# Patient Record
Sex: Male | Born: 1965
Health system: Southern US, Community
[De-identification: ages and names within clinical notes are randomized; demographics above are authoritative.]

## PROBLEM LIST (undated history)

## (undated) DIAGNOSIS — G4730 Sleep apnea, unspecified: Secondary | ICD-10-CM

## (undated) DIAGNOSIS — I509 Heart failure, unspecified: Secondary | ICD-10-CM

## (undated) DIAGNOSIS — M1990 Unspecified osteoarthritis, unspecified site: Secondary | ICD-10-CM

## (undated) DIAGNOSIS — G4733 Obstructive sleep apnea (adult) (pediatric): Secondary | ICD-10-CM

## (undated) DIAGNOSIS — I5020 Unspecified systolic (congestive) heart failure: Secondary | ICD-10-CM

## (undated) DIAGNOSIS — I10 Essential (primary) hypertension: Secondary | ICD-10-CM

## (undated) DIAGNOSIS — E119 Type 2 diabetes mellitus without complications: Secondary | ICD-10-CM

## (undated) DIAGNOSIS — E785 Hyperlipidemia, unspecified: Secondary | ICD-10-CM

## (undated) DIAGNOSIS — E876 Hypokalemia: Secondary | ICD-10-CM

## (undated) DIAGNOSIS — E669 Obesity, unspecified: Secondary | ICD-10-CM

## (undated) HISTORY — DX: Heart failure, unspecified: I50.9

## (undated) HISTORY — DX: Obstructive sleep apnea (adult) (pediatric): G47.33

## (undated) HISTORY — DX: Unspecified systolic (congestive) heart failure: I50.20

## (undated) HISTORY — DX: Essential (primary) hypertension: I10

## (undated) HISTORY — DX: Hypokalemia: E87.6

## (undated) HISTORY — DX: Type 2 diabetes mellitus without complications: E11.9

## (undated) HISTORY — PX: OTHER SURGICAL HISTORY: SHX169

## (undated) HISTORY — DX: Sleep apnea, unspecified: G47.30

## (undated) HISTORY — DX: Hyperlipidemia, unspecified: E78.5

## (undated) HISTORY — PX: UVULOPALATOPHARYNGOPLASTY: SHX827

## (undated) HISTORY — PX: TONSILLECTOMY: SUR1361

## (undated) HISTORY — DX: Obesity, unspecified: E66.9

---

## 1998-04-01 ENCOUNTER — Emergency Department (HOSPITAL_COMMUNITY): Admission: EM | Admit: 1998-04-01 | Discharge: 1998-04-01 | Payer: Self-pay | Admitting: Emergency Medicine

## 1998-09-20 ENCOUNTER — Emergency Department (HOSPITAL_COMMUNITY): Admission: EM | Admit: 1998-09-20 | Discharge: 1998-09-20 | Payer: Self-pay | Admitting: *Deleted

## 1998-09-21 ENCOUNTER — Inpatient Hospital Stay (HOSPITAL_COMMUNITY): Admission: EM | Admit: 1998-09-21 | Discharge: 1998-09-23 | Payer: Self-pay | Admitting: Emergency Medicine

## 1998-09-21 ENCOUNTER — Encounter: Payer: Self-pay | Admitting: *Deleted

## 1999-06-29 ENCOUNTER — Emergency Department (HOSPITAL_COMMUNITY): Admission: EM | Admit: 1999-06-29 | Discharge: 1999-06-30 | Payer: Self-pay

## 2000-11-13 ENCOUNTER — Encounter: Payer: Self-pay | Admitting: Emergency Medicine

## 2000-11-13 ENCOUNTER — Emergency Department (HOSPITAL_COMMUNITY): Admission: EM | Admit: 2000-11-13 | Discharge: 2000-11-13 | Payer: Self-pay | Admitting: Emergency Medicine

## 2000-12-06 ENCOUNTER — Encounter: Admission: RE | Admit: 2000-12-06 | Discharge: 2000-12-06 | Payer: Self-pay | Admitting: Internal Medicine

## 2004-07-20 ENCOUNTER — Ambulatory Visit: Payer: Self-pay | Admitting: Internal Medicine

## 2004-08-03 ENCOUNTER — Ambulatory Visit: Payer: Self-pay

## 2004-08-12 ENCOUNTER — Ambulatory Visit: Payer: Self-pay | Admitting: Pulmonary Disease

## 2004-08-18 ENCOUNTER — Ambulatory Visit (HOSPITAL_BASED_OUTPATIENT_CLINIC_OR_DEPARTMENT_OTHER): Admission: RE | Admit: 2004-08-18 | Discharge: 2004-08-18 | Payer: Self-pay | Admitting: Pulmonary Disease

## 2004-08-18 ENCOUNTER — Encounter: Payer: Self-pay | Admitting: Pulmonary Disease

## 2004-08-24 ENCOUNTER — Encounter: Payer: Self-pay | Admitting: Pulmonary Disease

## 2004-08-26 ENCOUNTER — Ambulatory Visit: Payer: Self-pay

## 2004-09-02 ENCOUNTER — Ambulatory Visit: Payer: Self-pay | Admitting: Pulmonary Disease

## 2004-09-10 ENCOUNTER — Ambulatory Visit: Payer: Self-pay | Admitting: Pulmonary Disease

## 2005-12-02 ENCOUNTER — Ambulatory Visit: Payer: Self-pay | Admitting: Internal Medicine

## 2005-12-07 ENCOUNTER — Ambulatory Visit: Payer: Self-pay | Admitting: Internal Medicine

## 2006-01-11 ENCOUNTER — Ambulatory Visit: Payer: Self-pay | Admitting: Internal Medicine

## 2007-06-25 ENCOUNTER — Emergency Department (HOSPITAL_COMMUNITY): Admission: EM | Admit: 2007-06-25 | Discharge: 2007-06-25 | Payer: Self-pay | Admitting: Family Medicine

## 2008-10-03 ENCOUNTER — Ambulatory Visit: Payer: Self-pay | Admitting: Cardiology

## 2008-10-03 ENCOUNTER — Encounter: Payer: Self-pay | Admitting: Cardiovascular Disease

## 2008-10-03 ENCOUNTER — Inpatient Hospital Stay (HOSPITAL_COMMUNITY): Admission: EM | Admit: 2008-10-03 | Discharge: 2008-10-08 | Payer: Self-pay | Admitting: Emergency Medicine

## 2008-10-03 DIAGNOSIS — I639 Cerebral infarction, unspecified: Secondary | ICD-10-CM

## 2008-10-03 HISTORY — DX: Cerebral infarction, unspecified: I63.9

## 2008-10-04 ENCOUNTER — Encounter (INDEPENDENT_AMBULATORY_CARE_PROVIDER_SITE_OTHER): Payer: Self-pay | Admitting: Internal Medicine

## 2008-10-08 ENCOUNTER — Encounter: Payer: Self-pay | Admitting: Cardiology

## 2008-10-08 HISTORY — PX: CARDIAC CATHETERIZATION: SHX172

## 2008-10-16 ENCOUNTER — Ambulatory Visit: Payer: Self-pay | Admitting: Internal Medicine

## 2008-10-16 DIAGNOSIS — I5022 Chronic systolic (congestive) heart failure: Secondary | ICD-10-CM | POA: Insufficient documentation

## 2008-10-17 LAB — CONVERTED CEMR LAB
CO2: 29 meq/L (ref 19–32)
Glucose, Bld: 106 mg/dL — ABNORMAL HIGH (ref 70–99)
Potassium: 3.7 meq/L (ref 3.5–5.1)
Sodium: 141 meq/L (ref 135–145)

## 2008-10-22 DIAGNOSIS — I428 Other cardiomyopathies: Secondary | ICD-10-CM | POA: Insufficient documentation

## 2008-10-22 DIAGNOSIS — I10 Essential (primary) hypertension: Secondary | ICD-10-CM | POA: Insufficient documentation

## 2008-10-22 DIAGNOSIS — E876 Hypokalemia: Secondary | ICD-10-CM | POA: Insufficient documentation

## 2008-10-22 DIAGNOSIS — I509 Heart failure, unspecified: Secondary | ICD-10-CM | POA: Insufficient documentation

## 2008-10-22 DIAGNOSIS — E669 Obesity, unspecified: Secondary | ICD-10-CM | POA: Insufficient documentation

## 2008-10-22 DIAGNOSIS — G4733 Obstructive sleep apnea (adult) (pediatric): Secondary | ICD-10-CM | POA: Insufficient documentation

## 2008-10-23 ENCOUNTER — Ambulatory Visit: Payer: Self-pay | Admitting: Cardiology

## 2008-10-31 ENCOUNTER — Telehealth: Payer: Self-pay | Admitting: Internal Medicine

## 2008-10-31 ENCOUNTER — Encounter: Payer: Self-pay | Admitting: Internal Medicine

## 2008-12-13 ENCOUNTER — Ambulatory Visit: Payer: Self-pay | Admitting: Internal Medicine

## 2008-12-16 LAB — CONVERTED CEMR LAB
BUN: 15 mg/dL (ref 6–23)
CO2: 27 meq/L (ref 19–32)
Chloride: 107 meq/L (ref 96–112)
Creatinine, Ser: 1.19 mg/dL (ref 0.40–1.50)
Pro B Natriuretic peptide (BNP): 74 pg/mL (ref 0.0–100.0)

## 2008-12-20 ENCOUNTER — Ambulatory Visit: Payer: Self-pay | Admitting: Internal Medicine

## 2008-12-23 LAB — CONVERTED CEMR LAB
CO2: 22 meq/L (ref 19–32)
Chloride: 106 meq/L (ref 96–112)
Creatinine, Ser: 1.06 mg/dL (ref 0.40–1.50)
Potassium: 3.6 meq/L (ref 3.5–5.3)
Sodium: 139 meq/L (ref 135–145)

## 2009-02-12 ENCOUNTER — Ambulatory Visit: Payer: Self-pay | Admitting: Internal Medicine

## 2009-02-12 ENCOUNTER — Ambulatory Visit (HOSPITAL_COMMUNITY): Admission: RE | Admit: 2009-02-12 | Discharge: 2009-02-12 | Payer: Self-pay | Admitting: Internal Medicine

## 2009-02-12 ENCOUNTER — Encounter: Payer: Self-pay | Admitting: Internal Medicine

## 2009-02-12 ENCOUNTER — Ambulatory Visit: Payer: Self-pay

## 2009-03-06 ENCOUNTER — Telehealth: Payer: Self-pay | Admitting: Internal Medicine

## 2009-03-21 ENCOUNTER — Telehealth: Payer: Self-pay | Admitting: Internal Medicine

## 2009-04-01 ENCOUNTER — Telehealth: Payer: Self-pay | Admitting: Internal Medicine

## 2009-04-14 ENCOUNTER — Telehealth: Payer: Self-pay | Admitting: Internal Medicine

## 2009-04-19 ENCOUNTER — Encounter: Payer: Self-pay | Admitting: Internal Medicine

## 2009-04-29 ENCOUNTER — Ambulatory Visit: Payer: Self-pay | Admitting: Internal Medicine

## 2009-04-29 ENCOUNTER — Ambulatory Visit (HOSPITAL_BASED_OUTPATIENT_CLINIC_OR_DEPARTMENT_OTHER): Admission: RE | Admit: 2009-04-29 | Discharge: 2009-04-29 | Payer: Self-pay | Admitting: Internal Medicine

## 2009-04-29 ENCOUNTER — Encounter: Payer: Self-pay | Admitting: Pulmonary Disease

## 2009-04-29 DIAGNOSIS — K219 Gastro-esophageal reflux disease without esophagitis: Secondary | ICD-10-CM | POA: Insufficient documentation

## 2009-05-07 ENCOUNTER — Ambulatory Visit: Payer: Self-pay | Admitting: Pulmonary Disease

## 2009-05-14 ENCOUNTER — Telehealth (INDEPENDENT_AMBULATORY_CARE_PROVIDER_SITE_OTHER): Payer: Self-pay | Admitting: *Deleted

## 2009-05-21 ENCOUNTER — Ambulatory Visit: Payer: Self-pay | Admitting: Pulmonary Disease

## 2009-05-27 ENCOUNTER — Ambulatory Visit: Payer: Self-pay | Admitting: Internal Medicine

## 2009-05-30 LAB — CONVERTED CEMR LAB
BUN: 13 mg/dL (ref 6–23)
CO2: 29 meq/L (ref 19–32)
Chloride: 105 meq/L (ref 96–112)
Creatinine, Ser: 1.2 mg/dL (ref 0.4–1.5)
Glucose, Bld: 97 mg/dL (ref 70–99)
Potassium: 3.8 meq/L (ref 3.5–5.1)

## 2009-06-03 ENCOUNTER — Encounter: Payer: Self-pay | Admitting: Pulmonary Disease

## 2009-06-11 ENCOUNTER — Ambulatory Visit: Payer: Self-pay | Admitting: Pulmonary Disease

## 2009-06-17 ENCOUNTER — Telehealth: Payer: Self-pay | Admitting: Internal Medicine

## 2009-06-21 ENCOUNTER — Encounter: Payer: Self-pay | Admitting: Internal Medicine

## 2009-07-07 ENCOUNTER — Encounter: Payer: Self-pay | Admitting: Pulmonary Disease

## 2009-07-11 ENCOUNTER — Encounter: Payer: Self-pay | Admitting: Internal Medicine

## 2009-07-22 ENCOUNTER — Encounter: Payer: Self-pay | Admitting: Internal Medicine

## 2009-07-23 ENCOUNTER — Ambulatory Visit: Payer: Self-pay | Admitting: Internal Medicine

## 2009-07-23 DIAGNOSIS — M79609 Pain in unspecified limb: Secondary | ICD-10-CM | POA: Insufficient documentation

## 2009-07-28 ENCOUNTER — Telehealth: Payer: Self-pay | Admitting: Internal Medicine

## 2009-07-28 LAB — CONVERTED CEMR LAB
BUN: 9 mg/dL (ref 6–23)
CO2: 29 meq/L (ref 19–32)
Calcium: 8.8 mg/dL (ref 8.4–10.5)
GFR calc non Af Amer: 93.63 mL/min (ref >60)
Glucose, Bld: 97 mg/dL (ref 70–99)
Sodium: 140 meq/L (ref 135–145)

## 2009-07-31 ENCOUNTER — Telehealth: Payer: Self-pay | Admitting: Internal Medicine

## 2009-07-31 ENCOUNTER — Encounter: Payer: Self-pay | Admitting: Internal Medicine

## 2009-08-01 ENCOUNTER — Ambulatory Visit (HOSPITAL_COMMUNITY): Admission: RE | Admit: 2009-08-01 | Discharge: 2009-08-02 | Payer: Self-pay | Admitting: Otolaryngology

## 2009-08-01 ENCOUNTER — Encounter (INDEPENDENT_AMBULATORY_CARE_PROVIDER_SITE_OTHER): Payer: Self-pay | Admitting: Otolaryngology

## 2009-08-08 ENCOUNTER — Encounter: Payer: Self-pay | Admitting: Internal Medicine

## 2009-08-11 ENCOUNTER — Telehealth: Payer: Self-pay | Admitting: Internal Medicine

## 2009-09-10 ENCOUNTER — Ambulatory Visit: Payer: Self-pay | Admitting: Internal Medicine

## 2009-09-22 ENCOUNTER — Encounter: Payer: Self-pay | Admitting: Internal Medicine

## 2009-09-30 ENCOUNTER — Telehealth: Payer: Self-pay | Admitting: Internal Medicine

## 2009-10-09 ENCOUNTER — Telehealth: Payer: Self-pay | Admitting: Internal Medicine

## 2009-10-21 ENCOUNTER — Encounter: Payer: Self-pay | Admitting: Internal Medicine

## 2009-10-22 ENCOUNTER — Encounter: Payer: Self-pay | Admitting: Internal Medicine

## 2009-10-28 ENCOUNTER — Observation Stay (HOSPITAL_COMMUNITY): Admission: EM | Admit: 2009-10-28 | Discharge: 2009-10-28 | Payer: Self-pay | Admitting: Emergency Medicine

## 2009-11-10 ENCOUNTER — Telehealth: Payer: Self-pay | Admitting: Internal Medicine

## 2009-11-13 ENCOUNTER — Telehealth: Payer: Self-pay | Admitting: Pulmonary Disease

## 2009-11-26 ENCOUNTER — Ambulatory Visit: Payer: Self-pay | Admitting: Internal Medicine

## 2009-11-26 ENCOUNTER — Encounter: Payer: Self-pay | Admitting: Pulmonary Disease

## 2009-11-26 DIAGNOSIS — E782 Mixed hyperlipidemia: Secondary | ICD-10-CM | POA: Insufficient documentation

## 2009-11-28 LAB — CONVERTED CEMR LAB
ALT: 31 units/L (ref 0–53)
AST: 25 units/L (ref 0–37)
Albumin: 4.2 g/dL (ref 3.5–5.2)
BUN: 16 mg/dL (ref 6–23)
Calcium: 9.8 mg/dL (ref 8.4–10.5)
Chloride: 102 meq/L (ref 96–112)
Cholesterol: 155 mg/dL (ref 0–200)
Creatinine, Ser: 1 mg/dL (ref 0.4–1.5)
Pro B Natriuretic peptide (BNP): 8.6 pg/mL (ref 0.0–100.0)
Triglycerides: 185 mg/dL — ABNORMAL HIGH (ref 0.0–149.0)
VLDL: 37 mg/dL (ref 0.0–40.0)

## 2009-12-03 ENCOUNTER — Telehealth (INDEPENDENT_AMBULATORY_CARE_PROVIDER_SITE_OTHER): Payer: Self-pay | Admitting: *Deleted

## 2009-12-16 ENCOUNTER — Telehealth (INDEPENDENT_AMBULATORY_CARE_PROVIDER_SITE_OTHER): Payer: Self-pay | Admitting: *Deleted

## 2009-12-16 ENCOUNTER — Encounter: Payer: Self-pay | Admitting: Pulmonary Disease

## 2009-12-31 ENCOUNTER — Ambulatory Visit: Payer: Self-pay

## 2009-12-31 ENCOUNTER — Ambulatory Visit (HOSPITAL_COMMUNITY): Admission: RE | Admit: 2009-12-31 | Discharge: 2009-12-31 | Payer: Self-pay | Admitting: Internal Medicine

## 2009-12-31 ENCOUNTER — Ambulatory Visit: Payer: Self-pay | Admitting: Pulmonary Disease

## 2009-12-31 ENCOUNTER — Ambulatory Visit: Payer: Self-pay | Admitting: Cardiology

## 2009-12-31 ENCOUNTER — Telehealth (INDEPENDENT_AMBULATORY_CARE_PROVIDER_SITE_OTHER): Payer: Self-pay | Admitting: *Deleted

## 2010-01-12 ENCOUNTER — Encounter: Payer: Self-pay | Admitting: Pulmonary Disease

## 2010-01-14 ENCOUNTER — Ambulatory Visit: Payer: Self-pay | Admitting: Pulmonary Disease

## 2010-03-11 ENCOUNTER — Ambulatory Visit: Payer: Self-pay | Admitting: Internal Medicine

## 2010-03-11 ENCOUNTER — Encounter: Payer: Self-pay | Admitting: Internal Medicine

## 2010-03-16 ENCOUNTER — Encounter: Payer: Self-pay | Admitting: Internal Medicine

## 2010-03-17 ENCOUNTER — Telehealth: Payer: Self-pay | Admitting: Internal Medicine

## 2010-03-19 ENCOUNTER — Encounter: Payer: Self-pay | Admitting: Internal Medicine

## 2010-05-05 NOTE — Consult Note (Signed)
Summary: [facility]   Imported By: Sherian Rein 05/21/2009 13:23:46  _____________________________________________________________________  External Attachment:    Type:   Image     Comment:   External Document

## 2010-05-05 NOTE — Progress Notes (Signed)
Summary: [facility]  Physician's Handwritten Notes/[facility]   Imported By: Sherian Rein 05/21/2009 13:30:33  _____________________________________________________________________  External Attachment:    Type:   Image     Comment:   External Document

## 2010-05-05 NOTE — Assessment & Plan Note (Signed)
Summary: rov for osa   Copy to:  Heron Sabins Primary Provider/Referring Provider:  none  CC:  Pt is here for a f/u appt.  Pt states he went and saw Dr. Annalee Genta 06-03-2009.  Pt states he is currently wearing his cpap machine every night.  Approx 6 hours per night.  Pt denied any complaints with  mask or pressue.  Marland Kitchen  History of Present Illness: The pt comes in today for f/u of his osa.  He is wearing cpap compliantly since the last visit, but dislikes it.  He does feel the new pressure setting is better.  He reports no mask leaks, and no issues with pressure.  He has seen Dr. Annalee Genta, and is going to get surgery scheduled.  Medications Prior to Update: 1)  Klor-Con M10 10 Meq Cr-Tabs (Potassium Chloride Crys Cr) .... Take 1 Tablet By Mouth Once A Day 2)  Lisinopril 40 Mg Tabs (Lisinopril) .... Take One Tablet Twice Daily 3)  Carvedilol 25 Mg Tabs (Carvedilol) .... Take One Tablet By Mouth Twice A Day 4)  Plavix 75 Mg Tabs (Clopidogrel Bisulfate) .... Take One Daily 5)  Simvastatin 40 Mg Tabs (Simvastatin) .... Take One At Bedtime 6)  Aspir-Low 81 Mg Tbec (Aspirin) .... Take One Daily 7)  Furosemide 40 Mg Tabs (Furosemide) .... Take One Tablet Daily 8)  Bidil 20-37.5 Mg Tabs (Isosorb Dinitrate-Hydralazine) .... Take 2 Tabs Three Times A Day 9)  Spironolactone 25 Mg Tabs (Spironolactone) .... Take 2 Tabs By Mouth Daily 10)  Protonix 40 Mg Tbec (Pantoprazole Sodium) .... Take 1 Tablet By Mouth Once A Day 11)  Amlodipine Besylate 2.5 Mg Tabs (Amlodipine Besylate) .... Take One Tablet By Mouth Daily  Allergies (verified): No Known Drug Allergies  Review of Systems      See HPI  Vital Signs:  Patient profile:   45 year old male Height:      69 inches Weight:      234 pounds BMI:     34.68 O2 Sat:      97 % on Room air Temp:     98.7 degrees F oral Pulse rate:   83 / minute BP sitting:   134 / 82  (right arm) Cuff size:   large  Vitals Entered By: Arman Filter LPN  (June 11, 2009 10:49 AM)  O2 Flow:  Room air CC: Pt is here for a f/u appt.  Pt states he went and saw Dr. Annalee Genta 06-03-2009.  Pt states he is currently wearing his cpap machine every night.  Approx 6 hours per night.  Pt denied any complaints with  mask or pressue.   Comments Medications reviewed with patient Arman Filter LPN  June 12, 2438 10:51 AM    Physical Exam  General:  ow male in nad Nose:  no skin breakdown or pressure necrosis from the cpap mask   Impression & Recommendations:  Problem # 1:  OBSTRUCTIVE SLEEP APNEA (ICD-327.23) the pt is doing well on cpap, but wants to consider surgery for treatment due to impact on lifestyle/QOL.  He will continue on cpap until this is done.  I have also reminded him of the important role of weight loss as well.  Other Orders: Est. Patient Level II (10272)  Patient Instructions: 1)  continue on cpap until your surgery. 2)  once given the ok by Dr. Annalee Genta, will set up for a screening study at home to see if you still have sleep apnea.

## 2010-05-05 NOTE — Letter (Signed)
Summary: missed echo appt.  Architectural technologist, Main Office  [address]   [place], [place] [address]   Phone: [phone]  Fax: [phone]        October 21, 2009 MRN: [id number]    Caleb Garcia [address] [place], [place]  [address]    Dear [name],  Our records indicate that you missed your appointment for your echocardiogram.  It is Dr Bensimhon's recommendation that you have this test done.  Please call our office at [phone] to reschedule.    Sincerely,    [name], RN Arvilla Meres, MD This letter has been electronically signed by your physician.

## 2010-05-05 NOTE — Progress Notes (Signed)
   Phone Note Other Incoming   Summary of Call: Per Olegario Messier from [facility], pt need to be stop plavix today due to surgery on 4/29. [phone]. pls call patient home # [phone]  Lorne Skeens  July 29, 2009 3:06 PM [facility] for pt to call back Katina Dung, RN, BSN   July 29, 2009 3:14 PM    Follow-up for Phone Call        ok Dolores Patty, MD, [facility]  Aug 04, 2009 5:34 PM  pt held plavix and has now restarted [name], RN  Aug 06, 2009 5:34 PM

## 2010-05-05 NOTE — Assessment & Plan Note (Signed)
Summary: per check out/sf      Allergies Added: NKDA  Visit Type:  Follow-up Referring Provider:  Heron Sabins Primary Provider:  none  CC:  no complaints.  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 Cardiac catheterization on October 08, 2008 showed no significant coronary artery disease with moderately severe global LV dysfunction. Ejection fraction 40% with moderate MR.  We last saw him in June 2011. Looked good. Echo orderd to re-assess LV function but he missed appt.   Returns for f/u. No CP, orthopnea or PND. Feels like he can do anything he wants.  Weight up seven pounds here but says weight at home only up 2 pounds. Compliant with meds.   Dyspepsia History:      There is a prior history of GERD.    New Orders:     1)  TLB-BMP (Basic Metabolic Panel-BMET) (80048-METABOL)     2)  TLB-BNP (B-Natriuretic Peptide) (83880-BNPR)     3)  TLB-Hepatic/Liver Function Pnl (80076-HEPATIC)     4)  TLB-Lipid Panel (80061-LIPID)     5)  TLB-A1C / Hgb A1C (Glycohemoglobin) (83036-A1C)     6)  Echocardiogram (Echo)   Current Medications (verified): 1)  Klor-Con M10 10 Meq Cr-Tabs (Potassium Chloride Crys Cr) .... Take 1 Tablet By Mouth Once A Day 2)  Lisinopril 40 Mg Tabs (Lisinopril) .... Take One Tablet Twice Daily 3)  Carvedilol 25 Mg Tabs (Carvedilol) .... Take One Tablet By Mouth Twice A Day 4)  Plavix 75 Mg Tabs (Clopidogrel Bisulfate) .... Take One Daily 5)  Simvastatin 40 Mg Tabs (Simvastatin) .... Take One At Bedtime 6)  Aspir-Low 81 Mg Tbec (Aspirin) .... Take One Daily 7)  Furosemide 40 Mg Tabs (Furosemide) .... Take One Tablet Daily 8)  Bidil 20-37.5 Mg Tabs (Isosorb Dinitrate-Hydralazine) .... Take 2 Tabs Three Times A Day 9)  Spironolactone 25 Mg Tabs (Spironolactone) .... Take 2 Tabs  By Mouth Daily 10)  Amlodipine Besylate 2.5 Mg Tabs (Amlodipine Besylate) .... Take One Tablet By Mouth Daily  Allergies (verified): No Known Drug Allergies  Review of Systems       As per HPI and past medical history; otherwise all systems negative.   Vital Signs:  Patient profile:   45 year old male Height:      69 inches Weight:      226 pounds BMI:     33.50 Pulse rate:   60 / minute BP sitting:   122 / 72  (left arm) Cuff size:   regular  Vitals Entered By: [name], RMA (November 26, 2009 10:06 AM)  Physical Exam  General:  well appearing. no resp difficulty HEENT: normal Neck: supple. no obvious JVD. Carotids 2+ bilat; no bruits. No lymphadenopathy or thryomegaly appreciated. Cor: PMI nondisplaced. Regular rate & rhythm. No rubs, murmur. +s4 Lungs: clear Abdomen: soft, nontender, nondistended. No hepatosplenomegaly. No bruits or masses. Good bowel sounds. Extremities: no cyanosis, clubbing, rash. no edema.  Neuro: alert & orientedx3, cranial nerves grossly intact. moves all 4 extremities w/o difficulty. affect pleasant      Impression & Recommendations:  Problem # 1:  CONGESTIVE HEART FAILURE, MILD (ICD-428.0) Volume status looks good. NYHA class I-II. Continue current regimen. Recheck echo.   Problem # 2:  HYPERTENSION (ICD-401.9) Blood pressure well controlled. Continue current regimen.  Problem # 3:  HYPERLIPIDEMIA-MIXED (ICD-272.4) Recheck  lipids and liver panel.   Other Orders: TLB-BMP (Basic Metabolic Panel-BMET) (80048-METABOL) TLB-BNP (B-Natriuretic Peptide) (83880-BNPR) TLB-Hepatic/Liver Function Pnl (80076-HEPATIC) TLB-Lipid Panel (80061-LIPID) TLB-A1C / Hgb A1C (Glycohemoglobin) (83036-A1C) Echocardiogram (Echo)   Patient Instructions: 1)  Labs today 2)  Your physician has requested that you have an echocardiogram.  Echocardiography is a painless test that uses sound waves to create images of your heart. It provides your doctor with  information about the size and shape of your heart and how well your heart's chambers and valves are working.  This procedure takes approximately one hour. There are no restrictions for this procedure. 3)  Follow up in 4 months

## 2010-05-05 NOTE — Assessment & Plan Note (Signed)
Summary: home sleep testing shows ahi 8/hr.   Copy to:  Shoemaker Primary Provider/Referring Provider:  none   History of Present Illness: the pt has undergone home sleep testing with a type 3 device for evaluation of sleep disordered breathing. I have reviewed study data and tracings with the following findings:  1) flow and oxygen saturation period of approximately 5 hours 2) the pt had 18 apneas with one being central in nature, along with 17 obstructive hypopneas.  This     gave him an AHI of 8/hr. 3) low oxygen saturation was 78% transiently, but did have 42 min less than or equal to 88%.  Allergies: No Known Drug Allergies   Impression & Recommendations:  Problem # 1:  OBSTRUCTIVE SLEEP APNEA (ICD-327.23)  the pt has very mild osa by this sleep study.  Will discuss treatment options with him.  Other Orders: Sleep Std Airflow/Heartrate and O2 SAT unattended (19147)  Appended Document: home sleep testing shows ahi 8/hr. please let pt know that he has very mild osa on his most recent home study.  This is no longer a health risk for him, and he can make the decision of staying off cpap and working on weight loss vs going back on cpap if he feels he sleeps better with the device.  I would like to see him  back in 6mos.  Appended Document: home sleep testing shows ahi 8/hr. called and spoke with pt and informed him of the results. pt verbally stated he understood the results and had no questions. pt is aware a reminder was put in the computer for his 6 month follow up.

## 2010-05-05 NOTE — Cardiovascular Report (Signed)
Summary: Heart Failure Program Summary Report  Heart Failure Program Summary Report   Imported By: Roderic Ovens 08/21/2009 12:01:37  _____________________________________________________________________  External Attachment:    Type:   Image     Comment:   External Document

## 2010-05-05 NOTE — Medication Information (Signed)
Summary: Prescription for Durable Medical Equipment   Prescription for Durable Medical Equipment   Imported By: Roderic Ovens 07/31/2009 16:33:54  _____________________________________________________________________  External Attachment:    Type:   Image     Comment:   External Document

## 2010-05-05 NOTE — Progress Notes (Signed)
Summary: refill   Phone Note Refill Request Message from:  Patient on November 10, 2009 2:50 PM  Refills Requested: Medication #1:  BIDIL 20-37.5 MG TABS take 2 tabs three times a day  Medication #2:  CARVEDILOL 25 MG TABS Take one tablet by mouth twice a day  Medication #3:  LISINOPRIL 40 MG TABS take one tablet twice daily  Medication #4:  FUROSEMIDE 40 MG TABS take one tablet daily [facility] [address]  Initial call taken by: Judie Grieve,  November 10, 2009 2:51 PM    Prescriptions: FUROSEMIDE 40 MG TABS (FUROSEMIDE) take one tablet daily  #30 x 11   Entered by:   [name], RMA   Authorized by:   Dolores Patty, MD, [facility]   Signed by:   [name], RMA on 11/11/2009   Method used:   Electronically to        [facility] (retail)       [address]       [place], [place]  [address]       Ph: [phone]       Fax: [phone]   RxID:   [id number] LISINOPRIL 40 MG TABS (LISINOPRIL) take one tablet twice daily  #60 x 11   Entered by:   [name], RMA   Authorized by:   Dolores Patty, MD, [facility]   Signed by:   [name], RMA on 11/11/2009   Method used:   Electronically to        [facility] (retail)       [address]       [place], [place]  [address]       Ph: [phone]       Fax: [phone]   RxID:   [id number] CARVEDILOL 25 MG TABS (CARVEDILOL) Take one tablet by mouth twice a day  #60 x 11   Entered by:   [name], RMA   Authorized by:   Dolores Patty, MD, [facility]   Signed by:   [name], RMA on 11/11/2009   Method used:   Electronically to        [facility] (retail)       [address]       [place], [place]  [address]       Ph: [phone]       Fax: [phone]   RxID:   [id number]

## 2010-05-05 NOTE — Progress Notes (Signed)
Summary: refill   Phone Note Refill Request   Refills Requested: Medication #1:  SIMVASTATIN 40 MG TABS take one at bedtime   Supply Requested: 3 months [facility] [address]   Method Requested: Fax to Local Pharmacy Initial call taken by: Migdalia Dk,  April 14, 2009 2:39 PM  Follow-up for Phone Call        Rx faxed to pharmacy Follow-up by: Vikki Ports,  April 14, 2009 3:08 PM    Prescriptions: SIMVASTATIN 40 MG TABS (SIMVASTATIN) take one at bedtime  #90 x 0   Entered by:   Vikki Ports   Authorized by:   Dolores Patty, MD, [facility]   Signed by:   Vikki Ports on 04/14/2009   Method used:   F[facility]ed to ...       [facility] ([facility])       [address]       [place], [place]  [address]       Ph: [phone]       Fax: [phone]   RxID:   [id number]

## 2010-05-05 NOTE — Assessment & Plan Note (Signed)
Summary: per check out/sf  Medications Added SPIRONOLACTONE 25 MG TABS (SPIRONOLACTONE) Take 2 tabs by mouth daily PROTONIX 40 MG TBEC (PANTOPRAZOLE SODIUM) Take 1 tablet by mouth once a day      Allergies Added: NKDA  Visit Type:  Follow-up  CC:  chest pain.  History of Present Illness: Caleb Garcia is a 45 year old, African American male, patient with history of nonischemic cardiomyopathy (probably hypertensive). He presented to the emergency room with chest pain, and hypertensive emergency in July 2010. Cardiac enzymes were positive and he was felt to have a non-ST elevation MI. Cardiac catheterization on October 08, 2008 showed no significant coronary artery disease with moderately severe global LV dysfunction. Ejection fraction 40% with moderate MR. Medical therapy was recommended for continued nonischemic cardiomyopathy most likely hypertensive. While in hospital he suffered an acute left parietal occipital stroke with mild blurriness in the right eye. TEE was performed and showed no evidence of thrombus or vegetation and treatment with Plavix was recommended.  Had bad episode of GERD last night. Overall doing well. Denies dyspnea, orthopnea or PND. Can do anything he wants to do. Occasional minimal CP. Weight stable. Wanting to use CPAP but company wanting to switch out machine. Taking Bidil without headaches. No palpitations.   Having sleep study tonight. Has not check BP in some time   Current Medications (verified): 1)  Klor-Con M10 10 Meq Cr-Tabs (Potassium Chloride Crys Cr) .... Take 1 Tablet By Mouth Once A Day 2)  Lisinopril 40 Mg Tabs (Lisinopril) .... Take One Tablet Twice Daily 3)  Carvedilol 25 Mg Tabs (Carvedilol) .... Take One Tablet By Mouth Twice A Day 4)  Plavix 75 Mg Tabs (Clopidogrel Bisulfate) .... Take One Daily 5)  Simvastatin 40 Mg Tabs (Simvastatin) .... Take One At Bedtime 6)  Aspir-Low 81 Mg Tbec (Aspirin) .... Take One Daily 7)  Furosemide 40 Mg Tabs (Furosemide)  .... Take One Tablet Daily 8)  Bidil 20-37.5 Mg Tabs (Isosorb Dinitrate-Hydralazine) .... Take 2 Tabs Three Times A Day 9)  Spironolactone 25 Mg Tabs (Spironolactone) .... Take One Tablet By Mouth Daily  Allergies (verified): No Known Drug Allergies  Past History:  Past Medical History: HYPOKALEMIA (ICD-276.8) CONGESTIVE HEART FAILURE, MILD (ICD-428.0)    --nonischemic EF 40%. cors normal on cath 7/10   --echo 11/10 EF 35-40% GLUCOSE INTOLERANCE, HX OF (ICD-V12.2) CARDIOMYOPATHY (ICD-425.4) OBESITY (ICD-278.00) OBSTRUCTIVE SLEEP APNEA (ICD-327.23) HYPERTENSION (ICD-401.9), severe CHRONIC SYSTOLIC HEART FAILURE (ICD-428.22)  Review of Systems       As per HPI and past medical history; otherwise all systems negative.   Vital Signs:  Patient profile:   45 year old male Height:      69 inches Weight:      235 pounds BMI:     34.83 Pulse rate:   68 / minute BP sitting:   150 / 95  (left arm) Cuff size:   regular  Vitals Entered By: Burnett Kanaris, CNA (April 29, 2009 10:27 AM)  Physical Exam  General:  Gen: well appearing. no resp difficulty HEENT: normal Neck: supple. no JVD. Carotids 2+ bilat; no bruits. No lymphadenopathy or thryomegaly appreciated. Cor: PMI nondisplaced. Regular rate & rhythm. No rubs, murmur. +s4 Lungs: clear Abdomen: soft, nontender, nondistended. No hepatosplenomegaly. No bruits or masses. Good bowel sounds. Extremities: no cyanosis, clubbing, rash, edema Neuro: alert & orientedx3, cranial nerves grossly intact. moves all 4 extremities w/o difficulty. affect pleasant    Impression & Recommendations:  Problem # 1:  CONGESTIVE HEART FAILURE, MILD (ICD-428.0)  Volume status looks good. NYHA class I-II.Continue current regimen with increase of spironolactone to 50 once daily due to HTN. Await sleep study. EF > 35% so not ICD candidate at this point.   Problem # 2:  HYPERTENSION (ICD-401.9) BP elevated. Increase spironlactone to 50 once daily.  Check BMET next week. May need norvasc.  Problem # 3:  GERD (ICD-530.81)  Start protonix 40 once daily.   His updated medication list for this problem includes:    Protonix 40 Mg Tbec (Pantoprazole sodium) .[facility]... Take 1 tablet by mouth once a day  Patient Instructions: 1)  Increase Spironolactone to 25mg  2 tabs once daily  2)  Labs in 1 week 428.22--bmet and bnp 3)  Follow up in 1 month Prescriptions: PROTONIX 40 MG TBEC (PANTOPRAZOLE SODIUM) Take 1 tablet by mouth once a day  #30 x 6   Entered by:   [name], RN   Authorized by:   Dolores Patty, MD, [facility]   Signed by:   [name], RN on 04/29/2009   Method used:   Electronically to        [facility] (retail)       [address]       [place], [place]  [address]       Ph: [phone]       Fax: [phone]   RxID:   [id number] SPIRONOLACTONE 25 MG TABS (SPIRONOLACTONE) Take 2 tabs by mouth daily  #60 x 6   Entered by:   [name], RN   Authorized by:   Dolores Patty, MD, [facility]   Signed by:   [name], RN on 04/29/2009   Method used:   Electronically to        [facility] (retail)       [address]       [place], [place]  [address]       Ph: [phone]       Fax: [phone]   RxID:   [id number] FUROSEMIDE 40 MG TABS (FUROSEMIDE) take one tablet daily  #30 x 12   Entered by:   [name], RN   Authorized by:   Dolores Patty, MD, [facility]   Signed by:   [name], RN on 04/29/2009   Method used:   Electronically to        [facility] (retail)       [address]       [place], [place]  [address]       Ph: [phone]       Fax: [phone]   RxID:   [id number] CARVEDILOL 25 MG TABS (CARVEDILOL) Take one tablet by mouth twice a day  #60 x 6   Entered by:   [name], RN   Authorized by:   Dolores Patty, MD, [facility]   Signed by:   [name], RN on 04/29/2009    Method used:   Electronically to        [facility] (retail)       [address]       [place], [place]  [address]       Ph: [phone]       Fax: [phone]   RxID:   [id number] LISINOPRIL 40 MG  TABS (LISINOPRIL) take one tablet twice daily  #60 x 6   Entered by:   [name], RN   Authorized by:   Dolores Patty, MD, [facility]   Signed by:   [name], RN on 04/29/2009   Method used:   Electronically to        [facility] (retail)       [address]       [place], [place]  [address]       Ph: [phone]       Fax: [phone]   RxID:   [id number]   Prevention & Chronic Care Immunizations   Influenza vaccine: Not documented    Tetanus booster: Not documented    Pneumococcal vaccine: Not documented  Other Screening   Smoking status: Not documented  Lipids   Total Cholesterol: Not documented   LDL: Not documented   LDL Direct: Not documented   HDL: Not documented   Triglycerides: Not documented  Hypertension   Last Blood Pressure: 150 / 95  (04/29/2009)   Serum creatinine: 1.06  (12/20/2008)   Serum potassium 3.6  (12/20/2008)  Self-Management Support :    Hypertension self-management support: Not documented

## 2010-05-05 NOTE — Cardiovascular Report (Signed)
Summary: Heart Failure Program Summary Report   Heart Failure Program Summary Report   Imported By: Roderic Ovens 12/25/2009 16:22:19  _____________________________________________________________________  External Attachment:    Type:   Image     Comment:   External Document

## 2010-05-05 NOTE — Cardiovascular Report (Signed)
Summary: Heart Failure Program Summary Report  Heart Failure Program Summary Report   Imported By: Roderic Ovens 07/22/2009 11:13:35  _____________________________________________________________________  External Attachment:    Type:   Image     Comment:   External Document

## 2010-05-05 NOTE — Cardiovascular Report (Signed)
Summary: Heart Failure Program Status Report  Heart Failure Program Status Report   Imported By: Roderic Ovens 07/18/2009 13:52:33  _____________________________________________________________________  External Attachment:    Type:   Image     Comment:   External Document

## 2010-05-05 NOTE — Assessment & Plan Note (Signed)
Summary: 2 MONTH ROV./SL      Allergies Added: NKDA  Visit Type:  Follow-up Referring Provider:  Heron Sabins Primary Provider:  none  CC:  leg pain.  History of Present Illness: Caleb Garcia is a 45 year old, African American male, patient with history of nonischemic cardiomyopathy (probably hypertensive) and previous occiptial stroke. He presented to the emergency room with chest pain, and hypertensive emergency in July 2010. Cardiac enzymes were positive and he was felt to have a non-ST elevation MI. Cardiac catheterization on October 08, 2008 showed no significant coronary artery disease with moderately severe global LV dysfunction. Ejection fraction 40% with moderate MR.  From HF standpoint doing well. Since Saturday has been having pain in his left leg. Starts in his hip and has pain and burning down entire outside of leg into calf. Feels numb. Not weak. Comes and goes. No back pain.   Breathing ok. No CP. Scheduled for UPPP not Friday for OSA. No orthopnea or PND. No edema. Scale at home weight up 4 or 5 pounds.  Current Medications (verified): 1)  Klor-Con M10 10 Meq Cr-Tabs (Potassium Chloride Crys Cr) .... Take 1 Tablet By Mouth Once A Day 2)  Lisinopril 40 Mg Tabs (Lisinopril) .... Take One Tablet Twice Daily 3)  Carvedilol 25 Mg Tabs (Carvedilol) .... Take One Tablet By Mouth Twice A Day 4)  Plavix 75 Mg Tabs (Clopidogrel Bisulfate) .... Take One Daily 5)  Simvastatin 40 Mg Tabs (Simvastatin) .... Take One At Bedtime 6)  Aspir-Low 81 Mg Tbec (Aspirin) .... Take One Daily 7)  Furosemide 40 Mg Tabs (Furosemide) .... Take One Tablet Daily 8)  Bidil 20-37.5 Mg Tabs (Isosorb Dinitrate-Hydralazine) .... Take 2 Tabs Three Times A Day 9)  Spironolactone 25 Mg Tabs (Spironolactone) .... Take 2 Tabs By Mouth Daily 10)  Protonix 40 Mg Tbec (Pantoprazole Sodium) .... Take 1 Tablet By Mouth Once A Day 11)  Amlodipine Besylate 2.5 Mg Tabs (Amlodipine Besylate) .... Take One Tablet  By Mouth Daily  Allergies (verified): No Known Drug Allergies  Past History:  Past Medical History: Last updated: 04/29/2009 HYPOKALEMIA (ICD-276.8) CONGESTIVE HEART FAILURE, MILD (ICD-428.0)    --nonischemic EF 40%. cors normal on cath 7/10   --echo 11/10 EF 35-40% GLUCOSE INTOLERANCE, HX OF (ICD-V12.2) CARDIOMYOPATHY (ICD-425.4) OBESITY (ICD-278.00) OBSTRUCTIVE SLEEP APNEA (ICD-327.23) HYPERTENSION (ICD-401.9), severe CHRONIC SYSTOLIC HEART FAILURE (ICD-428.22)  Review of Systems       As per HPI and past medical history; otherwise all systems negative.   Vital Signs:  Patient profile:   45 year old male Height:      69 inches Weight:      230 pounds BMI:     34.09 Pulse rate:   80 / minute BP sitting:   126 / 84  (left arm) Cuff size:   large  Vitals Entered By: [name], RMA (July 23, 2009 9:45 AM)  Physical Exam  General:  General:  Gen: well appearing. no resp difficulty HEENT: normal Neck: supple. no obvious JVD. Carotids 2+ bilat; no bruits. No lymphadenopathy or thryomegaly appreciated. Cor: PMI nondisplaced. Regular rate & rhythm. No rubs, murmur. +s4 Lungs: clear Abdomen: soft, nontender, nondistended. No hepatosplenomegaly. No bruits or masses. Good bowel sounds. Extremities: no cyanosis, clubbing, rash. tr edema. negative SLR bilaterally. no hot joints Neuro: alert & orientedx3, cranial nerves grossly intact. moves all 4 extremities w/o difficulty. affect pleasant      Impression & Recommendations:  Problem # 1:  CHRONIC SYSTOLIC HEART FAILURE (ICD-428.22) Overall  doing very well. Has some very mild volume overload. Take extra dose lasix/kcl for 2 days. Continue current regiment otherwise. Check BMET and BNP.  Problem # 2:  LIMB PAIN (ICD-729.5) I suspect this may be sciatica though not classic for it. Will check electrolytes and see if KCL ok. if symptoms persist refer to ortho.  Problem # 3:  HYPERTENSION (ICD-401.9) Much improved  with addition of amlodipine. Continue current regimen.   Other Orders: TLB-BMP (Basic Metabolic Panel-BMET) (80048-METABOL)  Patient Instructions: 1)  Labs today 2)  Follow up in 2 months

## 2010-05-05 NOTE — Consult Note (Signed)
Summary: [facility]  [facility]   Imported By: Lester Delco 06/16/2009 10:13:11  _____________________________________________________________________  External Attachment:    Type:   Image     Comment:   External Document

## 2010-05-05 NOTE — Progress Notes (Signed)
Summary: cpap supplies fax  Phone Note Other Incoming   Caller: [facility]-[phone] ext [phone] heather Summary of Call: looking for a fax that was sent in july cpap supplies Initial call taken by: Lacinda Axon,  November 13, 2009 1:02 PM  Follow-up for Phone Call        Aundra Millet, have you seen any forms on this pt for [facility] to sign? Pls advise, if not I will call back and have them refax Caleb Garcia  November 13, 2009 2:24 PM  received CMN and put in [facility] very important look at folder. Arman Filter LPN  November 13, 2009 2:45 PM    Additional Follow-up for Phone Call Additional follow up Details #1::        noted Additional Follow-up by: Barbaraann Share MD,  November 13, 2009 5:26 PM

## 2010-05-05 NOTE — Progress Notes (Signed)
Summary: home sleep study   [id number]  Phone Note From Other Clinic Call back at [phone] ex [phone]   Caller: [facility] ( [facility]) Call For: clance Summary of Call: calling about home sleep study. Initial call taken by: Rickard Patience,  December 31, 2009 4:45 PM  Follow-up for Phone Call        [facility].  Pt was just seen by [facility] today and order was sent to [facility] for apnea link. Unsure what they are needing.  Aundra Millet Reynolds LPN  December 31, 2009 4:52 PM   [facility] returned call asking for Almyra Free stating "this is a STAT case and needs to be addressed by the end of the business day". [phone] ex [phone]. Zackery Barefoot CMA  January 01, 2010 10:11 AM   Additional Follow-up for Phone Call Additional follow up Details #1::        faxed info to [facility] to get auth for home sleep study  Additional Follow-up by: Oneita Jolly,  January 01, 2010 4:17 PM

## 2010-05-05 NOTE — Progress Notes (Signed)
Summary: refill request   Phone Note Refill Request Message from:  Patient on October 09, 2009 9:14 AM  Refills Requested: Medication #1:  CARVEDILOL 25 MG TABS Take one tablet by mouth twice a day  Medication #2:  LISINOPRIL 40 MG TABS take one tablet twice daily  Medication #3:  AMLODIPINE BESYLATE 2.5 MG TABS Take one tablet by mouth daily.  Medication #4:  FUROSEMIDE 40 MG TABS take one tablet daily [facility]   Method Requested: Telephone to Pharmacy Initial call taken by: Glynda Jaeger,  October 09, 2009 9:17 AM    Prescriptions: AMLODIPINE BESYLATE 2.5 MG TABS (AMLODIPINE BESYLATE) Take one tablet by mouth daily  #30 x 11   Entered by:   [name], RMA   Authorized by:   Dolores Patty, MD, [facility]   Signed by:   [name], RMA on 10/09/2009   Method used:   Electronically to        [facility] (retail)       [address]       [place], [place]  [address]       Ph: [phone]       Fax: [phone]   RxID:   [id number] FUROSEMIDE 40 MG TABS (FUROSEMIDE) take one tablet daily  #30 x 11   Entered by:   [name], RMA   Authorized by:   Dolores Patty, MD, [facility]   Signed by:   [name], RMA on 10/09/2009   Method used:   Electronically to        [facility] (retail)       [address]       [place], [place]  [address]       Ph: [phone]       Fax: [phone]   RxID:   [id number] CARVEDILOL 25 MG TABS (CARVEDILOL) Take one tablet by mouth twice a day  #60 x 11   Entered by:   [name], RMA   Authorized by:   Dolores Patty, MD, [facility]   Signed by:   [name], RMA on 10/09/2009   Method used:   Electronically to        [facility] (retail)       [address]       [place], [place]  [address]       Ph: [phone]       Fax: [phone]   RxID:   [id number] LISINOPRIL 40 MG TABS (LISINOPRIL) take one tablet  twice daily  #60 x 11   Entered by:   [name], RMA   Authorized by:   Dolores Patty, MD, [facility]   Signed by:   [name], RMA on 10/09/2009   Method used:   Electronically to        [facility] (retail)       [address]       [place], [place]  [address]       Ph: [phone]       Fax: [phone]   RxID:   [id number]

## 2010-05-05 NOTE — Assessment & Plan Note (Signed)
Summary: rov for osa   Copy to:  [place] Primary Provider/Referring Provider:  none  CC:  Follow up. Pt states he has not used his cpap since he had his surgery which was on 08/01/09. Pt states he is not having any problems or concerns..  History of Present Illness: the pt comes in today for f/u of his moderate osa.  He is s/p nasal surgery and UP3 with ENT back in the early summer, and feels that he is sleeping much better since.  He states that his wife has not heard snoring, he denies fragmented sleep, and feels that he is rested in the am's upon arising.  He denies daytime sleepiness issues.  His weight has been stable since last visit.  Current Medications (verified): 1)  Klor-Con M10 10 Meq Cr-Tabs (Potassium Chloride Crys Cr) .... Take 1 Tablet By Mouth Once A Day 2)  Lisinopril 40 Mg Tabs (Lisinopril) .... Take One Tablet Twice Daily 3)  Carvedilol 25 Mg Tabs (Carvedilol) .... Take One Tablet By Mouth Twice A Day 4)  Plavix 75 Mg Tabs (Clopidogrel Bisulfate) .... Take One Daily 5)  Simvastatin 40 Mg Tabs (Simvastatin) .... Take One At Bedtime 6)  Aspir-Low 81 Mg Tbec (Aspirin) .... Take One Daily 7)  Furosemide 40 Mg Tabs (Furosemide) .... Take One Tablet Daily 8)  Bidil 20-37.5 Mg Tabs (Isosorb Dinitrate-Hydralazine) .... Take 2 Tabs Three Times A Day 9)  Spironolactone 25 Mg Tabs (Spironolactone) .... Take 2 Tabs By Mouth Daily 10)  Amlodipine Besylate 2.5 Mg Tabs (Amlodipine Besylate) .... Take One Tablet By Mouth Daily  Allergies (verified): No Known Drug Allergies  Review of Systems  The patient denies shortness of breath with activity, shortness of breath at rest, productive cough, non-productive cough, coughing up blood, chest pain, irregular heartbeats, acid heartburn, indigestion, loss of appetite, weight change, abdominal pain, difficulty swallowing, sore throat, tooth/dental problems, headaches, nasal congestion/difficulty breathing through nose, sneezing, itching,  anxiety, depression, hand/feet swelling, joint stiffness or pain, rash, change in color of mucus, and fever.    Vital Signs:  Patient profile:   45 year old male Height:      69 inches Weight:      230 pounds BMI:     34.09 O2 Sat:      99 % on Room air Temp:     98.1 degrees F oral Pulse rate:   73 / minute BP sitting:   112 / 68  (left arm) Cuff size:   regular  Vitals Entered By: Carver Fila (December 31, 2009 10:16 AM)  O2 Flow:  Room air CC: Follow up. Pt states he has not used his cpap since he had his surgery which was on 08/01/09. Pt states he is not having any problems or concerns. Comments meds and allergies updated Phone number updated Carver Fila  December 31, 2009 10:16 AM    Physical Exam  General:  ow male in nad Nose:  patent, but left mildly congested. Mouth:  trimmed palate and uvula Extremities:  no edema or cyanosis  Neurologic:  alert, not sleepy, moves all 4.   Impression & Recommendations:  Problem # 1:  OBSTRUCTIVE SLEEP APNEA (ICD-327.23) The pt has a h/o moderate osa with poor cpap tolerance.  He has had surgery this past summer, and feels that he has done well off cpap.  He denies having issues with snoring, disrupted sleep, or daytime hypersomnia.  Will need to document if he still has osa, and to what degree.  He is a perfect candidate for a home sleep study.  I have also asked the pt to work on weight loss.  Other Orders: Est. Patient Level III ([address]) Misc. Referral (Misc. Ref)  Patient Instructions: 1)  continue to work on weight loss 2)  will set up for a home sleep study to see if the surgery has adequately treated your sleep apnea.  Wil call you with results.   Immunization History:  Influenza Immunization History:    Influenza:  historical (01/03/2009)  Pneumovax Immunization History:    Pneumovax:  historical (01/03/2002)

## 2010-05-05 NOTE — Assessment & Plan Note (Signed)
Summary: 1 month rov/sl  Medications Added AMLODIPINE BESYLATE 2.5 MG TABS (AMLODIPINE BESYLATE) Take one tablet by mouth daily      Allergies Added: NKDA  Referring Provider:  Heron Sabins Primary Provider:  none  CC:  no complaints.  History of Present Illness: Caleb Garcia is a 45 year old, African American male, patient with history of nonischemic cardiomyopathy (probably hypertensive). He presented to the emergency room with chest pain, and hypertensive emergency in July 2010. Cardiac enzymes were positive and he was felt to have a non-ST elevation MI. Cardiac catheterization on October 08, 2008 showed no significant coronary artery diseas e with moderately severe global LV dysfunction. Ejection fraction 40% with moderate MR. Medical therapy was recommended for continued nonischemic cardiomyopathy most likely hypertensive. While in hospital he suffered an acute left parietal occipital stroke with mild blurriness in the right eye. TEE was performed and showed no evidence of thrombus or vegetation and treatment with Plavix was recommended.  We saw him recently and his BP was elevated. Spironolactone increased to 50 once daily. Feels good otherwise. No orthopnea, PND or edema. Taking all medications as directed.  Had sleep study recently which was +. Doesn't want CPAP so working on weight loss and getting referred to ENT fo possible UPPP.  Current Medications (verified): 1)  Klor-Con M10 10 Meq Cr-Tabs (Potassium Chloride Crys Cr) .... Take 1 Tablet By Mouth Once A Day 2)  Lisinopril 40 Mg Tabs (Lisinopril) .... Take One Tablet Twice Daily 3)  Carvedilol 25 Mg Tabs (Carvedilol) .... Take One Tablet By Mouth Twice A Day 4)  Plavix 75 Mg Tabs (Clopidogrel Bisulfate) .... Take One Daily 5)  Simvastatin 40 Mg Tabs (Simvastatin) .... Take One At Bedtime 6)  Aspir-Low 81 Mg Tbec (Aspirin) .... Take One Daily 7)  Furosemide 40 Mg Tabs (Furosemide) .... Take One Tablet Daily 8)  Bidil  20-37.5 Mg Tabs (Isosorb Dinitrate-Hydralazine) .... Take 2 Tabs Three Times A Day 9)  Spironolactone 25 Mg Tabs (Spironolactone) .... Take 2 Tabs By Mouth Daily 10)  Protonix 40 Mg Tbec (Pantoprazole Sodium) .... Take 1 Tablet By Mouth Once A Day  Allergies (verified): No Known Drug Allergies  Past History:  Past Medical History: Last updated: 04/29/2009 HYPOKALEMIA (ICD-276.8) CONGESTIVE HEART FAILURE, MILD (ICD-428.0)    --nonischemic EF 40%. cors normal on cath 7/10   --echo 11/10 EF 35-40% GLUCOSE INTOLERANCE, HX OF (ICD-V12.2) CARDIOMYOPATHY (ICD-425.4) OBESITY (ICD-278.00) OBSTRUCTIVE SLEEP APNEA (ICD-327.23) HYPERTENSION (ICD-401.9), severe CHRONIC SYSTOLIC HEART FAILURE (ICD-428.22)  Review of Systems       As per HPI and past medical history; otherwise all systems negative.   Vital Signs:  Patient profile:   46 year old male Height:      69 inches Weight:      229 pounds BMI:     33.94 Pulse rate:   75 / minute BP sitting:   140 / 90  (left arm) Cuff size:   regular  Vitals Entered By: [name], RMA (May 27, 2009 3:31 PM)  Physical Exam  General:  General:  Gen: well appearing. no resp difficulty HEENT: normal Neck: supple. no JVD. Carotids 2+ bilat; no bruits. No lymphadenopathy or thryomegaly appreciated. Cor: PMI nondisplaced. Regular rate & rhythm. No rubs, murmur. +s4 Lungs: clear Abdomen: soft, nontender, nondistended. No hepatosplenomegaly. No bruits or masses. Good bowel sounds. Extremities: no cyanosis, clubbing, rash, edema Neuro: alert & orientedx3, cranial nerves grossly intact. moves all 4 extremities w/o difficulty. affect pleasant    Impression &  Recommendations:  Problem # 1:  CHRONIC SYSTOLIC HEART FAILURE (ICD-428.22) Volume status looks good. NYHA class I-II. Continue current regimen EF > 35% so not ICD candidate at this point. Check labs today  Problem # 2:  HYPERTENSION (ICD-401.9) BP mildly elevated. Add amlodipine  2.5 once daily.  Other Orders: TLB-BMP (Basic Metabolic Panel-BMET) (80048-METABOL)  Patient Instructions: 1)  Start Amlodipine 2.5mg  daily 2)  Labs today 3)  Follow up in 2 months Prescriptions: AMLODIPINE BESYLATE 2.5 MG TABS (AMLODIPINE BESYLATE) Take one tablet by mouth daily  #30 x 6   Entered by:   [name], RN   Authorized by:   Dolores Patty, MD, [facility]   Signed by:   [name], RN on 05/27/2009   Method used:   Electronically to        [facility] (retail)       [address]       [place], [place]  [address]       Ph: [phone]       Fax: [phone]   RxID:   [id number]

## 2010-05-05 NOTE — Assessment & Plan Note (Signed)
Summary: 4 MONTH ROV/SL      Allergies Added: NKDA  Visit Type:  Follow-up Referring Provider:  Annalee Genta Primary Provider:  none  CC:  no compliants.  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 Cardiac catheterization on October 08, 2008 showed no significant coronary artery disease with moderately severe global LV dysfunction. Ejection fraction 40% with moderate MR.  Echo in 9/11 showed EF 55%.  No signifcant valvular abnormalities.  Also had home sleep study. Very mild OSA with AHI 8.  Saw Dr. Shelle Iron and decided to keep off CPAP for now.  Returns for f/u. Doing well except for recent bronchitis which is still lingering in his chest. Feels stuffy in his chest. No longer bringing up sputum. No fevers. No orthopnea or PND. Hungry all the time. Not watching diet eating at [employer] almost every other day. Compliant with meds.   Current Medications (verified): 1)  Klor-Con M10 10 Meq Cr-Tabs (Potassium Chloride Crys Cr) .... Take 1 Tablet By Mouth Once A Day 2)  Lisinopril 40 Mg Tabs (Lisinopril) .... Take One Tablet Twice Daily 3)  Carvedilol 25 Mg Tabs (Carvedilol) .... Take One Tablet By Mouth Twice A Day 4)  Plavix 75 Mg Tabs (Clopidogrel Bisulfate) .... Take One Daily 5)  Simvastatin 40 Mg Tabs (Simvastatin) .... Take One At Bedtime 6)  Aspir-Low 81 Mg Tbec (Aspirin) .... Take One Daily 7)  Furosemide 40 Mg Tabs (Furosemide) .... Take One Tablet Daily 8)  Bidil 20-37.5 Mg Tabs (Isosorb Dinitrate-Hydralazine) .... Take 2 Tabs Three Times A Day 9)  Spironolactone 25 Mg Tabs (Spironolactone) .... Take 2 Tabs By Mouth Daily 10)  Felodipine 2.5 Mg Xr24h-Tab (Felodipine) .... Take 1 Tablet By Mouth Once A Day  Allergies (verified): No Known Drug Allergies  Review of Systems       As per HPI and past  medical history; otherwise all systems negative.   Vital Signs:  Patient profile:   44 year old male Height:      69 inches Weight:      232 pounds BMI:     34.38 Pulse rate:   67 / minute BP sitting:   116 / 78  (left arm) Cuff size:   large  Vitals Entered By: [name], RMA (March 11, 2010 9:35 AM)  Physical Exam  General:  well appearing. no resp difficulty HEENT: normal Neck: supple. no obvious JVD. Carotids 2+ bilat; no bruits. No lymphadenopathy or thryomegaly appreciated. Cor: PMI nondisplaced. Regular rate & rhythm. No rubs, murmur. +s4 Lungs: clear Abdomen: soft, nontender, nondistended. No hepatosplenomegaly. No bruits or masses. Good bowel sounds. Extremities: no cyanosis, clubbing, rash. no edema.  Neuro: alert & orientedx3, cranial nerves grossly intact. moves all 4 extremities w/o difficulty. affect pleasant    Impression & Recommendations:  Problem # 1:  CONGESTIVE HEART FAILURE, MILD (ICD-428.0) Doing very well. Heart function completely recovered. No volume overload. Continue excellent regimen. Watch dietary intake.   Problem # 2:  Bronchitis Likely viral. Continue conservative management. If develops fever or recurrent sputum can consider abx.   Other Orders: EKG w/ Interpretation (93[address])  Patient Instructions: 1)  Your physician recommends that you schedule a follow-up appointment in: 4 months. 2)  Your physician recommends that you continue on your current medications as directed. Please refer to the Current Medication list given to you today.

## 2010-05-05 NOTE — Cardiovascular Report (Signed)
Summary: Heart Failure Program Status Report  Heart Failure Program Status Report   Imported By: Roderic Ovens 08/18/2009 10:28:36  _____________________________________________________________________  External Attachment:    Type:   Image     Comment:   External Document

## 2010-05-05 NOTE — Progress Notes (Signed)
Summary: form-AWAIT FAX  Phone Note Other Incoming Call back at [phone] ext [phone]   Caller: heather//[facility] Summary of Call: States she received form yesterday, but it wasn't filled out properly, pls advise. Initial call taken by: Darletta Moll,  December 03, 2009 11:27 AM  Follow-up for Phone Call        Spoke with Herbert Seta.  She states that the form that was faxed is missing a check mark next to dx code.  I advised to fax to triage faxline and I will correct and fax back.  Will await fax Follow-up by: Vernie Murders,  December 03, 2009 11:54 AM  Additional Follow-up for Phone Call Additional follow up Details #1::        Form received- checked the box that states pt has OSA and faxed back to [facility]. Additional Follow-up by: Vernie Murders,  December 03, 2009 12:07 PM

## 2010-05-05 NOTE — Progress Notes (Signed)
Summary: request for cpap -pls list pressure of machine    Phone Note From Other Clinic   Caller: heather from [facility] [phone] ext [phone] Request: Talk with Nurse Summary of Call: faxed rx request for cpap - pls list the pressure of machine . baseline sleep study. fax # [phone] Initial call taken by: Lorne Skeens,  July 28, 2009 11:58 AM  Follow-up for Phone Call        waiting to receive fax [name], RN  July 28, 2009 5:08 PM     Additional Follow-up for Phone Call Additional follow up Details #1::        [facility] calling back regarding the pt prescription and need copy of sleep study call [phone] ext [phone] [place]  July 30, 2009 1:25 PM  have placed form on cart for Dr Gala Romney to sign [name], RN  July 31, 2009 12:09 PM     Additional Follow-up for Phone Call Additional follow up Details #2::    per Dr Teddy Spike note pt is wearing cpap nightly and had surgery to correct sleep apnea on 4/29, have left mess for Herbert Seta to call back [name], RN  Aug 07, 2009 2:30 PM   spoke w/Heather explained to her pt has been wearing cpap and had surgery on 4/29 to correct so he wouldn't need cpap anymore.  Explained I have told them several times this info should come from pulm. as Dr Gala Romney does not deal w/cpap settings, she will follow up w/pt. [name], RN  Aug 11, 2009 3:52 PM

## 2010-05-05 NOTE — Progress Notes (Signed)
Summary: refill   Phone Note Refill Request   Refills Requested: Medication #1:  SIMVASTATIN 40 MG TABS take one at bedtime   Supply Requested: 3 months [facility] [address]   Method Requested: Electronic Initial call taken by: Migdalia Dk,  Aug 11, 2009 11:11 AM    Prescriptions: SIMVASTATIN 40 MG TABS (SIMVASTATIN) take one at bedtime  #90 x 3   Entered by:   [name], RMA   Authorized by:   Dolores Patty, MD, [facility]   Signed by:   [name], RMA on 08/11/2009   Method used:   Electronically to        [facility] (retail)       [address]       [place], [place]  [address]       Ph: [phone]       Fax: [phone]   RxID:   [id number]

## 2010-05-05 NOTE — Letter (Signed)
Summary: [facility]  [address]   [place], [place] [address]   Phone: [phone]  Fax: [phone]    12/16/2009  AADON GORELIK [address] [place], [place]  [address]  Dear Mr. Mothershead,   We have attempted to contact you by phone. However, the number we have on file is incorrect.  Please call our office at your earliest convenience so that we can schedule you a follow up appointment with Dr. Shelle Iron. Thank you.        Sincerely,   Barbaraann Share.D.

## 2010-05-05 NOTE — Assessment & Plan Note (Signed)
Summary: per check out/sf      Allergies Added: NKDA  Referring Provider:  Heron Sabins Primary Provider:  none  CC:  follow up.  Marland Kitchen  History of Present Illness: Caleb Garcia is a 45 year old, African American male, patient with history of probable familial nonischemic cardiomyopathy (probably hypertensive) and previous occiptial stroke. He presented to the emergency room with chest pain, and hypertensive emergency in July 2010. Cardiac enzymes were positive and he was felt to have a non-ST elevation MI. Cardiac catheterization on October 08, 2008 showed no significant coronary artery disease with moderately severe global LV dysfunction. Ejection fraction 40% with moderate MR.  We last saw him in April with mild volume overload. Lasix increased for a couple days. Also has recently undergone UPPP. Mom recently died at age 40 due to HF.   No CP, orthopnea or PND. Weight down 10 pounds. Compliant with meds.   Current Medications (verified): 1)  Klor-Con M10 10 Meq Cr-Tabs (Potassium Chloride Crys Cr) .... Take 1 Tablet By Mouth Once A Day 2)  Lisinopril 40 Mg Tabs (Lisinopril) .... Take One Tablet Twice Daily 3)  Carvedilol 25 Mg Tabs (Carvedilol) .... Take One Tablet By Mouth Twice A Day 4)  Plavix 75 Mg Tabs (Clopidogrel Bisulfate) .... Take One Daily 5)  Simvastatin 40 Mg Tabs (Simvastatin) .... Take One At Bedtime 6)  Aspir-Low 81 Mg Tbec (Aspirin) .... Take One Daily 7)  Furosemide 40 Mg Tabs (Furosemide) .... Take One Tablet Daily 8)  Bidil 20-37.5 Mg Tabs (Isosorb Dinitrate-Hydralazine) .... Take 2 Tabs Three Times A Day 9)  Spironolactone 25 Mg Tabs (Spironolactone) .... Take 2 Tabs By Mouth Daily 10)  Amlodipine Besylate 2.5 Mg Tabs (Amlodipine Besylate) .... Take One Tablet By Mouth Daily  Allergies (verified): No Known Drug Allergies  Past History:  Past Medical History: Last updated: 04/29/2009 HYPOKALEMIA (ICD-276.8) CONGESTIVE HEART FAILURE, MILD (ICD-428.0)  --nonischemic EF 40%. cors normal on cath 7/10   --echo 11/10 EF 35-40% GLUCOSE INTOLERANCE, HX OF (ICD-V12.2) CARDIOMYOPATHY (ICD-425.4) OBESITY (ICD-278.00) OBSTRUCTIVE SLEEP APNEA (ICD-327.23) HYPERTENSION (ICD-401.9), severe CHRONIC SYSTOLIC HEART FAILURE (ICD-428.22)  Review of Systems       As per HPI and past medical history; otherwise all systems negative.   Vital Signs:  Patient profile:   45 year old male Height:      69 inches Weight:      219 pounds Pulse rate:   73 / minute Pulse rhythm:   regular BP sitting:   122 / 80  (left arm) Cuff size:   large  Vitals Entered By: Judithe Modest CMA (September 10, 2009 9:29 AM)  Physical Exam  General:  General:  Gen: well appearing. no resp difficulty HEENT: normal Neck: supple. no obvious JVD. Carotids 2+ bilat; no bruits. No lymphadenopathy or thryomegaly appreciated. Cor: PMI nondisplaced. Regular rate & rhythm. No rubs, murmur. +s4 Lungs: clear Abdomen: soft, nontender, nondistended. No hepatosplenomegaly. No bruits or masses. Good bowel sounds. Extremities: no cyanosis, clubbing, rash. no edema.  Neuro: alert & orientedx3, cranial nerves grossly intact. moves all 4 extremities w/o difficulty. affect pleasant      Impression & Recommendations:  Problem # 1:  CHRONIC SYSTOLIC HEART FAILURE (ICD-428.22) Overall doing very well. Class I-II/ Volume status looks good. On good regimen. Repeat echo to reasses LV function.   Problem # 2:  HYPERTENSION (ICD-401.9) Blood pressure well controlled. Continue current regimen.  Other Orders: EKG w/ Interpretation (93[address]) Echocardiogram (Echo)  Patient Instructions: 1)  Your physician  has requested that you have an echocardiogram.  Echocardiography is a painless test that uses sound waves to create images of your heart. It provides your doctor with information about the size and shape of your heart and how well your heart's chambers and valves are working.  This procedure  takes approximately one hour. There are no restrictions for this procedure. 2)  Follow up in 2 months  Prevention & Chronic Care Immunizations   Influenza vaccine: Not documented    Tetanus booster: Not documented    Pneumococcal vaccine: Not documented  Other Screening   Smoking status: Not documented  Lipids   Total Cholesterol: Not documented   LDL: Not documented   LDL Direct: Not documented   HDL: Not documented   Triglycerides: Not documented  Hypertension   Last Blood Pressure: 122 / 80  (09/10/2009)   Serum creatinine: 1.1  (07/23/2009)   Serum potassium 3.7  (07/23/2009)  Self-Management Support :    Hypertension self-management support: Not documented

## 2010-05-05 NOTE — Progress Notes (Signed)
----   Converted from flag ---- ---- 05/14/2009 9:22 AM, Barbaraann Share MD wrote: needs a sleep consult set up with me..referring Dr. Gala Romney thanks ------------------------------  called and spoke with pt.  pt scheduled to see [facility] Wed 05-21-2009 at 9:00am.  Pt arriving at 8:45am.

## 2010-05-05 NOTE — Progress Notes (Signed)
Summary: refill meds    Phone Note Refill Request Call back at Home Phone [phone] Message from:  Patient on September 30, 2009 2:46 PM  Refills Requested: Medication #1:  KLOR-CON M10 10 MEQ CR-TABS Take 1 tablet by mouth once a day [facility] on [address]    Caryn Section Requested: Fax to Local Pharmacy Initial call taken by: Lorne Skeens,  September 30, 2009 2:46 PM Caller: Patient    Prescriptions: KLOR-CON M10 10 MEQ CR-TABS (POTASSIUM CHLORIDE CRYS CR) Take 1 tablet by mouth once a day  #30 x 11   Entered by:   [name], RMA   Authorized by:   Dolores Patty, MD, [facility]   Signed by:   [name], RMA on 09/30/2009   Method used:   Electronically to        [facility] (retail)       [address]       [place], [place]  [address]       Ph: [phone]       Fax: [phone]   RxID:   [id number]

## 2010-05-05 NOTE — Progress Notes (Signed)
Summary: refill plavix   Phone Note Refill Request Message from:  Patient on June 17, 2009 10:34 AM  Refills Requested: Medication #1:  PLAVIX 75 MG TABS take one daily [address] [phone]  Initial call taken by: Judie Grieve,  June 17, 2009 10:36 AM    Prescriptions: PLAVIX 75 MG TABS (CLOPIDOGREL BISULFATE) take one daily  #30 x 9   Entered by:   [name], RMA   Authorized by:   Dolores Patty, MD, [facility]   Signed by:   [name], RMA on 06/17/2009   Method used:   Electronically to        [facility] (retail)       [address]       [place], [place]  [address]       Ph: [phone]       Fax: [phone]   RxID:   [id number]

## 2010-05-05 NOTE — Cardiovascular Report (Signed)
Summary: Heart Failure Program Summary Report  Heart Failure Program Summary Report   Imported By: Roderic Ovens 07/03/2009 12:40:40  _____________________________________________________________________  External Attachment:    Type:   Image     Comment:   External Document

## 2010-05-05 NOTE — Cardiovascular Report (Signed)
Summary: Heart Failure Program Summary Report   Heart Failure Program Summary Report   Imported By: Roderic Ovens 10/22/2009 15:32:34  _____________________________________________________________________  External Attachment:    Type:   Image     Comment:   External Document

## 2010-05-05 NOTE — Letter (Signed)
Summary: External Correspondence  External Correspondence   Imported By: Valinda Hoar 07/07/2009 10:50:59  _____________________________________________________________________  External Attachment:    Type:   Image     Comment:   External Document

## 2010-05-05 NOTE — Progress Notes (Signed)
Summary: need ov with kc  Phone Note Outgoing Call   Call placed by: Arman Filter LPN,  December 16, 2009 2:17 PM Call placed to: Patient Summary of Call: per [facility], pt is due for a f/u appt on his OSA.  Last seen March 2011.  ATC pt at home #.  Line has been disconnected.  Therefore will send pt a letter to call our office to schedule ov with KC.  Arman Filter LPN  December 16, 2009 2:17 PM  Initial call taken by: Arman Filter LPN,  December 16, 2009 2:17 PM

## 2010-05-05 NOTE — Letter (Signed)
Summary: CPAP Supplies/[facility]  CPAP Supplies/[facility]   Imported By: Sherian Rein 12/22/2009 11:07:37  _____________________________________________________________________  External Attachment:    Type:   Image     Comment:   External Document

## 2010-05-05 NOTE — Assessment & Plan Note (Signed)
Summary: consult for osa   Copy to:  Heron Sabins Primary Provider/Referring Provider:  none  CC:  Sleep Consult.  History of Present Illness: The pt is a 45y/o male who I have been asked to see for osa.  I have seen him in the distant past for the same, where his sleep study in 2006 showed moderate disease with an AHI of 29/hr and desat as low as 70%.  The pt wished to consider UA surgery rather than cpap, and he was referred to ENT.  I have never received correspondence from that visit, but apparently the decision was made to start him on cpap.  He is unable to tell me when cpap was started, what pressure he is on, and who is dealing with his cpap equipment.  He does use full face mask that is very old, and has heated humidifier.  He currently is using cpap sporadically, and really can't give me a good reason why he can't wear.  He thinks it is primarily related to his lack of motivation to put on everynight.  When his wife makes him wear, he does feel that he sleeps better.  The pt goes to bed btw 9-10pm, and arises at 3am to start his day.  He feels rested upon arising even when he doesn't wear cpap.  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  He denies sleepiness while driving.  A recent epworth score was 5.  He does take an afternoon nap everyday due to his very early start time each day.  His weight is actually down 12 pounds from weight in 2006.  The pt has had a recent cpap titration study where his optimal pressure appears to be 12cm with a large resmed quattro full face mask.      Medications Prior to Update: 1)  Klor-Con M10 10 Meq Cr-Tabs (Potassium Chloride Crys Cr) .... Take 1 Tablet By Mouth Once A Day 2)  Lisinopril 40 Mg Tabs (Lisinopril) .... Take One Tablet Twice Daily 3)  Carvedilol 25 Mg Tabs (Carvedilol) .... Take One Tablet By Mouth Twice A Day 4)  Plavix 75 Mg Tabs (Clopidogrel Bisulfate) .... Take One Daily 5)   Simvastatin 40 Mg Tabs (Simvastatin) .... Take One At Bedtime 6)  Aspir-Low 81 Mg Tbec (Aspirin) .... Take One Daily 7)  Furosemide 40 Mg Tabs (Furosemide) .... Take One Tablet Daily 8)  Bidil 20-37.5 Mg Tabs (Isosorb Dinitrate-Hydralazine) .... Take 2 Tabs Three Times A Day 9)  Spironolactone 25 Mg Tabs (Spironolactone) .... Take 2 Tabs By Mouth Daily 10)  Protonix 40 Mg Tbec (Pantoprazole Sodium) .... Take 1 Tablet By Mouth Once A Day  Allergies (verified): No Known Drug Allergies  Past History:  Past Medical History: Reviewed history from 04/29/2009 and no changes required. HYPOKALEMIA (ICD-276.8) CONGESTIVE HEART FAILURE, MILD (ICD-428.0)    --nonischemic EF 40%. cors normal on cath 7/10   --echo 11/10 EF 35-40% GLUCOSE INTOLERANCE, HX OF (ICD-V12.2) CARDIOMYOPATHY (ICD-425.4) OBESITY (ICD-278.00) OBSTRUCTIVE SLEEP APNEA (ICD-327.23) HYPERTENSION (ICD-401.9), severe CHRONIC SYSTOLIC HEART FAILURE (ICD-428.22)  Past Surgical History: Reviewed history from 10/22/2008 and no changes required. Cardiac Cath  10/08/2008  Veverly Fells. Excell Seltzer, MD   Family History: Reviewed history from 10/22/2008 and no changes required. cancer: father (throat) sisters: DM, HTN brother: HTN mother: HTN, DM  Social History: Reviewed history from 10/22/2008 and no changes required.  The patient denies any history of smoking, alcohol, or   IV drug  use.  pt is married. pt has children. pt works for city of [employer].   Review of Systems  The patient denies shortness of breath with activity, shortness of breath at rest, productive cough, non-productive cough, coughing up blood, chest pain, irregular heartbeats, acid heartburn, indigestion, loss of appetite, weight change, abdominal pain, difficulty swallowing, sore throat, tooth/dental problems, headaches, nasal congestion/difficulty breathing through nose, sneezing, itching, ear ache, anxiety, depression, hand/feet swelling, joint stiffness or  pain, rash, change in color of mucus, and fever.    Vital Signs:  Patient profile:   45 year old male Height:      69 inches Weight:      233.13 pounds BMI:     34.55 O2 Sat:      98 % on Room air Temp:     98.0 degrees F oral Pulse rate:   80 / minute BP sitting:   124 / 76  (left arm) Cuff size:   large  Vitals Entered By: Arman Filter LPN (May 21, 2009 8:53 AM)  O2 Flow:  Room air CC: Sleep Consult Comments Medications reviewed with patient Arman Filter LPN  May 21, 2009 8:53 AM    Physical Exam  General:  ow male in nad Eyes:  PERRLA and EOMI.   Nose:  large turbinates, but patent Mouth:  large tonsils, mild enlargement of uvula, elongation of soft palate. Neck:  no jvd, tmg, LN Lungs:  clear to auscultation Heart:  rrr, no mrg Abdomen:  soft and nontender, bs+ Extremities:  no edema, no cyanosis pulses intact distally Neurologic:  alert and oriented, moves all 4.   Impression & Recommendations:  Problem # 1:  OBSTRUCTIVE SLEEP APNEA (ICD-327.23) the pt has a history of moderate osa, and currently is not on any kind of consistent therapy.  He has underlying cardiac disease that can be affected by this, and therefore I have recommended that he consider aggressive therapy while trying to lose weight.  The pt is willing to try cpap on a consistent basis, but only while he is waiting to be evaluated by ENT again for possible upper airway surgery.  He does have abnormal upper airway anatomy, and may have a good response to surgery.  He is really not over-excited about staying on cpap.  I will go ahead and have his cpap machine set to 12cm, and arrange for a new mask and supplies.  Will refer to ENT for consultation.  Other Orders: Consultation Level IV ([address]) DME Referral (DME) DME Referral (DME) ENT Referral (ENT)  Patient Instructions: 1)  will refer to ENT for possible surgery 2)  will get you a new mask, supplies, etc for your cpap machine 3)  work  on weight loss 4)  will arrange followup we me once you see throat specialist.

## 2010-05-07 NOTE — Cardiovascular Report (Signed)
Summary: [facility] CHF Program Status Report   [facility] CHF Program Status Report   Imported By: Roderic Ovens 04/02/2010 09:57:49  _____________________________________________________________________  External Attachment:    Type:   Image     Comment:   External Document

## 2010-05-07 NOTE — Letter (Signed)
Summary: Medical Examination Report  Medical Examination Report   Imported By: Marylou Mccoy 04/13/2010 16:14:06  _____________________________________________________________________  External Attachment:    Type:   Image     Comment:   External Document

## 2010-05-07 NOTE — Progress Notes (Signed)
Summary: D.O.T papers   Phone Note Call from Patient Call back at Home Phone [phone]   Caller: Patient Reason for Call: Talk to Nurse Summary of Call: Pt wants to know if his dot physical paper is done. Initial call taken by: Roe Coombs,  March 17, 2010 2:10 PM  Follow-up for Phone Call        Dr Gala Romney has completed what he could on the form, but is unable to complete section on vision/hearing, have called pt and Left message to call back [name], RN  March 17, 2010 5:57 PM   Additional Follow-up for Phone Call Additional follow up Details #1::        pt returning the call re papers pt # [phone] Roe Coombs,  March 18, 2010 8:29 AM  Left message for pt to call back. Julieta Gutting, RN, BSN  March 18, 2010 8:45 AM  I spoke with the pt and made him aware that Dr Gala Romney cannot complete the vision/hearing section of paperwork.  I made the pt aware that this could be completed by PCP or Urgent Care.  I made the pt aware that Dr Gala Romney needs to put a signature on the documentation that he has provided on form.  The pt would like  Heather RN to call him on 03/19/10 after Dr Gala Romney signs form so that he can pick-up paperwork. I will forward this message to [facility] and place the pt's paperwork back in Dr Bensimhon's folder on cart.  Additional Follow-up by: Julieta Gutting, RN, BSN,  March 18, 2010 8:54 AM    Additional Follow-up for Phone Call Additional follow up Details #2::    Dr Gala Romney signed form, pt is aware and will p/u at front desk [name], RN  March 19, 2010 1:23 PM

## 2010-06-19 ENCOUNTER — Telehealth (INDEPENDENT_AMBULATORY_CARE_PROVIDER_SITE_OTHER): Payer: Self-pay | Admitting: *Deleted

## 2010-06-20 LAB — POCT CARDIAC MARKERS
CKMB, poc: <1 ng/mL — ABNORMAL LOW (ref 1.0–8.0)
Myoglobin, poc: 44 ng/mL (ref 12–200)
Troponin i, poc: <0.05 ng/mL (ref 0.00–0.09)
Troponin i, poc: <0.05 ng/mL (ref 0.00–0.09)

## 2010-06-20 LAB — BRAIN NATRIURETIC PEPTIDE: Pro B Natriuretic peptide (BNP): <30 pg/mL (ref 0.0–100.0)

## 2010-06-20 LAB — POCT I-STAT, CHEM 8
BUN: 16 mg/dL (ref 6–23)
Creatinine, Ser: 1.8 mg/dL — ABNORMAL HIGH (ref 0.4–1.5)
Glucose, Bld: 88 mg/dL (ref 70–99)
Potassium: 4.2 mEq/L (ref 3.5–5.1)
Sodium: 136 mEq/L (ref 135–145)

## 2010-06-22 ENCOUNTER — Telehealth (INDEPENDENT_AMBULATORY_CARE_PROVIDER_SITE_OTHER): Payer: Self-pay | Admitting: *Deleted

## 2010-06-23 LAB — BASIC METABOLIC PANEL
Chloride: 105 mEq/L (ref 96–112)
Creatinine, Ser: 1.24 mg/dL (ref 0.4–1.5)
GFR calc Af Amer: >60 mL/min (ref >60)
Potassium: 3.7 mEq/L (ref 3.5–5.1)
Sodium: 138 mEq/L (ref 135–145)

## 2010-06-23 LAB — CBC
HCT: 37 % — ABNORMAL LOW (ref 39.0–52.0)
Hemoglobin: 12.8 g/dL — ABNORMAL LOW (ref 13.0–17.0)
MCV: 92.6 fL (ref 78.0–100.0)
RBC: 3.99 MIL/uL — ABNORMAL LOW (ref 4.22–5.81)
WBC: 4.9 10*3/uL (ref 4.0–10.5)

## 2010-06-23 LAB — GLUCOSE, CAPILLARY: Glucose-Capillary: 163 mg/dL — ABNORMAL HIGH (ref 70–99)

## 2010-06-23 LAB — MRSA PCR SCREENING: MRSA by PCR: NEGATIVE

## 2010-06-23 LAB — PROTIME-INR: Prothrombin Time: 12.6 seconds (ref 11.6–15.2)

## 2010-06-23 NOTE — Progress Notes (Signed)
  Phone Note Other Incoming   Request: Send information Summary of Call: Request for records received from [facility]. Request forwarded to [facility].     

## 2010-07-02 NOTE — Progress Notes (Signed)
  Phone Note Other Incoming   Request: Send information Summary of Call: Request for records received from [facility]. Request forwarded to [facility].  Last 5 yrs-Primary Care

## 2010-07-12 LAB — RAPID URINE DRUG SCREEN, HOSP PERFORMED
Amphetamines: NOT DETECTED
Cocaine: NOT DETECTED
Opiates: NOT DETECTED
Tetrahydrocannabinol: NOT DETECTED

## 2010-07-12 LAB — TROPONIN I
Troponin I: 0.02 ng/mL (ref 0.00–0.06)
Troponin I: 0.81 ng/mL (ref 0.00–0.06)
Troponin I: 1.3 ng/mL (ref 0.00–0.06)

## 2010-07-12 LAB — CARDIAC PANEL(CRET KIN+CKTOT+MB+TROPI)
CK, MB: 15 ng/mL — ABNORMAL HIGH (ref 0.3–4.0)
CK, MB: 2.9 ng/mL (ref 0.3–4.0)
Relative Index: 3.1 — ABNORMAL HIGH (ref 0.0–2.5)
Relative Index: 3.6 — ABNORMAL HIGH (ref 0.0–2.5)
Total CK: 254 U/L — ABNORMAL HIGH (ref 7–232)
Total CK: 307 U/L — ABNORMAL HIGH (ref 7–232)
Troponin I: 0.85 ng/mL (ref 0.00–0.06)
Troponin I: 0.91 ng/mL (ref 0.00–0.06)
Troponin I: 1.43 ng/mL (ref 0.00–0.06)

## 2010-07-12 LAB — BASIC METABOLIC PANEL
BUN: 15 mg/dL (ref 6–23)
BUN: 9 mg/dL (ref 6–23)
CO2: 22 mEq/L (ref 19–32)
CO2: 24 mEq/L (ref 19–32)
Chloride: 105 mEq/L (ref 96–112)
Chloride: 106 mEq/L (ref 96–112)
Creatinine, Ser: 0.9 mg/dL (ref 0.4–1.5)
Potassium: 3.3 mEq/L — ABNORMAL LOW (ref 3.5–5.1)
Potassium: 4 mEq/L (ref 3.5–5.1)

## 2010-07-12 LAB — LIPID PANEL
LDL Cholesterol: 165 mg/dL — ABNORMAL HIGH (ref 0–99)
Total CHOL/HDL Ratio: 5.8 RATIO
VLDL: 32 mg/dL (ref 0–40)

## 2010-07-12 LAB — COMPREHENSIVE METABOLIC PANEL
Albumin: 3.6 g/dL (ref 3.5–5.2)
BUN: 12 mg/dL (ref 6–23)
Calcium: 9 mg/dL (ref 8.4–10.5)
Creatinine, Ser: 1.06 mg/dL (ref 0.4–1.5)
Glucose, Bld: 105 mg/dL — ABNORMAL HIGH (ref 70–99)
Potassium: 4 mEq/L (ref 3.5–5.1)
Total Protein: 7.2 g/dL (ref 6.0–8.3)

## 2010-07-12 LAB — HEMOGLOBIN A1C: Mean Plasma Glucose: 120 mg/dL

## 2010-07-12 LAB — CBC
HCT: 38.5 % — ABNORMAL LOW (ref 39.0–52.0)
HCT: 40.4 % (ref 39.0–52.0)
Hemoglobin: 12.8 g/dL — ABNORMAL LOW (ref 13.0–17.0)
Hemoglobin: 13 g/dL (ref 13.0–17.0)
Hemoglobin: 13.6 g/dL (ref 13.0–17.0)
Hemoglobin: 13.9 g/dL (ref 13.0–17.0)
MCHC: 33.3 g/dL (ref 30.0–36.0)
MCHC: 33.7 g/dL (ref 30.0–36.0)
MCHC: 34.5 g/dL (ref 30.0–36.0)
MCHC: 34.7 g/dL (ref 30.0–36.0)
MCV: 89.2 fL (ref 78.0–100.0)
MCV: 89.5 fL (ref 78.0–100.0)
Platelets: 168 10*3/uL (ref 150–400)
Platelets: 171 10*3/uL (ref 150–400)
Platelets: 187 10*3/uL (ref 150–400)
RBC: 4.3 MIL/uL (ref 4.22–5.81)
RBC: 4.32 MIL/uL (ref 4.22–5.81)
RDW: 14 % (ref 11.5–15.5)
RDW: 14.5 % (ref 11.5–15.5)
WBC: 6.8 10*3/uL (ref 4.0–10.5)

## 2010-07-12 LAB — TSH: TSH: 0.797 u[IU]/mL (ref 0.350–4.500)

## 2010-07-12 LAB — HEPARIN LEVEL (UNFRACTIONATED): Heparin Unfractionated: 0.38 IU/mL (ref 0.30–0.70)

## 2010-07-12 LAB — PROTIME-INR
INR: 1.1 (ref 0.00–1.49)
Prothrombin Time: 14 seconds (ref 11.6–15.2)

## 2010-07-12 LAB — PHOSPHORUS: Phosphorus: 3 mg/dL (ref 2.3–4.6)

## 2010-07-12 LAB — MAGNESIUM
Magnesium: 2.1 mg/dL (ref 1.5–2.5)
Magnesium: 2.3 mg/dL (ref 1.5–2.5)

## 2010-07-12 LAB — BRAIN NATRIURETIC PEPTIDE: Pro B Natriuretic peptide (BNP): 418 pg/mL — ABNORMAL HIGH (ref 0.0–100.0)

## 2010-07-12 LAB — SEDIMENTATION RATE: Sed Rate: 20 mm/hr — ABNORMAL HIGH (ref 0–16)

## 2010-07-12 LAB — CK TOTAL AND CKMB (NOT AT ARMC): Relative Index: 0.8 (ref 0.0–2.5)

## 2010-07-13 LAB — POCT CARDIAC MARKERS
CKMB, poc: <1 ng/mL — ABNORMAL LOW (ref 1.0–8.0)
Troponin i, poc: <0.05 ng/mL (ref 0.00–0.09)

## 2010-07-13 LAB — POCT I-STAT, CHEM 8
Calcium, Ion: 1.09 mmol/L — ABNORMAL LOW (ref 1.12–1.32)
HCT: 44 % (ref 39.0–52.0)
Hemoglobin: 15 g/dL (ref 13.0–17.0)
TCO2: 22 mmol/L (ref 0–100)

## 2010-07-13 LAB — BRAIN NATRIURETIC PEPTIDE: Pro B Natriuretic peptide (BNP): 313 pg/mL — ABNORMAL HIGH (ref 0.0–100.0)

## 2010-07-23 ENCOUNTER — Ambulatory Visit: Payer: Self-pay | Admitting: Internal Medicine

## 2010-07-29 ENCOUNTER — Encounter: Payer: Self-pay | Admitting: Internal Medicine

## 2010-07-30 ENCOUNTER — Encounter: Payer: Self-pay | Admitting: Internal Medicine

## 2010-07-30 ENCOUNTER — Ambulatory Visit (INDEPENDENT_AMBULATORY_CARE_PROVIDER_SITE_OTHER): Payer: 59 | Admitting: Internal Medicine

## 2010-07-30 VITALS — BP 116/76 | HR 64 | Ht 70.0 in | Wt 237.0 lb

## 2010-07-30 DIAGNOSIS — I428 Other cardiomyopathies: Secondary | ICD-10-CM

## 2010-07-30 MED ORDER — SILDENAFIL CITRATE 100 MG PO TABS: 100.0000 mg | ORAL_TABLET | Freq: Every day | ORAL | Status: DC | PRN

## 2010-07-30 MED ORDER — HYDRALAZINE HCL 25 MG PO TABS
ORAL_TABLET | ORAL | Status: DC
Start: 2010-07-30 — End: 2010-12-25

## 2010-07-30 NOTE — Patient Instructions (Addendum)
Stop Bidil Stop Plavix Start Hydralazine 1 & 1/2 tabs Three times a day Viagra 100mg  as needed Your physician wants you to follow-up in: 6 months. You will receive a reminder letter in the mail two months in advance. If you don't receive a letter, please call our office to schedule the follow-up appointment.

## 2010-07-30 NOTE — Progress Notes (Signed)
HPI:  Caleb Garcia is a 45 year old, African American male, patient with history of HTN, OSA s/p UPPP and probable familial nonischemic cardiomyopathy (probably hypertensive) and previous occiptial stroke. Cardiac catheterization on October 08, 2008 showed no significant coronary artery disease with moderately severe global LV dysfunction. Ejection fraction 40% with moderate MR.  Echo in 9/11 showed EF 55%.  No signifcant valvular abnormalities. Also had home sleep study. Very mild OSA with AHI 8.  Saw Dr. Shelle Iron and decided to keep off CPAP for now.  Remains active. Says breathing is fine. No CP. Main complaints are sexual dysfunction which he relates to switching from amlodipine to felodipine. . And shooting pains down his R leg. No orthopnea or PND.    ROS: All systems negative except as listed in HPI, PMH and Problem List.  Past Medical History  Diagnosis Date  . Hypokalemia   . Systolic CHF     mon ischemic  . Glucose intolerance (impaired glucose tolerance)   . Cardiomyopathy   . Obesity   . OSA (obstructive sleep apnea)   . HTN (hypertension)     Current Outpatient Prescriptions  Medication Sig Dispense Refill  . aspirin 81 MG tablet Take 81 mg by mouth daily.        . carvedilol (COREG) 25 MG tablet Take 25 mg by mouth 2 (two) times daily with a meal.        . clopidogrel (PLAVIX) 75 MG tablet Take 75 mg by mouth daily.        . felodipine (PLENDIL) 2.5 MG 24 hr tablet Take 2.5 mg by mouth daily.        . furosemide (LASIX) 40 MG tablet Take 40 mg by mouth daily.        . isosorbide-hydrALAZINE (BIDIL) 20-37.5 MG per tablet Take 2 tablets by mouth 3 (three) times daily.        Marland Kitchen lisinopril (PRINIVIL,ZESTRIL) 40 MG tablet Take 40 mg by mouth 2 (two) times daily.        . potassium chloride (KLOR-CON) 10 MEQ CR tablet Take 10 mEq by mouth daily.        . simvastatin (ZOCOR) 40 MG tablet Take 40 mg by mouth at bedtime.        Marland Kitchen spironolactone (ALDACTONE) 25 MG tablet Take 50 mg by mouth  daily.           PHYSICAL EXAM: Filed Vitals:   07/30/10 1246  BP: 116/76  Pulse: 64   General:  Well appearing. No resp difficulty HEENT: normal Neck: supple. JVP flat. Carotids 2+ bilaterally; no bruits. No lymphadenopathy or thryomegaly appreciated. Cor: PMI normal. Regular rate & rhythm. No rubs, gallops or murmurs. Lungs: clear Abdomen: obese soft, nontender, nondistended. No hepatosplenomegaly. No bruits or masses. Good bowel sounds. Extremities: no cyanosis, clubbing, rash, edema Neuro: alert & orientedx3, cranial nerves grossly intact. Moves all 4 extremities w/o difficulty. R and L lower extremities equal strength. - SLR Affect pleasant.    ECG: NSR 64 1avb ( ) diffuse non-specific TWI   ASSESSMENT & PLAN:

## 2010-07-30 NOTE — Assessment & Plan Note (Signed)
LV function has normalized. Given that we are starting Viagra we will have to stop Bidil and just treat with hydralazine. Continue carvedilol.

## 2010-08-03 ENCOUNTER — Inpatient Hospital Stay (INDEPENDENT_AMBULATORY_CARE_PROVIDER_SITE_OTHER)
Admission: RE | Admit: 2010-08-03 | Discharge: 2010-08-03 | Disposition: A | Discharge status: Home / Self Care | Payer: 59 | Source: Ambulatory Visit

## 2010-08-03 ENCOUNTER — Ambulatory Visit (INDEPENDENT_AMBULATORY_CARE_PROVIDER_SITE_OTHER): Payer: 59

## 2010-08-03 DIAGNOSIS — M545 Low back pain, unspecified: Secondary | ICD-10-CM

## 2010-08-18 NOTE — Discharge Summary (Signed)
NAME:  Caleb Garcia, Caleb Garcia              ACCOUNT NO.:  [id number]   MEDICAL RECORD NO.:  [id number]          PATIENT TYPE:  INP   LOCATION:  4728                         FACILITY:  [facility]   PHYSICIAN:  Charlestine Massed, MDDATE OF BIRTH:  1965-12-30   DATE OF ADMISSION:  10/02/2008  DATE OF DISCHARGE:                               DISCHARGE SUMMARY   PRIMARY CARE PHYSICIAN:  The patient is expected to see Dr. Della Goo.   CARDIOLOGIST:  Bevelyn Buckles. Bensimhon, MD, of [facility].   REASON FOR ADMISSION:  Worsening shortness of breath.   DISCHARGE DIAGNOSES:  1. Nonischemic cardiomyopathy with congestive heart failure and      ejection fraction of 25%.  2. Obstructive sleep apnea.  3. Severe hypertension, currently controlled.  4. Obesity.  5. Glucose intolerance.   DISCHARGE MEDICATIONS:  1. Aspirin 81 mg p.o. daily.  2. Coreg 12.5 mg p.o. b.i.d.  3. Lisinopril 40 mg p.o. b.i.d.  4. BiDil 37.5/20 one tablet p.o. t.i.d.  5. Potassium chloride 10 mEq p.o. daily.  6. Lasix 40 mg p.o. daily in a.m.   HOSPITAL COURSE:  1. Cardiac - CHF, hypertension, nonischemic cardiomyopathy, non-STEMI      - type 2:  The patient was admitted with worsening shortness of      breath over a period of 2-3 weeks.  The patient states that he      never had any cardiac problems in the past.  His BNP was 313 at the      time of admission.  He had an echocardiogram in 2006, which showed      an EF of 45-50%.  He was seen by Dr. Gala Romney in the [facility]      Cardiology office.  He has a diagnosis of severe sleep apnea.  He      is very noncompliant with his CPAP machine.  Also, he is much      noncompliant with his hypertension medications.  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   The patient was admitted to telemetry and evaluated.  He was found to  have clinical heart failure and the echocardiogram was done  on June 2,  which revealed an ejection fraction of 25% and marked global hypokinesis  and mild-to-moderate mitral regurgitation also present, dilated LA was  present.  There was no tricuspid TR jet and not much evidence of any  pulmonary hypertension could be evaluated.   The patient currently had one episode of chest pain after which he had  an elevation in the troponins, which went up to as high as 1.03 with  marked dynamic cardiac EKG changes involving the lateral and anterior as  well as inferior walls.  In view of this, the patient has been diagnosed  with non-STEMI, so Cardiology saw the patient and took him for cardiac  catheterization right away.   Cardiac cath revealed normal coronary arteries with global hypokinesis  with an ejection fraction of 25% as per  the cardiologist in the  patient's nonischemic dilated cardiomyopathy with hypertension,  hypertensive crisis, there can be marked dynamic EKG changes simulating  an MI.   The patient after catheterization was observed on telemetry.  He had 1  episode of nonsustained V-TACH of 5 beats at a rate of 150 per minute.  Cardiology is aware of that and they said to continue the beta-blockers.  The patient was seen by Dr. Gala Romney and his office will call and make  an appointment for him and he will be seen by Electrophysiology at the  time in his office for further evaluation of his heart cardiac  arrhythmias.   The patient to be continued on Lasix, lisinopril, and Coreg.  In view of  his high blood pressure, if it is still not controlled with b.i.d.  lisinopril, he has been started on BiDil 37.5/20 one tablet p.o. t.i.d.,  which is well known to have a beneficial effect in heart failure,  especially in African American patient.  He will be continued on aspirin  81 mg p.o. daily and Coreg 12.5 mg p.o. b.i.d. and Lasix 40 mg p.o.  daily with potassium chloride supplement of 10 mEq p.o. daily, and to  follow up Cardiology as  outpatient.   1. Sleep apnea.  The patient has had considerable relief and good      sleep pattern with the CPAP machine.  The patient really has this      machine at home and he has been issued a mask from the hospital to      take at home to continue the CPAP at home.  He already has a      diagnosis of moderate sleep apnea by polysomnography done in May      2006.  2. Obesity.  The patient has been advised about losing weight      gradually.  3. Diet.  The patient has been advised to follow strictly a 2-g      sodium, low-fat heart-healthy diet and fluid limitations to 1.2-1.4      L of fluid a day.  The patient has exhibited understanding.   DISPOSITION:  Discharged back to home.   FOLLOWUP:  1. Follow up with Dr. Gala Romney in 1 week, however, his office will      call to make the appointment.  2. He will follow up with Dr. Della Goo, PMD, in 1 week to      check BMET as the patient is on Lasix.   The patient has been fully educated on CHF, about fluid intake, and  daily weights, and he has exhibited understanding.   A total of 40 minutes was spent on this discharge.      Charlestine Massed, MD  Electronically Signed     UT/MEDQ  D:  10/05/2008  T:  10/06/2008  Job:  [id number]   cc:   Della Goo, M.D.  Bevelyn Buckles. Bensimhon, MD

## 2010-08-18 NOTE — Consult Note (Signed)
NAME:  Caleb Garcia, Caleb Garcia NO.:  [id number]   MEDICAL RECORD NO.:  [id number]          PATIENT TYPE:  INP   LOCATION:                               FACILITY:  [facility]   PHYSICIAN:  Caleb Garcia, MDDATE OF BIRTH:  [birth year]   DATE OF CONSULTATION:  10/03/2008  DATE OF DISCHARGE:                                 CONSULTATION   PRIMARY CARE Caleb Garcia:  No primary care at this time.   CARDIOLOGIST:  Caleb Buckles. Bensimhon, MD   Caleb Garcia is a 45 year old African American gentleman, seen by Dr.  Gala Garcia remotely in 2007 to establish care for nonischemic  cardiomyopathy, EF of 45-50% with a negative stress echocardiogram.  In  the setting of poorly controlled hypertension, severe obstructive sleep  apnea would noncompliance to CPAP.  Dr. Gala Garcia started the patient on  ACE inhibitor at that time.  The patient never followed up.  He states  he has been doing quite well, has not had any problems, recently got  married, was on his honeymoon, noticed he was a little more short of  breath with activity, as in climbing steps.  He returned home to work  and doing some lifting.  He drives a truck for living.  He noticed that  once again he was getting short of breath.  They did a lot of heavy  lifting.  He denied any chest discomfort, orthopnea, PND, increased  weight, or edema.  He is complaining of a headache at times.  There is  partial compliance to medication.  He states he ran out of his  lisinopril recently and has not taken any since that time.  He has been  planning on reestablishing the care with Dr. Gala Garcia but then got more  short of breath, so he decided he needs to get it evaluated.  His wife  states he does not wear the CPAP, and she has never seen him wearing the  CPAP.  In the ER here, his blood pressure was 160s/120 to 124, heart  rate in the 90s.  ER documentation states the patient complained of  chest discomfort.  The only chest discomfort the patient  complained of  today is a sharp stabbing pain across his left breast earlier today.  He  denies any heaviness or tightness.  He did say he felt he had a chest  cold, but he denies any fever, chills, or cough and that symptoms of the  chest cold are increased shortness of breath.  In the ER, he received  Lasix, enalapril, nitroglycerin 1 inch, and potassium with resolution of  symptoms, and he was diuresed.   PAST MEDICAL HISTORY:  1. Ongoing noncompliance.  2. Obstructive sleep apnea with noncompliance to CPAP.  3. Hypertension, uncontrolled.  4. Nonischemic cardiomyopathy, EF 45-50%.  5. Obesity.  6. Borderline diabetes, although the patient denies a stress echo in      2006 with no wall motion abnormalities.   No known drug allergies.   MEDICATIONS AT HOME:  Lisinopril 40 mg.  Here, he is on Cardizem 180,  potassium, labetalol p.r.n., Prinivil, and Lovenox.  SOCIAL HISTORY:  The patient is married, 2 children from previous  marriage, lives in [place], works for the city driving a truck.  He  denies any tobacco, EtOH, or illicit substance use.   FAMILY HISTORY:  Positive for diabetes, hypertension, end-stage renal  disease in the mother, father deceased from throat cancer, and he has a  sister up on 70, 100 now who is a renal patient and has some  questionable problems with osteomyelitis.   REVIEW OF SYSTEMS:  As per HPI, otherwise, all systems reviewed and  negative.   PERTINENT LABORATORY DATA:  Sodium 137, potassium 3.3, BUN 9, creatinine  0.9, glucose 102, magnesium 2.1.  Cardiac enzymes, negative x2.  Chest x-  ray showing borderline cardiomegaly.  EKG, sinus rhythm with T-wave  inversions in the lateral leads.  I do not see a formal CBC, but there  was a  i-STAT drawn in the ER.  Hemoglobin of 15, hematocrit of 44.   PHYSICAL EXAMINATION:  VITAL SIGNS:  Temperature 97.6, heart rate is 90,  respirations 18, blood pressure 152/110, 100% on room air.  GENERAL:  A  well-nourished 45 year old [place] American male, in no  acute distress.  HEENT:  Unremarkable.  NECK:  Supple without lymphadenopathy, bruits, or JVD.  CARDIOVASCULAR:  S1 and S2.  Soft S4.  Regular rate and rhythm.  LUNGS:  Clear to auscultation bilaterally.  SKIN:  Warm and dry.  ABDOMEN:  Soft, nontender, positive bowel sounds.  LOWER EXTREMITIES:  No clubbing, cyanosis, or edema.  NEUROLOGIC:  Alert and oriented x3.   IMPRESSION:  1. Mild congestive heart failure exacerbation with a history of      nonischemic cardiomyopathy.  2. Congestive heart failure exacerbation in the setting of      hypertensive urgency with ongoing noncompliance to medication and      continuous positive airway pressure.  3. Questionable diabetes.   We will proceed with echocardiogram.  I would recommend discontinuing  the calcium channel blocker.  We will start the patient on carvedilol,  increase his ACE inhibitor for optimal blood pressure control.  We use  hydralazine p.r.n. to keep systolic less than 180.  Check a hemoglobin  A1c.  We will schedule outpatient stress Myoview.  Further workup  depending on results of echocardiogram.  Dr. Verne Garcia has  been into examine and assessed the patient and agrees with the plan of  care.      Caleb Garcia, ACNP      Caleb Carrow, MD  Electronically Signed    MB/MEDQ  D:  10/03/2008  T:  10/04/2008  Job:  [id number]

## 2010-08-18 NOTE — Cardiovascular Report (Signed)
NAME:  RONNELL, CLINGER              ACCOUNT NO.:  [id number]   MEDICAL RECORD NO.:  [id number]          PATIENT TYPE:  INP   LOCATION:  4728                         FACILITY:  [facility]   PHYSICIAN:  Erling Arrazola. Excell Seltzer, MD  DATE OF BIRTH:  May 24, 1965   DATE OF PROCEDURE:  10/08/2008  DATE OF DISCHARGE:  10/08/2008                            CARDIAC CATHETERIZATION   PROCEDURE:  Left heart catheterization, selective coronary angiography,  left ventricular angiography.   PROCEDURAL INDICATIONS:  Mr. Gagen is a gentleman with a history of  nonischemic cardiomyopathy.  It has been several years since he has had  a cardiac catheterization.  He presented with chest pain and  hypertensive emergency.  He also has complained of a headache over the  24 hours preceding his cath.  His cardiac enzymes were positive, and he  was referred for diagnostic catheterization to evaluate his coronary  anatomy in the setting of a non-ST-elevation myocardial infarction.   The patient complained of a headache on the cath table with blurry  vision.  At that time, his systolic blood pressure was greater than 200,  and his diastolic blood pressure was greater than 100.  He was given 20  mg of intravenous labetalol.  He was also given low dose IV analgesics.  I reviewed his case with the hospitalist.  His symptoms resolved after  control of his blood pressure.  I decided to proceed with cardiac  catheterization after further review with the hospitalist.   Risks and indications of procedure were reviewed, and informed consent  was obtained.  The right groin was prepped and anesthetized with 1%  lidocaine.  Using a modified Seldinger technique, a 5-French sheath was  placed in the right femoral artery.  Standard 5-French Judkins catheters  were used for coronary angiography and left ventriculography.  The  patient tolerated the procedure well.  There were no immediate  complications.  All catheter exchanges were  performed over a guidewire.   Coronary angiography:  Left mainstem:  Widely patent.  No significant  obstructive disease.  Trifurcates into LAD, intermediate branch, and  left circumflex.   LAD:  The LAD is a large-caliber vessel that reaches the LV apex.  It is  smooth throughout its course with no significant stenoses present.  The  LAD wraps around the apex.  There is a large first diagonal branch that  is widely patent.   Left circumflex:  There is an intermediate branch that bifurcates into  twin vessels.  There is no significant stenosis.  The intermediate  branch is relatively large.  The AV groove circumflex courses down and  supplies 2 posterolateral branches.  There is no significant stenosis in  the left circumflex system.   Right coronary artery:  The RCA is dominant.  There is no significant  stenosis present.  There is a moderate-sized acute marginal branch as  well as the PDA and single posterolateral branch.  There are no  significant stenoses in the branch vessels of the right coronary artery.   Left ventriculography.  There is moderate global LV systolic  dysfunction.  There also  appears to be moderate mitral regurgitation.  The estimated LVEF is 40%.   HEMODYNAMIC RESULTS:  Aortic pressure 123/76 with a mean of 98.   Left ventricular pressure is 129/18.   ASSESSMENT:  1. No significant coronary artery disease.  2. Moderately severe global left ventricular systolic dysfunction.  3. Normal left ventricular hemodynamics.  4. Moderate mitral regurgitation.   RECOMMENDATIONS:  I recommend continued therapy for the patient's  nonischemic cardiomyopathy.  He likely has hypertensive cardiomyopathy.      Veverly Fells. Excell Seltzer, MD  Electronically Signed     MDC/MEDQ  D:  10/08/2008  T:  10/08/2008  Job:  [id number]

## 2010-08-18 NOTE — H&P (Signed)
NAME:  Caleb Garcia, Caleb Garcia              ACCOUNT NO.:  [id number]   MEDICAL RECORD NO.:  [id number]          PATIENT TYPE:  INP   LOCATION:  1824                         FACILITY:  [facility]   PHYSICIAN:  Jude Ojie, MD          DATE OF BIRTH:  1966-03-19   DATE OF ADMISSION:  10/03/2008  DATE OF DISCHARGE:                              HISTORY & PHYSICAL   CHIEF COMPLAINT:  Progressively worsening shortness of breath and some  slight chest tightness.   HISTORY OF PRESENTING COMPLAINT:  The patient is a 45 year old with  history of hypertension, obstructive sleep apnea, noncompliance with his  medications and CPAP machine and also has a history of nonischemic  cardiomyopathy with an ejection fraction of 45-50% on an echocardiogram  done in May 2006, presenting today to the ED with complaints of  progressively worsening shortness of breath and dyspnea on exertion.  Also, noted some occasional chest tightness off and on, which he is  currently not having at this time.  The patient denies any paroxysmal  nocturnal dyspnea and no leg swelling.  He uses one pillow to sleep at  night.  Denies any orthopnea.  The patient was evaluated in the ED and  found to have mild elevation in his BNP at 313.  Chest x-ray done showed  a mild interstitial edema with a borderline cardiomegaly.  As such, the  patient is being admitted for further evaluation and treatment for CHF  decompensation likely secondary to medication noncompliance.  The  patient denied any recent fevers, nausea, vomiting, diarrhea, or  constipation.   PAST MEDICAL HISTORY:  Significant for:  1. History of hypertension.  2. Obstructive sleep apnea, suppose to be using CPAP at night, but not      very compliant.  3. History of obesity.  4. History of nonischemic cardiomyopathy with ejection fraction of 45-      50% on an echocardiogram done in May 2006.  5. History of glucose intolerance.   ALLERGIES:  The patient has no known drug  allergies.   OUTPATIENT MEDICATION:  Lisinopril 40 mg p.o. daily.   FAMILY HISTORY:  The patient is married, has 2 children.  Family history  is significant for diabetes mellitus in the mother, otherwise  unremarkable.   SOCIAL HISTORY:  The patient denies any history of smoking, alcohol, or  IV drug use.   REVIEW OF SYSTEMS:  GENERAL:  No fevers, weight loss, or loss of  appetite.  RESPIRATORY:  Positive for shortness of breath and dyspnea on  exertion.  Does have occasional dry cough.  CVS:  Occasional chest  tightness, but none at this time.  No palpitations.  GI:  No nausea,  vomiting, or diarrhea.  ENDOCRINE:  No polyuria, polydipsia, or  polyphagia.  GENITOURINARY:  No dysuria, frequency, or urgency.  MUSCULOSKELETAL:  No joint pain or muscle aches.  NEURO:  No headaches,  seizures, or syncope.   PHYSICAL EXAMINATION:  VITAL SIGNS:  On presentation, blood pressure is  161/120 down to 154/105, pulse rate of 98, respiratory rate of 18,  temperature 98.3,  O2 sat of 100%.  GENERAL:  On exam, found a middle-aged [place] American male, slightly  obese, not in any acute distress at this time.  HEENT:  Pupils are equal, round, and reactive to light and accommodation  bilaterally.  Extraocular muscles intact.  NECK:  Supple.  No JVD.  No thyroid enlargement.  RESPIRATORY:  Mild bibasilar crackles.  CVS:  Heart sounds 1 and 2.  Regular rate and rhythm.  No murmurs or  gallop.  ABDOMEN:  Slightly obese.  Nontender.  No organomegaly.  Bowel sounds  positive in all quadrants.  EXTREMITIES:  No cyanosis, clubbing, or edema.  Pedal pulses palpable  bilaterally.  NEURO:  The patient is awake, alert, and oriented to time, place, and  person.  No focal findings noted at this time.   LABORATORY DATA:  On presentation, I-STAT was done.  Hemoglobin was 15,  hematocrit 44.  BMP:  Sodium of 141, potassium 3.1, chloride 106, bicarb  22, BUN 13, creatinine 1.2, glucose 101, and ionized calcium  1.09.  BNP  of 313.  Troponin less than 0.05.  CK-MB less than 1.0.  Myoglobin 78.3.  EKG showed normal sinus rhythm, T-wave inversion in leads V5 and V6,  which is old.  Chest x-ray showed mild perihilar interstitial edema or  infiltrates.  Borderline cardiomegaly.   ASSESSMENT:  1. Mild congestive heart failure decompensation.  2. Hypokalemia.  3. History of medication noncompliance and also noncompliance with his      CPAP.  4. History of hypertension, obstructive sleep apnea, and nonischemic      cardiomyopathy with ejection fraction of 45-50% on echo done in May      2006.   PLAN:  1. We will admit to telemetry.  2. Place on IV Lasix 40 mg q.12 h.  3. We will also continue his ACE inhibitor and lisinopril 40 mg p.o.      daily.  We will hold off on statin, beta-blocker until pneumonia      due to the ongoing congestive heart failure decompensation.  4. We would replace the potassium level and then recheck in the      morning.  5. We will get a 2D echocardiogram to re-evaluate the cardiac function      in the morning.  6. Followup cardiac enzymes x2, more sets. Rule out associated      coronary artery disease.  7. We wildailydaily weight check and do strict In and Out.  We will      hold off on getting Cardiology consultation as the patient has just      mild congestive heart failure decompensation and this is likely      secondary to his noncompliance with his medications.  Should the      patient's clinical condition change in the morning, will recommend      getting Cardiology consultation.  8. Education on the importance of medication compliance was      reinforced, and the patient is motivated to be more compliant with      his medication at this time.  9. Prophylaxis.  The patient is being placed on Lovenox.  Case and      plan discussed extensively with the patient, and he voiced      understanding and is in agreement with the plan of care.      Jude Enid Baas, MD   Electronically Signed     JO/MEDQ  D:  10/03/2008  T:  10/03/2008  Job:  [id number] 

## 2010-08-18 NOTE — Consult Note (Signed)
NAME:  Caleb Garcia, INES NO.:  [id number]   MEDICAL RECORD NO.:  [id number]          PATIENT TYPE:  INP   LOCATION:  4728                         FACILITY:  [facility]   PHYSICIAN:  Noel Christmas, MD    DATE OF BIRTH:  06-10-1965   DATE OF CONSULTATION:  10/06/2008  DATE OF DISCHARGE:                                 CONSULTATION   REFERRING SERVICE:  [facility].   REASON FOR CONSULTATION:  Acute stroke.   HISTORY OF PRESENT ILLNESS:  This is a 45 year old African American man  who was admitted on October 03, 2008 with increasing chest pain, thought to  be related to worsening heart failure as well as hypertension.  The  patient has known nonischemic cardiomyopathy and heart failure as well  as severe hypertension and less than ideal compliance with treatment.  The patient developed a headache on October 04, 2008, which was unusual for  him.  An MRI of his brain was obtained, which showed an acute moderate  sized left parieto-occipital nonhemorrhagic infarction.  An MRA was also  done, which showed mild atherosclerotic changes, but was otherwise  unremarkable.  The patient has had mild visual changes over the last  couple of days, which had been somewhat vague and almost  inconsequential.  He has continued to have intermittent headache.  He  has been on aspirin 81 mg per day.   PAST MEDICAL HISTORY:  Remarkable for nonischemic cardiomyopathy with  heart failure, obstructive sleep apnea, obesity, severe hypertension,  and glucose intolerance.   CURRENT MEDICATIONS:  1. Lisinopril 40 mg per day.  2. Coreg 6.25 mg per day.  3. Potassium chloride 40 mEq per day.  4. Aspirin 325 mg per day.  5. Lasix 40 mg per day.  6. Codeine 2 mg.  7. Isosorbide.  8. Zofran 4 mg q.6 h. p.r.n. nausea.  9. Magnesium aluminum hydroxide Simethicone q.4 h. 30 mL to 60 mL      p.r.n.  10.Tylenol 650 mg q.4 h. p.r.n. pain.  11.Senokot 1 per day.  12.Oxycodone 1-2 tablets q.3 h.  p.r.n. pain.   FAMILY HISTORY:  Positive for stroke involving his mother.  Family  history is also strongly positive for hypertension.   PHYSICAL EXAMINATION:  GENERAL:  Appearance was that of young to early-  middle-aged-appearing man who was moderately obese.  He was alert and  cooperative in no acute distress.  Mental status was unremarkable.  HEENT:  His pupils were equal and reactive normally to light.  Extraocular movements were full and conjugate.  NEUROLOGIC:  Visual fields were intact and normal to finger counting.  There was no facial weakness.  Hearing was normal.  Speech and palatal  movement were normal.  Strength and muscle tone were normal throughout.  Deep tendon reflexes were normal and symmetrical.  Plantar responses  were flexor.  Sensory examination was normal.  Carotid auscultation was  normal.   CLINICAL IMPRESSION:  1. Acute left parietooccipital nonhemorrhagic infarction of moderate      size.  2. Significant stroke risk factors including obesity, hypertension,  obstructive sleep apnea, and glucose intolerance.   RECOMMENDATIONS:  1. MRA of the neck with contrast.  2. Consider switching the patient from aspirin to Plavix 75 mg per      day.  3. No rehab intervention is indicated.   Thank you for asking me to evaluate Mr. Debruyne.      Noel Christmas, MD  Electronically Signed     CS/MEDQ  D:  10/06/2008  T:  10/08/2008  Job:  [id number]

## 2010-08-18 NOTE — Discharge Summary (Signed)
NAME:  Caleb Garcia, Caleb Garcia              ACCOUNT NO.:  [id number]   MEDICAL RECORD NO.:  [id number]          PATIENT TYPE:  INP   LOCATION:  4728                         FACILITY:  [facility]   PHYSICIAN:  Charlestine Massed, MDDATE OF BIRTH:  1965-06-05   DATE OF ADMISSION:  10/02/2008  DATE OF DISCHARGE:  10/08/2008                               DISCHARGE SUMMARY   PRIMARY CARE PHYSICIAN:  Della Goo, MD   CARDIOLOGIST:  Bevelyn Buckles. Bensimhon, MD   NEUROLOGIST:  Pramod P. Pearlean Brownie, MD   DISCHARGE DIAGNOSES:  1. Nonischemic cardiomyopathy with congestive heart failure, ejection      fraction of 25%.  2. Acute left parieto-occipital stroke with mild blurriness in the      right eye.  3. Obstructive sleep apnea.  4. Severe hypertension, currently controlled.  5. Obesity.  6. Glucose intolerance.   DISCHARGE MEDICATIONS:  1. Aspirin 81 mg p.o. daily.  2. Coreg 12.5 mg p.o. b.i.d.  3. Lisinopril 40 mg p.o. b.i.d.  4. BiDil 37.5/20 one tablet p.o. t.i.d.  5. Potassium chloride 10 mEq p.o. daily.  6. Lasix 40 mg p.o. daily in a.m.  7. Zocor 40 mg p.o. at bedtime.  8. Plavix 75 mg p.o. daily.   HOSPITAL COURSE:  The following events transpired after October 05, 2008.  The patient was planned for discharge and was delayed when he complained  of some eye blurriness and headache.  A CAT scan of the head revealed  some hypodense lesions in the occipital area, so an MRI was done which  showed an acute stroke in the parieto-occipital lobe which possibly was  causing the blurriness in the eyes.  The patient's blood pressure was  well-controlled.  Next, a neurology consult was called.  Dr. Roseanne Reno saw  the patient.  The patient had evaluation done by Dr. Pearlean Brownie the next day  who suggested to rule out intracardiac thrombus.  The patient was  explained about TEE.  The patient had a TEE today which ruled out  thrombosis in any of the cardiac chambers and no vegetations seen  anywhere.  So, the  patient has been advised to continue on Plavix and  also has been started on Plavix.   MRA of the neck was done which revealed no evidence of any significant  blockages.   DISPOSITION:  Discharged back home.   FOLLOWUP:  1. Follow up with Dr. Gala Romney.  Office will call him for followup.  2. Follow up with Dr. Pearlean Brownie.  Call [phone] to follow up in 1-2 weeks.  3. Follow up with PMD, Dr. Lovell Sheehan, in 1 week.   TIME SPENT:  A total of 40 minutes were spent on this discharge.      Charlestine Massed, MD  Electronically Signed     UT/MEDQ  D:  10/08/2008  T:  10/09/2008  Job:  [id number]   cc:   Della Goo, M.D.  Bevelyn Buckles. Bensimhon, MD  Pramod P. Pearlean Brownie, MD

## 2010-08-21 NOTE — Assessment & Plan Note (Signed)
[facility]                              CARDIOLOGY OFFICE NOTE   NAME:Caleb Garcia, Caleb Garcia                     MRN:          [id number]  DATE:01/11/2006                            DOB:          1965-07-11    PRIMARY CARE PHYSICIAN:  [facility] on [address].   PATIENT IDENTIFICATION:  Caleb Garcia is a 45 year old male who presents for  routine followup.   PROBLEM LIST:  1. Mild nonischemic cardiomyopathy presumed hypertensive in nature.      a.     Stress echocardiogram in May 2006:  EF 45 to 50%, no wall motion       abnormalities.      b.     Followup [facility] in May 2006, EF 47%.  2. Hypertension poorly controlled.  3. Obesity.  4. Sleep apnea, noncompliant with CPAP.  5. Glucose intolerance with fasting glucose 116.  6. Hypokalemia.   MEDICATIONS:  1. Norvasc 10.  2. Lisinopril/HCTZ 20/12.5 daily.  3. Potassium 20 a day.   INTERVAL HISTORY:  Caleb Garcia returns today for routine followup. He says he  is doing quite well after the last time we saw him. He began to get more  exercise and watch his diet more closely. He said he is walking either 30  minutes on the treadmill or three miles on the track five days a week. He is  also doing some light weightlifting. He has also been very careful about  watching his diet. He initially lost 10 pounds, but now his weight is back  up 8 pounds back to 244 from a previous weight of 246. He is quite  frustrated by this. He denies any orthopnea, PND. No significant lower  extremity edema.   PHYSICAL EXAMINATION:  GENERAL:  Well-appearing, in no acute distress.  Ambulates around the clinic without difficulty.  VITAL SIGNS:  Blood pressure 146/95, heart rate 80, weight 244.  HEENT:  Sclerae anicteric. EOMI. There is no xanthelasma. Mucous membranes  are moist.  NECK:  Thick. JVP is mildly evaluated at about 7 cm water. Carotids are 2+  bilaterally without bruits. There is no  adenopathy or thyromegaly.  CARDIAC:  Regular rate and rhythm. No murmurs, rubs or gallops.  LUNGS:  Clear.  ABDOMEN:  Obese, nontender, nondistended. No hepatosplenomegaly, no bruits,  no mass.  EXTREMITIES:  Warm with no cyanosis or clubbing. There is trace to 1+ edema  bilaterally.  NEUROLOGICAL:  Alert and oriented x3. Cranial nerves II-XII are intact. He  moves all four extremities without difficulty.   ASSESSMENT AND PLAN:  1. Congestive heart failure secondary to mild nonischemic cardiomyopathy      presumed hypertensive in nature. He does have some mild fluid on board.      We have increased his lisinopril HCTZ to 40/25 and also add low dose      Coreg at 3.125 b.i.d. Will check CMET and lipids next week. I      congratulate him on his behavioral changes and urge him to be      persistent.  2. Hypertension  as above. Blood pressure remains elevated. We are making      the above changes.   DISPOSITION:  Return to clinic in one month.       Bevelyn Buckles. Bensimhon, MD     DRB/MedQ  DD:  01/11/2006  DT:  01/13/2006  Job #:  [id number]

## 2010-08-21 NOTE — Assessment & Plan Note (Signed)
[facility]                              CARDIOLOGY OFFICE NOTE   NAME:Caleb Garcia, Caleb Garcia                     MRN:          [id number]  DATE:12/02/2005                            DOB:          1966/01/04    PRIMARY CARE PHYSICIAN:  [facility] on [address]   PATIENT IDENTIFICATION:  Caleb Garcia is a 45 year old male with a history of  hypertension and nonischemic cardiomyopathy who presents for routine  followup here.   PROBLEM LIST:  1. Mild nonischemic cardiomyopathy presumed hypertensive in nature.      a.     Stress echocardiogram May 2006 with EF of 45-50% with no stress-       induced wall motion abnormalities.      b.     Follow up [facility] May 2006 with EF of 47%.  2. Hypertension, poorly controlled.  3. Obesity.  4. Sleep apnea, noncompliant, with CPAP.  5. Glucose intolerance with a fasting glucose of 116.   CURRENT MEDICATIONS:  Norvasc 10 mg a day.   INTERVAL HISTORY:  Mr. Cross returns today for routine followup.  He says  he is feeling well.  He has recently gotten back to the gym working out with  weights and he says he is doing light weights but benching over 200 pounds.  He denies any orthopnea, PND. No chest pain, no significant lower extremity  edema.  He has been unable to use his CPAP as the machine is not working for  him.   PHYSICAL EXAMINATION:  GENERAL:  He is well-appearing, in no acute distress,  ambulates without any difficulty.  VITAL SIGNS: Blood pressure 158/100, heart rate 79, weight 246.  HEENT:  Sclerae anicteric.  EOMI.  No xanthelasma.  Mucous membranes are  moist.  NECK:  Thick.  No obvious JVD.  Carotids are 2+ bilaterally and no bruits.  No lymphadenopathy or thyromegaly.  CARDIAC:  Regular rate and rhythm.  No murmurs, rubs or gallops.  LUNGS:  Clear to auscultation.  ABDOMEN:  Obese, nontender, nondistended.  No hepatosplenomegaly.  No  bruits.  No masses.  EXTREMITIES:  Warm  with no cyanosis or  clubbing.  There is trace edema.  Good distal pulses.  NEUROLOGIC:  He is alert and oriented x3.  Cranial nerves II-XII intact. He  moves all four extremities without difficulty.   EKG: Sinus rhythm with a first-degree AV block at a rate of 80, nonspecific  T wave changes throughout.   ASSESSMENT/PLAN:  Problem 1.  Hypertension, poorly controlled.  Given his  proteinuria and glucose intolerance, we will add an ACE inhibitor.  We will  start with lisinopril 20 mg with hydrochlorothiazide 12.5 combination tablet  which should also help with his edema.  We will check a BMET next week to  make sure his electrolytes and renal function are stable.  I had a long talk  with him about the need to control his blood pressure to prevent  complications.   Problem 2.  Mild nonischemic cardiomyopathy.  This is asymptomatic.  I  suspect this is related  to hypertension.  We are starting an ACE inhibitor.  He will likely need a beta blocker in the future.  I have advised him  against heavy weight lifting including isometric exercises.   Problem #3.  Obesity.  I told him that he should focus less on weight  lifting and more on aerobic exercises in an effort to lose weight.   Problem #4.  Glucose intolerance.  Once again increase activity.  Followup  with his primary care doctor.   Problem #5.  Sleep apnea.  Reinforced the need for him to have his CPAP  fixed.   DISPOSITION:  Return to clinic in six weeks.                                Bevelyn Buckles. Bensimhon, MD    DRB/MedQ  DD:  12/02/2005  DT:  12/03/2005  Job #:  [id number]   cc:   [facility]

## 2010-08-21 NOTE — Procedures (Signed)
NAME:  Caleb Garcia, Caleb Garcia NO.:  [id number]   MEDICAL RECORD NO.:  [id number]          PATIENT TYPE:  OUT   LOCATION:  SLEEP CENTER                 FACILITY:  [facility]   PHYSICIAN:  Marcelyn Bruins, M.D. [facility] DATE OF BIRTH:  1965-04-08   DATE OF STUDY:  08/18/2004                              NOCTURNAL POLYSOMNOGRAM   REFERRING PHYSICIAN:  Dr. Marcelyn Bruins.   INDICATION FOR THE STUDY:  Hypersomnia with sleep apnea. Epworth score: 15.   SLEEP ARCHITECTURE:  The patient had a total sleep time of 373 minutes with  very decreased slow wave sleep and also diminished REM. Sleep onset latency  was normal as was REM onset. Sleep efficiency was 93%.   IMPRESSION:  1.  Moderate obstructive sleep apnea/hypopnea syndrome with a respiratory      disturbance index of 29 events per hour and oxygen desaturation as low      as 70%. Events were not positional but were clearly worse during REM.      Treatment of this degree of sleep apnea may include weight loss, upper      airway surgery, oral appliance, C-PAP.  2.  Very loud snoring noted throughout the study.  3.  No clinically significant cardiac arrhythmia.      KC/MEDQ  D:  09/03/2004 11:42:48  T:  09/03/2004 15:00:48  Job:  [id number]

## 2010-09-17 ENCOUNTER — Other Ambulatory Visit: Payer: Self-pay | Admitting: Internal Medicine

## 2010-10-18 ENCOUNTER — Other Ambulatory Visit: Payer: Self-pay | Admitting: Internal Medicine

## 2010-11-18 ENCOUNTER — Other Ambulatory Visit: Payer: Self-pay | Admitting: Internal Medicine

## 2010-11-23 ENCOUNTER — Other Ambulatory Visit: Payer: Self-pay | Admitting: Internal Medicine

## 2010-11-23 NOTE — Telephone Encounter (Signed)
Per pt call pt needs ALL RX's called in for refills. Please call into [facility] pharmacy

## 2010-12-19 ENCOUNTER — Other Ambulatory Visit: Payer: Self-pay | Admitting: Internal Medicine

## 2010-12-25 ENCOUNTER — Other Ambulatory Visit: Payer: Self-pay | Admitting: Internal Medicine

## 2010-12-25 MED ORDER — SPIRONOLACTONE 25 MG PO TABS: 50.0000 mg | ORAL_TABLET | Freq: Every day | ORAL | Status: DC

## 2010-12-25 MED ORDER — LISINOPRIL 40 MG PO TABS: 40.0000 mg | ORAL_TABLET | Freq: Every day | ORAL | Status: DC

## 2010-12-25 MED ORDER — POTASSIUM CHLORIDE CRYS ER 10 MEQ PO TBCR: 10.0000 meq | EXTENDED_RELEASE_TABLET | Freq: Every day | ORAL | Status: DC

## 2010-12-25 MED ORDER — FUROSEMIDE 40 MG PO TABS: 40.0000 mg | ORAL_TABLET | Freq: Every day | ORAL | Status: DC

## 2010-12-25 MED ORDER — HYDRALAZINE HCL 25 MG PO TABS: ORAL_TABLET | ORAL | Status: DC

## 2011-03-03 ENCOUNTER — Ambulatory Visit: Payer: 59 | Admitting: Physician Assistant

## 2011-04-26 ENCOUNTER — Encounter (HOSPITAL_COMMUNITY): Payer: Self-pay

## 2011-04-26 ENCOUNTER — Ambulatory Visit (HOSPITAL_COMMUNITY)
Admission: RE | Admit: 2011-04-26 | Discharge: 2011-04-26 | Disposition: A | Discharge status: Home / Self Care | Payer: 59 | Source: Ambulatory Visit | Attending: Internal Medicine | Admitting: Internal Medicine

## 2011-04-26 VITALS — BP 122/84 | HR 73 | Wt 247.5 lb

## 2011-04-26 DIAGNOSIS — I5022 Chronic systolic (congestive) heart failure: Secondary | ICD-10-CM | POA: Insufficient documentation

## 2011-04-26 NOTE — Patient Instructions (Signed)
Do the following things EVERYDAY: 1) Weigh yourself in the morning before breakfast. Write it down and keep it in a log. 2) Take your medicines as prescribed 3) Eat low salt foods-Limit salt (sodium) to 2000mg  per day.  4) Stay as active as you can everyday   Follow up in 6 months

## 2011-04-26 NOTE — Progress Notes (Signed)
Patient ID: Caleb Garcia, male   DOB: 1965/09/05, 46 y.o.   MRN: [id number] HPI:  Council is a 46 year old, African American male, patient with history of HTN, OSA s/p UPPP and probable familial nonischemic cardiomyopathy (probably hypertensive) and previous occiptial stroke. Cardiac catheterization on October 08, 2008 showed no significant coronary artery disease with moderately severe global LV dysfunction. Ejection fraction 40% with moderate MR.  Echo in 9/11 showed EF 55%.  No signifcant valvular abnormalities. Also had home sleep study. Very mild OSA with AHI 8.  Saw Dr. Shelle Iron and decided to keep off CPAP for now.   Remains active. Last visit Imdur stopped due to initiation of Viagra. Hydralazine continued.  Denies SOB/CP/PND/Orhtopnea. Weight followed by [facility]. Weigh at home 232-235. He is not exercising.  Does not use CPAP. Works full time as Designer, television/film set for recycling truck.    ROS: All systems negative except as listed in HPI, PMH and Problem List.  Past Medical History  Diagnosis Date  . Hypokalemia   . Systolic CHF     mon ischemic  . Glucose intolerance (impaired glucose tolerance)   . Cardiomyopathy   . Obesity   . OSA (obstructive sleep apnea)   . HTN (hypertension)     Current Outpatient Prescriptions  Medication Sig Dispense Refill  . aspirin 81 MG tablet Take 81 mg by mouth daily.        . carvedilol (COREG) 25 MG tablet TAKE ONE TABLET BY MOUTH TWICE DAILY  60 tablet  6  . clopidogrel (PLAVIX) 75 MG tablet Take 75 mg by mouth daily.        . felodipine (PLENDIL) 2.5 MG 24 hr tablet TAKE ONE TABLET BY MOUTH EVERY DAY  30 tablet  6  . furosemide (LASIX) 40 MG tablet Take 1 tablet (40 mg total) by mouth daily.  30 tablet  6  . hydrALAZINE (APRESOLINE) 25 MG tablet Take 1 & 1/2 tabs Three times a day   135 tablet  6  . lisinopril (PRINIVIL,ZESTRIL) 40 MG tablet Take 1 tablet (40 mg total) by mouth daily.  60 tablet  6  . potassium chloride (KLOR-CON M10) 10 MEQ  tablet Take 1 tablet (10 mEq total) by mouth daily.  30 tablet  6  . potassium chloride (KLOR-CON) 10 MEQ CR tablet Take 10 mEq by mouth daily.        . simvastatin (ZOCOR) 40 MG tablet TAKE ONE TABLET BY MOUTH AT BEDTIME  90 tablet  3  . spironolactone (ALDACTONE) 25 MG tablet Take 2 tablets (50 mg total) by mouth daily.  60 tablet  6     PHYSICAL EXAM: Filed Vitals:   04/26/11 1527  BP: 122/84  Pulse: 73  247 pounds (237) General:  Well appearing. No resp difficulty HEENT: normal Neck: supple. JVP flat. Carotids 2+ bilaterally; no bruits. No lymphadenopathy or thryomegaly appreciated. Cor: PMI normal. Regular rate & rhythm. No rubs, gallops or murmurs. Lungs: clear Abdomen: obese soft, nontender, nondistended. No hepatosplenomegaly. No bruits or masses. Good bowel sounds. Extremities: no cyanosis, clubbing, rash, trace edema Neuro: alert & orientedx3, cranial nerves grossly intact. Moves all 4 extremities w/o difficulty. R and L lower extremities equal strength.    ASSESSMENT & PLAN:

## 2011-04-26 NOTE — Assessment & Plan Note (Addendum)
NYHA I. Volume status stable. Weight increased 10 pounds over the last 6 months. Compliant with medications. Follow up in 6 months.   Patient seen and examined with Tonye Becket, NP. We discussed all aspects of the encounter. I agree with the assessment and plan as stated above. His EF has recovered completely. Looks good. Weigh up a bit but likely due to adipose tissue and not volume overload. Continue current regimen.

## 2011-05-08 NOTE — Progress Notes (Signed)
Patient seen and examined with Amy Clegg, NP. We discussed all aspects of the encounter. I agree with the assessment and plan as stated above.   

## 2011-06-10 ENCOUNTER — Ambulatory Visit (INDEPENDENT_AMBULATORY_CARE_PROVIDER_SITE_OTHER): Payer: 59 | Admitting: Family Medicine

## 2011-06-10 VITALS — BP 115/67 | HR 87 | Temp 98.9°F | Resp 16 | Ht 68.5 in | Wt 243.4 lb

## 2011-06-10 DIAGNOSIS — R05 Cough: Secondary | ICD-10-CM

## 2011-06-10 DIAGNOSIS — D649 Anemia, unspecified: Secondary | ICD-10-CM

## 2011-06-10 DIAGNOSIS — R059 Cough, unspecified: Secondary | ICD-10-CM

## 2011-06-10 DIAGNOSIS — R509 Fever, unspecified: Secondary | ICD-10-CM

## 2011-06-10 DIAGNOSIS — R6889 Other general symptoms and signs: Secondary | ICD-10-CM

## 2011-06-10 LAB — POCT CBC
Granulocyte percent: 54.1 % (ref 37–80)
HCT, POC: 35.5 % — AB (ref 43.5–53.7)
Hemoglobin: 11 g/dL — AB (ref 14.1–18.1)
Lymph, poc: 2.1 (ref 0.6–3.4)
MCH, POC: 28.1 pg (ref 27–31.2)
MCHC: 31 g/dL — AB (ref 31.8–35.4)
MCV: 90.5 fL (ref 80–97)
MID (cbc): 0.3 (ref 0–0.9)
MPV: 11.7 fL (ref 0–99.8)
POC Granulocyte: 2.9 (ref 2–6.9)
POC LYMPH PERCENT: 40 %L (ref 10–50)
POC MID %: 5.9 % (ref 0–12)
Platelet Count, POC: 175 10*3/uL (ref 142–424)
RBC: 3.92 M/uL — AB (ref 4.69–6.13)
RDW, POC: 14.6 %
WBC: 5.3 10*3/uL (ref 4.6–10.2)

## 2011-06-10 LAB — POCT INFLUENZA A/B
Influenza A, POC: NEGATIVE
Influenza B, POC: NEGATIVE

## 2011-06-10 MED ORDER — FLUTICASONE PROPIONATE 50 MCG/ACT NA SUSP: 2.0000 | Freq: Every day | NASAL | Status: DC

## 2011-06-10 MED ORDER — BENZONATATE 100 MG PO CAPS: 100.0000 mg | ORAL_CAPSULE | Freq: Two times a day (BID) | ORAL | Status: AC | PRN

## 2011-06-10 MED ORDER — HYDROCODONE-HOMATROPINE 5-1.5 MG/5ML PO SYRP: 5.0000 mL | ORAL_SOLUTION | Freq: Three times a day (TID) | ORAL | Status: AC | PRN

## 2011-06-10 NOTE — Progress Notes (Signed)
[facility]:  Office Visit  Chief Complaint:  Chief Complaint  Patient presents with  . Nausea    x 2 dys  . Diarrhea    x 2 dys  . Fever    x 2 dys  . Tongue Sore    x 2 dys    HPI: Caleb Garcia is a 46 y.o. male who complains of 2 day history of body aches, nonbloody diarrhea x 5-6 x in 24 hr period, subjective fever and chills. + cough green production. Tried Tylenol without relief. No CP, SOB, wheeze, sinus pain or ear problems, denies asthma or allergies. Wife is sick. + flu vaccine   Past Medical History  Diagnosis Date  . Hypokalemia   . Systolic CHF     mon ischemic  . Glucose intolerance (impaired glucose tolerance)   . Cardiomyopathy   . Obesity   . OSA (obstructive sleep apnea)   . HTN (hypertension)   . OSA (obstructive sleep apnea)    Past Surgical History  Procedure Date  . Cardiac catheterization 10/08/08  . Tonsillectomy    History   Social History  . Marital Status: Married    Spouse Name: N/A    Number of Children: N/A  . Years of Education: N/A   Occupational History  .  [employer]   Social History Main Topics  . Smoking status: Never Smoker   . Smokeless tobacco: None  . Alcohol Use: No  . Drug Use: No  . Sexually Active: None   Other Topics Concern  . None   Social History Narrative  . None   Family History  Problem Relation Age of Onset  . Cancer    . Diabetes    . Hypertension    . Heart disease Mother   . Hypertension Mother   . Diabetes Mother   . Cancer Father   . Stroke Father    No Known Allergies Prior to Admission medications   Medication Sig Start Date End Date Taking? Authorizing Provider  aspirin 81 MG tablet Take 81 mg by mouth daily.     Yes Historical Provider, MD  carvedilol (COREG) 25 MG tablet TAKE ONE TABLET BY MOUTH TWICE DAILY 11/18/10  Yes Bevelyn Buckles Bensimhon, MD  felodipine (PLENDIL) 2.5 MG 24 hr tablet TAKE ONE TABLET BY MOUTH EVERY DAY 12/19/10  Yes Lewayne Bunting, MD   furosemide (LASIX) 40 MG tablet Take 1 tablet (40 mg total) by mouth daily. 12/25/10  Yes Dolores Patty, MD  hydrALAZINE (APRESOLINE) 25 MG tablet Take 25 mg by mouth 3 (three) times daily. 12/25/10  Yes Bevelyn Buckles Bensimhon, MD  lisinopril (PRINIVIL,ZESTRIL) 40 MG tablet Take 1 tablet (40 mg total) by mouth daily. 12/25/10  Yes Bevelyn Buckles Bensimhon, MD  potassium chloride (KLOR-CON M10) 10 MEQ tablet Take 1 tablet (10 mEq total) by mouth daily. 12/25/10  Yes Dolores Patty, MD  simvastatin (ZOCOR) 40 MG tablet TAKE ONE TABLET BY MOUTH AT BEDTIME 09/17/10  Yes Dolores Patty, MD  spironolactone (ALDACTONE) 25 MG tablet Take 2 tablets (50 mg total) by mouth daily. 12/25/10  Yes Bevelyn Buckles Bensimhon, MD     ROS: The patient denies night sweats, unintentional weight loss, chest pain, palpitations, wheezing, dyspnea on exertion, vomiting, abdominal pain, dysuria, hematuria, melena, numbness, weakness, or tingling. + N, Fevers, Chills, diarrhea.   All other systems have been reviewed and were otherwise negative with the exception of those mentioned in the  HPI and as above.    PHYSICAL EXAM: Filed Vitals:   06/10/11 1721  BP: 115/67  Pulse: 87  Temp: 98.9 F (37.2 C)  Resp: 16   Filed Vitals:   06/10/11 1721  Height: 5' 8.5" (1.74 m)  Weight: 243 lb 6.4 oz (110.406 kg)   Body mass index is 36.47 kg/(m^2).  General: Alert, no acute distress HEENT:  Normocephalic, atraumatic, oropharynx patent. TM normal, no exudate, + erythema of nares. Cardiovascular:  Regular rate and rhythm, no rubs murmurs or gallops.  No Carotid bruits, radial pulse intact. No pedal edema.  Respiratory: Clear to auscultation bilaterally.  No wheezes, rales, or rhonchi.  No cyanosis, no use of accessory musculature GI: No organomegaly, abdomen is soft and non-tender, positive bowel sounds.  No masses. Skin: No rashes. Neurologic: Facial musculature symmetric. Psychiatric: Patient is appropriate throughout our  interaction. Lymphatic: No cervical lymphadenopathy Musculoskeletal: Gait intact.   LABS: Flu negative   ASSESSMENT/PLAN: Encounter Diagnoses  Name Primary?  . Fever Yes  . Flu-like symptoms   . Cough   . Anemia    1. Most likely viral. Flu nl. CBC nl except for anemia. Sx treatment only. Rx Flonase, Tessalon Perles, and Hydromet 3. Anemia-need f/u for anemia in 1 month. I believe he had a negative hemosure and also normal iron studies within the last year. However his HGb has been trending down based on our chart records.  2. Note for work was given    Rockne Coons, DO 06/10/2011 6:46 PM

## 2011-07-19 ENCOUNTER — Other Ambulatory Visit: Payer: Self-pay | Admitting: Internal Medicine

## 2011-07-21 ENCOUNTER — Telehealth (HOSPITAL_COMMUNITY): Payer: Self-pay | Admitting: *Deleted

## 2011-07-21 MED ORDER — LISINOPRIL 40 MG PO TABS: 40.0000 mg | ORAL_TABLET | Freq: Every day | ORAL | Status: DC

## 2011-07-21 NOTE — Telephone Encounter (Signed)
Mr Caras called today.  He needs a refill for his B/P medicine called into [place] [facility] pharmacy.  Thanks.

## 2011-07-21 NOTE — Telephone Encounter (Signed)
Reordered Lisinopril as requested.

## 2011-08-23 ENCOUNTER — Other Ambulatory Visit: Payer: Self-pay | Admitting: Cardiology

## 2011-08-23 ENCOUNTER — Other Ambulatory Visit: Payer: Self-pay | Admitting: Internal Medicine

## 2011-08-25 ENCOUNTER — Other Ambulatory Visit: Payer: Self-pay | Admitting: Internal Medicine

## 2011-08-25 MED ORDER — SPIRONOLACTONE 25 MG PO TABS: 50.0000 mg | ORAL_TABLET | Freq: Every day | ORAL | Status: DC

## 2011-08-25 MED ORDER — CARVEDILOL 25 MG PO TABS: 25.0000 mg | ORAL_TABLET | Freq: Two times a day (BID) | ORAL | Status: DC

## 2011-08-25 MED ORDER — POTASSIUM CHLORIDE CRYS ER 10 MEQ PO TBCR: 10.0000 meq | EXTENDED_RELEASE_TABLET | Freq: Every day | ORAL | Status: DC

## 2011-08-25 MED ORDER — HYDRALAZINE HCL 25 MG PO TABS: 37.5000 mg | ORAL_TABLET | Freq: Three times a day (TID) | ORAL | Status: DC

## 2011-12-20 ENCOUNTER — Other Ambulatory Visit: Payer: Self-pay | Admitting: Internal Medicine

## 2012-02-22 IMAGING — CR DG CHEST 2V
2 series · 2 of 2 positions shown · non-contrast
Comparison: 09/15/2008

CLINICAL DATA: Left chest pain

CHEST - 2 VIEW

[w chest pa]
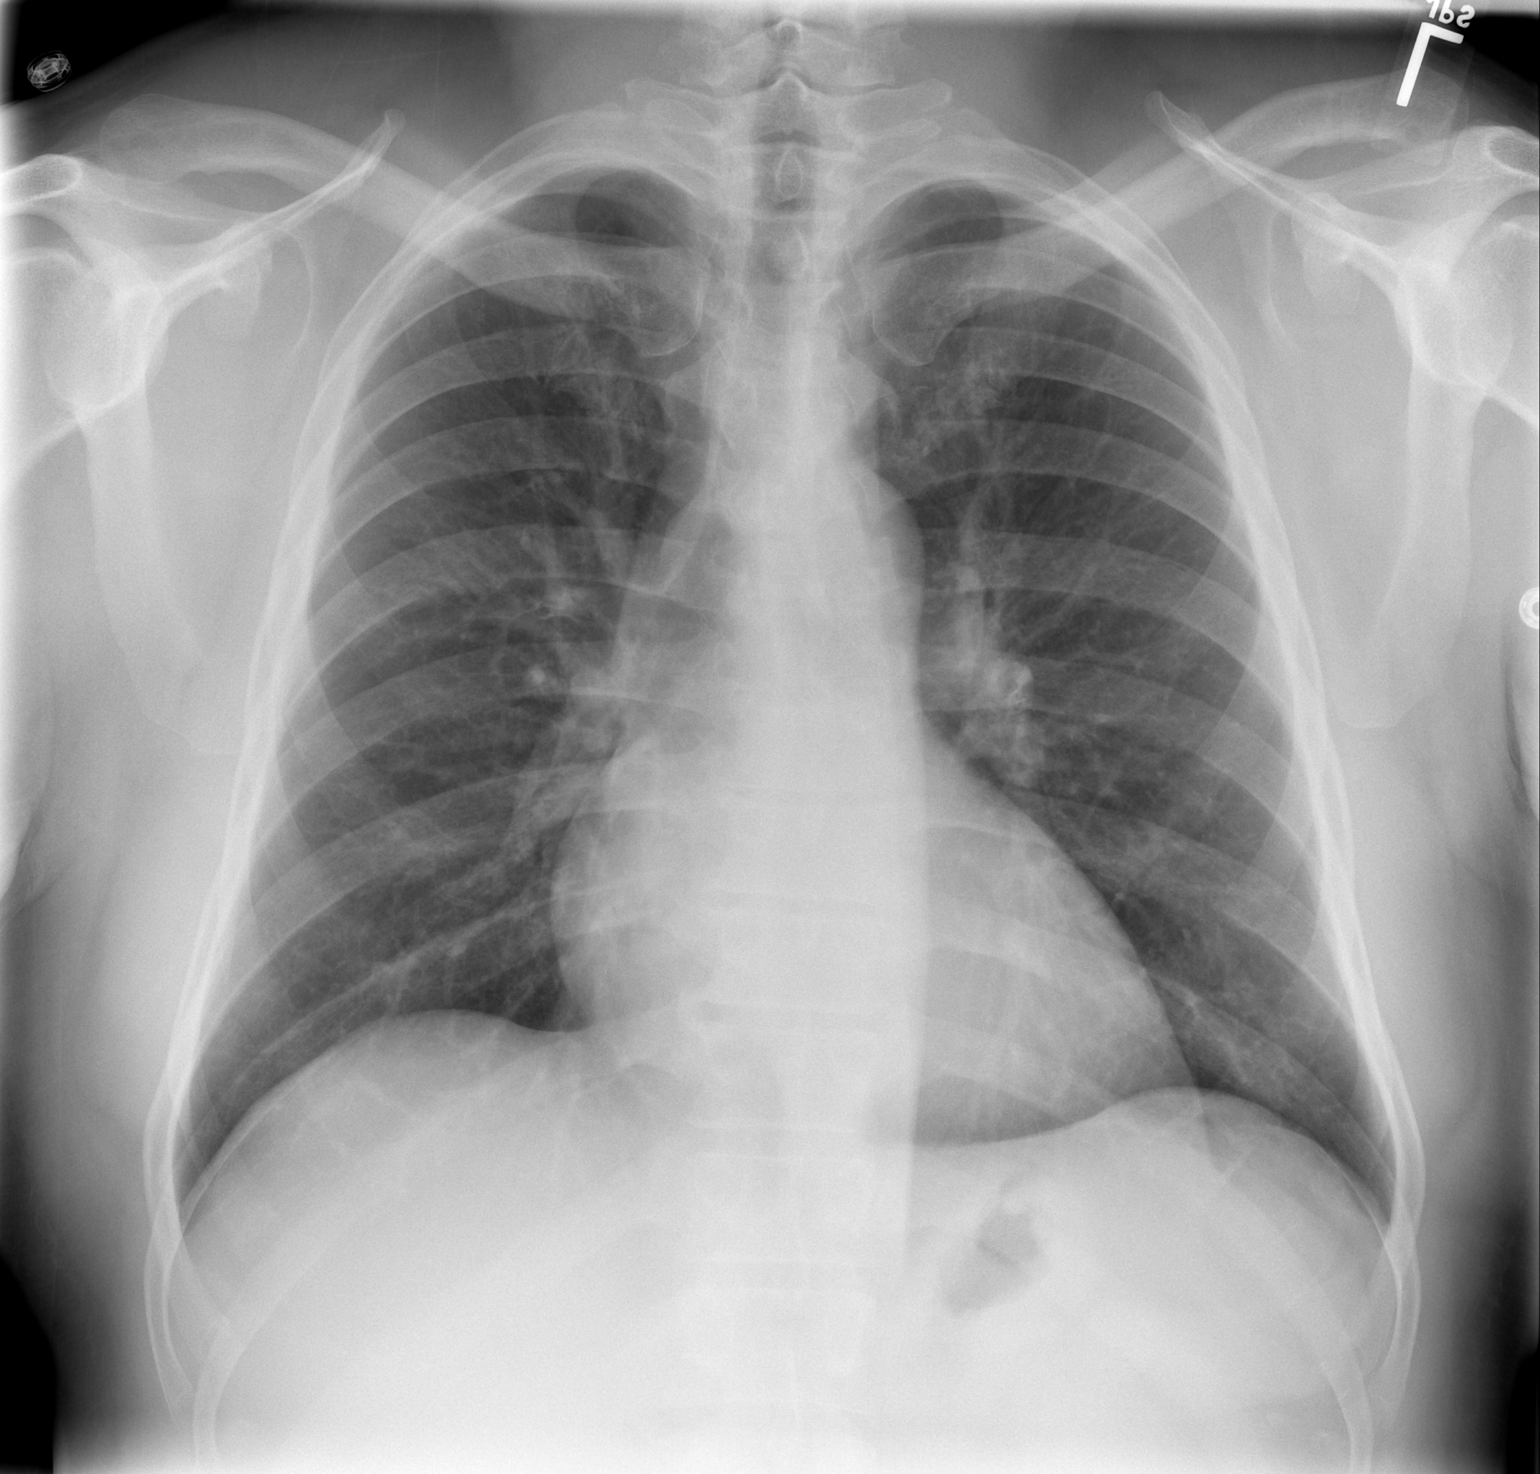

[w chest lat]
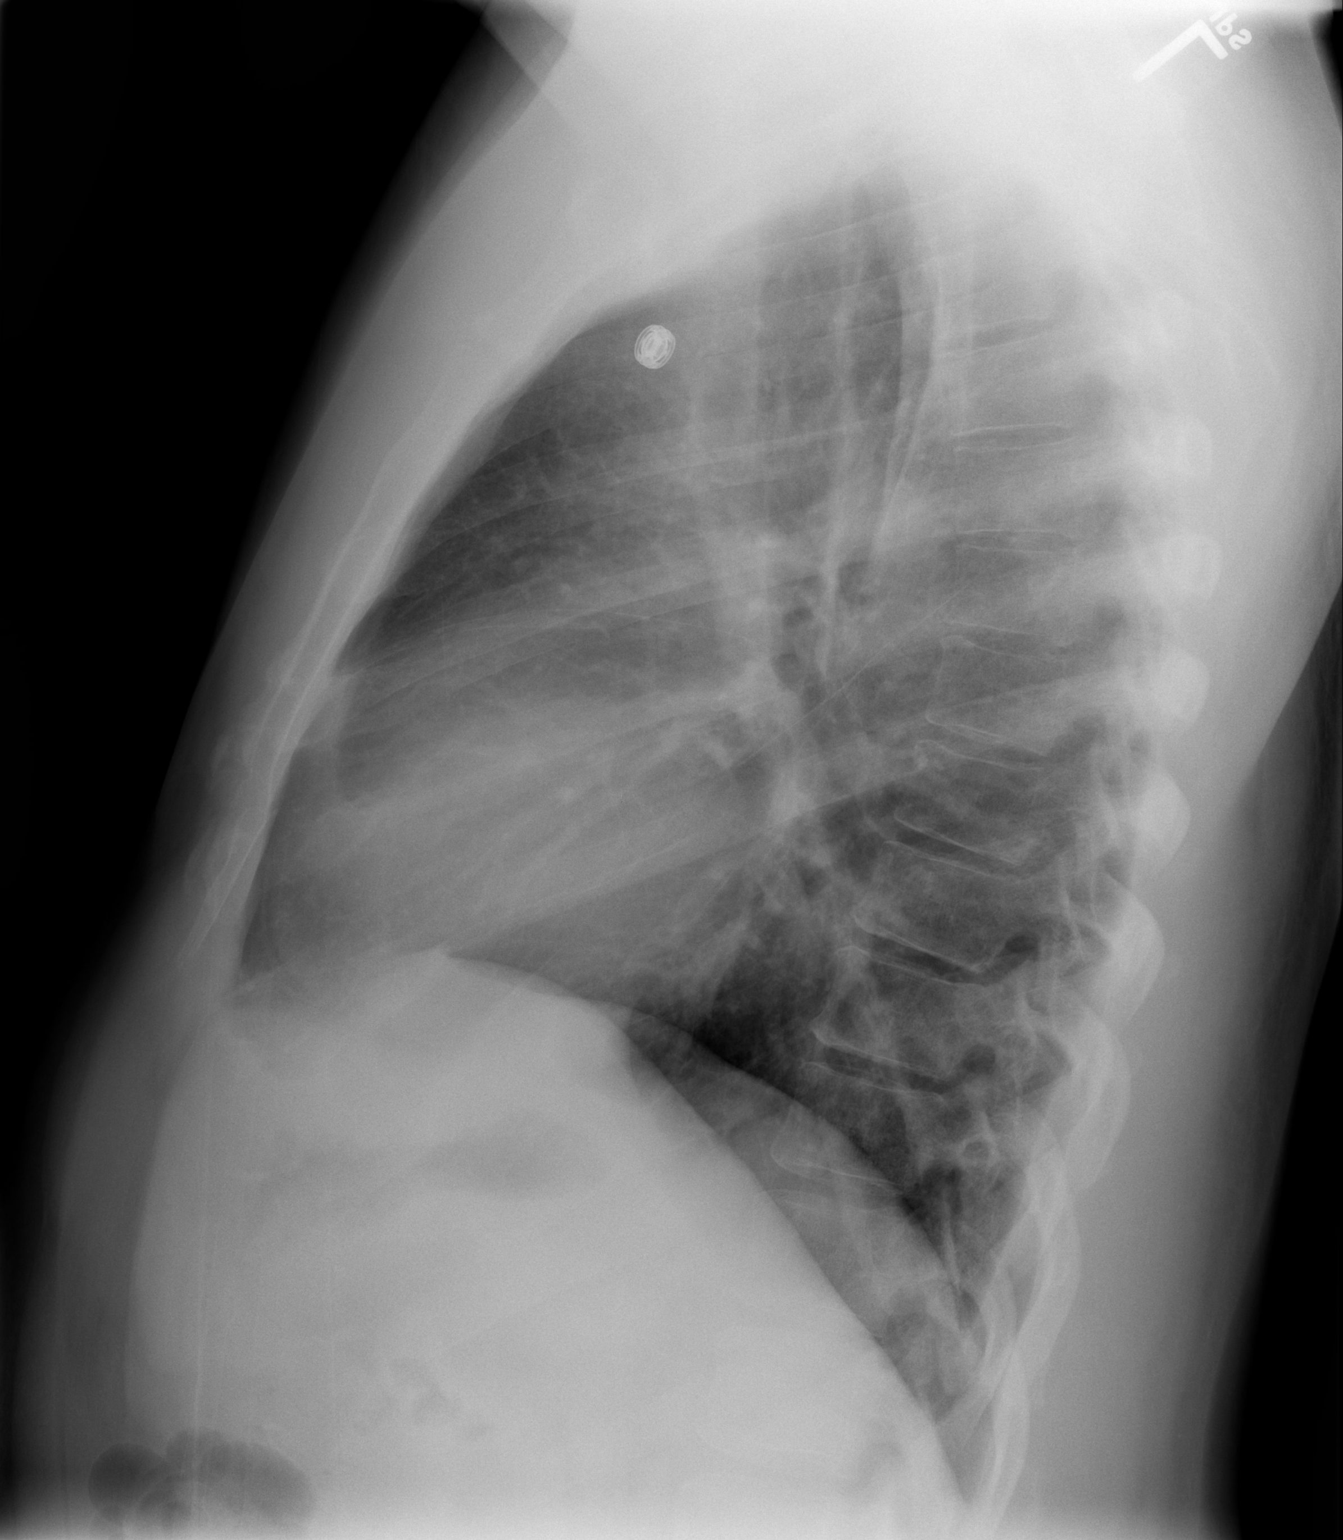

[2 of 2 positions shown; findings below may reference images not displayed]

FINDINGS: Heart size upper normal.  Currently no congestive heart
failure or active disease.  No pleural fluid or osseous lesions.
IMPRESSION: No acute or significant findings.

## 2012-05-17 ENCOUNTER — Other Ambulatory Visit: Payer: Self-pay | Admitting: Internal Medicine

## 2012-06-22 ENCOUNTER — Other Ambulatory Visit: Payer: Self-pay | Admitting: Internal Medicine

## 2012-06-23 ENCOUNTER — Telehealth: Payer: Self-pay | Admitting: Internal Medicine

## 2012-06-23 NOTE — Telephone Encounter (Signed)
Will forward to [facility] in CHF clinic to address.  Pt may require an appt for refills.

## 2012-06-23 NOTE — Telephone Encounter (Signed)
Patient would like for you to call all of his medications in except for his cholesterol med. To [facility] on [address]  Please call him if you have any questions.

## 2012-06-26 ENCOUNTER — Other Ambulatory Visit: Payer: Self-pay | Admitting: Internal Medicine

## 2012-06-26 NOTE — Telephone Encounter (Signed)
Pt calling to say he needs refill of all meds except cholesterol med, [facility] [place]

## 2012-06-27 ENCOUNTER — Telehealth: Payer: Self-pay

## 2012-06-27 MED ORDER — HYDRALAZINE HCL 25 MG PO TABS: 37.5000 mg | ORAL_TABLET | Freq: Three times a day (TID) | ORAL | Status: DC

## 2012-06-27 MED ORDER — CARVEDILOL 25 MG PO TABS: 25.0000 mg | ORAL_TABLET | Freq: Two times a day (BID) | ORAL | Status: DC

## 2012-06-27 MED ORDER — LISINOPRIL 40 MG PO TABS: 40.0000 mg | ORAL_TABLET | Freq: Every day | ORAL | Status: DC

## 2012-06-27 MED ORDER — FELODIPINE ER 2.5 MG PO TB24: 2.5000 mg | ORAL_TABLET | Freq: Every day | ORAL | Status: DC

## 2012-06-27 NOTE — Telephone Encounter (Signed)
Spoke w/pt advised all meds were sent to [facility] today he is on his way there now and will c/b if they do not have them all

## 2012-06-27 NOTE — Telephone Encounter (Signed)
Pt aware prescriptions sent in, follow up appt scheduled

## 2012-06-27 NOTE — Telephone Encounter (Signed)
Mr Mallon called saying all his medications were not refilled. He would like a call back from Dr. Prescott Gum nurse. Told patient I will route message to her.

## 2012-07-24 ENCOUNTER — Ambulatory Visit (HOSPITAL_COMMUNITY)
Admission: RE | Admit: 2012-07-24 | Discharge: 2012-07-24 | Disposition: A | Discharge status: Home / Self Care | Payer: 59 | Source: Ambulatory Visit | Attending: Internal Medicine | Admitting: Internal Medicine

## 2012-07-24 ENCOUNTER — Encounter (HOSPITAL_COMMUNITY): Payer: Self-pay | Admitting: Physician Assistant

## 2012-07-24 VITALS — BP 118/76 | HR 93 | Wt 251.2 lb

## 2012-07-24 DIAGNOSIS — I5020 Unspecified systolic (congestive) heart failure: Secondary | ICD-10-CM | POA: Insufficient documentation

## 2012-07-24 DIAGNOSIS — Z79899 Other long term (current) drug therapy: Secondary | ICD-10-CM | POA: Insufficient documentation

## 2012-07-24 DIAGNOSIS — Z7982 Long term (current) use of aspirin: Secondary | ICD-10-CM | POA: Insufficient documentation

## 2012-07-24 DIAGNOSIS — I428 Other cardiomyopathies: Secondary | ICD-10-CM | POA: Insufficient documentation

## 2012-07-24 DIAGNOSIS — Z8673 Personal history of transient ischemic attack (TIA), and cerebral infarction without residual deficits: Secondary | ICD-10-CM | POA: Insufficient documentation

## 2012-07-24 DIAGNOSIS — E669 Obesity, unspecified: Secondary | ICD-10-CM | POA: Insufficient documentation

## 2012-07-24 DIAGNOSIS — I10 Essential (primary) hypertension: Secondary | ICD-10-CM | POA: Insufficient documentation

## 2012-07-24 DIAGNOSIS — G4733 Obstructive sleep apnea (adult) (pediatric): Secondary | ICD-10-CM | POA: Insufficient documentation

## 2012-07-24 DIAGNOSIS — E739 Lactose intolerance, unspecified: Secondary | ICD-10-CM | POA: Insufficient documentation

## 2012-07-24 LAB — BASIC METABOLIC PANEL
Calcium: 9.9 mg/dL (ref 8.4–10.5)
GFR calc non Af Amer: >90 mL/min (ref >90)
Potassium: 3.9 mEq/L (ref 3.5–5.1)
Sodium: 135 mEq/L (ref 135–145)

## 2012-07-24 NOTE — Assessment & Plan Note (Signed)
Well-controlled on current regimen, no changes. 

## 2012-07-24 NOTE — Assessment & Plan Note (Signed)
EF has recovered per last echo. Will continue current regimen.  Check BMET today to follow up on electrolytes and renal function.

## 2012-07-24 NOTE — Progress Notes (Signed)
  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 Cardiac catheterization on October 08, 2008 showed no significant coronary artery disease with moderately severe global LV dysfunction. Ejection fraction 40% with moderate MR.  Echo in 9/11 showed EF 55%.  No signifcant valvular abnormalities. Also had home sleep study. Very mild OSA with AHI 8.  Saw Dr. Shelle Iron and decided to keep off CPAP for now.   He returns for follow up today.  He feels well.  He is overeating a lot.  Denies dyspnea, orthopnea, or PRN.  No edema.  No chest pain.  He is not exercising.  Works full time time as Radio broadcast assistant.     ROS: All systems negative except as listed in HPI, PMH and Problem List.  Past Medical History  Diagnosis Date  . Hypokalemia   . Systolic CHF     mon ischemic  . Glucose intolerance (impaired glucose tolerance)   . Cardiomyopathy   . Obesity   . OSA (obstructive sleep apnea)   . HTN (hypertension)   . OSA (obstructive sleep apnea)      Current Outpatient Prescriptions  Medication Sig Dispense Refill  . aspirin 81 MG tablet Take 81 mg by mouth daily.        . carvedilol (COREG) 25 MG tablet Take 1 tablet (25 mg total) by mouth 2 (two) times daily with a meal.  60 tablet  2  . felodipine (PLENDIL) 2.5 MG 24 hr tablet Take 1 tablet (2.5 mg total) by mouth daily.  30 tablet  2  . furosemide (LASIX) 40 MG tablet TAKE ONE TABLET BY MOUTH ONCE DAILY  30 tablet  2  . hydrALAZINE (APRESOLINE) 25 MG tablet Take 25 mg by mouth 3 (three) times daily.      Marland Kitchen KLOR-CON M10 10 MEQ tablet TAKE ONE TABLET BY MOUTH EVERY DAY  30 tablet  2  . lisinopril (PRINIVIL,ZESTRIL) 40 MG tablet Take 1 tablet (40 mg total) by mouth daily.  30 tablet  2  . simvastatin (ZOCOR) 40 MG tablet TAKE ONE TABLET BY MOUTH AT BEDTIME  90 tablet  2  . spironolactone  (ALDACTONE) 25 MG tablet TAKE TWO TABLETS BY MOUTH EVERY DAY  60 tablet  2   No current facility-administered medications for this encounter.      PHYSICAL EXAM: Filed Vitals:   07/24/12 0914  BP: 118/76  Pulse: 93  Weight: 251 lb 4 oz (113.966 kg)  SpO2: 99%    General:  Well appearing. No resp difficulty HEENT: normal Neck: supple. JVP flat. Carotids 2+ bilaterally; no bruits. No lymphadenopathy or thryomegaly appreciated. Cor: PMI normal. Regular rate & rhythm. No rubs, gallops or murmurs. Lungs: clear Abdomen: obese soft, nontender, nondistended. No hepatosplenomegaly. No bruits or masses. Good bowel sounds. Extremities: no cyanosis, clubbing, rash, edema Neuro: alert & orientedx3, cranial nerves grossly intact. Moves all 4 extremities w/o difficulty. R and L lower extremities equal strength.    ASSESSMENT & PLAN:

## 2012-07-24 NOTE — Assessment & Plan Note (Signed)
Have discussed better snacking options and starting to walk/exercise at least 20 min a day.  He voices understanding.

## 2012-09-29 ENCOUNTER — Other Ambulatory Visit (HOSPITAL_COMMUNITY): Payer: Self-pay | Admitting: *Deleted

## 2012-09-29 ENCOUNTER — Ambulatory Visit: Payer: 59

## 2012-09-29 ENCOUNTER — Ambulatory Visit (INDEPENDENT_AMBULATORY_CARE_PROVIDER_SITE_OTHER): Payer: 59 | Admitting: Family Medicine

## 2012-09-29 VITALS — BP 124/80 | HR 78 | Temp 98.0°F | Resp 18 | Ht 70.0 in | Wt 259.0 lb

## 2012-09-29 DIAGNOSIS — R509 Fever, unspecified: Secondary | ICD-10-CM

## 2012-09-29 DIAGNOSIS — J069 Acute upper respiratory infection, unspecified: Secondary | ICD-10-CM

## 2012-09-29 DIAGNOSIS — J029 Acute pharyngitis, unspecified: Secondary | ICD-10-CM

## 2012-09-29 LAB — POCT CBC
Granulocyte percent: 67.3 %G (ref 37–80)
HCT, POC: 39.7 % — AB (ref 43.5–53.7)
Hemoglobin: 12.4 g/dL — AB (ref 14.1–18.1)
Lymph, poc: 2 (ref 0.6–3.4)
MCH, POC: 29.7 pg (ref 27–31.2)
MCHC: 31.2 g/dL — AB (ref 31.8–35.4)
MCV: 95.2 fL (ref 80–97)
MID (cbc): 0.7 (ref 0–0.9)
MPV: 10.9 fL (ref 0–99.8)
POC Granulocyte: 5.5 (ref 2–6.9)
POC LYMPH PERCENT: 24.2 %L (ref 10–50)
POC MID %: 8.5 % (ref 0–12)
Platelet Count, POC: 218 10*3/uL (ref 142–424)
RBC: 4.17 M/uL — AB (ref 4.69–6.13)
RDW, POC: 13.9 %
WBC: 8.1 10*3/uL (ref 4.6–10.2)

## 2012-09-29 LAB — POCT RAPID STREP A (OFFICE): Rapid Strep A Screen: NEGATIVE

## 2012-09-29 MED ORDER — FUROSEMIDE 40 MG PO TABS: ORAL_TABLET | ORAL | Status: DC

## 2012-09-29 MED ORDER — SPIRONOLACTONE 25 MG PO TABS: 50.0000 mg | ORAL_TABLET | Freq: Every day | ORAL | Status: DC

## 2012-09-29 MED ORDER — AMOXICILLIN-POT CLAVULANATE 875-125 MG PO TABS: 1.0000 | ORAL_TABLET | Freq: Two times a day (BID) | ORAL | Status: DC

## 2012-09-29 MED ORDER — POTASSIUM CHLORIDE CRYS ER 10 MEQ PO TBCR: EXTENDED_RELEASE_TABLET | ORAL | Status: DC

## 2012-09-29 NOTE — Progress Notes (Signed)
[facility]:  Office Visit  Chief Complaint:  Chief Complaint  Patient presents with  . congestion, fever, dizziness    x2 days   . Shoulder Pain    HPI: Caleb Garcia is a 47 y.o. male who complains of: Nasal congestion x 2 days, subjective fevers, and sore throat, dizziness, productive yellow sputum with streak of blood in it. He has tried tylenol and drinking gatorade and orange juice without relief.  Has neck pain. Facial congestion. He recently came back from [place] returned on June 23 and started feeling sick on June 25-26. He has a h/o CHF due to poorly controlled HTN and is now on meds. HE deneis weight gain, SOB, Edwardine Deschepper edema/swelling. Feels tired.   Past Medical History  Diagnosis Date  . Hypokalemia   . Systolic CHF     mon ischemic  . Glucose intolerance (impaired glucose tolerance)   . Cardiomyopathy   . Obesity   . OSA (obstructive sleep apnea)   . HTN (hypertension)   . OSA (obstructive sleep apnea)   . CHF (congestive heart failure)    Past Surgical History  Procedure Laterality Date  . Cardiac catheterization  10/08/08  . Tonsillectomy     History   Social History  . Marital Status: Married    Spouse Name: N/A    Number of Children: N/A  . Years of Education: N/A   Occupational History  .  [employer]   Social History Main Topics  . Smoking status: Never Smoker   . Smokeless tobacco: None  . Alcohol Use: No  . Drug Use: No  . Sexually Active: None   Other Topics Concern  . None   Social History Narrative  . None   Family History  Problem Relation Age of Onset  . Cancer    . Diabetes    . Hypertension    . Heart disease Mother   . Hypertension Mother   . Diabetes Mother   . Cancer Father   . Stroke Father    No Known Allergies Prior to Admission medications   Medication Sig Start Date End Date Taking? Authorizing Provider  aspirin 81 MG tablet Take 81 mg by mouth daily.     Yes Historical Provider, MD   carvedilol (COREG) 25 MG tablet Take 1 tablet (25 mg total) by mouth 2 (two) times daily with a meal. 06/27/12  Yes Dolores Patty, MD  felodipine (PLENDIL) 2.5 MG 24 hr tablet Take 1 tablet (2.5 mg total) by mouth daily. 06/27/12  Yes Dolores Patty, MD  furosemide (LASIX) 40 MG tablet TAKE ONE TABLET BY MOUTH ONCE DAILY 09/29/12  Yes Dolores Patty, MD  hydrALAZINE (APRESOLINE) 25 MG tablet Take 25 mg by mouth 3 (three) times daily. 06/27/12  Yes Bevelyn Buckles Bensimhon, MD  lisinopril (PRINIVIL,ZESTRIL) 40 MG tablet Take 1 tablet (40 mg total) by mouth daily. 06/27/12  Yes Dolores Patty, MD  potassium chloride (KLOR-CON M10) 10 MEQ tablet TAKE ONE TABLET BY MOUTH EVERY DAY 09/29/12  Yes Dolores Patty, MD  simvastatin (ZOCOR) 40 MG tablet TAKE ONE TABLET BY MOUTH AT BEDTIME 12/20/11  Yes Dolores Patty, MD  spironolactone (ALDACTONE) 25 MG tablet Take 2 tablets (50 mg total) by mouth daily. 09/29/12  Yes Dolores Patty, MD     ROS: The patient denies  night sweats, unintentional weight loss, chest pain, palpitations, wheezing, dyspnea on exertion, nausea, vomiting, abdominal pain, dysuria, hematuria, melena,  numbness,  or tingling.   All other systems have been reviewed and were otherwise negative with the exception of those mentioned in the HPI and as above.    PHYSICAL EXAM: Filed Vitals:   09/29/12 1013  BP: 124/80  Pulse: 78  Temp: 98 F (36.7 C)  Resp: 18  Spo2  99% Filed Vitals:   09/29/12 1013  Height: 5\' 10"  (1.778 m)  Weight: 259 lb (117.482 kg)   Body mass index is 37.16 kg/(m^2).  General: Alert, no acute distress HEENT:  Normocephalic, atraumatic, oropharynx patent.  Cardiovascular:  Regular rate and rhythm, no rubs murmurs or gallops.  No Carotid bruits, radial pulse intact. No pedal edema.  Respiratory: Clear to auscultation bilaterally.  No wheezes, rales, or rhonchi.  No cyanosis, no use of accessory musculature GI: No organomegaly, abdomen is  soft and non-tender, positive bowel sounds.  No masses. Skin: No rashes. Neurologic: Facial musculature symmetric. Psychiatric: Patient is appropriate throughout our interaction. Lymphatic: No cervical lymphadenopathy Musculoskeletal: Gait intact.   LABS: Results for orders placed in visit on 09/29/12  POCT CBC      Result Value Range   WBC 8.1  4.6 - 10.2 K/uL   Lymph, poc 2.0  0.6 - 3.4   POC LYMPH PERCENT 24.2  10 - 50 %L   MID (cbc) 0.7  0 - 0.9   POC MID % 8.5  0 - 12 %M   POC Granulocyte 5.5  2 - 6.9   Granulocyte percent 67.3  37 - 80 %G   RBC 4.17 (*) 4.69 - 6.13 M/uL   Hemoglobin 12.4 (*) 14.1 - 18.1 g/dL   HCT, POC 78.4 (*) 69.6 - 53.7 %   MCV 95.2  80 - 97 fL   MCH, POC 29.7  27 - 31.2 pg   MCHC 31.2 (*) 31.8 - 35.4 g/dL   RDW, POC 29.5     Platelet Count, POC 218  142 - 424 K/uL   MPV 10.9  0 - 99.8 fL  POCT RAPID STREP A (OFFICE)      Result Value Range   Rapid Strep A Screen Negative  Negative     EKG/XRAY:   Primary read interpreted by [facility] Conley [facility]s at [facility]. ? Right lower lobe PNA vs increase vascular markings.  No effusion or pneumothorax.    ASSESSMENT/PLAN: Encounter Diagnoses  Name Primary?  . Fever Yes  . URI, acute   . Pharyngitis    Acute sinusitis, bronchitis vs less likely PNA Rx Augmentin OTC allergy meds without decongestant Sxs treatment, does not want any rx cough medication  or flonase Anemia has beens table for the last 3 years---HgB around 12 Gross sideeffects, risk and benefits, and alternatives of medications d/w patient. Patient is aware that all medications have potential sideeffects and we are unable to predict every sideeffect or drug-drug interaction that may occur. F/u prn   Usiel Astarita PHUONG, DO 09/29/2012 11:58 AM

## 2012-11-03 ENCOUNTER — Other Ambulatory Visit: Payer: Self-pay | Admitting: Cardiology

## 2012-11-22 ENCOUNTER — Other Ambulatory Visit: Payer: Self-pay | Admitting: *Deleted

## 2012-11-22 MED ORDER — CARVEDILOL 25 MG PO TABS: 25.0000 mg | ORAL_TABLET | Freq: Two times a day (BID) | ORAL | Status: DC

## 2012-12-12 ENCOUNTER — Telehealth: Payer: Self-pay

## 2012-12-12 MED ORDER — LISINOPRIL 40 MG PO TABS: 40.0000 mg | ORAL_TABLET | Freq: Every day | ORAL | Status: DC

## 2012-12-12 NOTE — Telephone Encounter (Signed)
Left mess for pt refill for lisinopril sent in, call back to sch f/u appt

## 2013-02-28 ENCOUNTER — Other Ambulatory Visit (HOSPITAL_COMMUNITY): Payer: Self-pay | Admitting: Internal Medicine

## 2013-03-02 ENCOUNTER — Other Ambulatory Visit: Payer: Self-pay

## 2013-03-02 MED ORDER — POTASSIUM CHLORIDE CRYS ER 10 MEQ PO TBCR: EXTENDED_RELEASE_TABLET | ORAL | Status: DC

## 2013-03-02 MED ORDER — HYDRALAZINE HCL 25 MG PO TABS: 25.0000 mg | ORAL_TABLET | Freq: Three times a day (TID) | ORAL | Status: DC

## 2013-03-02 MED ORDER — FUROSEMIDE 40 MG PO TABS: ORAL_TABLET | ORAL | Status: DC

## 2013-03-09 ENCOUNTER — Other Ambulatory Visit (HOSPITAL_COMMUNITY): Payer: Self-pay | Admitting: Cardiology

## 2013-03-09 DIAGNOSIS — I10 Essential (primary) hypertension: Secondary | ICD-10-CM

## 2013-03-09 DIAGNOSIS — I5022 Chronic systolic (congestive) heart failure: Secondary | ICD-10-CM

## 2013-03-09 MED ORDER — CARVEDILOL 25 MG PO TABS: 25.0000 mg | ORAL_TABLET | Freq: Two times a day (BID) | ORAL | Status: DC

## 2013-03-09 MED ORDER — HYDRALAZINE HCL 25 MG PO TABS: 25.0000 mg | ORAL_TABLET | Freq: Three times a day (TID) | ORAL | Status: DC

## 2013-03-09 MED ORDER — LISINOPRIL 40 MG PO TABS: 40.0000 mg | ORAL_TABLET | Freq: Every day | ORAL | Status: DC

## 2013-03-09 MED ORDER — FUROSEMIDE 40 MG PO TABS: ORAL_TABLET | ORAL | Status: DC

## 2013-03-09 MED ORDER — FELODIPINE ER 2.5 MG PO TB24: 2.5000 mg | ORAL_TABLET | Freq: Every day | ORAL | Status: DC

## 2013-03-09 MED ORDER — SPIRONOLACTONE 25 MG PO TABS: 50.0000 mg | ORAL_TABLET | Freq: Every day | ORAL | Status: DC

## 2013-03-09 MED ORDER — POTASSIUM CHLORIDE CRYS ER 10 MEQ PO TBCR: EXTENDED_RELEASE_TABLET | ORAL | Status: DC

## 2013-03-09 NOTE — Telephone Encounter (Signed)
Pt called for appt  () 03/14/13() And refills Advised pt NO ADDITIONAL REFILLS WILL BE GIVEN UNTIL PATIENT IS SEEN  Requested Prescriptions   Signed Prescriptions Disp Refills  . spironolactone (ALDACTONE) 25 MG tablet 60 tablet 0    Sig: Take 2 tablets (50 mg total) by mouth daily. MUST MAKE FOLLOW UP APPOINTMENT FOR FURTHER REFILLS    Authorizing Provider: Dolores Patty    Ordering User: JEFFRIES, CHANTEL M  . potassium chloride (KLOR-CON M10) 10 MEQ tablet 30 tablet 0    Sig: TAKE ONE TABLET BY MOUTH EVERY DAY    Authorizing Provider: Dolores Patty    Ordering User: JEFFRIES, CHANTEL M  . carvedilol (COREG) 25 MG tablet 60 tablet 0    Sig: Take 1 tablet (25 mg total) by mouth 2 (two) times daily with a meal.    Authorizing Provider: Dolores Patty    Ordering User: JEFFRIES, CHANTEL M  . furosemide (LASIX) 40 MG tablet 30 tablet 0    Sig: TAKE ONE TABLET BY MOUTH ONCE DAILY    Authorizing Provider: Dolores Patty    Ordering User: JEFFRIES, CHANTEL M  . lisinopril (PRINIVIL,ZESTRIL) 40 MG tablet 30 tablet 0    Sig: Take 1 tablet (40 mg total) by mouth daily.    Authorizing Provider: Dolores Patty    Ordering User: JEFFRIES, Milagros Reap  . hydrALAZINE (APRESOLINE) 25 MG tablet 90 tablet 0    Sig: Take 1 tablet (25 mg total) by mouth 3 (three) times daily.    Authorizing Provider: Dolores Patty    Ordering User: JEFFRIES, Milagros Reap  . felodipine (PLENDIL) 2.5 MG 24 hr tablet 30 tablet 0    Sig: Take 1 tablet (2.5 mg total) by mouth daily.    Authorizing Provider: Dolores Patty    Ordering User: JEFFRIES, Milagros Reap

## 2013-03-13 ENCOUNTER — Other Ambulatory Visit (HOSPITAL_COMMUNITY): Payer: Self-pay | Admitting: Internal Medicine

## 2013-03-14 ENCOUNTER — Encounter (HOSPITAL_COMMUNITY): Payer: Self-pay | Admitting: *Deleted

## 2013-03-14 ENCOUNTER — Other Ambulatory Visit (HOSPITAL_COMMUNITY): Payer: Self-pay | Admitting: *Deleted

## 2013-03-14 ENCOUNTER — Ambulatory Visit (HOSPITAL_COMMUNITY): Admission: RE | Admit: 2013-03-14 | Payer: 59 | Source: Ambulatory Visit

## 2013-03-14 DIAGNOSIS — I10 Essential (primary) hypertension: Secondary | ICD-10-CM

## 2013-03-14 MED ORDER — LISINOPRIL 20 MG PO TABS: 40.0000 mg | ORAL_TABLET | Freq: Every day | ORAL | Status: DC

## 2013-04-18 ENCOUNTER — Ambulatory Visit (HOSPITAL_COMMUNITY)
Admission: RE | Admit: 2013-04-18 | Discharge: 2013-04-18 | Disposition: A | Discharge status: Home / Self Care | Payer: 59 | Source: Ambulatory Visit | Attending: Internal Medicine | Admitting: Internal Medicine

## 2013-04-18 VITALS — BP 124/76 | HR 78 | Wt 249.5 lb

## 2013-04-18 DIAGNOSIS — Z79899 Other long term (current) drug therapy: Secondary | ICD-10-CM | POA: Insufficient documentation

## 2013-04-18 DIAGNOSIS — I10 Essential (primary) hypertension: Secondary | ICD-10-CM

## 2013-04-18 DIAGNOSIS — I5022 Chronic systolic (congestive) heart failure: Secondary | ICD-10-CM

## 2013-04-18 DIAGNOSIS — G4733 Obstructive sleep apnea (adult) (pediatric): Secondary | ICD-10-CM

## 2013-04-18 DIAGNOSIS — N529 Male erectile dysfunction, unspecified: Secondary | ICD-10-CM | POA: Insufficient documentation

## 2013-04-18 DIAGNOSIS — Z7982 Long term (current) use of aspirin: Secondary | ICD-10-CM | POA: Insufficient documentation

## 2013-04-18 DIAGNOSIS — E669 Obesity, unspecified: Secondary | ICD-10-CM

## 2013-04-18 DIAGNOSIS — I509 Heart failure, unspecified: Secondary | ICD-10-CM | POA: Insufficient documentation

## 2013-04-18 DIAGNOSIS — Z8673 Personal history of transient ischemic attack (TIA), and cerebral infarction without residual deficits: Secondary | ICD-10-CM | POA: Insufficient documentation

## 2013-04-18 LAB — BASIC METABOLIC PANEL
BUN: 14 mg/dL (ref 6–23)
CHLORIDE: 102 meq/L (ref 96–112)
CO2: 25 mEq/L (ref 19–32)
Calcium: 9.5 mg/dL (ref 8.4–10.5)
Creatinine, Ser: 1.11 mg/dL (ref 0.50–1.35)
GFR, EST AFRICAN AMERICAN: 90 mL/min — AB (ref >90)
GFR, EST NON AFRICAN AMERICAN: 77 mL/min — AB (ref >90)
Glucose, Bld: 118 mg/dL — ABNORMAL HIGH (ref 70–99)
Potassium: 4.3 mEq/L (ref 3.7–5.3)
SODIUM: 140 meq/L (ref 137–147)

## 2013-04-18 MED ORDER — FELODIPINE ER 2.5 MG PO TB24: 2.5000 mg | ORAL_TABLET | Freq: Every day | ORAL | Status: DC

## 2013-04-18 MED ORDER — SPIRONOLACTONE 25 MG PO TABS: 50.0000 mg | ORAL_TABLET | Freq: Every day | ORAL | Status: DC

## 2013-04-18 MED ORDER — HYDRALAZINE HCL 25 MG PO TABS: 25.0000 mg | ORAL_TABLET | Freq: Three times a day (TID) | ORAL | Status: DC

## 2013-04-18 MED ORDER — POTASSIUM CHLORIDE CRYS ER 10 MEQ PO TBCR: EXTENDED_RELEASE_TABLET | ORAL | Status: DC

## 2013-04-18 MED ORDER — CARVEDILOL 25 MG PO TABS: 25.0000 mg | ORAL_TABLET | Freq: Two times a day (BID) | ORAL | Status: DC

## 2013-04-18 MED ORDER — LISINOPRIL 40 MG PO TABS: 40.0000 mg | ORAL_TABLET | Freq: Every day | ORAL | Status: DC

## 2013-04-18 MED ORDER — SILDENAFIL CITRATE 100 MG PO TABS: 100.0000 mg | ORAL_TABLET | Freq: Every day | ORAL | Status: DC | PRN

## 2013-04-18 MED ORDER — FUROSEMIDE 40 MG PO TABS: ORAL_TABLET | ORAL | Status: DC

## 2013-04-18 NOTE — Patient Instructions (Addendum)
Doing great.  Continue to try and lose some weight. Try the Atkins Diet.  Will schedule an ECHO.  Follow up 1 year.  Call any issues.  Will call with recommendation for Primary Care Doctor  Do the following things EVERYDAY: 1) Weigh yourself in the morning before breakfast. Write it down and keep it in a log. 2) Take your medicines as prescribed 3) Eat low salt foods-Limit salt (sodium) to 2000 mg per day.  4) Stay as active as you can everyday 5) Limit all fluids for the day to less than 2 liters

## 2013-04-18 NOTE — Progress Notes (Signed)
Patient ID: Caleb Garcia, male   DOB: 1966-02-20, 48 y.o.   MRN: [id number]  PCP: N/A  HPI:  Caleb Garcia is a 48 year old, African American male, patient with history of HTN, obesity, HTN, OSA s/p UPPP, probable familial nonischemic cardiomyopathy (probably hypertensive), chronic systolic HF and previous occiptial stroke. Cardiac catheterization on October 08, 2008 showed no significant coronary artery disease with moderately severe global LV dysfunction. Ejection fraction 40% with moderate MR.  Echo in 12/2009 showed EF 55%.  No signifcant valvular abnormalities. Also had home sleep study. Very mild OSA with AHI 8.  Saw Dr. Shelle Ironlance and decided to keep off CPAP for now.   Follow up: Has not been seen since 07/2012.  Doing well. Denies SOB, orthopnea, PND, or CP. Denies edema. Weight 240-245 lbs. Weight fluctuates. Nothing stops him, able to go upstairs with no issues. Taking medications as prescribed. Following a low salt diet. [employer].    ROS: All systems negative except as listed in HPI, PMH and Problem List.  Past Medical History  Diagnosis Date  . Hypokalemia   . Systolic CHF     mon ischemic  . Glucose intolerance (impaired glucose tolerance)   . Cardiomyopathy   . Obesity   . OSA (obstructive sleep apnea)   . HTN (hypertension)   . OSA (obstructive sleep apnea)   . CHF (congestive heart failure)      Current Outpatient Prescriptions  Medication Sig Dispense Refill  . aspirin 81 MG tablet Take 81 mg by mouth daily.        . carvedilol (COREG) 25 MG tablet Take 1 tablet (25 mg total) by mouth 2 (two) times daily with a meal.  60 tablet  0  . felodipine (PLENDIL) 2.5 MG 24 hr tablet TAKE ONE TABLET  BY MOUTH EVERY DAY  30 tablet  3  . furosemide (LASIX) 40 MG tablet TAKE ONE TABLET BY MOUTH ONCE DAILY  30 tablet  0  . hydrALAZINE (APRESOLINE) 25 MG tablet Take 1 tablet (25 mg total) by mouth 3 (three) times daily.  90 tablet  0  . lisinopril (PRINIVIL,ZESTRIL) 20  MG tablet Take 2 tablets (40 mg total) by mouth daily. NO FURTHER REFILLS WILL BE GIVEN WITHOUT APPOINTMENT!!  60 tablet  0  . potassium chloride (KLOR-CON M10) 10 MEQ tablet TAKE ONE TABLET BY MOUTH EVERY DAY  30 tablet  0  . simvastatin (ZOCOR) 40 MG tablet TAKE ONE TABLET BY MOUTH AT BEDTIME  90 tablet  2  . spironolactone (ALDACTONE) 25 MG tablet Take 2 tablets (50 mg total) by mouth daily. MUST MAKE FOLLOW UP APPOINTMENT FOR FURTHER REFILLS  60 tablet  0   No current facility-administered medications for this encounter.    Filed Vitals:   04/18/13 0902  BP: 124/76  Pulse: 78  Weight: 249 lb 8 oz (113.172 kg)  SpO2: 98%    PHYSICAL EXAM: General:  Well appearing. No resp difficulty HEENT: normal Neck: supple. JVP flat. Carotids 2+ bilaterally; no bruits. No lymphadenopathy or thryomegaly appreciated. Cor: PMI normal. Regular rate & rhythm. No rubs, gallops or murmurs. Lungs: clear Abdomen: obese soft, nontender, nondistended. No hepatosplenomegaly. No bruits or masses. Good bowel sounds. Extremities: no cyanosis, clubbing, rash, edema Neuro: alert & orientedx3, cranial nerves grossly intact. Moves all 4 extremities w/o difficulty. R and L lower extremities equal strength.    ASSESSMENT & PLAN:  1) Chronic systolic HF: NICM, EF 55% (2011) - NYHA I symptoms and volume  status stable. Will continue lasix 40 mg daily. - SBP stable. On goal dose coreg and ACE-I. Continue hydralazine 25 mg TID, spiro 50 mg daily, felodipine 2.5 mg daily. He is not on nitrate d/t headaches.  - Will send refills on all prescriptions. -BMET today.  - Repeat ECHO in next few weeks. - Reinforced the need and importance of daily weights, a low sodium diet, and fluid restriction (less than 2 L a day). Instructed to call the HF clinic if weight increases more than 3 lbs overnight or 5 lbs in a week. 2) Obesity - Discussed increasing exercise and trying to watch his portion sizes. Discussed looking into  Atkins diet.  3) HTN - Controlled. Continue current regimen.  4) OSA - Mild on AHI scale. Not on CPAP and recommended losing some weight.  5) Erectile dysfunction - Reports medications are causing "issues." Do not want to take off HF medications. Will send prescription in for Viagra 100 mg tablets, #5. Reports he has had headaches in the past, but will try again. Discussed referral to urologist and at this time not interested.  Does not have PCP. Will call around to see who is taking new patients.  F/U 1 year Caleb Rud NP-C 10:18 AM

## 2013-05-02 ENCOUNTER — Ambulatory Visit (HOSPITAL_COMMUNITY)
Admission: RE | Admit: 2013-05-02 | Discharge: 2013-05-02 | Disposition: A | Discharge status: Home / Self Care | Payer: 59 | Source: Ambulatory Visit | Attending: Internal Medicine | Admitting: Internal Medicine

## 2013-05-02 DIAGNOSIS — I509 Heart failure, unspecified: Secondary | ICD-10-CM | POA: Insufficient documentation

## 2013-05-02 DIAGNOSIS — Z8249 Family history of ischemic heart disease and other diseases of the circulatory system: Secondary | ICD-10-CM | POA: Insufficient documentation

## 2013-05-02 DIAGNOSIS — I10 Essential (primary) hypertension: Secondary | ICD-10-CM | POA: Insufficient documentation

## 2013-05-02 DIAGNOSIS — I5022 Chronic systolic (congestive) heart failure: Secondary | ICD-10-CM

## 2013-05-02 DIAGNOSIS — I519 Heart disease, unspecified: Secondary | ICD-10-CM

## 2013-05-02 NOTE — Progress Notes (Signed)
  Echocardiogram 2D Echocardiogram has been performed.  Cathie Beams 05/02/2013, 12:19 PM

## 2013-05-28 ENCOUNTER — Encounter: Payer: Self-pay | Admitting: Licensed Clinical Social Worker

## 2013-05-28 NOTE — Progress Notes (Signed)
CSW referred to assist patient with obtaining a PCP. CSW contacted patient who states that he has [employer] and is in need of PCP. CSW provided PCP information and numbers for patient to contact to arrange for new patient appointment. Patient denies any other concerns at this time. Lasandra Beech, LCSW  [phone]

## 2013-06-04 ENCOUNTER — Encounter (HOSPITAL_COMMUNITY): Payer: Self-pay

## 2013-07-31 NOTE — Progress Notes (Signed)
Patient ID: Caleb Garcia, male   DOB: Jul 03, 1965, 48 y.o.   MRN: [id number]  PCP: None  HPI: Caleb Garcia is a 48 year old, African American male, patient with history of HTN, obesity, HTN, OSA s/p UPPP, probable familial nonischemic cardiomyopathy (probably hypertensive), chronic systolic HF and previous occiptial stroke. Cardiac catheterization on October 08, 2008 showed no significant coronary artery disease with moderately severe global LV dysfunction. Ejection fraction 40% with moderate MR. Very mild OSA with AHI 8. Saw Dr. Shelle Ironlance and decided to keep off CPAP for now.    He returns for follow up. Denies SOB, orthopnea, PND, or CP.  Able to walk up steps. He is not weighing home. Taking medications as prescribed. Not using CPAP. No exercising. Following a low salt diet. [employer].   ECHO 10/2008 25-30%  ECHO cho 02/2009 EF 35-40%   ECHO 04/2013 EF 35-40%    SH: Lives with his wife . Has alcohol on occasion. No drug use.  FH: Brother has HTN        Mom kidney transplant Dad throat cancer   ROS: All systems negative except as listed in HPI, PMH and Problem List.  Past Medical History  Diagnosis Date  . Hypokalemia   . Systolic CHF     mon ischemic  . Glucose intolerance (impaired glucose tolerance)   . Cardiomyopathy   . Obesity   . OSA (obstructive sleep apnea)   . HTN (hypertension)   . OSA (obstructive sleep apnea)   . CHF (congestive heart failure)      Current Outpatient Prescriptions  Medication Sig Dispense Refill  . aspirin 81 MG tablet Take 81 mg by mouth daily.        . carvedilol (COREG) 25 MG tablet Take 1 tablet (25 mg total) by mouth 2 (two) times daily with a meal.  60 tablet  6  . felodipine (PLENDIL) 2.5 MG 24 hr tablet Take 1 tablet (2.5 mg total) by mouth daily.  30 tablet  3  . furosemide (LASIX) 40 MG tablet TAKE ONE TABLET BY MOUTH ONCE DAILY  30 tablet  6  . hydrALAZINE (APRESOLINE) 25 MG tablet Take 1 tablet (25 mg total) by mouth 3  (three) times daily.  90 tablet  6  . lisinopril (PRINIVIL,ZESTRIL) 40 MG tablet Take 1 tablet (40 mg total) by mouth daily.  60 tablet  6  . potassium chloride (KLOR-CON M10) 10 MEQ tablet TAKE ONE TABLET BY MOUTH EVERY DAY  30 tablet  6  . sildenafil (VIAGRA) 100 MG tablet Take 1 tablet (100 mg total) by mouth daily as needed for erectile dysfunction.  5 tablet  1  . simvastatin (ZOCOR) 40 MG tablet TAKE ONE TABLET BY MOUTH AT BEDTIME  90 tablet  2  . spironolactone (ALDACTONE) 25 MG tablet Take 2 tablets (50 mg total) by mouth daily.  60 tablet  6  . sildenafil (VIAGRA) 100 MG tablet Take 1 tablet (100 mg total) by mouth daily as needed for erectile dysfunction.  20 tablet  3   No current facility-administered medications for this encounter.    Filed Vitals:   08/02/13 0854  BP: 134/82  Pulse: 77  Weight: 251 lb (113.853 kg)  SpO2: 99%    PHYSICAL EXAM: General:  Well appearing. No resp difficulty HEENT: normal Neck: supple. JVP flat. Carotids 2+ bilaterally; no bruits. No lymphadenopathy or thryomegaly appreciated. Cor: PMI normal. Regular rate & rhythm. No rubs, gallops or murmurs. Lungs: clear Abdomen: obese  soft, nontender, nondistended. No hepatosplenomegaly. No bruits or masses. Good bowel sounds. Extremities: no cyanosis, clubbing, rash, edema Neuro: alert & orientedx3, cranial nerves grossly intact. Moves all 4 extremities w/o difficulty. R and L lower extremities equal strength.    ASSESSMENT & PLAN:  1) Chronic systolic HF: NICM, EF 25-30%  (2010) but most recent ECHO remained 35-40% in January 2015.  - NYHA I symptoms.  and volume status stable. Will continue lasix 40 mg daily.  On goal dose coreg and ACE-I.  Continue hydralazine 25 mg TID, spiro 50 mg daily, felodipine 2.5 mg daily. He is not on nitrate due to headaches and takes viagra.  Check BMET - Reinforced the need and importance of daily weights, a low sodium diet, and fluid restriction (less than 2 L a  day). Instructed to call the HF clinic if weight increases more than 3 lbs overnight or 5 lbs in a week. 2) Obesity - Discussed increasing exercise and trying to watch his portion sizes.  3) HTN- Controlled. Continue current regimen.  4) OSA -- Mild on AHI scale. Not on CPAP per Dr Shelle Iron.   5) Erectile dysfunction- -Viagra ineffective. Refer to PCP   HF SW to help with referral for PCP  Follow up in 3 months   Amy D Clegg NP-C 9:02 AM

## 2013-08-01 ENCOUNTER — Ambulatory Visit (INDEPENDENT_AMBULATORY_CARE_PROVIDER_SITE_OTHER): Payer: 59 | Admitting: Family Medicine

## 2013-08-01 DIAGNOSIS — M25519 Pain in unspecified shoulder: Secondary | ICD-10-CM

## 2013-08-01 DIAGNOSIS — M7541 Impingement syndrome of right shoulder: Secondary | ICD-10-CM

## 2013-08-01 DIAGNOSIS — M25819 Other specified joint disorders, unspecified shoulder: Secondary | ICD-10-CM

## 2013-08-01 DIAGNOSIS — M7580 Other shoulder lesions, unspecified shoulder: Secondary | ICD-10-CM

## 2013-08-01 MED ORDER — METHYLPREDNISOLONE ACETATE 80 MG/ML IJ SUSP
80.0000 mg | Freq: Once | INTRAMUSCULAR | Status: AC
  Administered 2013-08-01: 80 mg via INTRAMUSCULAR

## 2013-08-01 NOTE — Progress Notes (Signed)
   Subjective:    Patient ID: Caleb Garcia, male    DOB: 1965-04-27, 48 y.o.   MRN: [id number]  Shoulder Pain    Bilateral shoulder pain x 2 months, right > left. No injury, but started after lifting heavy trash bags. Pain is at top and front of shoulder. Pain while sleeping at night. Pain exacerbated by raising arm overhead and sleeping on right side. Not tried anything. No radicular pain or numbness. No history of shoulder problems. No alleviating factors.  Review of Systems  All other systems reviewed and are negative.      Objective:   Physical Exam  Constitutional: He appears well-developed and well-nourished.  HENT:  Head: Normocephalic.  Eyes: Conjunctivae are normal. No scleral icterus.  Pulmonary/Chest: Effort normal.  Musculoskeletal:       Right shoulder: He exhibits pain. He exhibits normal range of motion, no tenderness, no bony tenderness, no swelling, no effusion, no crepitus, no deformity and normal strength.       Left shoulder: He exhibits pain. He exhibits normal range of motion, no tenderness, no bony tenderness, no swelling, no effusion, no crepitus, no deformity, no spasm and normal strength.  Skin: Skin is warm and dry.  Psychiatric: He has a normal mood and affect. His behavior is normal.  Bilat Shoulders: FROM with pain overhead. Strength 5/5 ER, empty can, belly press. Severe pain with [place]    Assessment & Plan:  #1. Bilateral shoulder impingement (right > left) - Refer to PT - HEP to start with band - Right shoulder subacromial injection  Consent obtained and verified. Sterile betadine prep. Furthur cleansed with alcohol. Topical analgesic spray: Ethyl chloride. Joint: Right subacromial Approached in typical fashion with: Completed without difficulty Meds: 4 cc 1% lidocaine, 1 cc depomedrol 80 Needle: 22 gauge 1.5 inch Aftercare instructions and Red flags advised.

## 2013-08-01 NOTE — Patient Instructions (Signed)
Thank you for coming in today  1. We injected your right shoulder today. If this helps and you have worsening left shoulder pain then in 1-2 weeks we can inject the left side 2. Start home exercises 3. Refer to physical therapy  Followup with Dr. Neomia Dear Weds nights   Impingement Syndrome, Rotator Cuff, Bursitis with Rehab Impingement syndrome is a condition that involves inflammation of the tendons of the rotator cuff and the subacromial bursa, that causes pain in the shoulder. The rotator cuff consists of four tendons and muscles that control much of the shoulder and upper arm function. The subacromial bursa is a fluid filled sac that helps reduce friction between the rotator cuff and one of the bones of the shoulder (acromion). Impingement syndrome is usually an overuse injury that causes swelling of the bursa (bursitis), swelling of the tendon (tendonitis), and/or a tear of the tendon (strain). Strains are classified into three categories. Grade 1 strains cause pain, but the tendon is not lengthened. Grade 2 strains include a lengthened ligament, due to the ligament being stretched or partially ruptured. With grade 2 strains there is still function, although the function may be decreased. Grade 3 strains include a complete tear of the tendon or muscle, and function is usually impaired. SYMPTOMS   Pain around the shoulder, often at the outer portion of the upper arm.  Pain that gets worse with shoulder function, especially when reaching overhead or lifting.  Sometimes, aching when not using the arm.  Pain that wakes you up at night.  Sometimes, tenderness, swelling, warmth, or redness over the affected area.  Loss of strength.  Limited motion of the shoulder, especially reaching behind the back (to the back pocket or to unhook bra) or across your body.  Crackling sound (crepitation) when moving the arm.  Biceps tendon pain and inflammation (in the front of the shoulder). Worse when  bending the elbow or lifting. CAUSES  Impingement syndrome is often an overuse injury, in which chronic (repetitive) motions cause the tendons or bursa to become inflamed. A strain occurs when a force is paced on the tendon or muscle that is greater than it can withstand. Common mechanisms of injury include: Stress from sudden increase in duration, frequency, or intensity of training.  Direct hit (trauma) to the shoulder.  Aging, erosion of the tendon with normal use.  Bony bump on shoulder (acromial spur). RISK INCREASES WITH:  Contact sports (football, wrestling, boxing).  Throwing sports (baseball, tennis, volleyball).  Weightlifting and bodybuilding.  Heavy labor.  Previous injury to the rotator cuff, including impingement.  Poor shoulder strength and flexibility.  Failure to warm up properly before activity.  Inadequate protective equipment.  Old age.  Bony bump on shoulder (acromial spur). PREVENTION   Warm up and stretch properly before activity.  Allow for adequate recovery between workouts.  Maintain physical fitness:  Strength, flexibility, and endurance.  Cardiovascular fitness.  Learn and use proper exercise technique. PROGNOSIS  If treated properly, impingement syndrome usually goes away within 6 weeks. Sometimes surgery is required.  RELATED COMPLICATIONS   Longer healing time if not properly treated, or if not given enough time to heal.  Recurring symptoms, that result in a chronic condition.  Shoulder stiffness, frozen shoulder, or loss of motion.  Rotator cuff tendon tear.  Recurring symptoms, especially if activity is resumed too soon, with overuse, with a direct blow, or when using poor technique. TREATMENT  Treatment first involves the use of ice and medicine, to reduce  pain and inflammation. The use of strengthening and stretching exercises may help reduce pain with activity. These exercises may be performed at home or with a therapist.  If non-surgical treatment is unsuccessful after more than 6 months, surgery may be advised. After surgery and rehabilitation, activity is usually possible in 3 months.  MEDICATION  If pain medicine is needed, nonsteroidal anti-inflammatory medicines (aspirin and ibuprofen), or other minor pain relievers (acetaminophen), are often advised.  Do not take pain medicine for 7 days before surgery.  Prescription pain relievers may be given, if your caregiver thinks they are needed. Use only as directed and only as much as you need.  Corticosteroid injections may be given by your caregiver. These injections should be reserved for the most serious cases, because they may only be given a certain number of times. HEAT AND COLD  Cold treatment (icing) should be applied for 10 to 15 minutes every 2 to 3 hours for inflammation and pain, and immediately after activity that aggravates your symptoms. Use ice packs or an ice massage.  Heat treatment may be used before performing stretching and strengthening activities prescribed by your caregiver, physical therapist, or athletic trainer. Use a heat pack or a warm water soak. SEEK MEDICAL CARE IF:   Symptoms get worse or do not improve in 4 to 6 weeks, despite treatment.  New, unexplained symptoms develop. (Drugs used in treatment may produce side effects.) EXERCISES  RANGE OF MOTION (ROM) AND STRETCHING EXERCISES - Impingement Syndrome (Rotator Cuff  Tendinitis, Bursitis) These exercises may help you when beginning to rehabilitate your injury. Your symptoms may go away with or without further involvement from your physician, physical therapist or athletic trainer. While completing these exercises, remember:   Restoring tissue flexibility helps normal motion to return to the joints. This allows healthier, less painful movement and activity.  An effective stretch should be held for at least 30 seconds.  A stretch should never be painful. You should only  feel a gentle lengthening or release in the stretched tissue. STRETCH  Flexion, Standing  Stand with good posture. With an underhand grip on your right / left hand, and an overhand grip on the opposite hand, grasp a broomstick or cane so that your hands are a little more than shoulder width apart.  Keeping your right / left elbow straight and shoulder muscles relaxed, push the stick with your opposite hand, to raise your right / left arm in front of your body and then overhead. Raise your arm until you feel a stretch in your right / left shoulder, but before you have increased shoulder pain.  Try to avoid shrugging your right / left shoulder as your arm rises, by keeping your shoulder blade tucked down and toward your mid-back spine. Hold for __________ seconds.  Slowly return to the starting position. Repeat __________ times. Complete this exercise __________ times per day. STRETCH  Abduction, Supine  Lie on your back. With an underhand grip on your right / left hand and an overhand grip on the opposite hand, grasp a broomstick or cane so that your hands are a little more than shoulder width apart.  Keeping your right / left elbow straight and your shoulder muscles relaxed, push the stick with your opposite hand, to raise your right / left arm out to the side of your body and then overhead. Raise your arm until you feel a stretch in your right / left shoulder, but before you have increased shoulder pain.  Try to  avoid shrugging your right / left shoulder as your arm rises, by keeping your shoulder blade tucked down and toward your mid-back spine. Hold for __________ seconds.  Slowly return to the starting position. Repeat __________ times. Complete this exercise __________ times per day. ROM  Flexion, Active-Assisted  Lie on your back. You may bend your knees for comfort.  Grasp a broomstick or cane so your hands are about shoulder width apart. Your right / left hand should grip the end of  the stick, so that your hand is positioned "thumbs-up," as if you were about to shake hands.  Using your healthy arm to lead, raise your right / left arm overhead, until you feel a gentle stretch in your shoulder. Hold for __________ seconds.  Use the stick to assist in returning your right / left arm to its starting position. Repeat __________ times. Complete this exercise __________ times per day.  ROM - Internal Rotation, Supine   Lie on your back on a firm surface. Place your right / left elbow about 60 degrees away from your side. Elevate your elbow with a folded towel, so that the elbow and shoulder are the same height.  Using a broomstick or cane and your strong arm, pull your right / left hand toward your body until you feel a gentle stretch, but no increase in your shoulder pain. Keep your shoulder and elbow in place throughout the exercise.  Hold for __________ seconds. Slowly return to the starting position. Repeat __________ times. Complete this exercise __________ times per day. STRETCH - Internal Rotation  Place your right / left hand behind your back, palm up.  Throw a towel or belt over your opposite shoulder. Grasp the towel with your right / left hand.  While keeping an upright posture, gently pull up on the towel, until you feel a stretch in the front of your right / left shoulder.  Avoid shrugging your right / left shoulder as your arm rises, by keeping your shoulder blade tucked down and toward your mid-back spine.  Hold for __________ seconds. Release the stretch, by lowering your healthy hand. Repeat __________ times. Complete this exercise __________ times per day. ROM - Internal Rotation   Using an underhand grip, grasp a stick behind your back with both hands.  While standing upright with good posture, slide the stick up your back until you feel a mild stretch in the front of your shoulder.  Hold for __________ seconds. Slowly return to your starting  position. Repeat __________ times. Complete this exercise __________ times per day.  STRETCH  Posterior Shoulder Capsule   Stand or sit with good posture. Grasp your right / left elbow and draw it across your chest, keeping it at the same height as your shoulder.  Pull your elbow, so your upper arm comes in closer to your chest. Pull until you feel a gentle stretch in the back of your shoulder.  Hold for __________ seconds. Repeat __________ times. Complete this exercise __________ times per day. STRENGTHENING EXERCISES - Impingement Syndrome (Rotator Cuff Tendinitis, Bursitis) These exercises may help you when beginning to rehabilitate your injury. They may resolve your symptoms with or without further involvement from your physician, physical therapist or athletic trainer. While completing these exercises, remember:  Muscles can gain both the endurance and the strength needed for everyday activities through controlled exercises.  Complete these exercises as instructed by your physician, physical therapist or athletic trainer. Increase the resistance and repetitions only as guided.  You  may experience muscle soreness or fatigue, but the pain or discomfort you are trying to eliminate should never worsen during these exercises. If this pain does get worse, stop and make sure you are following the directions exactly. If the pain is still present after adjustments, discontinue the exercise until you can discuss the trouble with your clinician.  During your recovery, avoid activity or exercises which involve actions that place your injured hand or elbow above your head or behind your back or head. These positions stress the tissues which you are trying to heal. STRENGTH - Scapular Depression and Adduction   With good posture, sit on a firm chair. Support your arms in front of you, with pillows, arm rests, or on a table top. Have your elbows in line with the sides of your body.  Gently draw your  shoulder blades down and toward your mid-back spine. Gradually increase the tension, without tensing the muscles along the top of your shoulders and the back of your neck.  Hold for __________ seconds. Slowly release the tension and relax your muscles completely before starting the next repetition.  After you have practiced this exercise, remove the arm support and complete the exercise in standing as well as sitting position. Repeat __________ times. Complete this exercise __________ times per day.  STRENGTH - Shoulder Abductors, Isometric  With good posture, stand or sit about 4-6 inches from a wall, with your right / left side facing the wall.  Bend your right / left elbow. Gently press your right / left elbow into the wall. Increase the pressure gradually, until you are pressing as hard as you can, without shrugging your shoulder or increasing any shoulder discomfort.  Hold for __________ seconds.  Release the tension slowly. Relax your shoulder muscles completely before you begin the next repetition. Repeat __________ times. Complete this exercise __________ times per day.  STRENGTH - External Rotators, Isometric  Keep your right / left elbow at your side and bend it 90 degrees.  Step into a door frame so that the outside of your right / left wrist can press against the door frame without your upper arm leaving your side.  Gently press your right / left wrist into the door frame, as if you were trying to swing the back of your hand away from your stomach. Gradually increase the tension, until you are pressing as hard as you can, without shrugging your shoulder or increasing any shoulder discomfort.  Hold for __________ seconds.  Release the tension slowly. Relax your shoulder muscles completely before you begin the next repetition. Repeat __________ times. Complete this exercise __________ times per day.  STRENGTH - Supraspinatus   Stand or sit with good posture. Grasp a __________  weight, or an exercise band or tubing, so that your hand is "thumbs-up," like you are shaking hands.  Slowly lift your right / left arm in a "V" away from your thigh, diagonally into the space between your side and straight ahead. Lift your hand to shoulder height or as far as you can, without increasing any shoulder pain. At first, many people do not lift their hands above shoulder height.  Avoid shrugging your right / left shoulder as your arm rises, by keeping your shoulder blade tucked down and toward your mid-back spine.  Hold for __________ seconds. Control the descent of your hand, as you slowly return to your starting position. Repeat __________ times. Complete this exercise __________ times per day.  STRENGTH - External Rotators  Secure  a rubber exercise band or tubing to a fixed object (table, pole) so that it is at the same height as your right / left elbow when you are standing or sitting on a firm surface.  Stand or sit so that the secured exercise band is at your uninjured side.  Bend your right / left elbow 90 degrees. Place a folded towel or small pillow under your right / left arm, so that your elbow is a few inches away from your side.  Keeping the tension on the exercise band, pull it away from your body, as if pivoting on your elbow. Be sure to keep your body steady, so that the movement is coming only from your rotating shoulder.  Hold for __________ seconds. Release the tension in a controlled manner, as you return to the starting position. Repeat __________ times. Complete this exercise __________ times per day.  STRENGTH - Internal Rotators   Secure a rubber exercise band or tubing to a fixed object (table, pole) so that it is at the same height as your right / left elbow when you are standing or sitting on a firm surface.  Stand or sit so that the secured exercise band is at your right / left side.  Bend your elbow 90 degrees. Place a folded towel or small pillow  under your right / left arm so that your elbow is a few inches away from your side.  Keeping the tension on the exercise band, pull it across your body, toward your stomach. Be sure to keep your body steady, so that the movement is coming only from your rotating shoulder.  Hold for __________ seconds. Release the tension in a controlled manner, as you return to the starting position. Repeat __________ times. Complete this exercise __________ times per day.  STRENGTH - Scapular Protractors, Standing   Stand arms length away from a wall. Place your hands on the wall, keeping your elbows straight.  Begin by dropping your shoulder blades down and toward your mid-back spine.  To strengthen your protractors, keep your shoulder blades down, but slide them forward on your rib cage. It will feel as if you are lifting the back of your rib cage away from the wall. This is a subtle motion and can be challenging to complete. Ask your caregiver for further instruction, if you are not sure you are doing the exercise correctly.  Hold for __________ seconds. Slowly return to the starting position, resting the muscles completely before starting the next repetition. Repeat __________ times. Complete this exercise __________ times per day. STRENGTH - Scapular Protractors, Supine  Lie on your back on a firm surface. Extend your right / left arm straight into the air while holding a __________ weight in your hand.  Keeping your head and back in place, lift your shoulder off the floor.  Hold for __________ seconds. Slowly return to the starting position, and allow your muscles to relax completely before starting the next repetition. Repeat __________ times. Complete this exercise __________ times per day. STRENGTH - Scapular Protractors, Quadruped  Get onto your hands and knees, with your shoulders directly over your hands (or as close as you can be, comfortably).  Keeping your elbows locked, lift the back of  your rib cage up into your shoulder blades, so your mid-back rounds out. Keep your neck muscles relaxed.  Hold this position for __________ seconds. Slowly return to the starting position and allow your muscles to relax completely before starting the next repetition. Repeat  __________ times. Complete this exercise __________ times per day.  STRENGTH - Scapular Retractors  Secure a rubber exercise band or tubing to a fixed object (table, pole), so that it is at the height of your shoulders when you are either standing, or sitting on a firm armless chair.  With a palm down grip, grasp an end of the band in each hand. Straighten your elbows and lift your hands straight in front of you, at shoulder height. Step back, away from the secured end of the band, until it becomes tense.  Squeezing your shoulder blades together, draw your elbows back toward your sides, as you bend them. Keep your upper arms lifted away from your body throughout the exercise.  Hold for __________ seconds. Slowly ease the tension on the band, as you reverse the directions and return to the starting position. Repeat __________ times. Complete this exercise __________ times per day. STRENGTH - Shoulder Extensors   Secure a rubber exercise band or tubing to a fixed object (table, pole) so that it is at the height of your shoulders when you are either standing, or sitting on a firm armless chair.  With a thumbs-up grip, grasp an end of the band in each hand. Straighten your elbows and lift your hands straight in front of you, at shoulder height. Step back, away from the secured end of the band, until it becomes tense.  Squeezing your shoulder blades together, pull your hands down to the sides of your thighs. Do not allow your hands to go behind you.  Hold for __________ seconds. Slowly ease the tension on the band, as you reverse the directions and return to the starting position. Repeat __________ times. Complete this exercise  __________ times per day.  STRENGTH - Scapular Retractors and External Rotators   Secure a rubber exercise band or tubing to a fixed object (table, pole) so that it is at the height as your shoulders, when you are either standing, or sitting on a firm armless chair.  With a palm down grip, grasp an end of the band in each hand. Bend your elbows 90 degrees and lift your elbows to shoulder height, at your sides. Step back, away from the secured end of the band, until it becomes tense.  Squeezing your shoulder blades together, rotate your shoulders so that your upper arms and elbows remain stationary, but your fists travel upward to head height.  Hold for __________ seconds. Slowly ease the tension on the band, as you reverse the directions and return to the starting position. Repeat __________ times. Complete this exercise __________ times per day.  STRENGTH - Scapular Retractors and External Rotators, Rowing   Secure a rubber exercise band or tubing to a fixed object (table, pole) so that it is at the height of your shoulders, when you are either standing, or sitting on a firm armless chair.  With a palm down grip, grasp an end of the band in each hand. Straighten your elbows and lift your hands straight in front of you, at shoulder height. Step back, away from the secured end of the band, until it becomes tense.  Step 1: Squeeze your shoulder blades together. Bending your elbows, draw your hands to your chest, as if you are rowing a boat. At the end of this motion, your hands and elbow should be at shoulder height and your elbows should be out to your sides.  Step 2: Rotate your shoulders, to raise your hands above your head. Your forearms  should be vertical and your upper arms should be horizontal.  Hold for __________ seconds. Slowly ease the tension on the band, as you reverse the directions and return to the starting position. Repeat __________ times. Complete this exercise __________ times  per day.  STRENGTH  Scapular Depressors  Find a sturdy chair without wheels, such as a dining room chair.  Keeping your feet on the floor, and your hands on the chair arms, lift your bottom up from the seat, and lock your elbows.  Keeping your elbows straight, allow gravity to pull your body weight down. Your shoulders will rise toward your ears.  Raise your body against gravity by drawing your shoulder blades down your back, shortening the distance between your shoulders and ears. Although your feet should always maintain contact with the floor, your feet should progressively support less body weight, as you get stronger.  Hold for __________ seconds. In a controlled and slow manner, lower your body weight to begin the next repetition. Repeat __________ times. Complete this exercise __________ times per day.  Document Released: 03/22/2005 Document Revised: 06/14/2011 Document Reviewed: 07/04/2008 [facility] Patient Information 2014 [place], [place].

## 2013-08-02 ENCOUNTER — Ambulatory Visit (HOSPITAL_COMMUNITY)
Admission: RE | Admit: 2013-08-02 | Discharge: 2013-08-02 | Disposition: A | Discharge status: Home / Self Care | Payer: 59 | Source: Ambulatory Visit | Attending: Internal Medicine | Admitting: Internal Medicine

## 2013-08-02 ENCOUNTER — Encounter: Payer: Self-pay | Admitting: Licensed Clinical Social Worker

## 2013-08-02 VITALS — BP 134/82 | HR 77 | Wt 251.0 lb

## 2013-08-02 DIAGNOSIS — G4733 Obstructive sleep apnea (adult) (pediatric): Secondary | ICD-10-CM | POA: Insufficient documentation

## 2013-08-02 DIAGNOSIS — N529 Male erectile dysfunction, unspecified: Secondary | ICD-10-CM

## 2013-08-02 DIAGNOSIS — I10 Essential (primary) hypertension: Secondary | ICD-10-CM | POA: Insufficient documentation

## 2013-08-02 DIAGNOSIS — I509 Heart failure, unspecified: Secondary | ICD-10-CM

## 2013-08-02 DIAGNOSIS — E669 Obesity, unspecified: Secondary | ICD-10-CM | POA: Insufficient documentation

## 2013-08-02 DIAGNOSIS — I5022 Chronic systolic (congestive) heart failure: Secondary | ICD-10-CM | POA: Insufficient documentation

## 2013-08-02 LAB — BASIC METABOLIC PANEL
BUN: 13 mg/dL (ref 6–23)
CO2: 23 meq/L (ref 19–32)
Calcium: 9.6 mg/dL (ref 8.4–10.5)
Chloride: 99 mEq/L (ref 96–112)
Creatinine, Ser: 0.79 mg/dL (ref 0.50–1.35)
GFR calc non Af Amer: >90 mL/min (ref >90)
Glucose, Bld: 148 mg/dL — ABNORMAL HIGH (ref 70–99)
POTASSIUM: 4.3 meq/L (ref 3.7–5.3)
Sodium: 137 mEq/L (ref 137–147)

## 2013-08-02 NOTE — Patient Instructions (Signed)
Follow up in 3 months with Dr Bensimhon   Do the following things EVERYDAY: 1) Weigh yourself in the morning before breakfast. Write it down and keep it in a log. 2) Take your medicines as prescribed 3) Eat low salt foods-Limit salt (sodium) to 2000 mg per day.  4) Stay as active as you can everyday 5) Limit all fluids for the day to less than 2 liters 

## 2013-08-02 NOTE — Addendum Note (Signed)
Encounter addended by: Ave Filter, RN on: 08/02/2013  9:53 AM<BR>     Documentation filed: Visit Diagnoses, Orders

## 2013-08-02 NOTE — Progress Notes (Signed)
CSW referred to assist patient with PCP. Patient has [employer] and CSW contacted [employer] at patient's request to confirm insurance and new patient availability. CSW provided patient with information and patient will contact office to make appointment. No further needs at this time. Lasandra Beech, LCSW [phone]

## 2013-10-31 ENCOUNTER — Encounter: Payer: Self-pay | Admitting: Internal Medicine

## 2013-10-31 ENCOUNTER — Encounter (HOSPITAL_COMMUNITY): Payer: Self-pay | Admitting: Vascular Surgery

## 2013-10-31 ENCOUNTER — Ambulatory Visit (INDEPENDENT_AMBULATORY_CARE_PROVIDER_SITE_OTHER): Payer: 59 | Admitting: Internal Medicine

## 2013-10-31 VITALS — BP 124/74 | HR 80 | Temp 98.2°F | Ht 68.5 in | Wt 259.0 lb

## 2013-10-31 DIAGNOSIS — M25512 Pain in left shoulder: Secondary | ICD-10-CM | POA: Insufficient documentation

## 2013-10-31 DIAGNOSIS — Z23 Encounter for immunization: Secondary | ICD-10-CM

## 2013-10-31 DIAGNOSIS — Z8639 Personal history of other endocrine, nutritional and metabolic disease: Secondary | ICD-10-CM

## 2013-10-31 DIAGNOSIS — I428 Other cardiomyopathies: Secondary | ICD-10-CM

## 2013-10-31 DIAGNOSIS — Z862 Personal history of diseases of the blood and blood-forming organs and certain disorders involving the immune mechanism: Secondary | ICD-10-CM

## 2013-10-31 DIAGNOSIS — Z125 Encounter for screening for malignant neoplasm of prostate: Secondary | ICD-10-CM

## 2013-10-31 DIAGNOSIS — I10 Essential (primary) hypertension: Secondary | ICD-10-CM

## 2013-10-31 DIAGNOSIS — N521 Erectile dysfunction due to diseases classified elsewhere: Secondary | ICD-10-CM

## 2013-10-31 DIAGNOSIS — N529 Male erectile dysfunction, unspecified: Secondary | ICD-10-CM

## 2013-10-31 DIAGNOSIS — E785 Hyperlipidemia, unspecified: Secondary | ICD-10-CM

## 2013-10-31 DIAGNOSIS — M25511 Pain in right shoulder: Secondary | ICD-10-CM

## 2013-10-31 DIAGNOSIS — M25519 Pain in unspecified shoulder: Secondary | ICD-10-CM

## 2013-10-31 DIAGNOSIS — N644 Mastodynia: Secondary | ICD-10-CM

## 2013-10-31 LAB — LIPID PANEL
Cholesterol: 184 mg/dL (ref 0–200)
HDL: 38.4 mg/dL — AB (ref >39.00)
NONHDL: 145.6
Total CHOL/HDL Ratio: 5
Triglycerides: 396 mg/dL — ABNORMAL HIGH (ref 0.0–149.0)
VLDL: 79.2 mg/dL — ABNORMAL HIGH (ref 0.0–40.0)

## 2013-10-31 LAB — CBC WITH DIFFERENTIAL/PLATELET
Basophils Absolute: 0 10*3/uL (ref 0.0–0.1)
Basophils Relative: 0.4 % (ref 0.0–3.0)
EOS ABS: 0.1 10*3/uL (ref 0.0–0.7)
Eosinophils Relative: 1.5 % (ref 0.0–5.0)
HCT: 39.1 % (ref 39.0–52.0)
HEMOGLOBIN: 13.2 g/dL (ref 13.0–17.0)
Lymphocytes Relative: 40.3 % (ref 12.0–46.0)
Lymphs Abs: 2.6 10*3/uL (ref 0.7–4.0)
MCHC: 33.7 g/dL (ref 30.0–36.0)
MCV: 90 fl (ref 78.0–100.0)
MONO ABS: 0.7 10*3/uL (ref 0.1–1.0)
Monocytes Relative: 10.7 % (ref 3.0–12.0)
NEUTROS ABS: 3.1 10*3/uL (ref 1.4–7.7)
Neutrophils Relative %: 47.1 % (ref 43.0–77.0)
Platelets: 237 10*3/uL (ref 150.0–400.0)
RBC: 4.35 Mil/uL (ref 4.22–5.81)
RDW: 14.6 % (ref 11.5–15.5)
WBC: 6.5 10*3/uL (ref 4.0–10.5)

## 2013-10-31 LAB — BASIC METABOLIC PANEL
BUN: 16 mg/dL (ref 6–23)
CHLORIDE: 103 meq/L (ref 96–112)
CO2: 27 meq/L (ref 19–32)
Calcium: 9.6 mg/dL (ref 8.4–10.5)
Creatinine, Ser: 1.1 mg/dL (ref 0.4–1.5)
GFR: 90.92 mL/min (ref >60.00)
GLUCOSE: 102 mg/dL — AB (ref 70–99)
Potassium: 4 mEq/L (ref 3.5–5.1)
SODIUM: 136 meq/L (ref 135–145)

## 2013-10-31 LAB — HEMOGLOBIN A1C: Hgb A1c MFr Bld: 7.2 % — ABNORMAL HIGH (ref 4.6–6.5)

## 2013-10-31 LAB — SEDIMENTATION RATE: SED RATE: 4 mm/h (ref 0–22)

## 2013-10-31 LAB — IBC PANEL
IRON: 74 ug/dL (ref 42–165)
Saturation Ratios: 23.7 % (ref 20.0–50.0)
TRANSFERRIN: 222.9 mg/dL (ref 212.0–360.0)

## 2013-10-31 LAB — FERRITIN: FERRITIN: 294 ng/mL (ref 22.0–322.0)

## 2013-10-31 LAB — HEPATIC FUNCTION PANEL
ALBUMIN: 4.1 g/dL (ref 3.5–5.2)
ALT: 29 U/L (ref 0–53)
AST: 23 U/L (ref 0–37)
Alkaline Phosphatase: 30 U/L — ABNORMAL LOW (ref 39–117)
Bilirubin, Direct: 0 mg/dL (ref 0.0–0.3)
Total Bilirubin: 0.5 mg/dL (ref 0.2–1.2)
Total Protein: 7.6 g/dL (ref 6.0–8.3)

## 2013-10-31 LAB — TSH: TSH: 0.69 u[IU]/mL (ref 0.35–4.50)

## 2013-10-31 LAB — CK: Total CK: 351 U/L — ABNORMAL HIGH (ref 7–232)

## 2013-10-31 LAB — LDL CHOLESTEROL, DIRECT: Direct LDL: 85.8 mg/dL

## 2013-10-31 NOTE — Progress Notes (Signed)
Subjective:    Patient ID: Caleb Garcia, male    DOB: [birth year], 48 y.o.   MRN: [id number]  HPI  48 year old African American male with history of hypertension, nonischemic cardiomyopathy (presumed hypertensive), obstructive sleep apnea and glucose intolerance to establish. Patient has been seen by urgent care physicians in the past. He has not had PCP.  Patient's nonischemic cardiomyopathy is stable. Patient noted have cardiac catheterization in 2010 which was negative for significant coronary artery disease. He has moderately severe global LV dysfunction with EF of 40% with moderate MR.  Patient complains of right breast tenderness/pain over last 1 month. There is slight firm area below the nipple.  He also complains of bilateral shoulder pain. He works as a Consulting civil engineerfront end loader operator but his job does not involve significant lifting. 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 Patient reports his right shoulder was injected a month ago at urgent care without significant improvement. He denies any lower extremity proximal muscle weakness.  Patient also complains of erectile dysfunction. He has decreased libido and has intermittent hot flashes.   Review of Systems  Constitutional: Negative for activity change, appetite change and unexpected weight change.  Eyes: Negative for visual disturbance.  Respiratory: Negative for cough, chest tightness and shortness of breath.   Cardiovascular: Negative for chest pain.  Genitourinary: Negative for difficulty urinating.  Neurological: Negative for headaches.  Gastrointestinal: Negative for abdominal pain, heartburn melena or hematochezia Psych: Negative for depression or anxiety Endo:  Decreased libido, Erectile dysfunction, hot flashes Musculoskeletal: bilateral shoulder pain        Past Medical History  Diagnosis Date  . Hypokalemia   . Systolic CHF     mon ischemic  . Glucose intolerance (impaired glucose  tolerance)   . Cardiomyopathy   . Obesity   . OSA (obstructive sleep apnea)   . HTN (hypertension)   . OSA (obstructive sleep apnea)   . CHF (congestive heart failure)     History   Social History  . Marital Status: Married    Spouse Name: N/A    Number of Children: N/A  . Years of Education: N/A   Occupational History  .  Unemployed   Social History Main Topics  . Smoking status: Never Smoker   . Smokeless tobacco: Not on file  . Alcohol Use: No  . Drug Use: No  . Sexual Activity: Not on file   Other Topics Concern  . Not on file   Social History Narrative  . No narrative on file    Past Surgical History  Procedure Laterality Date  . Cardiac catheterization  10/08/08  . Tonsillectomy      Family History  Problem Relation Age of Onset  . Cancer    . Diabetes    . Hypertension    . Heart disease Mother   . Hypertension Mother   . Diabetes Mother   . Cancer Father   . Stroke Father     No Known Allergies  Current Outpatient Prescriptions on File Prior to Visit  Medication Sig Dispense Refill  . aspirin 81 MG tablet Take 81 mg by mouth daily.        . carvedilol (COREG) 25 MG tablet Take 1 tablet (25 mg total) by mouth 2 (two) times daily with a meal.  60 tablet  6  . felodipine (PLENDIL) 2.5 MG 24 hr tablet Take 1 tablet (2.5 mg total) by mouth daily.  30 tablet  3  .  furosemide (LASIX) 40 MG tablet TAKE ONE TABLET BY MOUTH ONCE DAILY  30 tablet  6  . hydrALAZINE (APRESOLINE) 25 MG tablet Take 1 tablet (25 mg total) by mouth 3 (three) times daily.  90 tablet  6  . lisinopril (PRINIVIL,ZESTRIL) 40 MG tablet Take 1 tablet (40 mg total) by mouth daily.  60 tablet  6  . potassium chloride (KLOR-CON M10) 10 MEQ tablet TAKE ONE TABLET BY MOUTH EVERY DAY  30 tablet  6  . simvastatin (ZOCOR) 40 MG tablet TAKE ONE TABLET BY MOUTH AT BEDTIME  90 tablet  2  . spironolactone (ALDACTONE) 25 MG tablet Take 2 tablets (50 mg total) by mouth daily.  60 tablet  6   No  current facility-administered medications on file prior to visit.    BP 124/74  Pulse 80  Temp(Src) 98.2 F (36.8 C) (Oral)  Ht 5' 8.5" (1.74 m)  Wt 259 lb (117.482 kg)  BMI 38.80 kg/m2    Objective:   Physical Exam  Constitutional: He is oriented to person, place, and time. He appears well-developed and well-nourished. No distress.  HENT:  Head: Normocephalic and atraumatic.  Right Ear: External ear normal.  Left Ear: External ear normal.  Mouth/Throat: Oropharynx is clear and moist.  Eyes: Conjunctivae and EOM are normal. Pupils are equal, round, and reactive to light. No scleral icterus.  Neck: Neck supple.  No carotid bruit  Cardiovascular: Normal rate, regular rhythm, normal heart sounds and intact distal pulses.   No murmur heard. Pulmonary/Chest: Effort normal and breath sounds normal. He has no wheezes.  Abdominal: Soft. Bowel sounds are normal. He exhibits no mass. There is no tenderness.  abdominal obesity  Musculoskeletal: He exhibits no edema.  Bilateral shoulder pain near a.c. Joint.  Pain with resisted shoulder abduction  Lymphadenopathy:    He has no cervical adenopathy.  Neurological: He is alert and oriented to person, place, and time. No cranial nerve deficit.  Skin: Skin is warm and dry.  Psychiatric: He has a normal mood and affect. His behavior is normal.          Assessment & Plan:

## 2013-10-31 NOTE — Assessment & Plan Note (Signed)
I suspect patient has bilateral rotator cuff tendinitis. Refer to Dr. Katrinka Blazing for further evaluation. Patient does not had any other symptoms to suggest PMR. Check sedimentation rate and CK.

## 2013-10-31 NOTE — Assessment & Plan Note (Signed)
Monitor A1c.  I strongly encouraged weight loss and carb modified diet.

## 2013-10-31 NOTE — Assessment & Plan Note (Signed)
Stable. Patient appears euvolemic.  He is asymptomatic

## 2013-10-31 NOTE — Assessment & Plan Note (Signed)
48 year old male with one month of left breast tenderness. He is firm area behind left nipple. Obtain ultrasound and mammogram.

## 2013-10-31 NOTE — Progress Notes (Signed)
Pre visit review using our clinic review tool, if applicable. No additional management support is needed unless otherwise documented below in the visit note. 

## 2013-10-31 NOTE — Assessment & Plan Note (Signed)
Check total and free testosterone levels.

## 2013-11-01 LAB — TESTOSTERONE, FREE, TOTAL, SHBG
Sex Hormone Binding: 15 nmol/L (ref 13–71)
TESTOSTERONE: 186 ng/dL — AB (ref 300–890)
Testosterone, Free: 51.4 pg/mL (ref 47.0–244.0)
Testosterone-% Free: 2.8 % (ref 1.6–2.9)

## 2013-11-01 LAB — PSA: PSA: 0.36 ng/mL (ref <=4.00)

## 2013-11-06 ENCOUNTER — Other Ambulatory Visit: Payer: Self-pay | Admitting: *Deleted

## 2013-11-06 ENCOUNTER — Ambulatory Visit: Payer: 59 | Admitting: Family Medicine

## 2013-11-06 MED ORDER — ATORVASTATIN CALCIUM 20 MG PO TABS: 20.0000 mg | ORAL_TABLET | Freq: Every day | ORAL | Status: DC

## 2013-11-11 ENCOUNTER — Emergency Department (HOSPITAL_COMMUNITY)
Admission: EM | Admit: 2013-11-11 | Discharge: 2013-11-11 | Disposition: A | Discharge status: Home / Self Care | Payer: 59 | Attending: Emergency Medicine | Admitting: Emergency Medicine

## 2013-11-11 ENCOUNTER — Encounter (HOSPITAL_COMMUNITY): Payer: Self-pay | Admitting: Emergency Medicine

## 2013-11-11 DIAGNOSIS — Z7982 Long term (current) use of aspirin: Secondary | ICD-10-CM | POA: Diagnosis not present

## 2013-11-11 DIAGNOSIS — E876 Hypokalemia: Secondary | ICD-10-CM | POA: Insufficient documentation

## 2013-11-11 DIAGNOSIS — I5020 Unspecified systolic (congestive) heart failure: Secondary | ICD-10-CM | POA: Insufficient documentation

## 2013-11-11 DIAGNOSIS — S8990XA Unspecified injury of unspecified lower leg, initial encounter: Secondary | ICD-10-CM | POA: Diagnosis not present

## 2013-11-11 DIAGNOSIS — IMO0002 Reserved for concepts with insufficient information to code with codable children: Secondary | ICD-10-CM | POA: Diagnosis present

## 2013-11-11 DIAGNOSIS — E669 Obesity, unspecified: Secondary | ICD-10-CM | POA: Diagnosis not present

## 2013-11-11 DIAGNOSIS — Z8669 Personal history of other diseases of the nervous system and sense organs: Secondary | ICD-10-CM | POA: Insufficient documentation

## 2013-11-11 DIAGNOSIS — Y9389 Activity, other specified: Secondary | ICD-10-CM | POA: Diagnosis not present

## 2013-11-11 DIAGNOSIS — I10 Essential (primary) hypertension: Secondary | ICD-10-CM | POA: Diagnosis not present

## 2013-11-11 DIAGNOSIS — S335XXA Sprain of ligaments of lumbar spine, initial encounter: Secondary | ICD-10-CM | POA: Diagnosis not present

## 2013-11-11 DIAGNOSIS — S99929A Unspecified injury of unspecified foot, initial encounter: Secondary | ICD-10-CM

## 2013-11-11 DIAGNOSIS — Z9889 Other specified postprocedural states: Secondary | ICD-10-CM | POA: Diagnosis not present

## 2013-11-11 DIAGNOSIS — S336XXA Sprain of sacroiliac joint, initial encounter: Secondary | ICD-10-CM | POA: Diagnosis not present

## 2013-11-11 DIAGNOSIS — Y92410 Unspecified street and highway as the place of occurrence of the external cause: Secondary | ICD-10-CM | POA: Diagnosis not present

## 2013-11-11 DIAGNOSIS — S99919A Unspecified injury of unspecified ankle, initial encounter: Secondary | ICD-10-CM | POA: Insufficient documentation

## 2013-11-11 DIAGNOSIS — Z79899 Other long term (current) drug therapy: Secondary | ICD-10-CM | POA: Insufficient documentation

## 2013-11-11 DIAGNOSIS — T148XXA Other injury of unspecified body region, initial encounter: Secondary | ICD-10-CM

## 2013-11-11 MED ORDER — IBUPROFEN 800 MG PO TABS: 800.0000 mg | ORAL_TABLET | Freq: Three times a day (TID) | ORAL | Status: DC

## 2013-11-11 MED ORDER — HYDROCODONE-ACETAMINOPHEN 5-325 MG PO TABS: 1.0000 | ORAL_TABLET | Freq: Four times a day (QID) | ORAL | Status: DC | PRN

## 2013-11-11 MED ORDER — CYCLOBENZAPRINE HCL 10 MG PO TABS: 10.0000 mg | ORAL_TABLET | Freq: Two times a day (BID) | ORAL | Status: DC | PRN
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
## 2013-11-11 NOTE — Discharge Instructions (Signed)
Muscle Strain °A muscle strain is an injury that occurs when a muscle is stretched beyond its normal length. Usually a small number of muscle fibers are torn when this happens. Muscle strain is rated in degrees. First-degree strains have the least amount of muscle fiber tearing and pain. Second-degree and third-degree strains have increasingly more tearing and pain.  °Usually, recovery from muscle strain takes 1-2 weeks. Complete healing takes 5-6 weeks.  °CAUSES  °Muscle strain happens when a sudden, violent force placed on a muscle stretches it too far. This may occur with lifting, sports, or a fall.  °RISK FACTORS °Muscle strain is especially common in athletes.  °SIGNS AND SYMPTOMS °At the site of the muscle strain, there may be: °· Pain. °· Bruising. °· Swelling. °· Difficulty using the muscle due to pain or lack of normal function. °DIAGNOSIS  °Your health care provider will perform a physical exam and ask about your medical history. °TREATMENT  °Often, the best treatment for a muscle strain is resting, icing, and applying cold compresses to the injured area.   °HOME CARE INSTRUCTIONS  °· Use the PRICE method of treatment to promote muscle healing during the first 2-3 days after your injury. The PRICE method involves: °¨ Protecting the muscle from being injured again. °¨ Restricting your activity and resting the injured body part. °¨ Icing your injury. To do this, put ice in a plastic bag. Place a towel between your skin and the bag. Then, apply the ice and leave it on from 15-20 minutes each hour. After the third day, switch to moist heat packs. °¨ Apply compression to the injured area with a splint or elastic bandage. Be careful not to wrap it too tightly. This may interfere with blood circulation or increase swelling. °¨ Elevate the injured body part above the level of your heart as often as you can. °· Only take over-the-counter or prescription medicines for pain, discomfort, or fever as directed by your  health care provider. °· Warming up prior to exercise helps to prevent future muscle strains. °SEEK MEDICAL CARE IF:  °· You have increasing pain or swelling in the injured area. °· You have numbness, tingling, or a significant loss of strength in the injured area. °MAKE SURE YOU:  °· Understand these instructions. °· Will watch your condition. °· Will get help right away if you are not doing well or get worse. °Document Released: 03/22/2005 Document Revised: 01/10/2013 Document Reviewed: 10/19/2012 °[employer]® Patient Information ©2015 [employer]. This information is not intended to replace advice given to you by your health care provider. Make sure you discuss any questions you have with your health care provider. °Cryotherapy °Cryotherapy means treatment with cold. Ice or gel packs can be used to reduce both pain and swelling. Ice is the most helpful within the first 24 to 48 hours after an injury or flare-up from overusing a muscle or joint. Sprains, strains, spasms, burning pain, shooting pain, and aches can all be eased with ice. Ice can also be used when recovering from surgery. Ice is effective, has very few side effects, and is safe for most people to use. °PRECAUTIONS  °Ice is not a safe treatment option for people with: °· Raynaud phenomenon. This is a condition affecting small blood vessels in the extremities. Exposure to cold may cause your problems to return. °· Cold hypersensitivity. There are many forms of cold hypersensitivity, including: °· Cold urticaria. Red, itchy hives appear on the skin when the tissues begin to warm after being   iced. °· Cold erythema. This is a red, itchy rash caused by exposure to cold. °· Cold hemoglobinuria. Red blood cells break down when the tissues begin to warm after being iced. The hemoglobin that carry oxygen are passed into the urine because they cannot combine with blood proteins fast enough. °· Numbness or altered sensitivity in the area being iced. °If you have  any of the following conditions, do not use ice until you have discussed cryotherapy with your caregiver: °· Heart conditions, such as arrhythmia, angina, or chronic heart disease. °· High blood pressure. °· Healing wounds or open skin in the area being iced. °· Current infections. °· Rheumatoid arthritis. °· Poor circulation. °· Diabetes. °Ice slows the blood flow in the region it is applied. This is beneficial when trying to stop inflamed tissues from spreading irritating chemicals to surrounding tissues. However, if you expose your skin to cold temperatures for too long or without the proper protection, you can damage your skin or nerves. Watch for signs of skin damage due to cold. °HOME CARE INSTRUCTIONS °Follow these tips to use ice and cold packs safely. °· Place a dry or damp towel between the ice and skin. A damp towel will cool the skin more quickly, so you may need to shorten the time that the ice is used. °· For a more rapid response, add gentle compression to the ice. °· Ice for no more than 10 to 20 minutes at a time. The bonier the area you are icing, the less time it will take to get the benefits of ice. °· Check your skin after 5 minutes to make sure there are no signs of a poor response to cold or skin damage. °· Rest 20 minutes or more between uses. °· Once your skin is numb, you can end your treatment. You can test numbness by very lightly touching your skin. The touch should be so light that you do not see the skin dimple from the pressure of your fingertip. When using ice, most people will feel these normal sensations in this order: cold, burning, aching, and numbness. °· Do not use ice on someone who cannot communicate their responses to pain, such as small children or people with dementia. °HOW TO MAKE AN ICE PACK °Ice packs are the most common way to use ice therapy. Other methods include ice massage, ice baths, and cryosprays. Muscle creams that cause a cold, tingly feeling do not offer the  same benefits that ice offers and should not be used as a substitute unless recommended by your caregiver. °To make an ice pack, do one of the following: °· Place crushed ice or a bag of frozen vegetables in a sealable plastic bag. Squeeze out the excess air. Place this bag inside another plastic bag. Slide the bag into a pillowcase or place a damp towel between your skin and the bag. °· Mix 3 parts water with 1 part rubbing alcohol. Freeze the mixture in a sealable plastic bag. When you remove the mixture from the freezer, it will be slushy. Squeeze out the excess air. Place this bag inside another plastic bag. Slide the bag into a pillowcase or place a damp towel between your skin and the bag. °SEEK MEDICAL CARE IF: °· You develop white spots on your skin. This may give the skin a blotchy (mottled) appearance. °· Your skin turns blue or pale. °· Your skin becomes waxy or hard. °· Your swelling gets worse. °MAKE SURE YOU:  °· Understand these instructions. °·   Will watch your condition. °· Will get help right away if you are not doing well or get worse. °Document Released: 11/16/2010 Document Revised: 08/06/2013 Document Reviewed: 11/16/2010 °[employer]® Patient Information ©2015 [employer]. This information is not intended to replace advice given to you by your health care provider. Make sure you discuss any questions you have with your health care provider. ° °
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
## 2013-11-13 ENCOUNTER — Other Ambulatory Visit (INDEPENDENT_AMBULATORY_CARE_PROVIDER_SITE_OTHER): Payer: 59

## 2013-11-13 ENCOUNTER — Encounter: Payer: Self-pay | Admitting: Family Medicine

## 2013-11-13 ENCOUNTER — Ambulatory Visit (INDEPENDENT_AMBULATORY_CARE_PROVIDER_SITE_OTHER): Payer: 59 | Admitting: Family Medicine

## 2013-11-13 VITALS — BP 134/89 | HR 80 | Ht 69.5 in | Wt 259.0 lb

## 2013-11-13 DIAGNOSIS — M25519 Pain in unspecified shoulder: Secondary | ICD-10-CM

## 2013-11-13 DIAGNOSIS — M25512 Pain in left shoulder: Secondary | ICD-10-CM

## 2013-11-13 DIAGNOSIS — M7551 Bursitis of right shoulder: Secondary | ICD-10-CM

## 2013-11-13 DIAGNOSIS — M25511 Pain in right shoulder: Secondary | ICD-10-CM

## 2013-11-13 DIAGNOSIS — M7500 Adhesive capsulitis of unspecified shoulder: Secondary | ICD-10-CM

## 2013-11-13 DIAGNOSIS — M67919 Unspecified disorder of synovium and tendon, unspecified shoulder: Secondary | ICD-10-CM

## 2013-11-13 DIAGNOSIS — M719 Bursopathy, unspecified: Secondary | ICD-10-CM

## 2013-11-13 MED ORDER — MELOXICAM 15 MG PO TABS: 15.0000 mg | ORAL_TABLET | Freq: Every day | ORAL | Status: DC

## 2013-11-13 NOTE — Patient Instructions (Addendum)
Nice to meet you Ice 20 minutes 2 times daily. Usually after activity and before bed. Exercises 3 times a week.  Meloxicam daily for 10 days.  Watch your posture when you can.   Need to avoid simple carbs (breads, sweets,)  Instead focus on vegetables and protein) Follow up with primary care for your sugars.  Come back again in 3 weeks to make sure you are improving.

## 2013-11-13 NOTE — Progress Notes (Signed)
[facility] [address] [place], [place] [address] Phone: [phone] Subjective:    I'm seeing this patient by the request  of:  Thomos Lemons, DO   CC: Bilateral shoulder pain.   C[facility] Garcia is a 48 y.o. male coming in with complaint of shoulder pain. Patient states that it is will to the shoulders. Patient has a minimal increased pain in the right side compared to the left side. Patient was seen for this problem 4 months ago in urgent care and was given an injection. Patient states that that may no significant difference whatsoever. Patient states that his primary care provider for her recent visit and was told to come here for further evaluation. Patient states that he has a dull aching sensation at all times in both shoulders that makes it almost impossible for him to sleep at night. Discussed icing previously he states that did not give him any improvement. Patient has tried ibuprofen and a couple other anti-inflammatories with no significant improvement. Patient denies though any neck pain that is related to this or any radiation down the arms. States he does not feel weak but feels that his arms are heavy. States that the severity of the pain as 5/10. Does wake him up at night     Past medical history, social, surgical and family history all reviewed in electronic medical record.   Review of Systems: No headache, visual changes, nausea, vomiting, diarrhea, constipation, dizziness, abdominal pain, skin rash, fevers, chills, night sweats, weight loss, swollen lymph nodes, body aches, joint swelling, muscle aches, chest pain, shortness of breath, mood changes.   Objective There were no vitals taken for this visit.  General: No apparent distress alert and oriented x3 mood and affect normal, dressed appropriately.  HEENT: Pupils equal, extraocular movements intact  Respiratory: Patient's speak in full sentences and does not appear short of  breath  Cardiovascular: No lower extremity edema, non tender, no erythema  Skin: Warm dry intact with no signs of infection or rash on extremities or on axial skeleton.  Abdomen: Soft nontender  Neuro: Cranial nerves II through XII are intact, neurovascularly intact in all extremities with 2+ DTRs and 2+ pulses.  Lymph: No lymphadenopathy of posterior or anterior cervical chain or axillae bilaterally.  Gait normal with good balance and coordination.  MSK:  Non tender with full range of motion and good stability and symmetric strength and tone of  elbows, wrist, hip, knee and ankles bilaterally.  Shoulder: Bilateral Inspection reveals no abnormalities, atrophy or asymmetry. Palpation is normal with no tenderness over AC joint or bicipital groove. ROM is full in all planes passively. Rotator cuff strength 4/5 but symmetric signs of impingement with positive Neer and Hawkin's tests, but negative empty can sign. Speeds and Yergason's tests normal. No labral pathology noted with negative Obrien's, negative clunk and good stability. Normal scapular function observed. No painful arc and no drop arm sign. No apprehension sign  MSK US performed of: Right This study was ordered, performed, and interpreted by Terrilee Files D.O.  Shoulder:   Supraspinatus:  Appears normal on long and transverse views, Bursal bulge seen with shoulder abduction on impingement view. Infraspinatus:  Appears normal on long and transverse views. Significant increase in Doppler flow Subscapularis:  Appears normal on long and transverse views. Positive bursa Teres Minor:  Appears normal on long and transverse views. AC joint:  Capsule undistended, no geyser sign. Glenohumeral Joint:  Appears normal without effusion. Patient  does have thickening of the capsule Glenoid Labrum:  Intact without visualized tears. Biceps Tendon:  Appears normal on long and transverse views, no fraying of tendon, tendon located in intertubercular  groove, no subluxation with shoulder internal or external rotation.  Impression: Subacromial bursitis thickening of the capsule  Procedure: Real-time Ultrasound Guided Injection of right glenohumeral joint Device: GE Logiq E  Ultrasound guided injection is preferred based studies that show increased duration, increased effect, greater accuracy, decreased procedural pain, increased response rate with ultrasound guided versus blind injection.  Verbal informed consent obtained.  Time-out conducted.  Noted no overlying erythema, induration, or other signs of local infection.  Skin prepped in a sterile fashion.  Local anesthesia: Topical Ethyl chloride.  With sterile technique and under real time ultrasound guidance:  Joint visualized.  23g 1  inch needle inserted posterior approach. Pictures taken for needle placement. Patient did have injection of 2 cc of 1% lidocaine, 2 cc of 0.5% Marcaine, and 1.0 cc of Kenalog 40 mg/dL. Completed without difficulty  Pain immediately resolved suggesting accurate placement of the medication.  Advised to call if fevers/chills, erythema, induration, drainage, or persistent bleeding.  Images permanently stored and available for review in the ultrasound unit.  Impression: Technically successful ultrasound guided injection.  Procedure: Real-time Ultrasound Guided Injection of left glenohumeral joint Device: GE Logiq E  Ultrasound guided injection is preferred based studies that show increased duration, increased effect, greater accuracy, decreased procedural pain, increased response rate with ultrasound guided versus blind injection.  Verbal informed consent obtained.  Time-out conducted.  Noted no overlying erythema, induration, or other signs of local infection.  Skin prepped in a sterile fashion.  Local anesthesia: Topical Ethyl chloride.  With sterile technique and under real time ultrasound guidance:  Joint visualized.  23g 1  inch needle inserted  posterior approach. Pictures taken for needle placement. Patient did have injection of 2 cc of 1% lidocaine, 2 cc of 0.5% Marcaine, and 1cc of Kenalog 40 mg/dL. Completed without difficulty  Pain immediately resolved suggesting accurate placement of the medication.  Advised to call if fevers/chills, erythema, induration, drainage, or persistent bleeding.  Images permanently stored and available for review in the ultrasound unit.  Impression: Technically successful ultrasound guided injection.    Impression and Recommendations:     This case required medical decision making of moderate complexity.

## 2013-11-13 NOTE — Assessment & Plan Note (Signed)
Discussed with patient at great length. Patient's labs to appear the patient may have increasing blood sugars recently. Patient states that he has had difficulty with gaining weight too.  Patient was given injections in today for what I would think is possibly early adhesive capsulitis. This is from findings on ultrasound today with him not having any decrease in range of motion at the moment. Patient did have near complete resolution of pain and did have some popping that could have been the breaking of the adhesives. We discussed home exercises and icing. We discussed signs and symptoms of hyperglycemia and went to seek medical attention. We discussed icing and prescription for meloxicam was given and told not to take with any other anti-inflammatories. Patient will try these interventions and come back and see me again in 3-4 weeks.

## 2013-11-13 NOTE — ED Provider Notes (Signed)
Medical screening examination/treatment/procedure(s) were performed by non-physician practitioner and as supervising physician I was immediately available for consultation/collaboration.  Toy Cookey, MD 11/13/13 1235

## 2013-11-14 ENCOUNTER — Ambulatory Visit
Admission: RE | Admit: 2013-11-14 | Discharge: 2013-11-14 | Disposition: A | Discharge status: Home / Self Care | Payer: 59 | Source: Ambulatory Visit | Attending: Internal Medicine | Admitting: Internal Medicine

## 2013-11-14 DIAGNOSIS — N644 Mastodynia: Secondary | ICD-10-CM

## 2013-11-20 ENCOUNTER — Other Ambulatory Visit: Payer: Self-pay | Admitting: *Deleted

## 2013-11-20 DIAGNOSIS — I5022 Chronic systolic (congestive) heart failure: Secondary | ICD-10-CM

## 2013-11-20 MED ORDER — SPIRONOLACTONE 25 MG PO TABS: 50.0000 mg | ORAL_TABLET | Freq: Every day | ORAL | Status: DC

## 2013-11-20 MED ORDER — POTASSIUM CHLORIDE CRYS ER 10 MEQ PO TBCR
EXTENDED_RELEASE_TABLET | ORAL | Status: DC
Start: 2013-11-20 — End: 2014-10-05

## 2013-11-20 MED ORDER — FUROSEMIDE 40 MG PO TABS: ORAL_TABLET | ORAL | Status: DC

## 2013-11-20 MED ORDER — FELODIPINE ER 2.5 MG PO TB24: 2.5000 mg | ORAL_TABLET | Freq: Every day | ORAL | Status: DC

## 2013-11-26 ENCOUNTER — Encounter: Payer: Self-pay | Admitting: Internal Medicine

## 2013-11-26 ENCOUNTER — Ambulatory Visit (INDEPENDENT_AMBULATORY_CARE_PROVIDER_SITE_OTHER): Payer: 59 | Admitting: Internal Medicine

## 2013-11-26 VITALS — BP 124/76 | HR 76 | Temp 98.2°F | Ht 69.5 in | Wt 255.0 lb

## 2013-11-26 DIAGNOSIS — M25512 Pain in left shoulder: Secondary | ICD-10-CM

## 2013-11-26 DIAGNOSIS — Z23 Encounter for immunization: Secondary | ICD-10-CM

## 2013-11-26 DIAGNOSIS — E1165 Type 2 diabetes mellitus with hyperglycemia: Secondary | ICD-10-CM

## 2013-11-26 DIAGNOSIS — N644 Mastodynia: Secondary | ICD-10-CM

## 2013-11-26 DIAGNOSIS — M25519 Pain in unspecified shoulder: Secondary | ICD-10-CM

## 2013-11-26 DIAGNOSIS — IMO0001 Reserved for inherently not codable concepts without codable children: Secondary | ICD-10-CM

## 2013-11-26 DIAGNOSIS — M25511 Pain in right shoulder: Secondary | ICD-10-CM

## 2013-11-26 DIAGNOSIS — IMO0002 Reserved for concepts with insufficient information to code with codable children: Secondary | ICD-10-CM

## 2013-11-26 DIAGNOSIS — E119 Type 2 diabetes mellitus without complications: Secondary | ICD-10-CM | POA: Insufficient documentation

## 2013-11-26 DIAGNOSIS — E785 Hyperlipidemia, unspecified: Secondary | ICD-10-CM

## 2013-11-26 MED ORDER — ATORVASTATIN CALCIUM 20 MG PO TABS: 20.0000 mg | ORAL_TABLET | Freq: Every day | ORAL | Status: DC

## 2013-11-26 MED ORDER — METFORMIN HCL ER (MOD) 500 MG PO TB24: 500.0000 mg | ORAL_TABLET | Freq: Every day | ORAL | Status: DC

## 2013-11-26 MED ORDER — METFORMIN HCL 500 MG PO TABS: 500.0000 mg | ORAL_TABLET | Freq: Two times a day (BID) | ORAL | Status: DC

## 2013-11-26 NOTE — Assessment & Plan Note (Signed)
Switch from simvastatin to atorvastatin.  Arrange follow up FLP and LFTs.

## 2013-11-26 NOTE — Assessment & Plan Note (Signed)
Mammogram was negative for malignancy. His breast tenderness likely secondary to side effect from aldactone. Patient will discuss possible change in medication to eplerenone with his cardiologist.

## 2013-11-26 NOTE — Patient Instructions (Signed)
Please complete the following lab tests before your next follow up appointment: BMET, A1c, microalb /cr ratio - 250.02 FLP, LFTs, CK - 272.4

## 2013-11-26 NOTE — Assessment & Plan Note (Signed)
Patient's sedimentation rate was normal. Patient received cortisone injection for possible adhesive capsulitis. Continue range of motion exercises.  I doubt his symptoms related to use of statin medication.

## 2013-11-26 NOTE — Assessment & Plan Note (Addendum)
48 year old [place] American male with new onset type 2 diabetes. His A1c is 7.2. Refer to diabetic educator. Start metformin 500 mg twice daily. We discussed reducing his dose to 250 mg twice daily with a meal she experience gastrointestinal side effects.  Patient updated with Pneumovax and influenza vaccine.  Refer to ophthalmologist for diabetic eye exam.

## 2013-11-26 NOTE — Progress Notes (Signed)
Pre visit review using our clinic review tool, if applicable. No additional management support is needed unless otherwise documented below in the visit note. 
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
## 2013-12-04 ENCOUNTER — Ambulatory Visit: Payer: 59 | Admitting: Internal Medicine

## 2013-12-05 ENCOUNTER — Ambulatory Visit: Payer: 59 | Admitting: Family Medicine

## 2013-12-05 DIAGNOSIS — Z0289 Encounter for other administrative examinations: Secondary | ICD-10-CM

## 2013-12-06 ENCOUNTER — Telehealth: Payer: Self-pay | Admitting: Family Medicine

## 2013-12-06 NOTE — Telephone Encounter (Signed)
error 

## 2013-12-06 NOTE — Telephone Encounter (Signed)
Patient no showed for 3 week fu on 9/2.  Please advise.

## 2013-12-07 NOTE — Telephone Encounter (Signed)
Noted  

## 2013-12-12 LAB — HM DIABETES EYE EXAM

## 2013-12-13 ENCOUNTER — Encounter: Payer: Self-pay | Admitting: Internal Medicine

## 2013-12-18 ENCOUNTER — Ambulatory Visit: Payer: 59 | Admitting: Internal Medicine

## 2013-12-20 ENCOUNTER — Encounter: Payer: 59 | Attending: Internal Medicine

## 2013-12-20 VITALS — Ht 70.0 in | Wt 246.0 lb

## 2013-12-20 DIAGNOSIS — Z713 Dietary counseling and surveillance: Secondary | ICD-10-CM | POA: Insufficient documentation

## 2013-12-20 DIAGNOSIS — E119 Type 2 diabetes mellitus without complications: Secondary | ICD-10-CM

## 2013-12-21 NOTE — Progress Notes (Signed)

## 2013-12-27 DIAGNOSIS — E119 Type 2 diabetes mellitus without complications: Secondary | ICD-10-CM

## 2013-12-28 NOTE — Progress Notes (Signed)

## 2014-01-03 ENCOUNTER — Encounter: Payer: 59 | Attending: Internal Medicine

## 2014-01-10 ENCOUNTER — Encounter (HOSPITAL_COMMUNITY): Payer: 59

## 2014-01-24 ENCOUNTER — Encounter (HOSPITAL_COMMUNITY): Payer: 59

## 2014-01-28 ENCOUNTER — Ambulatory Visit: Payer: 59 | Admitting: Internal Medicine

## 2014-01-28 DIAGNOSIS — Z0289 Encounter for other administrative examinations: Secondary | ICD-10-CM

## 2014-02-01 ENCOUNTER — Ambulatory Visit (INDEPENDENT_AMBULATORY_CARE_PROVIDER_SITE_OTHER): Payer: 59 | Admitting: Family Medicine

## 2014-02-01 VITALS — BP 120/80 | HR 80 | Temp 98.1°F | Resp 18 | Ht 70.5 in | Wt 248.6 lb

## 2014-02-01 DIAGNOSIS — H9203 Otalgia, bilateral: Secondary | ICD-10-CM

## 2014-02-01 DIAGNOSIS — H6983 Other specified disorders of Eustachian tube, bilateral: Secondary | ICD-10-CM

## 2014-02-01 DIAGNOSIS — R51 Headache: Secondary | ICD-10-CM

## 2014-02-01 DIAGNOSIS — H6993 Unspecified Eustachian tube disorder, bilateral: Secondary | ICD-10-CM

## 2014-02-01 DIAGNOSIS — R519 Headache, unspecified: Secondary | ICD-10-CM

## 2014-02-01 DIAGNOSIS — H6503 Acute serous otitis media, bilateral: Secondary | ICD-10-CM

## 2014-02-01 DIAGNOSIS — R509 Fever, unspecified: Secondary | ICD-10-CM

## 2014-02-01 LAB — POCT CBC
GRANULOCYTE PERCENT: 54.1 % (ref 37–80)
HCT, POC: 43.2 % — AB (ref 43.5–53.7)
Hemoglobin: 13.5 g/dL — AB (ref 14.1–18.1)
Lymph, poc: 2.6 (ref 0.6–3.4)
MCH, POC: 28.8 pg (ref 27–31.2)
MCHC: 31.4 g/dL — AB (ref 31.8–35.4)
MCV: 91.8 fL (ref 80–97)
MID (CBC): 0.3 (ref 0–0.9)
MPV: 9.3 fL (ref 0–99.8)
POC Granulocyte: 3.4 (ref 2–6.9)
POC LYMPH PERCENT: 41 %L (ref 10–50)
POC MID %: 4.9 %M (ref 0–12)
Platelet Count, POC: 245 10*3/uL (ref 142–424)
RBC: 4.7 M/uL (ref 4.69–6.13)
RDW, POC: 14.6 %
WBC: 6.3 10*3/uL (ref 4.6–10.2)

## 2014-02-01 MED ORDER — AMOXICILLIN-POT CLAVULANATE 875-125 MG PO TABS: 1.0000 | ORAL_TABLET | Freq: Two times a day (BID) | ORAL | Status: DC

## 2014-02-01 MED ORDER — FLUTICASONE PROPIONATE 50 MCG/ACT NA SUSP: 2.0000 | Freq: Every day | NASAL | Status: DC

## 2014-02-01 NOTE — Progress Notes (Addendum)
Subjective:    Patient ID: Caleb Garcia, male    DOB: 31-Dec-1965, 48 y.o.   MRN: [id number]  This chart was scribed for [name], MD by Tonye Royalty, ED Scribe. This patient was seen in room 1 and the patient's care was started at 9:48 AM.   Chief Complaint  Patient presents with  . Cerumen Impaction    x1 week; pt states both of his ears feeling impacted;   . Fever    off/on  . Headache    pt states he is having head pain from possbile ear impaction    HPI  HPI Comments: Caleb Garcia is a 48 y.o. male who presents to the [facility] complaining of constant, bilateral cerumen impaction with onset 1 week ago. He states they both began to feel blocked at once. He states he felt nauseated and vomited twice last week, but has not had that problem since. He denies congestion, rhinorrhea, or sinus pressure. He does state his nose is dry and he has been having intermittent fever with associated diaphoresis. He denies chest pain, chest tightness, or difficulty breathing associated with his fever/diaphoresis. He states the last episode was yesterday. He reports prior sinus problems with drainage. He states he been using Tylenol only. He states his grandchild has been sick with cold-like symptoms and wheezing. He states he often suffers from seasonal allergies. At end of visit - did admit to using claritin once per day for past week - no change in symptoms.   Patient Active Problem List   Diagnosis Date Noted  . Type II or unspecified type diabetes mellitus without mention of complication, uncontrolled 11/26/2013  . Bilateral shoulder pain 10/31/2013  . Breast tenderness in male 10/31/2013  . Erectile dysfunction 04/18/2013  . HYPERLIPIDEMIA-MIXED 11/26/2009  . LIMB PAIN 07/23/2009  . GERD 04/29/2009  . CVA 10/23/2008  . HYPOKALEMIA 10/22/2008  . OBESITY 10/22/2008  . OBSTRUCTIVE SLEEP APNEA 10/22/2008  . HYPERTENSION 10/22/2008  . CARDIOMYOPATHY 10/22/2008   . CONGESTIVE HEART FAILURE, MILD 10/22/2008  . Chronic systolic heart failure 10/16/2008   Past Medical History  Diagnosis Date  . Hypokalemia   . Systolic CHF   . Glucose intolerance (impaired glucose tolerance)   . Cardiomyopathy     Non-ischemic  . Obesity   . HTN (hypertension)   . OSA (obstructive sleep apnea)   . CHF (congestive heart failure)   . Pre-diabetes   . Diabetes mellitus without complication   . Hyperlipidemia    Past Surgical History  Procedure Laterality Date  . Cardiac catheterization  10/08/08  . Tonsillectomy    . Uvulopalatopharyngoplasty     Allergies  Allergen Reactions  . Shrimp [Shellfish Allergy]    Prior to Admission medications   Medication Sig Start Date End Date Taking? Authorizing Provider  aspirin 81 MG tablet Take 81 mg by mouth daily.     Yes Historical Provider, MD  atorvastatin (LIPITOR) 20 MG tablet Take 1 tablet (20 mg total) by mouth daily. 11/26/13  Yes Doe-Hyun Sherran Needs, DO  carvedilol (COREG) 25 MG tablet Take 1 tablet (25 mg total) by mouth 2 (two) times daily with a meal. 04/18/13  Yes Aundria Rud, NP  felodipine (PLENDIL) 2.5 MG 24 hr tablet Take 1 tablet (2.5 mg total) by mouth daily. 11/20/13  Yes Doe-Hyun R Artist Pais, DO  furosemide (LASIX) 40 MG tablet TAKE ONE TABLET BY MOUTH ONCE DAILY 11/20/13  Yes Doe-Hyun Sherran Needs, DO  hydrALAZINE (APRESOLINE) 25 MG tablet Take 1 tablet (25 mg total) by mouth 3 (three) times daily. 04/18/13  Yes Aundria Rud, NP  lisinopril (PRINIVIL,ZESTRIL) 40 MG tablet Take 1 tablet (40 mg total) by mouth daily. 04/18/13  Yes Aundria Rud, NP  meloxicam (MOBIC) 15 MG tablet Take 1 tablet (15 mg total) by mouth daily. 11/13/13  Yes Judi Saa, DO  metFORMIN (GLUCOPHAGE) 500 MG tablet Take 1 tablet (500 mg total) by mouth 2 (two) times daily with a meal. 11/26/13  Yes Doe-Hyun R Artist Pais, DO  potassium chloride (KLOR-CON M10) 10 MEQ tablet TAKE ONE TABLET BY MOUTH EVERY DAY 11/20/13  Yes Doe-Hyun Sherran Needs, DO    spironolactone (ALDACTONE) 25 MG tablet Take 2 tablets (50 mg total) by mouth daily. 11/20/13  Yes Doe-Hyun Sherran Needs, DO   History   Social History  . Marital Status: Married    Spouse Name: N/A    Number of Children: N/A  . Years of Education: N/A   Occupational History  .  Unemployed   Social History Main Topics  . Smoking status: Never Smoker   . Smokeless tobacco: Not on file  . Alcohol Use: No  . Drug Use: No  . Sexual Activity: Not on file   Other Topics Concern  . 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  HENT: Negative for congestion, rhinorrhea, sinus pressure and sneezing.        Cerumen impaction, nostrils feel dry  Respiratory: Negative for chest tightness and shortness of breath.   Cardiovascular: Negative for chest pain.  Gastrointestinal: Negative for nausea and vomiting.  Allergic/Immunologic: Positive for environmental allergies.       Objective:   Physical Exam  Nursing note and vitals reviewed. Constitutional: He is oriented to person, place, and time. He appears well-developed and well-nourished.  HENT:  Head: Normocephalic and atraumatic.  Right Ear: Tympanic membrane, external ear and ear canal normal.  Left Ear: Tympanic membrane, external ear and ear canal normal.  Nose: No rhinorrhea.  Mouth/Throat: Oropharynx is clear and moist and mucous membranes are normal. No oropharyngeal exudate or posterior oropharyngeal erythema.  Absence of uvula. minimal clear discharge. slight edema of the turbinates of the nose. Behind TM there is clear fluid. TM was not red, retracted or bulging. Canals were clear. Pinna was nontender. Sinuses nontender.   Eyes: Conjunctivae are normal. Pupils are equal, round, and reactive to light. Right eye exhibits no  nystagmus. Left eye exhibits no nystagmus.  Neck: Neck supple.  Cardiovascular: Normal rate, regular rhythm, normal heart sounds and intact distal pulses.   No murmur heard. Pulmonary/Chest: Effort normal and breath sounds normal. He has no wheezes. He has no rhonchi. He has no rales.  Abdominal: Soft. There is no tenderness.  Lymphadenopathy:    He has no cervical adenopathy.  Neurological: He is alert and oriented to person, place, and time. No cranial nerve deficit. Coordination and gait normal.  Nonfocal, normal heel to toe, no pronator drift.   Skin: Skin is warm and dry. No rash noted.  Psychiatric: He has a normal mood and affect. His behavior is normal.    Filed Vitals:   02/01/14 0902  BP: 120/80  Pulse: 80  Temp:  98.1 F (36.7 C)  TempSrc: Oral  Resp: 18  Height: 5' 10.5" (1.791 m)  Weight: 248 lb 9.6 oz (112.764 kg)  SpO2: 98%   Results for orders placed in visit on 02/01/14  POCT CBC      Result Value Ref Range   WBC 6.3  4.6 - 10.2 K/uL   Lymph, poc 2.6  0.6 - 3.4   POC LYMPH PERCENT 41.0  10 - 50 %L   MID (cbc) 0.3  0 - 0.9   POC MID % 4.9  0 - 12 %M   POC Granulocyte 3.4  2 - 6.9   Granulocyte percent 54.1  37 - 80 %G   RBC 4.70  4.69 - 6.13 M/uL   Hemoglobin 13.5 (*) 14.1 - 18.1 g/dL   HCT, POC 09.8 (*) 11.9 - 53.7 %   MCV 91.8  80 - 97 fL   MCH, POC 28.8  27 - 31.2 pg   MCHC 31.4 (*) 31.8 - 35.4 g/dL   RDW, POC 14.7     Platelet Count, POC 245  142 - 424 K/uL   MPV 9.3  0 - 99.8 fL       Assessment & Plan:   JOVANTE HAMMITT is a 48 y.o. male Otalgia, bilateral, ETD (eustachian tube dysfunction), bilateral - Plan: fluticasone (FLONASE) 50 MCG/ACT nasal spray, Bilateral acute serous otitis media, recurrence not specified - Plan: fluticasone (FLONASE) 50 MCG/ACT nasal spray, amoxicillin-clavulanate (AUGMENTIN) 875-125 MG per tablet  -suspected ETD with serous otitis. Canals clear. No sign of rupture, and clear effusion - less likely infected at this  point. With headache - sinusitis also possible.  Allergic or URI possible initial causes.   -Sx care with saline ns, trial of flonase, continue claritin, then if not improving in few days - Augmentin rx printed to fill.   -RTC precautions discussed, understanding expressed.   Fever and chills - Plan: POCT CBC.   -subjective, and afebrile in office with normal WBC on CBC. Borderline HGB, but can be rechecked with PCP. If measures a fever, can start Augmentin as above, but rtc if fever persists beyond 2 days or other symptom worsening.   Nonintractable headache, unspecified chronicity pattern, unspecified headache type - Plan: amoxicillin-clavulanate (AUGMENTIN) 875-125 MG per tablet  -secondary to #1 likely, nonfocal exam. Sx care with tylenol and as above, with RTC/ER precautions.    Meds ordered this encounter  Medications  . fluticasone (FLONASE) 50 MCG/ACT nasal spray    Sig: Place 2 sprays into both nostrils daily.    Dispense:  16 g    Refill:  1  . amoxicillin-clavulanate (AUGMENTIN) 875-125 MG per tablet    Sig: Take 1 tablet by mouth 2 (two) times daily.    Dispense:  20 tablet    Refill:  0   Patient Instructions  Your ears do not look infected, and no wax is blocking the canal.  The pressure is most likely coming from congestion behind the ear, and this can be from allergies or viruses. See more information on this below.  Can try new nasal spray and claritin over the counter once per day to see if this helps, saline nasal spray throughout the day if your nose feels dry.  I would avoid decongestants as heart risks with these medicines.  If headache not improving next 3-4 days, ear pain worsens, you measure a fever,  or more sinus pain or pressure - can fill the antibiotic I printed.  Your blood count (hemoglobin) was borderline low, but this can be rechecked the next time you see our primary care provider.  Return to the clinic or go to the nearest emergency room if any of your  symptoms worsen or new symptoms occur.  Serous Otitis Media Serous otitis media is fluid in the middle ear space. This space contains the bones for hearing and air. Air in the middle ear space helps to transmit sound.  The air gets there through the eustachian tube. This tube goes from the back of the nose (nasopharynx) to the middle ear space. It keeps the pressure in the middle ear the same as the outside world. It also helps to drain fluid from the middle ear space. CAUSES  Serous otitis media occurs when the eustachian tube gets blocked. Blockage can come from:  Ear infections.  Colds and other upper respiratory infections.  Allergies.  Irritants such as cigarette smoke.  Sudden changes in air pressure (such as descending in an airplane).  Enlarged adenoids.  A mass in the nasopharynx. During colds and upper respiratory infections, the middle ear space can become temporarily filled with fluid. This can happen after an ear infection also. Once the infection clears, the fluid will generally drain out of the ear through the eustachian tube. If it does not, then serous otitis media occurs. SIGNS AND SYMPTOMS   Hearing loss.  A feeling of fullness in the ear, without pain.  Young children may not show any symptoms but may show slight behavioral changes, such as agitation, ear pulling, or crying. DIAGNOSIS  Serous otitis media is diagnosed by an ear exam. Tests may be done to check on the movement of the eardrum. Hearing exams may also be done. TREATMENT  The fluid most often goes away without treatment. If allergy is the cause, allergy treatment may be helpful. Fluid that persists for several months may require minor surgery. A small tube is placed in the eardrum to:  Drain the fluid.  Restore the air in the middle ear space. In certain situations, antibiotic medicines are used to avoid surgery. Surgery may be done to remove enlarged adenoids (if this is the cause). HOME CARE  INSTRUCTIONS   Keep children away from tobacco smoke.  Keep all follow-up visits as directed by your health care provider. SEEK MEDICAL CARE IF:   Your hearing is not better in 3 months.  Your hearing is worse.  You have ear pain.  You have drainage from the ear.  You have dizziness.  You have serous otitis media only in one ear or have any bleeding from your nose (epistaxis).  You notice a lump on your neck. MAKE SURE YOU:  Understand these instructions.   Will watch your condition.   Will get help right away if you are not doing well or get worse.  Document Released: 06/12/2003 Document Revised: 08/06/2013 Document Reviewed: 10/17/2012 [facility] Patient Information 2015 [place]. This information is not intended to replace advice given to you by your health care provider. Make sure you discuss any questions you have with your health care provider.  Barotitis Media Barotitis media is inflammation of your middle ear. This occurs when the auditory tube (eustachian tube) leading from the back of your nose (nasopharynx) to your eardrum is blocked. This blockage may result from a cold, environmental allergies, or an upper respiratory infection. Unresolved barotitis media may lead to damage or hearing loss (barotrauma), which may become permanent. HOME CARE INSTRUCTIONS   Use medicines  as recommended by your health care provider. Over-the-counter medicines will help unblock the canal and can help during times of air travel.  Do not put anything into your ears to clean or unplug them. Eardrops will not be helpful.  Do not swim, dive, or fly until your health care provider says it is all right to do so. If these activities are necessary, chewing gum with frequent, forceful swallowing may help. It is also helpful to hold your nose and gently blow to pop your ears for equalizing pressure changes. This forces air into the eustachian tube.  Only take over-the-counter or  prescription medicines for pain, discomfort, or fever as directed by your health care provider.  A decongestant may be helpful in decongesting the middle ear and make pressure equalization easier. SEEK MEDICAL CARE IF:  You experience a serious form of dizziness in which you feel as if the room is spinning and you feel nauseated (vertigo).  Your symptoms only involve one ear. SEEK IMMEDIATE MEDICAL CARE IF:   You develop a severe headache, dizziness, or severe ear pain.  You have bloody or pus-like drainage from your ears.  You develop a fever.  Your problems do not improve or become worse. MAKE SURE YOU:   Understand these instructions.  Will watch your condition.  Will get help right away if you are not doing well or get worse. Document Released: 03/19/2000 Document Revised: 01/10/2013 Document Reviewed: 10/17/2012 [facility] Patient Information 2015 [place], [place]. This information is not intended to replace advice given to you by your health care provider. Make sure you discuss any questions you have with your health care provider.        I personally performed the services described in this documentation, which was scribed in my presence. The recorded information has been reviewed and considered, and addended by me as needed.

## 2014-02-01 NOTE — Patient Instructions (Addendum)
Your ears do not look infected, and no wax is blocking the canal.  The pressure is most likely coming from congestion behind the ear, and this can be from allergies or viruses. See more information on this below.  Can try new nasal spray and claritin over the counter once per day to see if this helps, saline nasal spray throughout the day if your nose feels dry.  I would avoid decongestants as heart risks with these medicines.  If headache not improving next 3-4 days, ear pain worsens, you measure a fever,  or more sinus pain or pressure - can fill the antibiotic I printed.   Your blood count (hemoglobin) was borderline low, but this can be rechecked the next time you see our primary care provider.  Return to the clinic or go to the nearest emergency room if any of your symptoms worsen or new symptoms occur.  Serous Otitis Media Serous otitis media is fluid in the middle ear space. This space contains the bones for hearing and air. Air in the middle ear space helps to transmit sound.  The air gets there through the eustachian tube. This tube goes from the back of the nose (nasopharynx) to the middle ear space. It keeps the pressure in the middle ear the same as the outside world. It also helps to drain fluid from the middle ear space. CAUSES  Serous otitis media occurs when the eustachian tube gets blocked. Blockage can come from:  Ear infections.  Colds and other upper respiratory infections.  Allergies.  Irritants such as cigarette smoke.  Sudden changes in air pressure (such as descending in an airplane).  Enlarged adenoids.  A mass in the nasopharynx. During colds and upper respiratory infections, the middle ear space can become temporarily filled with fluid. This can happen after an ear infection also. Once the infection clears, the fluid will generally drain out of the ear through the eustachian tube. If it does not, then serous otitis media occurs. SIGNS AND SYMPTOMS   Hearing  loss.  A feeling of fullness in the ear, without pain.  Young children may not show any symptoms but may show slight behavioral changes, such as agitation, ear pulling, or crying. DIAGNOSIS  Serous otitis media is diagnosed by an ear exam. Tests may be done to check on the movement of the eardrum. Hearing exams may also be done. TREATMENT  The fluid most often goes away without treatment. If allergy is the cause, allergy treatment may be helpful. Fluid that persists for several months may require minor surgery. A small tube is placed in the eardrum to:  Drain the fluid.  Restore the air in the middle ear space. In certain situations, antibiotic medicines are used to avoid surgery. Surgery may be done to remove enlarged adenoids (if this is the cause). HOME CARE INSTRUCTIONS   Keep children away from tobacco smoke.  Keep all follow-up visits as directed by your health care provider. SEEK MEDICAL CARE IF:   Your hearing is not better in 3 months.  Your hearing is worse.  You have ear pain.  You have drainage from the ear.  You have dizziness.  You have serous otitis media only in one ear or have any bleeding from your nose (epistaxis).  You notice a lump on your neck. MAKE SURE YOU:  Understand these instructions.   Will watch your condition.   Will get help right away if you are not doing well or get worse.  Document Released:  06/12/2003 Document Revised: 08/06/2013 Document Reviewed: 10/17/2012 [facility] Patient Information 2015 [place], [place]. This information is not intended to replace advice given to you by your health care provider. Make sure you discuss any questions you have with your health care provider.  Barotitis Media Barotitis media is inflammation of your middle ear. This occurs when the auditory tube (eustachian tube) leading from the back of your nose (nasopharynx) to your eardrum is blocked. This blockage may result from a cold, environmental allergies,  or an upper respiratory infection. Unresolved barotitis media may lead to damage or hearing loss (barotrauma), which may become permanent. HOME CARE INSTRUCTIONS   Use medicines as recommended by your health care provider. Over-the-counter medicines will help unblock the canal and can help during times of air travel.  Do not put anything into your ears to clean or unplug them. Eardrops will not be helpful.  Do not swim, dive, or fly until your health care provider says it is all right to do so. If these activities are necessary, chewing gum with frequent, forceful swallowing may help. It is also helpful to hold your nose and gently blow to pop your ears for equalizing pressure changes. This forces air into the eustachian tube.  Only take over-the-counter or prescription medicines for pain, discomfort, or fever as directed by your health care provider.  A decongestant may be helpful in decongesting the middle ear and make pressure equalization easier. SEEK MEDICAL CARE IF:  You experience a serious form of dizziness in which you feel as if the room is spinning and you feel nauseated (vertigo).  Your symptoms only involve one ear. SEEK IMMEDIATE MEDICAL CARE IF:   You develop a severe headache, dizziness, or severe ear pain.  You have bloody or pus-like drainage from your ears.  You develop a fever.  Your problems do not improve or become worse. MAKE SURE YOU:   Understand these instructions.  Will watch your condition.  Will get help right away if you are not doing well or get worse. Document Released: 03/19/2000 Document Revised: 01/10/2013 Document Reviewed: 10/17/2012 [facility] Patient Information 2015 [place], [place]. This information is not intended to replace advice given to you by your health care provider. Make sure you discuss any questions you have with your health care provider.

## 2014-03-11 ENCOUNTER — Other Ambulatory Visit (HOSPITAL_COMMUNITY): Payer: Self-pay | Admitting: Anesthesiology

## 2014-04-23 ENCOUNTER — Other Ambulatory Visit (HOSPITAL_COMMUNITY): Payer: Self-pay | Admitting: Cardiology

## 2014-04-23 DIAGNOSIS — I5022 Chronic systolic (congestive) heart failure: Secondary | ICD-10-CM

## 2014-04-29 ENCOUNTER — Other Ambulatory Visit (HOSPITAL_COMMUNITY): Payer: Self-pay | Admitting: Anesthesiology

## 2014-05-01 ENCOUNTER — Encounter (HOSPITAL_COMMUNITY): Payer: Self-pay

## 2014-05-01 ENCOUNTER — Ambulatory Visit (HOSPITAL_COMMUNITY): Payer: 59 | Attending: Internal Medicine

## 2014-05-06 ENCOUNTER — Ambulatory Visit: Payer: 59

## 2014-06-10 ENCOUNTER — Other Ambulatory Visit: Payer: Self-pay

## 2014-06-10 ENCOUNTER — Telehealth (HOSPITAL_COMMUNITY): Payer: Self-pay | Admitting: Surgery

## 2014-06-10 MED ORDER — HYDRALAZINE HCL 25 MG PO TABS: 25.0000 mg | ORAL_TABLET | Freq: Three times a day (TID) | ORAL | Status: DC

## 2014-06-10 MED ORDER — LISINOPRIL 40 MG PO TABS: 40.0000 mg | ORAL_TABLET | Freq: Every day | ORAL | Status: DC

## 2014-06-10 MED ORDER — FELODIPINE ER 2.5 MG PO TB24: 2.5000 mg | ORAL_TABLET | Freq: Every day | ORAL | Status: DC

## 2014-06-10 MED ORDER — CARVEDILOL 25 MG PO TABS: 25.0000 mg | ORAL_TABLET | Freq: Two times a day (BID) | ORAL | Status: DC

## 2014-06-10 NOTE — Telephone Encounter (Signed)
Called patient in reference to moving upcoming appt.  He wanted to keep appt and stated that he was out of Felodipine, Lisinopril, Hydralazine and Coreg at this time.  He told me that he called on Friday for a refill.  I will send in refill to pharmacy at [facility] on [address] per patient's request.

## 2014-06-11 MED ORDER — CARVEDILOL 25 MG PO TABS: 25.0000 mg | ORAL_TABLET | Freq: Two times a day (BID) | ORAL | Status: DC

## 2014-06-18 ENCOUNTER — Encounter (HOSPITAL_COMMUNITY): Payer: Self-pay

## 2014-06-18 ENCOUNTER — Ambulatory Visit (HOSPITAL_COMMUNITY)
Admission: RE | Admit: 2014-06-18 | Discharge: 2014-06-18 | Disposition: A | Discharge status: Home / Self Care | Payer: 59 | Source: Ambulatory Visit | Attending: Internal Medicine | Admitting: Internal Medicine

## 2014-06-18 VITALS — BP 150/92 | HR 93 | Wt 255.5 lb

## 2014-06-18 DIAGNOSIS — I5022 Chronic systolic (congestive) heart failure: Secondary | ICD-10-CM

## 2014-06-18 DIAGNOSIS — I10 Essential (primary) hypertension: Secondary | ICD-10-CM | POA: Diagnosis not present

## 2014-06-18 DIAGNOSIS — I509 Heart failure, unspecified: Secondary | ICD-10-CM

## 2014-06-18 DIAGNOSIS — N529 Male erectile dysfunction, unspecified: Secondary | ICD-10-CM | POA: Insufficient documentation

## 2014-06-18 DIAGNOSIS — E669 Obesity, unspecified: Secondary | ICD-10-CM | POA: Diagnosis not present

## 2014-06-18 DIAGNOSIS — G4733 Obstructive sleep apnea (adult) (pediatric): Secondary | ICD-10-CM | POA: Insufficient documentation

## 2014-06-18 LAB — BASIC METABOLIC PANEL
ANION GAP: 8 (ref 5–15)
BUN: 16 mg/dL (ref 6–23)
CO2: 25 mmol/L (ref 19–32)
CREATININE: 1 mg/dL (ref 0.50–1.35)
Calcium: 9.3 mg/dL (ref 8.4–10.5)
Chloride: 105 mmol/L (ref 96–112)
GFR calc Af Amer: >90 mL/min (ref >90)
GFR calc non Af Amer: 87 mL/min — ABNORMAL LOW (ref >90)
Glucose, Bld: 104 mg/dL — ABNORMAL HIGH (ref 70–99)
Potassium: 4 mmol/L (ref 3.5–5.1)
Sodium: 138 mmol/L (ref 135–145)

## 2014-06-18 NOTE — Addendum Note (Signed)
Encounter addended by: Modesta Messing, CMA on: 06/18/2014  2:38 PM<BR>     Documentation filed: Patient Instructions Section, Dx Association, Orders

## 2014-06-18 NOTE — Progress Notes (Signed)
Patient ID: Caleb Garcia, male   DOB: 1965-10-10, 49 y.o.   MRN: [id number] PCP: Dr Artist Pais   HPI: Caleb Garcia is a 49 year old, African American male, patient with history of HTN, obesity, HTN, OSA s/p UPPP, probable familial nonischemic cardiomyopathy (probably hypertensive), chronic systolic HF and previous occiptial stroke. Cardiac catheterization on October 08, 2008 showed no significant coronary artery disease with moderately severe global LV dysfunction. Ejection fraction 40% with moderate MR. Very mild OSA with AHI 8. Saw Dr. Shelle Garcia and decided to keep off CPAP for now.    He returns for follow up. Has been out of plendil and carvedilol for a week.  Denies SOB, orthopnea, PND, or CP.  Not exercising. Not following a particular diet. Following a low salt diet. Works [employer] for [employer].   ECHO 10/2008 25-30%  ECHO cho 02/2009 EF 35-40%  ECHO 04/2013 EF 35-40% ECHO 06/18/2014: EF 40-45% RV ok.     SH: Lives with his wife . Has alcohol on occasion. No drug use.  FH: Brother has HTN        Mom kidney transplant Dad throat cancer   ROS: All systems negative except as listed in HPI, PMH and Problem List.  Past Medical History  Diagnosis Date  . Hypokalemia   . Systolic CHF   . Glucose intolerance (impaired glucose tolerance)   . Cardiomyopathy     Non-ischemic  . Obesity   . HTN (hypertension)   . OSA (obstructive sleep apnea)   . CHF (congestive heart failure)   . Pre-diabetes   . Diabetes mellitus without complication   . Hyperlipidemia      Current Outpatient Prescriptions  Medication Sig Dispense Refill  . aspirin 81 MG tablet Take 81 mg by mouth daily.      Marland Kitchen atorvastatin (LIPITOR) 20 MG tablet Take 1 tablet (20 mg total) by mouth daily. 90 tablet 3  . carvedilol (COREG) 25 MG tablet Take 1 tablet (25 mg total) by mouth 2 (two) times daily with a meal. 60 tablet 0  . felodipine (PLENDIL) 2.5 MG 24 hr tablet Take 1 tablet (2.5 mg total) by mouth daily. 30 tablet 3  .  furosemide (LASIX) 40 MG tablet TAKE ONE TABLET BY MOUTH ONCE DAILY 30 tablet 6  . hydrALAZINE (APRESOLINE) 25 MG tablet Take 1 tablet (25 mg total) by mouth 3 (three) times daily. 90 tablet 0  . lisinopril (PRINIVIL,ZESTRIL) 40 MG tablet Take 1 tablet (40 mg total) by mouth daily. 30 tablet 0  . potassium chloride (KLOR-CON M10) 10 MEQ tablet TAKE ONE TABLET BY MOUTH EVERY DAY 30 tablet 6  . spironolactone (ALDACTONE) 25 MG tablet Take 2 tablets (50 mg total) by mouth daily. 60 tablet 6  . VIAGRA 100 MG tablet TAKE ONE TABLET BY MOUTH DAILY  AS NEEDED FOR ERECTILE DYSFUNCTION 5 tablet 0  . fluticasone (FLONASE) 50 MCG/ACT nasal spray Place 2 sprays into the nose daily. 16 g 1  . fluticasone (FLONASE) 50 MCG/ACT nasal spray Place 2 sprays into both nostrils daily. (Patient not taking: Reported on 06/18/2014) 16 g 1  . meloxicam (MOBIC) 15 MG tablet Take 1 tablet (15 mg total) by mouth daily. (Patient not taking: Reported on 06/18/2014) 30 tablet 0  . metFORMIN (GLUCOPHAGE) 500 MG tablet Take 1 tablet (500 mg total) by mouth 2 (two) times daily with a meal. (Patient not taking: Reported on 06/18/2014) 60 tablet 3   No current facility-administered medications for this encounter.    Filed  Vitals:   06/18/14 1357  BP: 150/92  Pulse: 93  Weight: 255 lb 8 oz (115.894 kg)  SpO2: 100%    PHYSICAL EXAM: General:  Well appearing. No resp difficulty HEENT: normal Neck: supple. JVP flat. Carotids 2+ bilaterally; no bruits. No lymphadenopathy or thryomegaly appreciated. Cor: PMI normal. Regular rate & rhythm. No rubs, gallops or murmurs. Lungs: clear Abdomen: obese soft, nontender, nondistended. No hepatosplenomegaly. No bruits or masses. Good bowel sounds. Extremities: no cyanosis, clubbing, rash, edema Neuro: alert & orientedx3, cranial nerves grossly intact. Moves all 4 extremities w/o difficulty. R and L lower extremities equal strength.    ASSESSMENT & PLAN:  1) Chronic systolic HF: NICM, EF  40-45% RV normal.   Dr Caleb Garcia discussed and reviewed.  NYHA I symptoms. Volume status stable. Continue lasix 40 mg daily. Today I will reorder carvedilol and plendil. Has been out of for 1 week.   -On goal dose coreg 25 mg twice a day and lisinopril 40 mg daily. .   -Continue hydralazine 25 mg TID, spiro 50 mg daily.  -He is not on nitrate due to headaches and takes viagra.  - Reinforced the need and importance of daily weights, a low sodium diet, and fluid restriction (less than 2 L a day). Instructed to call the HF clinic if weight increases more than 3 lbs overnight or 5 lbs in a week. 2) Obesity - Discussed increasing exercise and trying to watch his portion sizes.  3) HTN-  Elevated. Has been off carvedilol and plendil for a week.   4) OSA -- Mild on AHI scale. Not on CPAP per Dr Caleb Garcia.   5) Erectile dysfunction- -Viagra ineffective. Refer to PCP    Follow up in 6 months.   Caleb Garcia,AMY NP-C 2:14 PM   Patient seen and examined with Caleb Becket, NP. We discussed all aspects of the encounter. I agree with the assessment and plan as stated above.   Echo reviewed personally. EF continues to improve. Doing great. NYHA I. Volume status looks good. BP up in setting of running out of meds. Will restart. F/u in 6 months.   Caleb Garcia 2:32 PM

## 2014-06-18 NOTE — Progress Notes (Signed)
  Echocardiogram 2D Echocardiogram has been performed.  Cathie Beams 06/18/2014, 1:53 PM

## 2014-06-18 NOTE — Patient Instructions (Signed)
Labs today.  FOLLOW UP in 6 months.

## 2014-07-22 ENCOUNTER — Other Ambulatory Visit: Payer: Self-pay | Admitting: Internal Medicine

## 2014-07-22 ENCOUNTER — Other Ambulatory Visit (HOSPITAL_COMMUNITY): Payer: Self-pay | Admitting: Internal Medicine

## 2014-07-22 DIAGNOSIS — I5022 Chronic systolic (congestive) heart failure: Secondary | ICD-10-CM

## 2014-10-05 ENCOUNTER — Other Ambulatory Visit: Payer: Self-pay | Admitting: Internal Medicine

## 2014-11-04 ENCOUNTER — Other Ambulatory Visit (HOSPITAL_COMMUNITY): Payer: Self-pay | Admitting: Internal Medicine

## 2014-11-14 ENCOUNTER — Telehealth: Payer: Self-pay | Admitting: Internal Medicine

## 2014-11-14 ENCOUNTER — Other Ambulatory Visit: Payer: Self-pay | Admitting: Internal Medicine

## 2014-11-14 NOTE — Telephone Encounter (Signed)
Pt needs refills today. Pt is out of furosemide#30,spironolactone #60 and potassium chloride#30 w/refills . [facility] [address]

## 2014-11-14 NOTE — Telephone Encounter (Signed)
Refills sent

## 2014-11-29 ENCOUNTER — Ambulatory Visit (INDEPENDENT_AMBULATORY_CARE_PROVIDER_SITE_OTHER): Payer: 59

## 2014-11-29 ENCOUNTER — Ambulatory Visit (INDEPENDENT_AMBULATORY_CARE_PROVIDER_SITE_OTHER): Payer: 59 | Admitting: Family Medicine

## 2014-11-29 VITALS — BP 110/70 | HR 81 | Temp 98.6°F | Resp 16 | Ht 70.0 in | Wt 260.0 lb

## 2014-11-29 DIAGNOSIS — M25562 Pain in left knee: Secondary | ICD-10-CM | POA: Diagnosis not present

## 2014-11-29 DIAGNOSIS — M179 Osteoarthritis of knee, unspecified: Secondary | ICD-10-CM | POA: Diagnosis not present

## 2014-11-29 DIAGNOSIS — M1712 Unilateral primary osteoarthritis, left knee: Secondary | ICD-10-CM

## 2014-11-29 MED ORDER — INDOMETHACIN 50 MG PO CAPS: 50.0000 mg | ORAL_CAPSULE | Freq: Three times a day (TID) | ORAL | Status: DC | PRN

## 2014-11-29 NOTE — Patient Instructions (Signed)
You have some mild arthritis and some bone spurring in the left knee.  Please take the indomethacin three times daily for the next month. Do not take other NSAIDS like ibuprofen, advil, or aleve while taking this. Do not take your mobic while taking this.  Wearing the brace when you're active and working will help.  Icing the area can help.  We've referred you to ortho and will be in touch with you to let you know about that appointment.  If the knee does not feel better on Sunday please come back to see [place] for further workup. If you start having fevers or chills, if you notice redness or warmth around the knee please come back to see [place] ASAP!

## 2014-11-29 NOTE — Progress Notes (Signed)
   Subjective:    Patient ID: Caleb Garcia, male    DOB: June 22, 1965, 49 y.o.   MRN: [id number]  Chief Complaint  Patient presents with  . Knee Pain    (L) knee x 2 weeks   Medications, allergies, past medical history, surgical history, family history, social history and problem list reviewed and updated.  HPI  49 yof presents with left knee pain.   Left knee pain 2 wks. No trauma. Started gradually. All over knee, possibly worst medial. No particular movements worsen it but all activity is painful. Rated 8/10 at rest. Denies hx gout. Denies fevers, chills. Has been icing knee and propping up in bed at night which helps.   Hurt knee in high school football and had scoped 20 yrs ago. Unsure what the issue was.   Review of Systems See HPI     Objective:   Physical Exam  Constitutional: He is oriented to person, place, and time.  BP 110/70 mmHg  Pulse 81  Temp(Src) 98.6 F (37 C) (Oral)  Resp 16  Ht 5\' 10"  (1.778 m)  Wt 260 lb (117.935 kg)  BMI 37.31 kg/m2  SpO2 98%   Musculoskeletal:       Left hip: Normal.       Left ankle: Normal.  Mild diffuse swelling, no effusion. No warmth or erythema. TTP globally, worst at lateral and medial joint line. Minimal testing due to pt pain but no laxity with joint testing. Neg grind test.   Neurological: He is alert and oriented to person, place, and time.  Psychiatric: He has a normal mood and affect. His speech is normal and behavior is normal.   [facility] reading (PRIMARY) by  Dr. Patsy Lager. Left knee findings: Bone spurring. Mild degenerative chahnge.      Assessment & Plan:   Left knee pain - Plan: DG Knee Complete 4 Views Left, indomethacin (INDOCIN) 50 MG capsule, Ambulatory referral to Orthopedic Surgery  Osteoarthritis of left knee, unspecified osteoarthritis type - Plan: Ambulatory referral to Orthopedic Surgery --xr with likely mild oa --discussed tylenol qd/prn as 1st line, icing prn --indomethacin tid for one month --knee  brace placed today --ortho referral for further management --rtc 2 days if no improvement in pain, rtc asap with persistent fevers, redness, or warmth around the joint or with worsening pain, pt agreeable  Donnajean Lopes, PA-C Physician Assistant-Certified [facility]  11/29/2014 11:25 AM

## 2014-12-03 ENCOUNTER — Telehealth: Payer: Self-pay | Admitting: Family Medicine

## 2014-12-03 NOTE — Telephone Encounter (Signed)
Called him- on review of his note, I would recommend that he actually not take the indomethacin scheduled for a month but use prn due to risk of GI effects.  He understands and plans to call [place] [facility] back today to schedule his follow-up visit

## 2014-12-04 ENCOUNTER — Ambulatory Visit (INDEPENDENT_AMBULATORY_CARE_PROVIDER_SITE_OTHER): Payer: Commercial Managed Care - HMO | Admitting: Adult Health

## 2014-12-04 ENCOUNTER — Ambulatory Visit: Payer: 59 | Admitting: Internal Medicine

## 2014-12-04 ENCOUNTER — Encounter: Payer: Self-pay | Admitting: Adult Health

## 2014-12-04 VITALS — BP 122/78 | Temp 98.5°F | Ht 70.0 in | Wt 264.0 lb

## 2014-12-04 DIAGNOSIS — Z760 Encounter for issue of repeat prescription: Secondary | ICD-10-CM | POA: Diagnosis not present

## 2014-12-04 DIAGNOSIS — E119 Type 2 diabetes mellitus without complications: Secondary | ICD-10-CM

## 2014-12-04 DIAGNOSIS — I5022 Chronic systolic (congestive) heart failure: Secondary | ICD-10-CM | POA: Diagnosis not present

## 2014-12-04 LAB — BASIC METABOLIC PANEL
BUN: 14 mg/dL (ref 6–23)
CHLORIDE: 102 meq/L (ref 96–112)
CO2: 27 meq/L (ref 19–32)
CREATININE: 1 mg/dL (ref 0.40–1.50)
Calcium: 9.7 mg/dL (ref 8.4–10.5)
GFR: 102.09 mL/min (ref >60.00)
Glucose, Bld: 135 mg/dL — ABNORMAL HIGH (ref 70–99)
Potassium: 3.9 mEq/L (ref 3.5–5.1)
Sodium: 136 mEq/L (ref 135–145)

## 2014-12-04 LAB — HEMOGLOBIN A1C: HEMOGLOBIN A1C: 7 % — AB (ref 4.6–6.5)

## 2014-12-04 MED ORDER — FUROSEMIDE 40 MG PO TABS: 40.0000 mg | ORAL_TABLET | Freq: Every day | ORAL | Status: DC

## 2014-12-04 MED ORDER — TADALAFIL 20 MG PO TABS: 20.0000 mg | ORAL_TABLET | Freq: Every day | ORAL | Status: DC | PRN

## 2014-12-04 MED ORDER — FELODIPINE ER 2.5 MG PO TB24: 2.5000 mg | ORAL_TABLET | Freq: Every day | ORAL | Status: DC

## 2014-12-04 MED ORDER — POTASSIUM CHLORIDE ER 10 MEQ PO TBCR: 10.0000 meq | EXTENDED_RELEASE_TABLET | Freq: Every day | ORAL | Status: DC

## 2014-12-04 MED ORDER — SPIRONOLACTONE 25 MG PO TABS: 50.0000 mg | ORAL_TABLET | Freq: Every day | ORAL | Status: DC

## 2014-12-04 MED ORDER — METFORMIN HCL 500 MG PO TABS: 500.0000 mg | ORAL_TABLET | Freq: Two times a day (BID) | ORAL | Status: DC

## 2014-12-04 MED ORDER — LISINOPRIL 40 MG PO TABS: 40.0000 mg | ORAL_TABLET | Freq: Every day | ORAL | Status: DC

## 2014-12-04 NOTE — Patient Instructions (Signed)
It was great meeting you today!  Please continue to work on diet and exercise as this is the single best thing you can do for your diabetes.   I will follow up with you regarding your blood work.   Please follow up with Dr. Artist Pais for your physical exam

## 2014-12-04 NOTE — Progress Notes (Signed)
Pre visit review using our clinic review tool, if applicable. No additional management support is needed unless otherwise documented below in the visit note. 

## 2014-12-04 NOTE — Progress Notes (Signed)
Subjective:    Patient ID: Caleb Garcia, male    DOB: 03-21-1966, 49 y.o.   MRN: [id number]  HPI  49 year old male who presents to the office today for follow up regarding medication refills and diabetes. He has not been seen by Dr. Artist Pais in longer than one year. The last time he was seen he as diagnosed with DM II. He was started on Metformin 500mg , in which he endorses that he takes. He is not dieting like he should be and he is not exercising.  He has no other issues he would like discussed today.   He has not been fasting.   Review of Systems  Constitutional: Negative.   Eyes: Negative.   Respiratory: Negative.   Cardiovascular: Negative.   Gastrointestinal: Negative.   Genitourinary: Negative.   Skin: Negative.   Neurological: Negative.   All other systems reviewed and are negative.  Past Medical History  Diagnosis Date  . Hypokalemia   . Systolic CHF   . Glucose intolerance (impaired glucose tolerance)   . Cardiomyopathy     Non-ischemic  . Obesity   . HTN (hypertension)   . OSA (obstructive sleep apnea)   . CHF (congestive heart failure)   . Pre-diabetes   . Diabetes mellitus without complication   . Hyperlipidemia     Social History   Social History  . Marital Status: Married    Spouse Name: N/A  . Number of Children: N/A  . Years of Education: N/A   Occupational History  .  Unemployed   Social History Main Topics  . Smoking status: Never Smoker   . Smokeless tobacco: Not on file  . Alcohol Use: No  . Drug Use: No  . Sexual Activity: Not on file   Other Topics Concern  . 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 Cardiac catheterization  10/08/08  . Tonsillectomy    . Uvulopalatopharyngoplasty      Family History  Problem Relation Age of Onset  .  Cancer Father     Throat cancer  . Diabetes Mother   . Hypertension Mother   . Heart disease Mother   . Hypertension Mother   . Diabetes Mother   . Stroke Father   . Prostate cancer Neg Hx   . Colon cancer Neg Hx     Allergies  Allergen Reactions  . Shrimp [Shellfish Allergy]     Current Outpatient Prescriptions on File Prior to Visit  Medication Sig Dispense Refill  . aspirin 81 MG tablet Take 81 mg by mouth daily.      Marland Kitchen atorvastatin (LIPITOR) 20 MG tablet Take 1 tablet (20 mg total) by mouth daily. 90 tablet 3  . carvedilol (COREG) 25 MG tablet Take 1 tablet (25 mg total) by mouth 2 (two) times daily with a meal. 60 tablet 0  . carvedilol (COREG) 25 MG tablet TAKE ONE TABLET BY MOUTH TWICE DAILY WITH  A  MEAL 60 tablet 6  . hydrALAZINE (APRESOLINE) 25 MG tablet TAKE ONE TABLET BY MOUTH THREE TIMES DAILY 90 tablet 6  . indomethacin (INDOCIN) 50 MG capsule Take 1 capsule (50 mg total) by mouth 3 (three) times daily as needed for mild pain. 30 capsule 2   No current  facility-administered medications on file prior to visit.    BP 122/78 mmHg  Temp(Src) 98.5 F (36.9 C) (Oral)  Ht  (1.778 m)  Wt 264 lb (119.75 kg)  BMI 37.88 kg/m2       Objective:   Physical Exam  Constitutional: He is oriented to person, place, and time. He appears well-developed and well-nourished. No distress.  Eyes: Right eye exhibits no discharge. Left eye exhibits no discharge.  Cardiovascular: Normal rate, regular rhythm, normal heart sounds and intact distal pulses.  Exam reveals no gallop and no friction rub.   No murmur heard. Pulmonary/Chest: Effort normal and breath sounds normal. No respiratory distress. He has no wheezes. He has no rales. He exhibits no tenderness.  Abdominal: Soft. Bowel sounds are normal. He exhibits no distension and no mass. There is no tenderness. There is no rebound and no guarding.  Musculoskeletal: Normal range of motion. He exhibits no edema or tenderness.    Neurological: He is alert and oriented to person, place, and time.  Skin: Skin is warm and dry. No rash noted. He is not diaphoretic. No erythema. No pallor.  Psychiatric: He has a normal mood and affect. His behavior is normal. Judgment and thought content normal.  Nursing note and vitals reviewed.     Assessment & Plan:  1. Diabetes type 2, controlled - Hemoglobin A1c - Basic metabolic panel - metFORMIN (GLUCOPHAGE) 500 MG tablet; Take 1 tablet (500 mg total) by mouth 2 (two) times daily with a meal.  Dispense: 60 tablet; Refill: 6 - Stressed importance of following a diabetic diet, portion control, and exercise.  - Follow up in one month for CPE  2. Medication refill  - Hemoglobin A1c - Basic metabolic panel - tadalafil (CIALIS) 20 MG tablet; Take 1 tablet (20 mg total) by mouth daily as needed for erectile dysfunction.  Dispense: 10 tablet; Refill: 3 - potassium chloride (K-DUR) 10 MEQ tablet; Take 1 tablet (10 mEq total) by mouth daily.  Dispense: 30 tablet; Refill: 6 - spironolactone (ALDACTONE) 25 MG tablet; Take 2 tablets (50 mg total) by mouth daily.  Dispense: 60 tablet; Refill: 6 - metFORMIN (GLUCOPHAGE) 500 MG tablet; Take 1 tablet (500 mg total) by mouth 2 (two) times daily with a meal.  Dispense: 60 tablet; Refill: 6 - lisinopril (PRINIVIL,ZESTRIL) 40 MG tablet; Take 1 tablet (40 mg total) by mouth daily.  Dispense: 30 tablet; Refill: 6 - furosemide (LASIX) 40 MG tablet; Take 1 tablet (40 mg total) by mouth daily.  Dispense: 30 tablet; Refill: 6 - felodipine (PLENDIL) 2.5 MG 24 hr tablet; Take 1 tablet (2.5 mg total) by mouth daily.  Dispense: 30 tablet; Refill: 6  3. Chronic systolic heart failure  - Basic metabolic panel - potassium chloride (K-DUR) 10 MEQ tablet; Take 1 tablet (10 mEq total) by mouth daily.  Dispense: 30 tablet; Refill: 6 - spironolactone (ALDACTONE) 25 MG tablet; Take 2 tablets (50 mg total) by mouth daily.  Dispense: 60 tablet; Refill: 6 -  lisinopril (PRINIVIL,ZESTRIL) 40 MG tablet; Take 1 tablet (40 mg total) by mouth daily.  Dispense: 30 tablet; Refill: 6 - furosemide (LASIX) 40 MG tablet; Take 1 tablet (40 mg total) by mouth daily.  Dispense: 30 tablet; Refill: 6

## 2015-02-12 ENCOUNTER — Other Ambulatory Visit: Payer: Commercial Managed Care - HMO

## 2015-02-19 ENCOUNTER — Encounter: Payer: Commercial Managed Care - HMO | Admitting: Internal Medicine

## 2015-03-21 ENCOUNTER — Ambulatory Visit (INDEPENDENT_AMBULATORY_CARE_PROVIDER_SITE_OTHER): Payer: 59 | Admitting: Emergency Medicine

## 2015-03-21 VITALS — BP 112/82 | HR 85 | Temp 98.5°F | Resp 18 | Ht 70.0 in | Wt 257.0 lb

## 2015-03-21 DIAGNOSIS — A084 Viral intestinal infection, unspecified: Secondary | ICD-10-CM

## 2015-03-21 NOTE — Patient Instructions (Signed)

## 2015-03-21 NOTE — Progress Notes (Signed)
Subjective:  Patient ID: Caleb Garcia, male    DOB: May 06, 1965  Age: 49 y.o. MRN: [id number]  CC: Diarrhea and Fatigue   HPI Caleb Garcia presents  with a three-day history of nausea vomiting and diarrhea. He denies any blood mucus or pus in stool. He had an ability only drink liquids with no food. Denies any ill contacts and no fever or chills. His symptoms have improved. He has no abdominal pain bloating dysuria urgency or frequency denies any other complaints. He has taken no medication for his complaints. He has no cough rhinorrhea or sore throat.  History Caleb Garcia has a past medical history of Hypokalemia; Systolic CHF (HCC); Glucose intolerance (impaired glucose tolerance); Cardiomyopathy; Obesity; HTN (hypertension); OSA (obstructive sleep apnea); CHF (congestive heart failure) (HCC); Pre-diabetes; Diabetes mellitus without complication (HCC); and Hyperlipidemia.   He has past surgical history that includes Cardiac catheterization (10/08/08); Tonsillectomy; and Uvulopalatopharyngoplasty.   His  family history includes Cancer in his father; Diabetes in his mother and mother; Heart disease in his mother; Hypertension in his mother and mother; Stroke in his father. There is no history of Prostate cancer or Colon cancer.  He   reports that he has never smoked. He does not have any smokeless tobacco history on file. He reports that he does not drink alcohol or use illicit drugs.  Outpatient Prescriptions Prior to Visit  Medication Sig Dispense Refill  . aspirin 81 MG tablet Take 81 mg by mouth daily.      Marland Kitchen atorvastatin (LIPITOR) 20 MG tablet Take 1 tablet (20 mg total) by mouth daily. 90 tablet 3  . carvedilol (COREG) 25 MG tablet TAKE ONE TABLET BY MOUTH TWICE DAILY WITH  A  MEAL 60 tablet 6  . felodipine (PLENDIL) 2.5 MG 24 hr tablet Take 1 tablet (2.5 mg total) by mouth daily. 30 tablet 6  . furosemide (LASIX) 40 MG tablet Take 1 tablet (40 mg total) by mouth daily. 30 tablet  6  . hydrALAZINE (APRESOLINE) 25 MG tablet TAKE ONE TABLET BY MOUTH THREE TIMES DAILY 90 tablet 6  . lisinopril (PRINIVIL,ZESTRIL) 40 MG tablet Take 1 tablet (40 mg total) by mouth daily. 30 tablet 6  . metFORMIN (GLUCOPHAGE) 500 MG tablet Take 1 tablet (500 mg total) by mouth 2 (two) times daily with a meal. 60 tablet 6  . potassium chloride (K-DUR) 10 MEQ tablet Take 1 tablet (10 mEq total) by mouth daily. 30 tablet 6  . spironolactone (ALDACTONE) 25 MG tablet Take 2 tablets (50 mg total) by mouth daily. 60 tablet 6  . carvedilol (COREG) 25 MG tablet Take 1 tablet (25 mg total) by mouth 2 (two) times daily with a meal. 60 tablet 0  . indomethacin (INDOCIN) 50 MG capsule Take 1 capsule (50 mg total) by mouth 3 (three) times daily as needed for mild pain. (Patient not taking: Reported on 03/21/2015) 30 capsule 2  . tadalafil (CIALIS) 20 MG tablet Take 1 tablet (20 mg total) by mouth daily as needed for erectile dysfunction. (Patient not taking: Reported on 03/21/2015) 10 tablet 3   No facility-administered medications prior to visit.    Social History   Social History  . Marital Status: Married    Spouse Name: N/A  . Number of Children: N/A  . Years of Education: N/A   Occupational History  .  Unemployed   Social History Main Topics  . Smoking status: Never Smoker   . Smokeless tobacco: None  . Alcohol Use:  No  . Drug Use: No  . Sexual Activity: Not Asked   Other Topics Concern  . 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 Negative for fever and appetite change.  HENT: Negative for congestion, ear pain, postnasal drip, sinus pressure and sore throat.   Eyes: Negative for pain and redness.  Respiratory: Negative for cough, shortness of breath and wheezing.   Cardiovascular:  Negative for leg swelling.  Gastrointestinal: Positive for nausea, vomiting, diarrhea and abdominal distention. Negative for abdominal pain, constipation and blood in stool.  Endocrine: Negative for polyuria.  Genitourinary: Negative for dysuria, urgency, frequency and flank pain.  Musculoskeletal: Negative for gait problem.  Skin: Negative for rash.  Neurological: Negative for weakness and headaches.  Psychiatric/Behavioral: Negative for confusion and decreased concentration. The patient is not nervous/anxious.     Objective:  BP 112/82 mmHg  Pulse 85  Temp(Src) 98.5 F (36.9 C) (Oral)  Resp 18  Ht  (1.778 m)  Wt 257 lb (116.574 kg)  BMI 36.88 kg/m2  SpO2 99%  Physical Exam  Constitutional: He is oriented to person, place, and time. He appears well-developed and well-nourished.  HENT:  Head: Normocephalic and atraumatic.  Eyes: Conjunctivae are normal. Pupils are equal, round, and reactive to light.  Neck: Normal range of motion. Neck supple.  Cardiovascular: Normal rate and regular rhythm.   Pulmonary/Chest: Effort normal. No respiratory distress.  Abdominal: Soft. Bowel sounds are normal. There is no tenderness. There is no guarding.  Musculoskeletal: He exhibits no edema.  Neurological: He is alert and oriented to person, place, and time.  Skin: Skin is warm and dry.  Psychiatric: He has a normal mood and affect. His behavior is normal. Thought content normal.      Assessment & Plan:   Caleb Garcia was seen today for diarrhea and fatigue.  Diagnoses and all orders for this visit:  Viral gastroenteritis  I am having Caleb Garcia maintain his aspirin, atorvastatin, hydrALAZINE, carvedilol, indomethacin, tadalafil, potassium chloride, spironolactone, metFORMIN, lisinopril, furosemide, and felodipine.  No orders of the defined types were placed in this encounter.    Appropriate red flag conditions were discussed with the patient as well as actions that should be  taken.  Patient expressed his understanding.  Follow-up: Return if symptoms worsen or fail to improve.  Carmelina Dane, MD

## 2015-04-23 ENCOUNTER — Ambulatory Visit (INDEPENDENT_AMBULATORY_CARE_PROVIDER_SITE_OTHER): Payer: Self-pay | Admitting: Urgent Care

## 2015-04-23 VITALS — BP 112/86 | HR 81 | Temp 98.3°F | Resp 17 | Ht 69.5 in | Wt 256.0 lb

## 2015-04-23 DIAGNOSIS — E119 Type 2 diabetes mellitus without complications: Secondary | ICD-10-CM

## 2015-04-23 DIAGNOSIS — I5022 Chronic systolic (congestive) heart failure: Secondary | ICD-10-CM

## 2015-04-23 DIAGNOSIS — E782 Mixed hyperlipidemia: Secondary | ICD-10-CM

## 2015-04-23 DIAGNOSIS — Z024 Encounter for examination for driving license: Secondary | ICD-10-CM

## 2015-04-23 DIAGNOSIS — Z021 Encounter for pre-employment examination: Secondary | ICD-10-CM

## 2015-04-23 DIAGNOSIS — I10 Essential (primary) hypertension: Secondary | ICD-10-CM

## 2015-04-23 NOTE — Patient Instructions (Signed)

## 2015-04-23 NOTE — Progress Notes (Signed)
Commercial Driver Medical Examination   Caleb Garcia is a 50 y.o. male who presents today for a DOT physical exam.  Chronic CHF, HTN - managed by Dr. Gala Romney. Echo has shown steady improvement since 2010, last echo showed mild dysfunction and EF ~40-45%.  Diabetes - managed by Dr. Artist Pais with Metformin. Controlled per last 2 A1c levels.  Sleep Apnea - resolved s/p surgery.  He has a current medication list which includes the following prescription(s): aspirin, atorvastatin, carvedilol, felodipine, furosemide, hydralazine, lisinopril, metformin, potassium chloride, spironolactone, indomethacin, and tadalafil.  His  has a past medical history of Hypokalemia; Systolic CHF (HCC); Glucose intolerance (impaired glucose tolerance); Cardiomyopathy; Obesity; HTN (hypertension); OSA (obstructive sleep apnea); CHF (congestive heart failure) (HCC); Pre-diabetes; Diabetes mellitus without complication (HCC); and Hyperlipidemia. Also  has past surgical history that includes Cardiac catheterization (10/08/08); Tonsillectomy; and Uvulopalatopharyngoplasty.  Objective:   BP 112/86 mmHg  Pulse 81  Temp(Src) 98.3 F (36.8 C) (Oral)  Resp 17  Ht 5' 9.5" (1.765 m)  Wt 256 lb (116.121 kg)  BMI 37.28 kg/m2  SpO2 97%  Vision/hearing:  Visual Acuity Screening   Right eye Left eye Both eyes  Without correction:     With correction: 20/13 20/13 20/13   Comments: The patient can distinguish the colors red, amber and green.  Peripheral: R 85 L 85  Applicant can recognize and distinguish among traffic control signals and devices showing standard red, green, and amber colors.  Corrective lenses required: Yes  Monocular Vision?: No  Hearing aid requirement: No  Physical Exam  Constitutional: He is oriented to person, place, and time. He appears well-developed and well-nourished.  HENT:  TM's intact bilaterally, no effusions or erythema. Nasal turbinates pink and moist, nasal passages patent. No sinus  tenderness. Oropharynx clear, mucous membranes moist, dentition in good repair. Uvulopalatopharyngoplasty visualized.  Eyes: Conjunctivae and EOM are normal. Pupils are equal, round, and reactive to light. Right eye exhibits no discharge. Left eye exhibits no discharge. No scleral icterus.  Neck: Normal range of motion. Neck supple. No thyromegaly present.  Cardiovascular: Normal rate, regular rhythm and intact distal pulses.  Exam reveals no gallop and no friction rub.   No murmur heard. Pulmonary/Chest: No stridor. No respiratory distress. He has no wheezes. He has no rales.  Abdominal: Soft. Bowel sounds are normal. He exhibits no distension and no mass. There is no tenderness.  Musculoskeletal: Normal range of motion. He exhibits no edema or tenderness.  Lymphadenopathy:    He has no cervical adenopathy.  Neurological: He is alert and oriented to person, place, and time.  Skin: Skin is warm and dry. No rash noted. No erythema. No pallor.  Psychiatric: He has a normal mood and affect.   Labs: Comments: Urine Dip: Sp. Gr. 1.010 Blood: Neg Protein: Neg Glucose: Neg  Assessment:    Healthy male exam.  Meets standards, but periodic monitoring required due to chronic CHF, HTN, Diabetes.  Driver qualified only for 1 year.    Plan:    Medical examiners certificate completed and printed. Return as needed.

## 2015-07-09 ENCOUNTER — Other Ambulatory Visit (HOSPITAL_COMMUNITY): Payer: Self-pay | Admitting: Internal Medicine

## 2015-08-20 ENCOUNTER — Other Ambulatory Visit: Payer: Self-pay | Admitting: Internal Medicine

## 2015-10-01 ENCOUNTER — Other Ambulatory Visit: Payer: Self-pay | Admitting: Adult Health

## 2015-10-01 ENCOUNTER — Other Ambulatory Visit: Payer: Self-pay | Admitting: Internal Medicine

## 2015-10-01 NOTE — Telephone Encounter (Signed)
Ok to refill 

## 2015-10-02 NOTE — Telephone Encounter (Signed)
He can have 30 days with 0 refills. Since Dr. Artist Pais is not coming back he needs to establish with a new provider. Raynelle Fanning or Dr. [place] would probably be able to get him in quicker.

## 2015-11-25 ENCOUNTER — Ambulatory Visit: Payer: Self-pay

## 2015-11-26 ENCOUNTER — Ambulatory Visit: Payer: Self-pay

## 2015-12-21 ENCOUNTER — Ambulatory Visit (HOSPITAL_COMMUNITY)
Admission: EM | Admit: 2015-12-21 | Discharge: 2015-12-21 | Disposition: A | Discharge status: Home / Self Care | Payer: Commercial Managed Care - HMO | Attending: Family Medicine | Admitting: Family Medicine

## 2015-12-21 ENCOUNTER — Encounter (HOSPITAL_COMMUNITY): Payer: Self-pay | Admitting: *Deleted

## 2015-12-21 DIAGNOSIS — M7522 Bicipital tendinitis, left shoulder: Secondary | ICD-10-CM | POA: Diagnosis not present

## 2015-12-21 MED ORDER — DICLOFENAC SODIUM 1 % TD GEL: 4.0000 g | Freq: Four times a day (QID) | TRANSDERMAL | 1 refills | Status: DC

## 2015-12-21 NOTE — ED Provider Notes (Signed)
[facility]    CSN: [id number] Arrival date & time: 12/21/15  1204  First Provider Contact:  First MD Initiated Contact with Patient 12/21/15 1253        History   Chief Complaint Chief Complaint  Patient presents with  . Arm Problem    HPI Caleb Garcia is a 50 y.o. male.   The history is provided by the patient.  Arm Injury  Location:  Shoulder Shoulder location:  L shoulder Injury: yes   Time since incident:  2 days Mechanism of injury comment:  Moving heavy crates at work and began having arm pain. Pain details:    Quality:  Sharp   Radiates to:  L upper arm   Severity:  Moderate   Onset quality:  Sudden   Duration:  2 days   Progression:  Unchanged Dislocation: no   Foreign body present:  No foreign bodies Prior injury to area:  No Relieved by:  None tried Worsened by:  Nothing Ineffective treatments:  None tried Associated symptoms: no back pain     Past Medical History:  Diagnosis Date  . Cardiomyopathy    Non-ischemic  . CHF (congestive heart failure) (HCC)   . Diabetes mellitus without complication (HCC)   . Glucose intolerance (impaired glucose tolerance)   . HTN (hypertension)   . Hyperlipidemia   . Hypokalemia   . Obesity   . OSA (obstructive sleep apnea)   . Pre-diabetes   . Systolic CHF [facility])     Patient Active Problem List   Diagnosis Date Noted  . Controlled type 2 diabetes mellitus without complication (HCC) 11/26/2013  . Bilateral shoulder pain 10/31/2013  . Breast tenderness in male 10/31/2013  . Erectile dysfunction 04/18/2013  . Mixed hyperlipidemia 11/26/2009  . LIMB PAIN 07/23/2009  . GERD 04/29/2009  . CVA 10/23/2008  . HYPOKALEMIA 10/22/2008  . OBESITY 10/22/2008  . OBSTRUCTIVE SLEEP APNEA 10/22/2008  . Essential hypertension 10/22/2008  . CARDIOMYOPATHY 10/22/2008  . CONGESTIVE HEART FAILURE, MILD 10/22/2008  . Chronic systolic heart failure (HCC) 10/16/2008    Past Surgical History:  Procedure  Laterality Date  . CARDIAC CATHETERIZATION  10/08/08  . TONSILLECTOMY    . UVULOPALATOPHARYNGOPLASTY         Home Medications    Prior to Admission medications   Medication Sig Start Date End Date Taking? Authorizing Provider  aspirin 81 MG tablet Take 81 mg by mouth daily.      Historical Provider, MD  atorvastatin (LIPITOR) 20 MG tablet Take 1 tablet (20 mg total) by mouth daily. 11/26/13   Doe-Hyun R Artist Pais, DO  carvedilol (COREG) 25 MG tablet TAKE ONE TABLET BY MOUTH TWICE DAILY WITH A MEAL 07/10/15   Dolores Patty, MD  felodipine (PLENDIL) 2.5 MG 24 hr tablet TAKE ONE TABLET BY MOUTH ONCE DAILY 10/02/15   Shirline Frees, NP  furosemide (LASIX) 40 MG tablet TAKE ONE TABLET BY MOUTH ONCE DAILY 10/02/15   Shirline Frees, NP  hydrALAZINE (APRESOLINE) 25 MG tablet TAKE ONE TABLET BY MOUTH THREE TIMES DAILY 08/21/15   Dolores Patty, MD  indomethacin (INDOCIN) 50 MG capsule Take 1 capsule (50 mg total) by mouth 3 (three) times daily as needed for mild pain. Patient not taking: Reported on 03/21/2015 11/29/14   Raelyn Ensign, PA  lisinopril (PRINIVIL,ZESTRIL) 40 MG tablet Take 1 tablet (40 mg total) by mouth daily. 12/04/14   Shirline Frees, NP  lisinopril (PRINIVIL,ZESTRIL) 40 MG tablet TAKE ONE TABLET BY MOUTH ONCE  DAILY 10/01/15   Dolores Patty, MD  metFORMIN (GLUCOPHAGE) 500 MG tablet Take 1 tablet (500 mg total) by mouth 2 (two) times daily with a meal. 12/04/14   Shirline Frees, NP  potassium chloride (K-DUR) 10 MEQ tablet TAKE ONE TABLET BY MOUTH ONCE DAILY 10/02/15   Shirline Frees, NP  spironolactone (ALDACTONE) 25 MG tablet Take 2 tablets (50 mg total) by mouth daily. 12/04/14   Shirline Frees, NP  tadalafil (CIALIS) 20 MG tablet Take 1 tablet (20 mg total) by mouth daily as needed for erectile dysfunction. Patient not taking: Reported on 03/21/2015 12/04/14   Shirline Frees, NP    Family History Family History  Problem Relation Age of Onset  . Diabetes Mother   . Hypertension Mother     . Heart disease Mother   . Cancer Father     Throat cancer  . Stroke Father   . Prostate cancer Neg Hx   . Colon cancer Neg Hx     Social History Social History  Substance Use Topics  . Smoking status: Never Smoker  . Smokeless tobacco: Not on file  . Alcohol use No     Allergies   Shrimp [shellfish allergy]   Review of Systems Review of Systems  Constitutional: Negative.   Musculoskeletal: Negative for back pain, gait problem and joint swelling.  All other systems reviewed and are negative.    Physical Exam Triage Vital Signs ED Triage Vitals [12/21/15 1245]  Enc Vitals Group     BP 150/91     Pulse Rate 83     Resp 16     Temp 98.2 F (36.8 C)     Temp Source Oral     SpO2 99 %     Weight      Height      Head Circumference      Peak Flow      Pain Score      Pain Loc      Pain Edu?      Excl. in GC?    No data found.   Updated Vital Signs BP 150/91 (BP Location: Right Arm)   Pulse 83   Temp 98.2 F (36.8 C) (Oral)   Resp 16   SpO2 99%   Visual Acuity Right Eye Distance:   Left Eye Distance:   Bilateral Distance:    Right Eye Near:   Left Eye Near:    Bilateral Near:     Physical Exam  Constitutional: He is oriented to person, place, and time. He appears well-developed and well-nourished.  Musculoskeletal: He exhibits tenderness.       Left shoulder: He exhibits decreased range of motion, tenderness, pain and decreased strength. He exhibits no swelling, no effusion, no crepitus, no deformity and normal pulse.       Arms: Neurological: He is alert and oriented to person, place, and time.  Skin: Skin is warm and dry.  Nursing note and vitals reviewed.    UC Treatments / Results  Labs (all labs ordered are listed, but only abnormal results are displayed) Labs Reviewed - No data to display  EKG  EKG Interpretation None       Radiology No results found.  Procedures Procedures (including critical care time)  Medications  Ordered in UC Medications - No data to display   Initial Impression / Assessment and Plan / UC Course  I have reviewed the triage vital signs and the nursing notes.  Pertinent labs & imaging results  that were available during my care of the patient were reviewed by me and considered in my medical decision making (see chart for details).  Clinical Course      Final Clinical Impressions(s) / UC Diagnoses   Final diagnoses:  None    New Prescriptions New Prescriptions   No medications on file     Linna HoffJames D Joelynn Dust, MD 12/21/15 1313

## 2015-12-21 NOTE — ED Notes (Signed)
Called patient in the lobby, no answer.

## 2015-12-21 NOTE — ED Triage Notes (Signed)
Pt  Reports  l  Arm  Pain  -    Pt  Reports  He  Was   Moving    Heavy  Crates     -   He  Also   Reports    He  Is  Out of  His  meds            Has  Not tyaken  Any  In over  1  month

## 2015-12-21 NOTE — Discharge Instructions (Signed)
Ice pack, sling and medicine as needed., see your doctor for medicine refill.

## 2015-12-23 ENCOUNTER — Emergency Department (HOSPITAL_BASED_OUTPATIENT_CLINIC_OR_DEPARTMENT_OTHER): Payer: Worker's Compensation

## 2015-12-23 ENCOUNTER — Emergency Department (HOSPITAL_BASED_OUTPATIENT_CLINIC_OR_DEPARTMENT_OTHER)
Admission: EM | Admit: 2015-12-23 | Discharge: 2015-12-24 | Disposition: A | Discharge status: Home / Self Care | Payer: Worker's Compensation | Attending: Emergency Medicine | Admitting: Emergency Medicine

## 2015-12-23 ENCOUNTER — Encounter (HOSPITAL_BASED_OUTPATIENT_CLINIC_OR_DEPARTMENT_OTHER): Payer: Self-pay

## 2015-12-23 DIAGNOSIS — W11XXXA Fall on and from ladder, initial encounter: Secondary | ICD-10-CM | POA: Insufficient documentation

## 2015-12-23 DIAGNOSIS — S8390XA Sprain of unspecified site of unspecified knee, initial encounter: Secondary | ICD-10-CM

## 2015-12-23 DIAGNOSIS — S72002A Fracture of unspecified part of neck of left femur, initial encounter for closed fracture: Secondary | ICD-10-CM

## 2015-12-23 DIAGNOSIS — Z79899 Other long term (current) drug therapy: Secondary | ICD-10-CM | POA: Diagnosis not present

## 2015-12-23 DIAGNOSIS — Z7982 Long term (current) use of aspirin: Secondary | ICD-10-CM | POA: Diagnosis not present

## 2015-12-23 DIAGNOSIS — Y990 Civilian activity done for income or pay: Secondary | ICD-10-CM | POA: Insufficient documentation

## 2015-12-23 DIAGNOSIS — E119 Type 2 diabetes mellitus without complications: Secondary | ICD-10-CM | POA: Insufficient documentation

## 2015-12-23 DIAGNOSIS — S8392XA Sprain of unspecified site of left knee, initial encounter: Secondary | ICD-10-CM | POA: Diagnosis not present

## 2015-12-23 DIAGNOSIS — Z7984 Long term (current) use of oral hypoglycemic drugs: Secondary | ICD-10-CM | POA: Insufficient documentation

## 2015-12-23 DIAGNOSIS — S32311A Displaced avulsion fracture of right ilium, initial encounter for closed fracture: Secondary | ICD-10-CM | POA: Insufficient documentation

## 2015-12-23 DIAGNOSIS — Y939 Activity, unspecified: Secondary | ICD-10-CM | POA: Insufficient documentation

## 2015-12-23 DIAGNOSIS — I5021 Acute systolic (congestive) heart failure: Secondary | ICD-10-CM | POA: Diagnosis not present

## 2015-12-23 DIAGNOSIS — Y929 Unspecified place or not applicable: Secondary | ICD-10-CM | POA: Insufficient documentation

## 2015-12-23 DIAGNOSIS — S79912A Unspecified injury of left hip, initial encounter: Secondary | ICD-10-CM | POA: Diagnosis present

## 2015-12-23 DIAGNOSIS — I110 Hypertensive heart disease with heart failure: Secondary | ICD-10-CM | POA: Insufficient documentation

## 2015-12-23 LAB — RAPID URINE DRUG SCREEN, HOSP PERFORMED
AMPHETAMINES: NOT DETECTED
BARBITURATES: NOT DETECTED
Benzodiazepines: NOT DETECTED
COCAINE: NOT DETECTED
OPIATES: NOT DETECTED
TETRAHYDROCANNABINOL: NOT DETECTED

## 2015-12-23 LAB — URINALYSIS, ROUTINE W REFLEX MICROSCOPIC
Bilirubin Urine: NEGATIVE
Glucose, UA: NEGATIVE mg/dL
Hgb urine dipstick: NEGATIVE
Ketones, ur: NEGATIVE mg/dL
Leukocytes, UA: NEGATIVE
NITRITE: NEGATIVE
PH: 6 (ref 5.0–8.0)
Protein, ur: NEGATIVE mg/dL
SPECIFIC GRAVITY, URINE: 1.029 (ref 1.005–1.030)

## 2015-12-23 MED ORDER — IBUPROFEN 400 MG PO TABS
600.0000 mg | ORAL_TABLET | Freq: Once | ORAL | Status: AC
  Administered 2015-12-23: 600 mg via ORAL
  Filled 2015-12-23: qty 1

## 2015-12-23 MED ORDER — HYDROCODONE-ACETAMINOPHEN 5-325 MG PO TABS
1.0000 | ORAL_TABLET | Freq: Once | ORAL | Status: AC
  Administered 2015-12-23: 1 via ORAL
  Filled 2015-12-23: qty 1

## 2015-12-23 NOTE — ED Provider Notes (Signed)
[facility]   CSN: [id number] Arrival date & time: 12/23/15  2003  By signing my name below, I, Modena Jansky, attest that this documentation has been prepared under the direction and in the presence of Derwood Kaplan, MD . Electronically Signed: Modena Jansky, Scribe. 12/23/2015. 10:14 PM.  History   Chief Complaint Chief Complaint  Patient presents with  . Fall   The history is provided by the patient. No language interpreter was used.   HPI Comments: Caleb Garcia is a 50 y.o. male who presents to the Emergency Department complaining of a fall that occurred about 5 hours ago. Pt states he fell backwards 3 ft off a step ladder onto a steel grate at work. He landed on the left side of his back and hit his head. Denies LOC. Reports constant moderate pain to his left lower back, left hip, and LLE. States currently being on bayer aspirin. Denies any unilateral weakness, difficulty ambulating, or other complaints. Pt has a mild headache and back pain. No associated numbness, weakness, urinary incontinence, seizures, LOC, confusion.    Past Medical History:  Diagnosis Date  . Cardiomyopathy    Non-ischemic  . CHF (congestive heart failure) (HCC)   . Diabetes mellitus without complication (HCC)   . Glucose intolerance (impaired glucose tolerance)   . HTN (hypertension)   . Hyperlipidemia   . Hypokalemia   . Obesity   . OSA (obstructive sleep apnea)   . Pre-diabetes   . Systolic CHF [facility])     Patient Active Problem List   Diagnosis Date Noted  . Controlled type 2 diabetes mellitus without complication (HCC) 11/26/2013  . Bilateral shoulder pain 10/31/2013  . Breast tenderness in male 10/31/2013  . Erectile dysfunction 04/18/2013  . Mixed hyperlipidemia 11/26/2009  . LIMB PAIN 07/23/2009  . GERD 04/29/2009  . CVA 10/23/2008  . HYPOKALEMIA 10/22/2008  . OBESITY 10/22/2008  . OBSTRUCTIVE SLEEP APNEA 10/22/2008  . Essential hypertension 10/22/2008    . CARDIOMYOPATHY 10/22/2008  . CONGESTIVE HEART FAILURE, MILD 10/22/2008  . Chronic systolic heart failure (HCC) 10/16/2008    Past Surgical History:  Procedure Laterality Date  . CARDIAC CATHETERIZATION  10/08/08  . TONSILLECTOMY    . UVULOPALATOPHARYNGOPLASTY         Home Medications    Prior to Admission medications   Medication Sig Start Date End Date Taking? Authorizing Provider  aspirin 81 MG tablet Take 81 mg by mouth daily.      Historical Provider, MD  atorvastatin (LIPITOR) 20 MG tablet Take 1 tablet (20 mg total) by mouth daily. 11/26/13   Doe-Hyun R Artist Pais, DO  carvedilol (COREG) 25 MG tablet TAKE ONE TABLET BY MOUTH TWICE DAILY WITH A MEAL 07/10/15   Dolores Patty, MD  diclofenac sodium (VOLTAREN) 1 % GEL Apply 4 g topically 4 (four) times daily. To shoulder 12/21/15   Linna Hoff, MD  felodipine (PLENDIL) 2.5 MG 24 hr tablet TAKE ONE TABLET BY MOUTH ONCE DAILY 10/02/15   Shirline Frees, NP  furosemide (LASIX) 40 MG tablet TAKE ONE TABLET BY MOUTH ONCE DAILY 10/02/15   Shirline Frees, NP  hydrALAZINE (APRESOLINE) 25 MG tablet TAKE ONE TABLET BY MOUTH THREE TIMES DAILY 08/21/15   Dolores Patty, MD  HYDROcodone-acetaminophen (NORCO/VICODIN) 5-325 MG tablet Take 1 tablet by mouth every 6 (six) hours as needed. 12/24/15   Derwood Kaplan, MD  ibuprofen (ADVIL,MOTRIN) 600 MG tablet Take 1 tablet (600 mg total) by mouth every 6 (six)  hours as needed. 12/24/15   Derwood KaplanAnkit Guerline Happ, MD  lisinopril (PRINIVIL,ZESTRIL) 40 MG tablet TAKE ONE TABLET BY MOUTH ONCE DAILY 10/01/15   Dolores Pattyaniel R Bensimhon, MD  metFORMIN (GLUCOPHAGE) 500 MG tablet Take 1 tablet (500 mg total) by mouth 2 (two) times daily with a meal. 12/04/14   Shirline Freesory Nafziger, NP  potassium chloride (K-DUR) 10 MEQ tablet TAKE ONE TABLET BY MOUTH ONCE DAILY 10/02/15   Shirline Freesory Nafziger, NP  spironolactone (ALDACTONE) 25 MG tablet Take 2 tablets (50 mg total) by mouth daily. 12/04/14   Shirline Freesory Nafziger, NP  tadalafil (CIALIS) 20 MG tablet Take 1  tablet (20 mg total) by mouth daily as needed for erectile dysfunction. Patient not taking: Reported on 03/21/2015 12/04/14   Shirline Freesory Nafziger, NP    Family History Family History  Problem Relation Age of Onset  . Diabetes Mother   . Hypertension Mother   . Heart disease Mother   . Cancer Father     Throat cancer  . Stroke Father   . Prostate cancer Neg Hx   . Colon cancer Neg Hx     Social History Social History  Substance Use Topics  . Smoking status: Never Smoker  . Smokeless tobacco: Never Used  . Alcohol use No     Allergies   Shrimp [shellfish allergy]   Review of Systems Review of Systems  Constitutional: Negative for fever.  Cardiovascular: Negative for chest pain.  Musculoskeletal: Positive for back pain.  Neurological: Negative for syncope and weakness.  Hematological: Bruises/bleeds easily.     Physical Exam Updated Vital Signs BP 145/97 (BP Location: Left Arm)   Pulse 83   Temp 98.5 F (36.9 C) (Oral)   Resp 18   Ht 5\' 10"  (1.778 m)   Wt 250 lb (113.4 kg)   SpO2 100%   BMI 35.87 kg/m   Physical Exam  Constitutional: He appears well-developed and well-nourished. No distress.  HENT:  Head: Normocephalic.  Right Ear: External ear normal.  Left Ear: External ear normal.  No scalp hematoma with no bleeding.   Eyes: Conjunctivae are normal. Right eye exhibits no discharge. Left eye exhibits no discharge. No scleral icterus.  Neck: Neck supple. No tracheal deviation present.  No midline c-spine tenderness, pt able to turn head to 45 degrees bilaterally without any pain and able to flex neck to the chest and extend without any pain or neurologic symptoms.   Cardiovascular: Normal rate.   Pulmonary/Chest: Effort normal. No stridor. No respiratory distress.  Abdominal: He exhibits no distension.  Musculoskeletal: He exhibits tenderness. He exhibits no edema.  Left knee effusion with TTP on the entire medial aspect. Tenderness with valgus stress of the  knee.  Posterior and anterior drawer test are negative.  TTP over the left hip posteriorly.  TTP over the lower thoracic and lumbar diffusely.   OTHERWISE: Head to toe evaluation shows no hematoma, bleeding of the scalp, no facial abrasions, step offs, crepitus, no tenderness to palpation of the bilateral upper and lower extremities, no gross deformities, no chest tenderness, no pelvic pain.  Neurological: He is alert. Cranial nerve deficit: no gross deficits.  +2 equal patellar reflexes. Gross exam of LE is normal.   Skin: Skin is warm and dry. No rash noted.  Psychiatric: He has a normal mood and affect.  Nursing note and vitals reviewed.    ED Treatments / Results  DIAGNOSTIC STUDIES: Oxygen Saturation is 100% on RA, normal by my interpretation.    COORDINATION OF CARE:  10:19 PM- Pt advised of plan for treatment and pt agrees.  Labs (all labs ordered are listed, but only abnormal results are displayed) Labs Reviewed  URINALYSIS, ROUTINE W REFLEX MICROSCOPIC (NOT AT [facility])  URINE RAPID DRUG SCREEN, HOSP PERFORMED    EKG  EKG Interpretation None       Radiology Dg Thoracic Spine 2 View  Result Date: 12/23/2015 CLINICAL DATA:  Larey Seat from a truck 6 hours ago. Pain throughout the entire back. EXAM: THORACIC SPINE 2 VIEWS COMPARISON:  None. FINDINGS: A single swimmer's view of the cervical thoracic junction is obtained. Evaluation is limited due to soft tissue attenuation. Normal alignment of the visualized lower cervical and upper thoracic spine. No vertebral compression deformities. Degenerative changes are present with endplate hypertrophic changes seen. IMPRESSION: Limited lateral swimmer's view of the upper thoracic spine demonstrating normal alignment of the visualized cervical and thoracic vertebrae. Electronically Signed   By: Burman Nieves M.D.   On: 12/23/2015 23:24   Dg Lumbar Spine Complete  Result Date: 12/23/2015 CLINICAL DATA:  Status post fall from truck, with  left lateral lower back pain. Initial encounter. EXAM: LUMBAR SPINE - COMPLETE 4+ VIEW COMPARISON:  Lumbar spine radiographs performed 08/03/2010 FINDINGS: There is no evidence of fracture or subluxation. Small anterior osteophytes are noted along the lumbar spine. Vertebral bodies demonstrate normal height and alignment. Intervertebral disc spaces are preserved. The visualized neural foramina are grossly unremarkable in appearance. The visualized bowel gas pattern is unremarkable in appearance; air and stool are noted within the colon. The sacroiliac joints are within normal limits. IMPRESSION: No evidence of fracture or subluxation along the lumbar spine. Electronically Signed   By: Roanna Raider M.D.   On: 12/23/2015 23:25   Dg Knee Complete 4 Views Left  Result Date: 12/23/2015 CLINICAL DATA:  Larey Seat from truck 6 hours ago. Left anterior knee pain. EXAM: LEFT KNEE - COMPLETE 4+ VIEW COMPARISON:  11/29/2014 FINDINGS: Degenerative changes in the left knee with medial greater than lateral compartment narrowing and tricompartment osteophyte formation. Loose body demonstrated in the suprapatellar region. No significant effusion. No evidence of acute fracture or dislocation. No significant changes since previous study. IMPRESSION: Mild tricompartment degenerative changes in the left knee. Loose body in the suprapatellar region. No acute fractures. Electronically Signed   By: Burman Nieves M.D.   On: 12/23/2015 23:27   Dg Hip Unilat W Or Wo Pelvis 2-3 Views Left  Result Date: 12/23/2015 CLINICAL DATA:  Larey Seat from truck 6 hours ago. Left lateral pelvic pain. EXAM: DG HIP (WITH OR WITHOUT PELVIS) 2-3V LEFT COMPARISON:  None. FINDINGS: Degenerative changes in the lower lumbar spine and in both hips. There is a small osseous fragment projecting from the lesser trochanter of the left hip consistent with avulsion fracture. Pelvis and left hip appear otherwise intact. No displaced fractures demonstrated. SI joints  and symphysis pubis are not displaced. IMPRESSION: Small osseous fragment projecting from the lesser trochanter of the left hip suggesting an avulsion fracture. Electronically Signed   By: Burman Nieves M.D.   On: 12/23/2015 23:26    Procedures Procedures (including critical care time)  Medications Ordered in ED Medications  HYDROcodone-acetaminophen (NORCO/VICODIN) 5-325 MG per tablet 1 tablet (1 tablet Oral Given 12/23/15 2236)  ibuprofen (ADVIL,MOTRIN) tablet 600 mg (600 mg Oral Given 12/23/15 2236)     Initial Impression / Assessment and Plan / ED Course  I have reviewed the triage vital signs and the nursing notes.  Pertinent labs & imaging results  that were available during my care of the patient were reviewed by me and considered in my medical decision making (see chart for details).  Clinical Course  Comment By Time  Xrays results discussed. Pt will get crutches. Avulsion fx on xrays - which is possible from the fall. We will give Derwood Kaplan, MD 09/20 0024    I personally performed the services described in this documentation, which was scribed in my presence. The recorded information has been reviewed and is accurate.    Final Clinical Impressions(s) / ED Diagnoses   Final diagnoses:  Avulsion fracture of hip, left, closed, initial encounter [facility])  Knee sprain, unspecified laterality, initial encounter    New Prescriptions Discharge Medication List as of 12/24/2015 12:16 AM     I personally performed the services described in this documentation, which was scribed in my presence. The recorded information has been reviewed and is accurate.  Pt comes in with cc of fall.  DDx includes: - Mechanical falls - ICH - Fractures - Contusions - Soft tissue injury  Pt comes in post fall.  Fall occurred at 5:30 - we will clear his head clinically. Cspine cleared clinically neck.  Pt has focal tenderness to the R hip, knee. Generalized back pain appreciated.  Appropriate imaging ordered. There is no severe bruise. Abd exam is fine, doubt intra-abdominal bleeding. Pain meds and ice  Ordered.   Derwood Kaplan, MD 12/24/15 [phone]

## 2015-12-23 NOTE — ED Notes (Signed)
Patient transported to radiology

## 2015-12-23 NOTE — ED Notes (Signed)
MD at bedside. 

## 2015-12-23 NOTE — ED Triage Notes (Signed)
Pt states he fell out or work truck today approx 530pm-pain to "my whole left side"-NAD-walking with slight limping gait

## 2015-12-24 MED ORDER — IBUPROFEN 600 MG PO TABS: 600.0000 mg | ORAL_TABLET | Freq: Four times a day (QID) | ORAL | 0 refills | Status: DC | PRN

## 2015-12-24 MED ORDER — HYDROCODONE-ACETAMINOPHEN 5-325 MG PO TABS: 1.0000 | ORAL_TABLET | Freq: Four times a day (QID) | ORAL | 0 refills | Status: DC | PRN

## 2015-12-24 NOTE — Discharge Instructions (Signed)
There is a small avulsion fracture of the hip on the R side and likely a knee sprain. See the othopedic doctor as requested.

## 2015-12-30 ENCOUNTER — Ambulatory Visit (HOSPITAL_COMMUNITY)
Admission: EM | Admit: 2015-12-30 | Discharge: 2015-12-30 | Discharge status: Absent without leave | Payer: Commercial Managed Care - HMO

## 2016-01-01 ENCOUNTER — Encounter: Payer: Self-pay | Admitting: Adult Health

## 2016-01-01 ENCOUNTER — Ambulatory Visit (INDEPENDENT_AMBULATORY_CARE_PROVIDER_SITE_OTHER): Payer: 59 | Admitting: Adult Health

## 2016-01-01 VITALS — BP 166/84 | Temp 98.8°F | Ht 70.0 in | Wt 256.6 lb

## 2016-01-01 DIAGNOSIS — I5022 Chronic systolic (congestive) heart failure: Secondary | ICD-10-CM

## 2016-01-01 DIAGNOSIS — Z1211 Encounter for screening for malignant neoplasm of colon: Secondary | ICD-10-CM

## 2016-01-01 DIAGNOSIS — E119 Type 2 diabetes mellitus without complications: Secondary | ICD-10-CM

## 2016-01-01 DIAGNOSIS — Z760 Encounter for issue of repeat prescription: Secondary | ICD-10-CM | POA: Diagnosis not present

## 2016-01-01 DIAGNOSIS — I10 Essential (primary) hypertension: Secondary | ICD-10-CM

## 2016-01-01 MED ORDER — METFORMIN HCL 500 MG PO TABS: 500.0000 mg | ORAL_TABLET | Freq: Two times a day (BID) | ORAL | 0 refills | Status: DC

## 2016-01-01 MED ORDER — LISINOPRIL 40 MG PO TABS: 40.0000 mg | ORAL_TABLET | Freq: Every day | ORAL | 1 refills | Status: DC

## 2016-01-01 MED ORDER — ATORVASTATIN CALCIUM 20 MG PO TABS: 20.0000 mg | ORAL_TABLET | Freq: Every day | ORAL | 3 refills | Status: DC

## 2016-01-01 MED ORDER — SPIRONOLACTONE 25 MG PO TABS: 50.0000 mg | ORAL_TABLET | Freq: Every day | ORAL | 6 refills | Status: DC

## 2016-01-01 MED ORDER — FELODIPINE ER 2.5 MG PO TB24: 2.5000 mg | ORAL_TABLET | Freq: Every day | ORAL | 0 refills | Status: DC

## 2016-01-01 MED ORDER — POTASSIUM CHLORIDE ER 10 MEQ PO TBCR: 10.0000 meq | EXTENDED_RELEASE_TABLET | Freq: Every day | ORAL | 0 refills | Status: DC

## 2016-01-01 NOTE — Progress Notes (Signed)
Patient presents to clinic today to establish care. 50 year old male who  has a past medical history of Cardiomyopathy; CHF (congestive heart failure) (HCC); Diabetes mellitus without complication (HCC); HTN (hypertension); Hyperlipidemia; Hypokalemia; Obesity; OSA (obstructive sleep apnea); and Systolic CHF (HCC).   His last physical was in July 2016 with Dr. Artist Pais.   He reports that he lost his job which caused him to lose his medical insurance. He's been without most of his medications for approximately one month. Currently has a new job with medical insurance.    Acute Concerns: Establish Care  Chronic Issues: DM - Not been taking metformin as prescribed due to running out of medication. He does not check his blood sugars at home despite having a meter and testing strips  Hypertension - Medication he's been taking for her hypertension has been Coreg 25 mg. He had run out of all the rest of his hypertension medications. Blood pressure in the office today is 166/84. Today is the first day that he reports having a headache.   Chronic systolic heart failure - Has not follow-up with cardiology in some time. He understands that he needs to he will make an appointment. He feels as though his heart failure is well controlled currently. He denies any shortness of breath, cough, chest pain    Health Maintenance: Dental -- Does not do routine care Vision -- Yearly  Immunizations -- UTD  Colonoscopy -- He is due  Diet: He does not follow a specific diet. Tries to eat healthy but often falls of the wagon  Exercise: Does not routinely exercise   He is followed by:  Cardiology    Past Medical History:  Diagnosis Date  . Cardiomyopathy    Non-ischemic  . CHF (congestive heart failure) (HCC)   . Diabetes mellitus without complication (HCC)   . HTN (hypertension)   . Hyperlipidemia   . Hypokalemia   . Obesity   . OSA (obstructive sleep apnea)   . Systolic CHF [facility])     Past  Surgical History:  Procedure Laterality Date  . avulsion fracture of left hip    . CARDIAC CATHETERIZATION  10/08/08  . TONSILLECTOMY    . UVULOPALATOPHARYNGOPLASTY      Current Outpatient Prescriptions on File Prior to Visit  Medication Sig Dispense Refill  . aspirin 81 MG tablet Take 81 mg by mouth daily.      . carvedilol (COREG) 25 MG tablet TAKE ONE TABLET BY MOUTH TWICE DAILY WITH A MEAL 60 tablet 3  . diclofenac sodium (VOLTAREN) 1 % GEL Apply 4 g topically 4 (four) times daily. To shoulder 1 Tube 1  . furosemide (LASIX) 40 MG tablet TAKE ONE TABLET BY MOUTH ONCE DAILY 30 tablet 0  . hydrALAZINE (APRESOLINE) 25 MG tablet TAKE ONE TABLET BY MOUTH THREE TIMES DAILY 90 tablet 0  . HYDROcodone-acetaminophen (NORCO/VICODIN) 5-325 MG tablet Take 1 tablet by mouth every 6 (six) hours as needed. 12 tablet 0  . ibuprofen (ADVIL,MOTRIN) 600 MG tablet Take 1 tablet (600 mg total) by mouth every 6 (six) hours as needed. 30 tablet 0  . tadalafil (CIALIS) 20 MG tablet Take 1 tablet (20 mg total) by mouth daily as needed for erectile dysfunction. 10 tablet 3   No current facility-administered medications on file prior to visit.     Allergies  Allergen Reactions  . Shrimp [Shellfish Allergy]     Family History  Problem Relation Age of Onset  . Diabetes Mother   .  Hypertension Mother   . Heart disease Mother   . Cancer Father     Throat cancer  . Stroke Father   . Prostate cancer Neg Hx   . Colon cancer Neg Hx     Social History   Social History  . Marital status: Married    Spouse name: N/A  . Number of children: N/A  . Years of education: N/A   Occupational History  .  Unemployed   Social History Main Topics  . Smoking status: Never Smoker  . Smokeless tobacco: Never Used  . Alcohol use No  . Drug use: No  . Sexual activity: Not on file   Other Topics Concern  . Not on file   Social History Narrative   Married 6 years   2 biologic daughters   2 stepsons, one  Programme researcher, broadcasting/film/video for [employer]     Review of Systems  Constitutional: Negative.   HENT: Negative.   Eyes: Negative.   Respiratory: Negative.   Cardiovascular: Negative.   Gastrointestinal: Negative.   Genitourinary: Negative.   Musculoskeletal: Negative.   Skin: Negative.   Neurological:       Headache  Endo/Heme/Allergies: Negative.   Psychiatric/Behavioral: Negative.   All other systems reviewed and are negative.   BP (!) 166/84   Temp 98.8 F (37.1 C) (Oral)   Ht 5\' 10"  (1.778 m)   Wt 256 lb 9.6 oz (116.4 kg)   BMI 36.82 kg/m   Physical Exam  Constitutional: He is oriented to person, place, and time and well-developed, well-nourished, and in no distress. No distress.  HENT:  Head: Normocephalic and atraumatic.  Right Ear: External ear normal.  Left Ear: External ear normal.  Nose: Nose normal.  Mouth/Throat: Oropharynx is clear and moist. No oropharyngeal exudate.  Eyes: Conjunctivae and EOM are normal. Pupils are equal, round, and reactive to light. Right eye exhibits no discharge. Left eye exhibits no discharge.  Cardiovascular: Normal rate, regular rhythm, normal heart sounds and intact distal pulses.  Exam reveals no gallop and no friction rub.   No murmur heard. Pulmonary/Chest: Effort normal and breath sounds normal. No respiratory distress. He has no wheezes. He has no rales. He exhibits no tenderness.  Musculoskeletal: Normal range of motion. He exhibits no edema, tenderness or deformity.  Neurological: He is alert and oriented to person, place, and time. He has normal reflexes. Gait normal. GCS score is 15.  Skin: Skin is warm and dry. No rash noted. He is not diaphoretic. No erythema. No pallor.  Psychiatric: Mood, memory, affect and judgment normal.  Nursing note and vitals reviewed.   Recent Results (from the past 2160 hour(s))  Urinalysis, Routine w reflex microscopic     Status: None   Collection Time: 12/23/15 10:33 PM  Result Value Ref Range     Color, Urine YELLOW YELLOW   APPearance CLEAR CLEAR   Specific Gravity, Urine 1.029 1.005 - 1.030   pH 6.0 5.0 - 8.0   Glucose, UA NEGATIVE NEGATIVE mg/dL   Hgb urine dipstick NEGATIVE NEGATIVE   Bilirubin Urine NEGATIVE NEGATIVE   Ketones, ur NEGATIVE NEGATIVE mg/dL   Protein, ur NEGATIVE NEGATIVE mg/dL   Nitrite NEGATIVE NEGATIVE   Leukocytes, UA NEGATIVE NEGATIVE    Comment: MICROSCOPIC NOT DONE ON URINES WITH NEGATIVE PROTEIN, BLOOD, LEUKOCYTES, NITRITE, OR GLUCOSE <1000 mg/dL.  Urine rapid drug screen (hosp performed)     Status: None   Collection Time: 12/23/15 10:33 PM  Result Value Ref  Range   Opiates NONE DETECTED NONE DETECTED   Cocaine NONE DETECTED NONE DETECTED   Benzodiazepines NONE DETECTED NONE DETECTED   Amphetamines NONE DETECTED NONE DETECTED   Tetrahydrocannabinol NONE DETECTED NONE DETECTED   Barbiturates NONE DETECTED NONE DETECTED    Comment:        DRUG SCREEN FOR MEDICAL PURPOSES ONLY.  IF CONFIRMATION IS NEEDED FOR ANY PURPOSE, NOTIFY LAB WITHIN 5 DAYS.        LOWEST DETECTABLE LIMITS FOR URINE DRUG SCREEN Drug Class       Cutoff (ng/mL) Amphetamine      1000 Barbiturate      200 Benzodiazepine   200 Tricyclics       300 Opiates          300 Cocaine          300 THC              50     Assessment/Plan: 1. Essential hypertension - Not controlled currently. He is records show that his current medication therapy has him well controlled. Advised to start taking his antihypertension medications when he gets them this evening - Monitor blood pressure at home - Follow-up with up physical. Bring blood pressure cuff and log to next visit - Return precautions given 2. Controlled type 2 diabetes mellitus without complication, without long-term current use of insulin (HCC) - Start monitoring his blood sugars at home.] - metFORMIN (GLUCOPHAGE) 500 MG tablet; Take 1 tablet (500 mg total) by mouth 2 (two) times daily with a meal.  Dispense: 120 tablet;  Refill: 0 - We'll follow-up with A1c at his physical 3. Medication refill  - lisinopril (PRINIVIL,ZESTRIL) 40 MG tablet; Take 1 tablet (40 mg total) by mouth daily.  Dispense: 90 tablet; Refill: 1 - Ambulatory referral to Gastroenterology - metFORMIN (GLUCOPHAGE) 500 MG tablet; Take 1 tablet (500 mg total) by mouth 2 (two) times daily with a meal.  Dispense: 120 tablet; Refill: 0 - felodipine (PLENDIL) 2.5 MG 24 hr tablet; Take 1 tablet (2.5 mg total) by mouth daily.  Dispense: 90 tablet; Refill: 0 - atorvastatin (LIPITOR) 20 MG tablet; Take 1 tablet (20 mg total) by mouth daily.  Dispense: 90 tablet; Refill: 3 - potassium chloride (K-DUR) 10 MEQ tablet; Take 1 tablet (10 mEq total) by mouth daily.  Dispense: 90 tablet; Refill: 0 - spironolactone (ALDACTONE) 25 MG tablet; Take 2 tablets (50 mg total) by mouth daily.  Dispense: 60 tablet; Refill: 6  4. Chronic systolic heart failure (HCC) - Follow-up with cardiology - spironolactone (ALDACTONE) 25 MG tablet; Take 2 tablets (50 mg total) by mouth daily.  Dispense: 60 tablet; Refill: 6  5. Colon cancer screening  - Ambulatory referral to Gastroenterology  Shirline Freesory Larah Kuntzman, NP

## 2016-01-01 NOTE — Patient Instructions (Signed)
It was great seeing you again!  Please follow up on Tuesday for a blood pressure check   Follow up with me for your physical   Take your blood pressure medication tonight  Please let me know if you need anything

## 2016-01-02 ENCOUNTER — Encounter: Payer: Self-pay | Admitting: Gastroenterology

## 2016-01-02 ENCOUNTER — Other Ambulatory Visit: Payer: Self-pay

## 2016-01-02 ENCOUNTER — Telehealth: Payer: Self-pay | Admitting: Adult Health

## 2016-01-02 MED ORDER — FUROSEMIDE 40 MG PO TABS
40.0000 mg | ORAL_TABLET | Freq: Every day | ORAL | 0 refills | Status: DC
Start: 2016-01-02 — End: 2016-04-28

## 2016-01-02 NOTE — Telephone Encounter (Signed)
Patient is asking that furosemide (LASIX) 40 MG tablet [[id number]]   be called in to pharmacy on file.

## 2016-01-02 NOTE — Telephone Encounter (Signed)
Ok to refill 

## 2016-01-02 NOTE — Telephone Encounter (Signed)
Rx refilled.

## 2016-01-06 ENCOUNTER — Ambulatory Visit: Payer: Self-pay

## 2016-02-03 ENCOUNTER — Ambulatory Visit (AMBULATORY_SURGERY_CENTER): Payer: Self-pay | Admitting: *Deleted

## 2016-02-03 ENCOUNTER — Encounter: Payer: Self-pay | Admitting: Gastroenterology

## 2016-02-03 VITALS — Ht 70.5 in | Wt 263.0 lb

## 2016-02-03 DIAGNOSIS — Z1211 Encounter for screening for malignant neoplasm of colon: Secondary | ICD-10-CM

## 2016-02-03 MED ORDER — NA SULFATE-K SULFATE-MG SULF 17.5-3.13-1.6 GM/177ML PO SOLN: 1.0000 | Freq: Once | ORAL | 0 refills | Status: AC

## 2016-02-03 NOTE — Progress Notes (Signed)
No egg or soy allergy known to patient  No issues with past sedation with any surgeries  or procedures, no intubation problems  No diet pills per patient No home 02 use per patient  No blood thinners per patient  Pt denies issues with constipation  No A fib or A flutter  emmi to e mail   

## 2016-02-13 ENCOUNTER — Telehealth: Payer: Self-pay | Admitting: Gastroenterology

## 2016-02-13 NOTE — Telephone Encounter (Signed)
No that's okay. Thanks 

## 2016-02-17 ENCOUNTER — Encounter: Payer: Self-pay | Admitting: Gastroenterology

## 2016-02-27 ENCOUNTER — Other Ambulatory Visit: Payer: Self-pay | Admitting: Internal Medicine

## 2016-02-27 ENCOUNTER — Other Ambulatory Visit: Payer: Self-pay | Admitting: Adult Health

## 2016-02-29 ENCOUNTER — Emergency Department (HOSPITAL_COMMUNITY): Payer: 59

## 2016-02-29 ENCOUNTER — Encounter (HOSPITAL_COMMUNITY): Payer: Self-pay

## 2016-02-29 ENCOUNTER — Emergency Department (HOSPITAL_COMMUNITY)
Admission: EM | Admit: 2016-02-29 | Discharge: 2016-02-29 | Disposition: A | Discharge status: Home / Self Care | Payer: 59 | Attending: Emergency Medicine | Admitting: Emergency Medicine

## 2016-02-29 DIAGNOSIS — S83411A Sprain of medial collateral ligament of right knee, initial encounter: Secondary | ICD-10-CM

## 2016-02-29 DIAGNOSIS — Y999 Unspecified external cause status: Secondary | ICD-10-CM | POA: Diagnosis not present

## 2016-02-29 DIAGNOSIS — Y9301 Activity, walking, marching and hiking: Secondary | ICD-10-CM | POA: Diagnosis not present

## 2016-02-29 DIAGNOSIS — Z79899 Other long term (current) drug therapy: Secondary | ICD-10-CM | POA: Diagnosis not present

## 2016-02-29 DIAGNOSIS — Z7982 Long term (current) use of aspirin: Secondary | ICD-10-CM | POA: Diagnosis not present

## 2016-02-29 DIAGNOSIS — E119 Type 2 diabetes mellitus without complications: Secondary | ICD-10-CM | POA: Insufficient documentation

## 2016-02-29 DIAGNOSIS — X509XXA Other and unspecified overexertion or strenuous movements or postures, initial encounter: Secondary | ICD-10-CM | POA: Insufficient documentation

## 2016-02-29 DIAGNOSIS — Y929 Unspecified place or not applicable: Secondary | ICD-10-CM | POA: Insufficient documentation

## 2016-02-29 DIAGNOSIS — I110 Hypertensive heart disease with heart failure: Secondary | ICD-10-CM | POA: Insufficient documentation

## 2016-02-29 DIAGNOSIS — Z7984 Long term (current) use of oral hypoglycemic drugs: Secondary | ICD-10-CM | POA: Diagnosis not present

## 2016-02-29 DIAGNOSIS — S8991XA Unspecified injury of right lower leg, initial encounter: Secondary | ICD-10-CM | POA: Diagnosis present

## 2016-02-29 DIAGNOSIS — I5022 Chronic systolic (congestive) heart failure: Secondary | ICD-10-CM | POA: Insufficient documentation

## 2016-02-29 MED ORDER — DEXAMETHASONE 4 MG PO TABS
10.0000 mg | ORAL_TABLET | Freq: Once | ORAL | Status: AC
  Administered 2016-02-29: 10 mg via ORAL
  Filled 2016-02-29: qty 3

## 2016-02-29 MED ORDER — NAPROXEN 375 MG PO TABS: 375.0000 mg | ORAL_TABLET | Freq: Two times a day (BID) | ORAL | 0 refills | Status: AC | PRN

## 2016-02-29 MED ORDER — HYDROCODONE-ACETAMINOPHEN 5-325 MG PO TABS
2.0000 | ORAL_TABLET | Freq: Once | ORAL | Status: AC
  Administered 2016-02-29: 2 via ORAL
  Filled 2016-02-29: qty 2

## 2016-02-29 NOTE — ED Provider Notes (Signed)
[facility]-EMERGENCY DEPT Provider Note   CSN: [id number] Arrival date & time: 02/29/16  1330     History   Chief Complaint Chief Complaint  Patient presents with  . Knee Pain    HPI Caleb Garcia is a 50 y.o. male.  HPI 50 year old male with history of obstructive sleep apnea, diabetes, obesity, chronic knee pain who presents with right knee pain. Patient states he woke up 2 weeks ago and began walking. He noticed a mild, aching, throbbing sensation on the medial aspect of his knee. Since then, he has had progressively worsening knee pain. The pain is worse with weightbearing and does not hurt him when he is lying flat. It occasionally swells up when he has been walking on it but this resolves with rest. Denies any direct trauma to the knee. Denies any swelling or redness. No history of gout. No recent fevers or chills.  Past Medical History:  Diagnosis Date  . Cardiomyopathy    Non-ischemic  . CHF (congestive heart failure) (HCC)   . Diabetes mellitus without complication (HCC)   . HTN (hypertension)   . Hyperlipidemia   . Hypokalemia   . Obesity   . OSA (obstructive sleep apnea)   . Sleep apnea    had surgery- no cpap  . Systolic CHF [facility])     Patient Active Problem List   Diagnosis Date Noted  . Controlled type 2 diabetes mellitus without complication (HCC) 11/26/2013  . Bilateral shoulder pain 10/31/2013  . Breast tenderness in male 10/31/2013  . Erectile dysfunction 04/18/2013  . Mixed hyperlipidemia 11/26/2009  . LIMB PAIN 07/23/2009  . GERD 04/29/2009  . HYPOKALEMIA 10/22/2008  . OBESITY 10/22/2008  . OBSTRUCTIVE SLEEP APNEA 10/22/2008  . Essential hypertension 10/22/2008  . CARDIOMYOPATHY 10/22/2008  . CONGESTIVE HEART FAILURE, MILD 10/22/2008  . Chronic systolic heart failure (HCC) 10/16/2008    Past Surgical History:  Procedure Laterality Date  . avulsion fracture of left hip    . CARDIAC CATHETERIZATION  10/08/08  . TONSILLECTOMY    .  UVULOPALATOPHARYNGOPLASTY         Home Medications    Prior to Admission medications   Medication Sig Start Date End Date Taking? Authorizing Provider  aspirin 81 MG tablet Take 81 mg by mouth daily.      Historical Provider, MD  atorvastatin (LIPITOR) 20 MG tablet Take 1 tablet (20 mg total) by mouth daily. 01/01/16   Shirline Frees, NP  carvedilol (COREG) 25 MG tablet TAKE ONE TABLET BY MOUTH TWICE DAILY WITH A MEAL 07/10/15   Dolores Patty, MD  diclofenac sodium (VOLTAREN) 1 % GEL Apply 4 g topically 4 (four) times daily. To shoulder 12/21/15   Linna Hoff, MD  felodipine (PLENDIL) 2.5 MG 24 hr tablet Take 1 tablet (2.5 mg total) by mouth daily. 01/01/16   Shirline Frees, NP  furosemide (LASIX) 40 MG tablet Take 1 tablet (40 mg total) by mouth daily. 01/02/16   Shirline Frees, NP  hydrALAZINE (APRESOLINE) 25 MG tablet TAKE ONE TABLET BY MOUTH THREE TIMES DAILY 08/21/15   Dolores Patty, MD  ibuprofen (ADVIL,MOTRIN) 600 MG tablet Take 1 tablet (600 mg total) by mouth every 6 (six) hours as needed. 12/24/15   Derwood Kaplan, MD  lisinopril (PRINIVIL,ZESTRIL) 40 MG tablet Take 1 tablet (40 mg total) by mouth daily. 01/01/16   Shirline Frees, NP  metFORMIN (GLUCOPHAGE) 500 MG tablet Take 1 tablet (500 mg total) by mouth 2 (two) times daily with a meal.  Patient not taking: Reported on 02/03/2016 01/01/16   Shirline Freesory Nafziger, NP  naproxen (NAPROSYN) 375 MG tablet Take 1 tablet (375 mg total) by mouth 2 (two) times daily as needed for moderate pain. 02/29/16 03/07/16  Shaune Pollackameron Ivo Moga, MD  potassium chloride (K-DUR) 10 MEQ tablet Take 1 tablet (10 mEq total) by mouth daily. 01/01/16   Shirline Freesory Nafziger, NP  spironolactone (ALDACTONE) 25 MG tablet Take 2 tablets (50 mg total) by mouth daily. 01/01/16   Shirline Freesory Nafziger, NP  tadalafil (CIALIS) 20 MG tablet Take 1 tablet (20 mg total) by mouth daily as needed for erectile dysfunction. 12/04/14   Shirline Freesory Nafziger, NP    Family History Family History  Problem Relation  Age of Onset  . Diabetes Mother   . Hypertension Mother   . Heart disease Mother   . Cancer Father     Throat cancer  . Stroke Father   . Prostate cancer Neg Hx   . Colon cancer Neg Hx   . Colon polyps Neg Hx   . Esophageal cancer Neg Hx   . Rectal cancer Neg Hx   . Stomach cancer Neg Hx     Social History Social History  Substance Use Topics  . Smoking status: Never Smoker  . Smokeless tobacco: Never Used  . Alcohol use No     Allergies   Shrimp [shellfish allergy]   Review of Systems Review of Systems  Constitutional: Negative for chills and fever.  Respiratory: Negative for shortness of breath.   Cardiovascular: Negative for chest pain.  Musculoskeletal: Positive for arthralgias and joint swelling. Negative for neck pain.  Skin: Negative for rash and wound.  Allergic/Immunologic: Negative for immunocompromised state.  Neurological: Negative for weakness and numbness.  Hematological: Does not bruise/bleed easily.     Physical Exam Updated Vital Signs BP 129/83 (BP Location: Right Arm)   Pulse 83   Temp 98.3 F (36.8 C)   Resp 18   SpO2 100%   Physical Exam  Constitutional: He is oriented to person, place, and time. He appears well-developed and well-nourished. No distress.  HENT:  Head: Normocephalic and atraumatic.  Eyes: Conjunctivae are normal.  Neck: Neck supple.  Cardiovascular: Normal rate, regular rhythm and normal heart sounds.   Pulmonary/Chest: Effort normal. No respiratory distress. He has no wheezes.  Abdominal: He exhibits no distension.  Musculoskeletal: He exhibits no edema.  Neurological: He is alert and oriented to person, place, and time. He exhibits normal muscle tone.  Skin: Skin is warm. Capillary refill takes less than 2 seconds. No rash noted.  Nursing note and vitals reviewed.   LOWER EXTREMITY EXAM: RIGHT  INSPECTION & PALPATION: Mild tenderness to palpation over medial aspect of the knee, particularly along the MCL  distribution. No overlying erythema. No joint effusion or warmth. Passive range of motion is full and painless. There is no overt ligamentous laxity on valgus and varus stress testing as well as anterior cruciate ligament and PCL testing.  SENSORY: sensation is intact to light touch in:  Superficial peroneal nerve distribution (over dorsum of foot) Deep peroneal nerve distribution (over first dorsal web space) Sural nerve distribution (over lateral aspect 5th metatarsal) Saphenous nerve distribution (over medial instep)  MOTOR:  + Motor EHL (great toe dorsiflexion) + FHL (great toe plantar flexion)  + TA (ankle dorsiflexion)  + GSC (ankle plantar flexion)  VASCULAR: 2+ dorsalis pedis and posterior tibialis pulses Capillary refill < 2 sec, toes warm and well-perfused  COMPARTMENTS: Soft, warm, well-perfused No pain  with passive extension No parethesias   ED Treatments / Results  Labs (all labs ordered are listed, but only abnormal results are displayed) Labs Reviewed - No data to display  EKG  EKG Interpretation None       Radiology Dg Knee 2 Views Right  Result Date: 02/29/2016 CLINICAL DATA:  Knee pain for 2 weeks common no known injury, initial encounter EXAM: RIGHT KNEE - 1-2 VIEW COMPARISON:  None. FINDINGS: Tricompartmental degenerative change is noted worse in the medial joint space. No joint effusion or acute fracture is noted. No soft tissue abnormality is seen. IMPRESSION: Mild degenerative change without acute abnormality. Electronically Signed   By: Alcide Clever M.D.   On: 02/29/2016 14:24    Procedures Procedures (including critical care time)  Medications Ordered in ED Medications  dexamethasone (DECADRON) tablet 10 mg (10 mg Oral Given 02/29/16 1451)  HYDROcodone-acetaminophen (NORCO/VICODIN) 5-325 MG per tablet 2 tablet (2 tablets Oral Given 02/29/16 1451)     Initial Impression / Assessment and Plan / ED Course  I have reviewed the triage vital  signs and the nursing notes.  Pertinent labs & imaging results that were available during my care of the patient were reviewed by me and considered in my medical decision making (see chart for details).  Clinical Course     50 year old African-American male here with atraumatic right knee pain. Patient has history of tendon injury and left knee. Primary suspicion is likely mild knee sprain, with possible MCL tendinitis, in setting of altering his gait due to left knee pain. He has no fevers or chills. There is no joint warmth, effusion, and passive range of motion is full and painless. I do not suspect septic or inflammatory arthritis. Plain films obtained and showed no evidence of fracture or bony abnormality. There is chronic arthritic changes. Will place in knee sleeve for comfort, give single-dose of Decadron for possible inflammatory component (risks/benefits, particularly with blood sugars, discussed), and discharge with outpatient Ortho follow-up. No TTP along posterior joint line to suggest mensical injury and McMurray's is negative.  Of note, patient also endorses intermittent tingling in his hands for several months. These are usually after he runs his motorcycle. He has a history of diabetes as well. Suspect mild neuropathy, with possible contribution from swelling due to motorcycle use. No evidence of significant carpal tunnel on exam. He has no signs of distal neurovascular compromise. Will refer for PCP follow-up and advise supportive care.  Final Clinical Impressions(s) / ED Diagnoses   Final diagnoses:  Sprain of medial collateral ligament of right knee, initial encounter    New Prescriptions Discharge Medication List as of 02/29/2016  2:57 PM    START taking these medications   Details  naproxen (NAPROSYN) 375 MG tablet Take 1 tablet (375 mg total) by mouth 2 (two) times daily as needed for moderate pain., Starting Sun 02/29/2016, Until Sun 03/07/2016, Print         Shaune Pollack, MD 02/29/16 [phone]

## 2016-02-29 NOTE — ED Triage Notes (Signed)
Patient complains of right knee pain for 2 weeks. States that the pain is worse with ambulation, denies trauma

## 2016-02-29 NOTE — Progress Notes (Signed)
Orthopedic Tech Progress Note Patient Details:  Caleb Garcia 15-Oct-1965 [id number]  Ortho Devices Type of Ortho Device: Knee Sleeve Ortho Device/Splint Location: rle Ortho Device/Splint Interventions: Application   [name] 02/29/2016, 2:54 PM

## 2016-03-09 ENCOUNTER — Other Ambulatory Visit: Payer: Self-pay | Admitting: Adult Health

## 2016-03-09 DIAGNOSIS — I5022 Chronic systolic (congestive) heart failure: Secondary | ICD-10-CM

## 2016-03-09 DIAGNOSIS — Z760 Encounter for issue of repeat prescription: Secondary | ICD-10-CM

## 2016-03-10 ENCOUNTER — Other Ambulatory Visit: Payer: Self-pay

## 2016-03-10 DIAGNOSIS — Z760 Encounter for issue of repeat prescription: Secondary | ICD-10-CM

## 2016-03-10 DIAGNOSIS — I5022 Chronic systolic (congestive) heart failure: Secondary | ICD-10-CM

## 2016-03-10 MED ORDER — SPIRONOLACTONE 25 MG PO TABS: 50.0000 mg | ORAL_TABLET | Freq: Every day | ORAL | 6 refills | Status: DC

## 2016-03-12 ENCOUNTER — Encounter: Payer: Self-pay | Admitting: Gastroenterology

## 2016-04-05 ENCOUNTER — Other Ambulatory Visit: Payer: Self-pay | Admitting: Internal Medicine

## 2016-04-17 ENCOUNTER — Other Ambulatory Visit (INDEPENDENT_AMBULATORY_CARE_PROVIDER_SITE_OTHER): Payer: 59 | Admitting: Family Medicine

## 2016-04-17 ENCOUNTER — Ambulatory Visit (INDEPENDENT_AMBULATORY_CARE_PROVIDER_SITE_OTHER): Payer: Self-pay | Admitting: Family Medicine

## 2016-04-17 VITALS — BP 125/88 | HR 82 | Temp 98.0°F | Resp 17 | Ht 69.5 in | Wt 242.0 lb

## 2016-04-17 DIAGNOSIS — E119 Type 2 diabetes mellitus without complications: Secondary | ICD-10-CM | POA: Diagnosis not present

## 2016-04-17 DIAGNOSIS — Z024 Encounter for examination for driving license: Secondary | ICD-10-CM

## 2016-04-17 LAB — POCT GLYCOSYLATED HEMOGLOBIN (HGB A1C): Hemoglobin A1C: 6.7

## 2016-04-17 NOTE — Progress Notes (Signed)
Commercial Driver Medical Examination   Caleb Garcia is a 51 y.o. male who presents today for a commercial driver fitness determination physical exam. The patient reports no problems. The following portions of the patient's history were reviewed and updated as appropriate: allergies, current medications, past family history, past medical history, past social history, past surgical history and problem list. Pt has chronic medical problems of DM II (not insulin dependent), HTN, HPL. Denies OSA.  Has not had a1c checked in >1 yr. PLanning to sched an appt with his PCP. PCP records are avail for my review with pt consent. Review of Systems A comprehensive review of systems was negative.   Objective:    Vision:  Visual Acuity Screening   Right eye Left eye Both eyes  Without correction:     With correction: 20/13 20/13 20/13   Comments: Peripheral: R 85 L 85 The patient can distinguish the colors red, amber and green.      Applicant can recognize and distinguish among traffic control signals and devices showing standard red, green, and amber colors.  Applicant meets visual acuity requirement only when wearing corrective lenses.  Monocular Vision?: No   Hearing: Hearing Screening Comments: The patient was able to hear a forced whisper from 10 feet.    BP 125/88 (BP Location: Left Arm, Cuff Size: Large)   Pulse 82   Temp 98 F (36.7 C) (Oral)   Resp 17   Ht 5' 9.5" (1.765 m)   Wt 242 lb (109.8 kg)   SpO2 98%   BMI 35.22 kg/m   General Appearance:    Alert, cooperative, no distress, appears stated age  Head:    Normocephalic, without obvious abnormality, atraumatic  Eyes:    PERRL, conjunctiva/corneas clear, EOM's intact, fundi    benign, both eyes       Ears:    Normal TM's and external ear canals, both ears  Nose:   Nares normal, septum midline, mucosa normal, no drainage    or sinus tenderness  Throat:   Lips, mucosa, and tongue normal; teeth and gums normal  Neck:    Supple, symmetrical, trachea midline, no adenopathy;       thyroid:  No enlargement/tenderness/nodules; no carotid   bruit or JVD  Back:     Symmetric, no curvature, ROM normal, no CVA tenderness  Lungs:     Clear to auscultation bilaterally, respirations unlabored  Chest wall:    No tenderness or deformity  Heart:    Regular rate and rhythm, S1 and S2 normal, no murmur, rub   or gallop  Abdomen:     Soft, non-tender, bowel sounds active all four quadrants,    no masses, no organomegaly        Extremities:   Extremities normal, atraumatic, no cyanosis or edema  Pulses:   2+ and symmetric all extremities  Skin:   Skin color, texture, turgor normal, no rashes or lesions  Lymph nodes:   Cervical, supraclavicular, and axillary nodes normal  Neurologic:   CNII-XII intact. Normal strength, sensation and reflexes      throughout     Labs UA SG 1.010, neg prog, neg bld, neg sugar hgba1c 04/17/2016 6.7  Assessment:    Healthy male exam.  Meets standards, but periodic monitoring required due to HTN, type 2 DM, HLD, PVD.  Driver qualified only for 1 year.    Plan:    Medical examiners certificate completed and printed. Return as needed.

## 2016-04-17 NOTE — Patient Instructions (Signed)
     IF you received an x-ray today, you will receive an invoice from [facility]. Please contact [facility] at [phone] with questions or concerns regarding your invoice.   IF you received labwork today, you will receive an invoice from [facility]. Please contact [facility] at [phone] with questions or concerns regarding your invoice.   Our billing staff will not be able to assist you with questions regarding bills from these companies.  You will be contacted with the lab results as soon as they are available. The fastest way to get your results is to activate your My Chart account. Instructions are located on the last page of this paperwork. If you have not heard from us regarding the results in 2 weeks, please contact this office.     

## 2016-04-28 ENCOUNTER — Telehealth: Payer: Self-pay | Admitting: Adult Health

## 2016-04-28 ENCOUNTER — Other Ambulatory Visit: Payer: Self-pay

## 2016-04-28 DIAGNOSIS — E119 Type 2 diabetes mellitus without complications: Secondary | ICD-10-CM

## 2016-04-28 DIAGNOSIS — I5022 Chronic systolic (congestive) heart failure: Secondary | ICD-10-CM

## 2016-04-28 DIAGNOSIS — Z760 Encounter for issue of repeat prescription: Secondary | ICD-10-CM

## 2016-04-28 MED ORDER — FELODIPINE ER 2.5 MG PO TB24: 2.5000 mg | ORAL_TABLET | Freq: Every day | ORAL | 0 refills | Status: DC

## 2016-04-28 MED ORDER — CARVEDILOL 25 MG PO TABS: 25.0000 mg | ORAL_TABLET | Freq: Two times a day (BID) | ORAL | 0 refills | Status: DC

## 2016-04-28 MED ORDER — POTASSIUM CHLORIDE ER 10 MEQ PO TBCR: 10.0000 meq | EXTENDED_RELEASE_TABLET | Freq: Every day | ORAL | 0 refills | Status: DC

## 2016-04-28 MED ORDER — SPIRONOLACTONE 25 MG PO TABS: 50.0000 mg | ORAL_TABLET | Freq: Every day | ORAL | 0 refills | Status: DC

## 2016-04-28 MED ORDER — HYDRALAZINE HCL 25 MG PO TABS: 25.0000 mg | ORAL_TABLET | Freq: Three times a day (TID) | ORAL | 0 refills | Status: DC

## 2016-04-28 MED ORDER — LISINOPRIL 40 MG PO TABS: 40.0000 mg | ORAL_TABLET | Freq: Every day | ORAL | 0 refills | Status: DC

## 2016-04-28 MED ORDER — METFORMIN HCL 500 MG PO TABS: 500.0000 mg | ORAL_TABLET | Freq: Two times a day (BID) | ORAL | 0 refills | Status: DC

## 2016-04-28 MED ORDER — FUROSEMIDE 40 MG PO TABS: 40.0000 mg | ORAL_TABLET | Freq: Every day | ORAL | 0 refills | Status: DC

## 2016-04-28 MED ORDER — ATORVASTATIN CALCIUM 20 MG PO TABS: 20.0000 mg | ORAL_TABLET | Freq: Every day | ORAL | 0 refills | Status: DC

## 2016-04-28 NOTE — Telephone Encounter (Signed)
Ok to refill all medications? Patient last had CPE labs 02/11/15.

## 2016-04-28 NOTE — Telephone Encounter (Signed)
° ° ° °  Pt call to say he need all his meds refilled    Pharmacy [facility] [address]

## 2016-04-28 NOTE — Telephone Encounter (Signed)
All prescriptions have been sent in. Patient notified to schedule physical for further refills.

## 2016-04-28 NOTE — Telephone Encounter (Signed)
Ok to fill for 90 days. Inform him he needs a physical for further refills.

## 2016-05-02 NOTE — Progress Notes (Signed)
Lab only 

## 2016-05-04 ENCOUNTER — Ambulatory Visit (INDEPENDENT_AMBULATORY_CARE_PROVIDER_SITE_OTHER): Payer: 59 | Admitting: Adult Health

## 2016-05-04 ENCOUNTER — Encounter: Payer: Self-pay | Admitting: Adult Health

## 2016-05-04 VITALS — BP 174/110 | Temp 98.1°F | Ht 69.5 in | Wt 253.1 lb

## 2016-05-04 DIAGNOSIS — Z1211 Encounter for screening for malignant neoplasm of colon: Secondary | ICD-10-CM

## 2016-05-04 DIAGNOSIS — M25562 Pain in left knee: Secondary | ICD-10-CM

## 2016-05-04 DIAGNOSIS — E782 Mixed hyperlipidemia: Secondary | ICD-10-CM

## 2016-05-04 DIAGNOSIS — Z0000 Encounter for general adult medical examination without abnormal findings: Secondary | ICD-10-CM

## 2016-05-04 DIAGNOSIS — I10 Essential (primary) hypertension: Secondary | ICD-10-CM

## 2016-05-04 DIAGNOSIS — M25561 Pain in right knee: Secondary | ICD-10-CM

## 2016-05-04 DIAGNOSIS — G8929 Other chronic pain: Secondary | ICD-10-CM

## 2016-05-04 DIAGNOSIS — E119 Type 2 diabetes mellitus without complications: Secondary | ICD-10-CM

## 2016-05-04 LAB — LIPID PANEL
CHOL/HDL RATIO: 4
CHOLESTEROL: 172 mg/dL (ref 0–200)
HDL: 40.9 mg/dL (ref >39.00)
LDL CALC: 98 mg/dL (ref 0–99)
NONHDL: 130.87
Triglycerides: 162 mg/dL — ABNORMAL HIGH (ref 0.0–149.0)
VLDL: 32.4 mg/dL (ref 0.0–40.0)

## 2016-05-04 LAB — CBC WITH DIFFERENTIAL/PLATELET
Basophils Absolute: 0 10*3/uL (ref 0.0–0.1)
Basophils Relative: 0.3 % (ref 0.0–3.0)
EOS PCT: 1.4 % (ref 0.0–5.0)
Eosinophils Absolute: 0.1 10*3/uL (ref 0.0–0.7)
HCT: 37.5 % — ABNORMAL LOW (ref 39.0–52.0)
Hemoglobin: 12.6 g/dL — ABNORMAL LOW (ref 13.0–17.0)
Lymphocytes Relative: 32.9 % (ref 12.0–46.0)
Lymphs Abs: 1.7 10*3/uL (ref 0.7–4.0)
MCHC: 33.7 g/dL (ref 30.0–36.0)
MCV: 90.3 fl (ref 78.0–100.0)
MONO ABS: 0.4 10*3/uL (ref 0.1–1.0)
MONOS PCT: 8.2 % (ref 3.0–12.0)
Neutro Abs: 2.9 10*3/uL (ref 1.4–7.7)
Neutrophils Relative %: 57.2 % (ref 43.0–77.0)
Platelets: 233 10*3/uL (ref 150.0–400.0)
RBC: 4.15 Mil/uL — ABNORMAL LOW (ref 4.22–5.81)
RDW: 15.1 % (ref 11.5–15.5)
WBC: 5 10*3/uL (ref 4.0–10.5)

## 2016-05-04 LAB — BASIC METABOLIC PANEL
BUN: 14 mg/dL (ref 6–23)
CO2: 25 mEq/L (ref 19–32)
Calcium: 9 mg/dL (ref 8.4–10.5)
Chloride: 106 mEq/L (ref 96–112)
Creatinine, Ser: 0.88 mg/dL (ref 0.40–1.50)
GFR: 117.64 mL/min (ref >60.00)
Glucose, Bld: 119 mg/dL — ABNORMAL HIGH (ref 70–99)
POTASSIUM: 3.9 meq/L (ref 3.5–5.1)
SODIUM: 137 meq/L (ref 135–145)

## 2016-05-04 LAB — HEPATIC FUNCTION PANEL
ALK PHOS: 35 U/L — AB (ref 39–117)
ALT: 11 U/L (ref 0–53)
AST: 12 U/L (ref 0–37)
Albumin: 4.1 g/dL (ref 3.5–5.2)
BILIRUBIN DIRECT: 0.1 mg/dL (ref 0.0–0.3)
BILIRUBIN TOTAL: 0.4 mg/dL (ref 0.2–1.2)
Total Protein: 6.8 g/dL (ref 6.0–8.3)

## 2016-05-04 LAB — POC URINALSYSI DIPSTICK (AUTOMATED)
Bilirubin, UA: NEGATIVE
Blood, UA: NEGATIVE
Glucose, UA: NEGATIVE
KETONES UA: NEGATIVE
Leukocytes, UA: NEGATIVE
Nitrite, UA: NEGATIVE
PROTEIN UA: NEGATIVE
Urobilinogen, UA: 0.2
pH, UA: 6

## 2016-05-04 LAB — PSA: PSA: 0.54 ng/mL (ref 0.10–4.00)

## 2016-05-04 LAB — HEMOGLOBIN A1C: HEMOGLOBIN A1C: 6.7 % — AB (ref 4.6–6.5)

## 2016-05-04 LAB — TSH: TSH: 1.25 u[IU]/mL (ref 0.35–4.50)

## 2016-05-04 MED ORDER — MELOXICAM 7.5 MG PO TABS: 7.5000 mg | ORAL_TABLET | Freq: Every day | ORAL | 0 refills | Status: DC

## 2016-05-04 NOTE — Progress Notes (Signed)
Subjective:    Patient ID: Caleb Garcia, male    DOB: 01/20/1966, 51 y.o.   MRN: [id number]  HPI  Patient presents for yearly preventative medicine examination. He is a pleasant 51 year male who  has a past medical history of Cardiomyopathy; CHF (congestive heart failure) (HCC); Diabetes mellitus without complication (HCC); HTN (hypertension); Hyperlipidemia; Hypokalemia; Obesity; OSA (obstructive sleep apnea); Sleep apnea; and Systolic CHF (HCC).  All immunizations and health maintenance protocols were reviewed with the patient and needed orders were placed.  Appropriate screening laboratory values were ordered for the patient including screening of hyperlipidemia, renal function and hepatic function. If indicated by BPH, a PSA was ordered.  Medication reconciliation,  past medical history, social history, problem list and allergies were reviewed in detail with the patient  Goals were established with regard to weight loss, exercise, and  diet in compliance with medications. He is working on portion control and has been eating healthy. He has a physically demanding job.   He has not seen his cardiologist on over a year due to history of hypertension,  CHF, and cardiomyopathy  His blood pressure is elevated as he has been out of his medications. A 90 day refill was sent in 6 days ago but he never got a call for it.   His only acute complaint is that of bilateral knee pain. This has been on ongoing issue. OTC Nsaids are no longer working well for him.   Review of Systems  Constitutional: Negative.   HENT: Negative.   Eyes: Negative.   Respiratory: Negative.   Cardiovascular: Negative.   Gastrointestinal: Negative.   Endocrine: Negative.   Genitourinary: Negative.   Musculoskeletal: Positive for arthralgias. Negative for gait problem, joint swelling and neck pain.  Skin: Negative.   Allergic/Immunologic: Negative.   Neurological: Negative.   Hematological: Negative.     Psychiatric/Behavioral: Negative.   All other systems reviewed and are negative.  Past Medical History:  Diagnosis Date  . Cardiomyopathy    Non-ischemic  . CHF (congestive heart failure) (HCC)   . Diabetes mellitus without complication (HCC)   . HTN (hypertension)   . Hyperlipidemia   . Hypokalemia   . Obesity   . OSA (obstructive sleep apnea)   . Sleep apnea    had surgery- no cpap  . Systolic CHF [facility])     Social History   Social History  . Marital status: Married    Spouse name: N/A  . Number of children: N/A  . Years of education: N/A   Occupational History  .  Unemployed   Social History Main Topics  . Smoking status: Never Smoker  . Smokeless tobacco: Never Used  . Alcohol use No  . Drug use: No  . Sexual activity: Not on file   Other Topics Concern  . Not on file   Social History Narrative   Married 6 years   2 biologic daughters   2 stepsons, one Programme researcher, broadcasting/film/video for [employer]     Past Surgical History:  Procedure Laterality Date  . avulsion fracture of left hip    . CARDIAC CATHETERIZATION  10/08/08  . TONSILLECTOMY    . UVULOPALATOPHARYNGOPLASTY      Family History  Problem Relation Age of Onset  . Diabetes Mother   . Hypertension Mother   . Heart disease Mother   . Cancer Father     Throat cancer  . Stroke Father   . Prostate cancer Neg Hx   .  Colon cancer Neg Hx   . Colon polyps Neg Hx   . Esophageal cancer Neg Hx   . Rectal cancer Neg Hx   . Stomach cancer Neg Hx     Allergies  Allergen Reactions  . Shrimp [Shellfish Allergy]     Current Outpatient Prescriptions on File Prior to Visit  Medication Sig Dispense Refill  . aspirin 81 MG tablet Take 81 mg by mouth daily.      Marland Kitchen atorvastatin (LIPITOR) 20 MG tablet Take 1 tablet (20 mg total) by mouth daily. 90 tablet 0  . carvedilol (COREG) 25 MG tablet Take 1 tablet (25 mg total) by mouth 2 (two) times daily with a meal. 180 tablet 0  . felodipine (PLENDIL) 2.5 MG 24 hr  tablet Take 1 tablet (2.5 mg total) by mouth daily. 90 tablet 0  . furosemide (LASIX) 40 MG tablet Take 1 tablet (40 mg total) by mouth daily. 90 tablet 0  . hydrALAZINE (APRESOLINE) 25 MG tablet Take 1 tablet (25 mg total) by mouth 3 (three) times daily. 270 tablet 0  . lisinopril (PRINIVIL,ZESTRIL) 40 MG tablet Take 1 tablet (40 mg total) by mouth daily. 90 tablet 0  . metFORMIN (GLUCOPHAGE) 500 MG tablet Take 1 tablet (500 mg total) by mouth 2 (two) times daily with a meal. 180 tablet 0  . potassium chloride (K-DUR) 10 MEQ tablet Take 1 tablet (10 mEq total) by mouth daily. 90 tablet 0  . spironolactone (ALDACTONE) 25 MG tablet Take 2 tablets (50 mg total) by mouth daily. 180 tablet 0  . tadalafil (CIALIS) 20 MG tablet Take 1 tablet (20 mg total) by mouth daily as needed for erectile dysfunction. 10 tablet 3   No current facility-administered medications on file prior to visit.     BP (!) 174/110   Temp 98.1 F (36.7 C) (Oral)   Ht 5' 9.5" (1.765 m)   Wt 253 lb 1.6 oz (114.8 kg)   BMI 36.84 kg/m       Objective:   Physical Exam  Constitutional: He is oriented to person, place, and time. He appears well-developed and well-nourished. No distress.  Obese   HENT:  Head: Normocephalic and atraumatic.  Right Ear: External ear normal.  Left Ear: External ear normal.  Nose: Nose normal.  Mouth/Throat: Oropharynx is clear and moist. No oropharyngeal exudate.  Eyes: Conjunctivae and EOM are normal. Pupils are equal, round, and reactive to light. Right eye exhibits no discharge. Left eye exhibits no discharge. No scleral icterus.  Neck: Normal range of motion. Neck supple. No JVD present. No tracheal deviation present. No thyromegaly present.  Cardiovascular: Normal rate, regular rhythm, normal heart sounds and intact distal pulses.  Exam reveals no gallop and no friction rub.   No murmur heard. Pulmonary/Chest: Effort normal and breath sounds normal. No stridor. No respiratory distress.  He has no wheezes. He has no rales. He exhibits no tenderness.  Abdominal: Soft. Bowel sounds are normal. He exhibits no distension and no mass. There is no tenderness. There is no rebound and no guarding.  Genitourinary: Rectum normal. Rectal exam shows guaiac negative stool. No penile tenderness.  Musculoskeletal: Normal range of motion. He exhibits no edema, tenderness or deformity.  Lymphadenopathy:    He has no cervical adenopathy.  Neurological: He is alert and oriented to person, place, and time. He has normal reflexes. He displays normal reflexes. No cranial nerve deficit. He exhibits normal muscle tone. Coordination normal.  Skin: Skin is warm and dry.  No rash noted. He is not diaphoretic. No erythema. No pallor.  Psychiatric: He has a normal mood and affect. His behavior is normal. Judgment and thought content normal.  Nursing note and vitals reviewed.     Assessment & Plan:  1. Routine general medical examination at a health care facility - Follow up in one year for CPE - Continue to exercise and eat healthy - Basic metabolic panel - CBC with Differential/Platelet - Hemoglobin A1c - Hepatic function panel - Lipid panel - POCT Urinalysis Dipstick (Automated) - TSH - PSA  2. Colon cancer screening - Colonoscopy ordered  3. Controlled type 2 diabetes mellitus without complication, without long-term current use of insulin (HCC)  - Basic metabolic panel - CBC with Differential/Platelet - Hemoglobin A1c - Hepatic function panel - Lipid panel - POCT Urinalysis Dipstick (Automated) - TSH - PSA - Consider changing medications  4. Mixed hyperlipidemia  - Basic metabolic panel - CBC with Differential/Platelet - Hemoglobin A1c - Hepatic function panel - Lipid panel - POCT Urinalysis Dipstick (Automated) - TSH - PSA - Consider increase in Lipitor  5. Essential hypertension - Not at goal. He has been out of his medications. He has 90 day refills at [facility]  - Basic  metabolic panel - CBC with Differential/Platelet - Hemoglobin A1c - Hepatic function panel - Lipid panel - POCT Urinalysis Dipstick (Automated) - TSH - PSA - Follow up with cardiology  6. Chronic pain of both knees - Arthritic pain . Consider injections or referral to ortho in the future - meloxicam (MOBIC) 7.5 MG tablet; Take 1 tablet (7.5 mg total) by mouth daily.  Dispense: 30 tablet; Refill: 0 - D/c all Nsaids  Shirline Frees, NP

## 2016-05-05 ENCOUNTER — Encounter: Payer: Self-pay | Admitting: Gastroenterology

## 2016-06-15 ENCOUNTER — Encounter (HOSPITAL_COMMUNITY): Payer: Self-pay | Admitting: Internal Medicine

## 2016-07-28 ENCOUNTER — Encounter (HOSPITAL_COMMUNITY): Payer: Self-pay | Admitting: Internal Medicine

## 2016-07-28 ENCOUNTER — Ambulatory Visit (HOSPITAL_COMMUNITY)
Admission: RE | Admit: 2016-07-28 | Discharge: 2016-07-28 | Disposition: A | Discharge status: Home / Self Care | Payer: 59 | Source: Ambulatory Visit | Attending: Internal Medicine | Admitting: Internal Medicine

## 2016-07-28 ENCOUNTER — Encounter (HOSPITAL_COMMUNITY): Payer: Self-pay | Admitting: *Deleted

## 2016-07-28 VITALS — BP 130/76 | HR 84 | Wt 240.5 lb

## 2016-07-28 DIAGNOSIS — E119 Type 2 diabetes mellitus without complications: Secondary | ICD-10-CM | POA: Insufficient documentation

## 2016-07-28 DIAGNOSIS — I5022 Chronic systolic (congestive) heart failure: Secondary | ICD-10-CM | POA: Diagnosis not present

## 2016-07-28 DIAGNOSIS — I110 Hypertensive heart disease with heart failure: Secondary | ICD-10-CM | POA: Diagnosis not present

## 2016-07-28 DIAGNOSIS — R0789 Other chest pain: Secondary | ICD-10-CM | POA: Diagnosis not present

## 2016-07-28 DIAGNOSIS — E785 Hyperlipidemia, unspecified: Secondary | ICD-10-CM | POA: Insufficient documentation

## 2016-07-28 DIAGNOSIS — Z841 Family history of disorders of kidney and ureter: Secondary | ICD-10-CM | POA: Diagnosis not present

## 2016-07-28 DIAGNOSIS — G4733 Obstructive sleep apnea (adult) (pediatric): Secondary | ICD-10-CM

## 2016-07-28 DIAGNOSIS — E669 Obesity, unspecified: Secondary | ICD-10-CM | POA: Insufficient documentation

## 2016-07-28 DIAGNOSIS — Z7982 Long term (current) use of aspirin: Secondary | ICD-10-CM | POA: Diagnosis not present

## 2016-07-28 DIAGNOSIS — Z7984 Long term (current) use of oral hypoglycemic drugs: Secondary | ICD-10-CM | POA: Insufficient documentation

## 2016-07-28 DIAGNOSIS — Z8249 Family history of ischemic heart disease and other diseases of the circulatory system: Secondary | ICD-10-CM | POA: Diagnosis not present

## 2016-07-28 DIAGNOSIS — Z6835 Body mass index (BMI) 35.0-35.9, adult: Secondary | ICD-10-CM | POA: Diagnosis not present

## 2016-07-28 DIAGNOSIS — I429 Cardiomyopathy, unspecified: Secondary | ICD-10-CM | POA: Insufficient documentation

## 2016-07-28 DIAGNOSIS — N529 Male erectile dysfunction, unspecified: Secondary | ICD-10-CM | POA: Diagnosis not present

## 2016-07-28 DIAGNOSIS — I10 Essential (primary) hypertension: Secondary | ICD-10-CM | POA: Diagnosis not present

## 2016-07-28 DIAGNOSIS — Z8673 Personal history of transient ischemic attack (TIA), and cerebral infarction without residual deficits: Secondary | ICD-10-CM | POA: Diagnosis not present

## 2016-07-28 DIAGNOSIS — E876 Hypokalemia: Secondary | ICD-10-CM | POA: Insufficient documentation

## 2016-07-28 DIAGNOSIS — Z800 Family history of malignant neoplasm of digestive organs: Secondary | ICD-10-CM | POA: Diagnosis not present

## 2016-07-28 LAB — CBC
HEMATOCRIT: 39.7 % (ref 39.0–52.0)
Hemoglobin: 13.2 g/dL (ref 13.0–17.0)
MCH: 30 pg (ref 26.0–34.0)
MCHC: 33.2 g/dL (ref 30.0–36.0)
MCV: 90.2 fL (ref 78.0–100.0)
PLATELETS: 199 10*3/uL (ref 150–400)
RBC: 4.4 MIL/uL (ref 4.22–5.81)
RDW: 13.6 % (ref 11.5–15.5)
WBC: 5.1 10*3/uL (ref 4.0–10.5)

## 2016-07-28 LAB — BASIC METABOLIC PANEL
ANION GAP: 8 (ref 5–15)
BUN: 11 mg/dL (ref 6–20)
CO2: 27 mmol/L (ref 22–32)
Calcium: 9.4 mg/dL (ref 8.9–10.3)
Chloride: 101 mmol/L (ref 101–111)
Creatinine, Ser: 0.9 mg/dL (ref 0.61–1.24)
GFR calc Af Amer: >60 mL/min (ref >60)
GLUCOSE: 116 mg/dL — AB (ref 65–99)
POTASSIUM: 3.6 mmol/L (ref 3.5–5.1)
Sodium: 136 mmol/L (ref 135–145)

## 2016-07-28 LAB — PROTIME-INR
INR: 0.86
Prothrombin Time: 11.7 seconds (ref 11.4–15.2)

## 2016-07-28 NOTE — Progress Notes (Signed)
Patient ID: Caleb Garcia, male   DOB: April 03, 1966, 51 y.o.   MRN: [id number]     Advanced Heart Failure Clinic Note   PCP: Shirline Frees, NP [facility]) HF: Dr. Gala Romney   HPI: Caleb Garcia is a 51 y.o. male with h/o HTN, obesity, OSA s/p UPPP, ? Familial NICM cardiomyopathy (suspect hypertensive), previous occipital stroke, and chronic systolic HF LVEF 91-69% by Echo 06/2014.  Cardiac catheterization on October 08, 2008 showed no significant coronary artery disease with EF 25-30%. Previously told he had very mild OSA with AHI 8. Saw Dr. Shelle Iron and decided to keep off CPAP for the time being.    Pt presents today for follow up. Not seen in HF clinic since 06/2014. States he has been feeling OK. States he has been having mild chest pressure. Occasionally happens with exertion but ot all the time. Gets better with rest. Pain can be associated with occasional "pins and needles" feeling in his hands. Some arthritis pain.  Denies claudication symptoms. 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 Denies SOB, orthopnea, or PND. Denies edema.   ECHO 10/2008 25-30%  ECHO 02/2009 EF 35-40%  ECHO 04/2013 EF 35-40% ECHO 06/18/2014: EF 45-50%     SH: Lives at home with wife. Has alcohol on occasion. No drug use. No smoking FH: Brother has HTN        Mom kidney transplant Dad throat cancer  Review of systems complete and found to be negative unless listed in HPI.    Past Medical History:  Diagnosis Date  . Cardiomyopathy    Non-ischemic  . CHF (congestive heart failure) (HCC)   . Diabetes mellitus without complication (HCC)   . HTN (hypertension)   . Hyperlipidemia   . Hypokalemia   . Obesity   . OSA (obstructive sleep apnea)   . Sleep apnea    had surgery- no cpap  . Systolic CHF [facility])      Current Outpatient Prescriptions  Medication Sig Dispense Refill  . aspirin 81 MG tablet Take 81 mg by mouth daily.      Marland Kitchen atorvastatin (LIPITOR) 20 MG tablet Take 1 tablet (20 mg total)  by mouth daily. 90 tablet 0  . carvedilol (COREG) 25 MG tablet Take 1 tablet (25 mg total) by mouth 2 (two) times daily with a meal. 180 tablet 0  . felodipine (PLENDIL) 2.5 MG 24 hr tablet Take 1 tablet (2.5 mg total) by mouth daily. 90 tablet 0  . furosemide (LASIX) 40 MG tablet Take 1 tablet (40 mg total) by mouth daily. 90 tablet 0  . hydrALAZINE (APRESOLINE) 25 MG tablet Take 1 tablet (25 mg total) by mouth 3 (three) times daily. 270 tablet 0  . lisinopril (PRINIVIL,ZESTRIL) 40 MG tablet Take 1 tablet (40 mg total) by mouth daily. 90 tablet 0  . meloxicam (MOBIC) 7.5 MG tablet Take 1 tablet (7.5 mg total) by mouth daily. 30 tablet 0  . metFORMIN (GLUCOPHAGE) 500 MG tablet Take 1 tablet (500 mg total) by mouth 2 (two) times daily with a meal. 180 tablet 0  . potassium chloride (K-DUR) 10 MEQ tablet Take 1 tablet (10 mEq total) by mouth daily. 90 tablet 0  . spironolactone (ALDACTONE) 25 MG tablet Take 2 tablets (50 mg total) by mouth daily. 180 tablet 0  . tadalafil (CIALIS) 20 MG tablet Take 1 tablet (20 mg total) by mouth daily as needed for erectile dysfunction. 10 tablet 3   No current facility-administered medications for  this encounter.     Vitals:   07/28/16 1346  BP: 130/76  Pulse: 84  SpO2: 100%  Weight: 240 lb 8 oz (109.1 kg)   Wt Readings from Last 3 Encounters:  07/28/16 240 lb 8 oz (109.1 kg)  05/04/16 253 lb 1.6 oz (114.8 kg)  04/17/16 242 lb (109.8 kg)     PHYSICAL EXAM: General: Well appearing male in NAD. No resp difficulty. HEENT: normal Neck: supple. JVD 7-8 cm. Carotids 2+ bilat; no bruits. No thyromegaly or nodule noted. Cor: PMI nondisplaced. RRR, No M/G/R noted Lungs: CTAB, normal effort. Abdomen: soft, non-tender, distended, no HSM. No bruits or masses. +BS  Extremities: no cyanosis, clubbing, or rash. No edema bilaterally.  Neuro: alert & orientedx3, cranial nerves grossly intact. moves all 4 extremities w/o difficulty. Affect pleasant   EKG: NSR  with inferolateral TWI (chronic)  ASSESSMENT & PLAN:  1) Chest discomfort - Chest discomfort with typical and atypical features but seems to have at least mild exertional component.  - Discussed stress test vs going straight to LHC.  - Will plan for LHC 2) Chronic systolic HF: NICM, Echo 06/2014 EF 45-50%, RV normal.  NYHA 1 symptoms, but symptoms concerning for angina.  - Volume status stable on exam. Continue lasix 40 mg daily.  - Continue coreg 25 mg BID - Continue lisinopril 40 mg daily. - Continue hydralazine 25 mg TID - Continue spiro 50 mg daily - He is no longer using viagra. So could consider nitrate, but previously intolerant due to headaches.  - Reinforced fluid restriction to < 2 L daily, sodium restriction to less than 2000 mg daily, and the importance of daily weights.   - Due for repeat Echo.  3) Obesity - Discussed importance of exercise and watching portion size.  4) HTN - Mildly elevated but relatively stable. Meds as above.  5) OSA  - Mild on AHA scale. Has not been CPAP per Dr. Shelle Iron.  6) Erectile dysfunction- - Not using cialis. Per PCP   We discussed risks and benefits of cardiac catheterization including risk of bleeding, infection, MI, or stroke. Pt aware and willing to proceed.   Graciella Freer, PA-C  07/28/16   Patient seen and examined with the above-signed Advanced Practice Provider and/or Housestaff. I personally reviewed laboratory data, imaging studies and relevant notes. I independently examined the patient and formulated the important aspects of the plan. I have edited the note to reflect any of my changes or salient points. I have personally discussed the plan with the patient and/or family.  HF appears well compensated with near full recovery of LV function based on echo in 3/16. Will repeat. Continue current HF regimen.   Main issue today is episodic CP over past few weeks. Has both typical and atypical features with some exertional  component. ECG abnormal but relatively unchanged. We discussed cath versus stress test and he has elected for cath. Will proceed in next few days.   I have reviewed the risks, indications, and alternatives to angioplasty and stenting with the patient. Risks include but are not limited to bleeding, infection, vascular injury, stroke, myocardial infection, arrhythmia, kidney injury, radiation-related injury in the case of prolonged fluoroscopy use, emergency cardiac surgery, and death. The patient understands the risks of serious complication is low (<1%) and he agrees to proceed.   Arvilla Meres, MD  11:16 PM

## 2016-07-28 NOTE — Patient Instructions (Signed)
Labs today  Your physician has requested that you have an echocardiogram. Echocardiography is a painless test that uses sound waves to create images of your heart. It provides your doctor with information about the size and shape of your heart and how well your heart's chambers and valves are working. This procedure takes approximately one hour. There are no restrictions for this procedure.  Your physician has requested that you have a cardiac catheterization. Cardiac catheterization is used to diagnose and/or treat various heart conditions. Doctors may recommend this procedure for a number of different reasons. The most common reason is to evaluate chest pain. Chest pain can be a symptom of coronary artery disease (CAD), and cardiac catheterization can show whether plaque is narrowing or blocking your heart's arteries. This procedure is also used to evaluate the valves, as well as measure the blood flow and oxygen levels in different parts of your heart. For further information please visit [email or web address] Please follow instruction sheet, as given.  We will contact you in 6 months to schedule your next appointment.

## 2016-07-29 ENCOUNTER — Ambulatory Visit (INDEPENDENT_AMBULATORY_CARE_PROVIDER_SITE_OTHER): Payer: 59 | Admitting: Physician Assistant

## 2016-07-29 ENCOUNTER — Other Ambulatory Visit (HOSPITAL_COMMUNITY): Payer: Self-pay | Admitting: *Deleted

## 2016-07-29 ENCOUNTER — Ambulatory Visit (INDEPENDENT_AMBULATORY_CARE_PROVIDER_SITE_OTHER): Payer: 59

## 2016-07-29 VITALS — BP 132/82 | HR 80 | Temp 98.2°F | Resp 18 | Ht 69.5 in | Wt 239.2 lb

## 2016-07-29 DIAGNOSIS — B351 Tinea unguium: Secondary | ICD-10-CM

## 2016-07-29 DIAGNOSIS — R234 Changes in skin texture: Secondary | ICD-10-CM | POA: Diagnosis not present

## 2016-07-29 DIAGNOSIS — M25561 Pain in right knee: Secondary | ICD-10-CM

## 2016-07-29 DIAGNOSIS — R0789 Other chest pain: Secondary | ICD-10-CM

## 2016-07-29 DIAGNOSIS — G8929 Other chronic pain: Secondary | ICD-10-CM

## 2016-07-29 LAB — POCT SKIN KOH: Skin KOH, POC: POSITIVE — AB

## 2016-07-29 MED ORDER — BUTENAFINE HCL 1 % EX CREA: 1.0000 "application " | TOPICAL_CREAM | Freq: Two times a day (BID) | CUTANEOUS | 0 refills | Status: DC

## 2016-07-29 MED ORDER — DICLOFENAC SODIUM 1 % TD GEL: 4.0000 g | Freq: Four times a day (QID) | TRANSDERMAL | 0 refills | Status: DC

## 2016-07-29 NOTE — Patient Instructions (Addendum)
Do not use hydrogen peroxide soaks at this time.  Wash in between the digits then thoroughly dry in between toes.  Apply the butenafine.   I would like you to ice the knee three times per day for 15 minutes.  Rest the knee as much as possible.  Place a pillow under the knee and lower leg.   You may take tylenol, 1000mg  every 8 hours as needed, with food Await contact with orthopedic visit.     IF you received an x-ray today, you will receive an invoice from [facility]. Please contact [facility] at [phone] with questions or concerns regarding your invoice.   IF you received labwork today, you will receive an invoice from [facility]. Please contact [facility] at [phone] with questions or concerns regarding your invoice.   Our billing staff will not be able to assist you with questions regarding bills from these companies.  You will be contacted with the lab results as soon as they are available. The fastest way to get your results is to activate your My Chart account. Instructions are located on the last page of this paperwork. If you have not heard from [place] regarding the results in 2 weeks, please contact this office.

## 2016-07-29 NOTE — Progress Notes (Signed)
PRIMARY CARE AT [facility] [address] [place] [address] [phone]  Date:  07/29/2016   Name:  Caleb Garcia   DOB:  December 01, 1965   MRN:  [id number]  PCP:  Shirline Frees, NP    History of Present Illness:  Caleb Garcia is a 51 y.o. male patient who presents to PCP with  Chief Complaint  Patient presents with  . Laceration    cut between left toes and is a diabetic  . Knee Pain    x38months      Patient reports chronic knee pain for over 6 months, that feels like it is worsening.  It is a sharp pain along the entire knee.  Worse in the morning, but improves with time.  He has noticed increased right knee swelling and some warmth associated.  No knee brace.   Driver work occupation, and gets out to move heavy equipment.   No sob, dyspnea, coughing, or fever.    Patient Active Problem List   Diagnosis Date Noted  . Chest discomfort 07/28/2016  . Controlled type 2 diabetes mellitus without complication (HCC) 11/26/2013  . Bilateral shoulder pain 10/31/2013  . Breast tenderness in male 10/31/2013  . Erectile dysfunction 04/18/2013  . Mixed hyperlipidemia 11/26/2009  . LIMB PAIN 07/23/2009  . GERD 04/29/2009  . HYPOKALEMIA 10/22/2008  . Obesity 10/22/2008  . OBSTRUCTIVE SLEEP APNEA 10/22/2008  . Essential hypertension 10/22/2008  . CARDIOMYOPATHY 10/22/2008  . CONGESTIVE HEART FAILURE, MILD 10/22/2008  . Chronic systolic heart failure (HCC) 10/16/2008    Past Medical History:  Diagnosis Date  . Cardiomyopathy    Non-ischemic  . CHF (congestive heart failure) (HCC)   . Diabetes mellitus without complication (HCC)   . HTN (hypertension)   . Hyperlipidemia   . Hypokalemia   . Obesity   . OSA (obstructive sleep apnea)   . Sleep apnea    had surgery- no cpap  . Systolic CHF [facility])     Past Surgical History:  Procedure Laterality Date  . avulsion fracture of left hip    . CARDIAC CATHETERIZATION  10/08/08  . TONSILLECTOMY    . UVULOPALATOPHARYNGOPLASTY       Social History  Substance Use Topics  . Smoking status: Never Smoker  . Smokeless tobacco: Never Used  . Alcohol use No    Family History  Problem Relation Age of Onset  . Diabetes Mother   . Hypertension Mother   . Heart disease Mother   . Cancer Father     Throat cancer  . Stroke Father   . Prostate cancer Neg Hx   . Colon cancer Neg Hx   . Colon polyps Neg Hx   . Esophageal cancer Neg Hx   . Rectal cancer Neg Hx   . Stomach cancer Neg Hx     Allergies  Allergen Reactions  . Shrimp [Shellfish Allergy]     Medication list has been reviewed and updated.  Current Outpatient Prescriptions on File Prior to Visit  Medication Sig Dispense Refill  . aspirin 81 MG tablet Take 81 mg by mouth daily.      Marland Kitchen atorvastatin (LIPITOR) 20 MG tablet Take 1 tablet (20 mg total) by mouth daily. 90 tablet 0  . carvedilol (COREG) 25 MG tablet Take 1 tablet (25 mg total) by mouth 2 (two) times daily with a meal. 180 tablet 0  . felodipine (PLENDIL) 2.5 MG 24 hr tablet Take 1 tablet (2.5 mg total) by mouth daily. 90 tablet 0  .  furosemide (LASIX) 40 MG tablet Take 1 tablet (40 mg total) by mouth daily. 90 tablet 0  . hydrALAZINE (APRESOLINE) 25 MG tablet Take 1 tablet (25 mg total) by mouth 3 (three) times daily. 270 tablet 0  . lisinopril (PRINIVIL,ZESTRIL) 40 MG tablet Take 1 tablet (40 mg total) by mouth daily. 90 tablet 0  . meloxicam (MOBIC) 7.5 MG tablet Take 1 tablet (7.5 mg total) by mouth daily. 30 tablet 0  . metFORMIN (GLUCOPHAGE) 500 MG tablet Take 1 tablet (500 mg total) by mouth 2 (two) times daily with a meal. 180 tablet 0  . potassium chloride (K-DUR) 10 MEQ tablet Take 1 tablet (10 mEq total) by mouth daily. 90 tablet 0  . spironolactone (ALDACTONE) 25 MG tablet Take 2 tablets (50 mg total) by mouth daily. 180 tablet 0  . tadalafil (CIALIS) 20 MG tablet Take 1 tablet (20 mg total) by mouth daily as needed for erectile dysfunction. 10 tablet 3   No current  facility-administered medications on file prior to visit.     ROS ROS otherwise unremarkable unless listed above.  Physical Examination: BP 132/82   Pulse 80   Temp 98.2 F (36.8 C) (Oral)   Resp 18   Ht 5' 9.5" (1.765 m)   Wt 239 lb 3.2 oz (108.5 kg)   SpO2 99%   BMI 34.82 kg/m  Ideal Body Weight: Weight in (lb) to have BMI = 25: 171.4  Physical Exam  Constitutional: He is oriented to person, place, and time. He appears well-developed and well-nourished. No distress.  HENT:  Head: Normocephalic and atraumatic.  Eyes: Conjunctivae and EOM are normal. Pupils are equal, round, and reactive to light.  Cardiovascular: Normal rate.   Pulmonary/Chest: Effort normal. No respiratory distress.  Musculoskeletal:       Right knee: He exhibits abnormal meniscus. He exhibits normal range of motion, no swelling, no LCL laxity and no MCL laxity. Tenderness found. MCL tenderness noted.  Neurological: He is alert and oriented to person, place, and time.  Skin: Skin is warm and dry. He is not diaphoretic.  Left foot with peeling moist white thickening at the interdigit.  Fissure spotted at the interdigit of 3rd and 4th digit.  Tender upon palpation. Toenail discoloration and nail thickening consistent with onychomycosis.  Psychiatric: He has a normal mood and affect. His behavior is normal.   Results for orders placed or performed in visit on 07/29/16  POCT Skin KOH  Result Value Ref Range   Skin KOH, POC Positive (A) Negative    Dg Knee Complete 4 Views Right  Result Date: 07/29/2016 CLINICAL DATA:  Chronic six-month history of right knee pain. Recent swelling. Focal tenderness along the inferior patella. No recent injuries. EXAM: RIGHT KNEE - COMPLETE 4+ VIEW COMPARISON:  02/29/2016. FINDINGS: Mild joint space narrowing with associated minimal hypertrophic spurring involving the medial and lateral compartments, unchanged. No evidence of acute or subacute fracture or dislocation. Small  enthesopathic spur at the insertion of the quadriceps tendon on the superior patella. Well-preserved bone mineral density. Subchondral lucency involving the medial femoral condyle visualized on the [place] view. Possible small joint effusion. IMPRESSION: 1. Possible osteochondritis dissecans involving the medial femoral condyle. MRI of the knee without contrast may be helpful to confirm or deny this finding. 2. Stable mild osteoarthritis involving the medial and lateral compartments. Electronically Signed   By: Hulan Saas M.D.   On: 07/29/2016 09:26     Assessment and Plan: Caleb Garcia is a  51 y.o. male who is here today for fissure of toe, and right knee pain. Advised voltaren gel and given a brace today for this chronic knee pain.  Likely due to degenerative changes but possible meniscal tear could be culprit.   Fissure: treating skin with butenafine, and advised to wash and keep dry.  Discontinue the hydrogen peroxide soaks.  Statin therapy with catheterization in 1 month.  Avoiding oral treatment of fungal infection at this time.     Chronic pain of right knee - Plan: AMB referral to orthopedics, diclofenac sodium (VOLTAREN) 1 % GEL, DG Knee Complete 4 Views Right, Butenafine HCl 1 % cream  Fissure in skin of foot - Plan: POCT Skin KOH, Butenafine HCl 1 % cream  Trena Platt, PA-C [facility] 4/27/201811:10 AM

## 2016-08-03 ENCOUNTER — Ambulatory Visit (HOSPITAL_COMMUNITY)
Admission: RE | Admit: 2016-08-03 | Discharge: 2016-08-03 | Disposition: A | Discharge status: Home / Self Care | Payer: 59 | Source: Ambulatory Visit | Attending: Internal Medicine | Admitting: Internal Medicine

## 2016-08-03 ENCOUNTER — Encounter (HOSPITAL_COMMUNITY): Payer: Self-pay | Admitting: Internal Medicine

## 2016-08-03 ENCOUNTER — Encounter (HOSPITAL_COMMUNITY)
Admission: RE | Disposition: A | Discharge status: Home / Self Care | Payer: Self-pay | Source: Ambulatory Visit | Attending: Internal Medicine

## 2016-08-03 DIAGNOSIS — Z7982 Long term (current) use of aspirin: Secondary | ICD-10-CM | POA: Insufficient documentation

## 2016-08-03 DIAGNOSIS — R079 Chest pain, unspecified: Secondary | ICD-10-CM

## 2016-08-03 DIAGNOSIS — N529 Male erectile dysfunction, unspecified: Secondary | ICD-10-CM | POA: Insufficient documentation

## 2016-08-03 DIAGNOSIS — Z6834 Body mass index (BMI) 34.0-34.9, adult: Secondary | ICD-10-CM | POA: Insufficient documentation

## 2016-08-03 DIAGNOSIS — E119 Type 2 diabetes mellitus without complications: Secondary | ICD-10-CM | POA: Insufficient documentation

## 2016-08-03 DIAGNOSIS — I428 Other cardiomyopathies: Secondary | ICD-10-CM | POA: Diagnosis not present

## 2016-08-03 DIAGNOSIS — E876 Hypokalemia: Secondary | ICD-10-CM | POA: Insufficient documentation

## 2016-08-03 DIAGNOSIS — Z8673 Personal history of transient ischemic attack (TIA), and cerebral infarction without residual deficits: Secondary | ICD-10-CM | POA: Insufficient documentation

## 2016-08-03 DIAGNOSIS — E785 Hyperlipidemia, unspecified: Secondary | ICD-10-CM | POA: Diagnosis not present

## 2016-08-03 DIAGNOSIS — R0789 Other chest pain: Secondary | ICD-10-CM | POA: Insufficient documentation

## 2016-08-03 DIAGNOSIS — Z7984 Long term (current) use of oral hypoglycemic drugs: Secondary | ICD-10-CM | POA: Diagnosis not present

## 2016-08-03 DIAGNOSIS — I110 Hypertensive heart disease with heart failure: Secondary | ICD-10-CM | POA: Diagnosis not present

## 2016-08-03 DIAGNOSIS — E669 Obesity, unspecified: Secondary | ICD-10-CM | POA: Insufficient documentation

## 2016-08-03 DIAGNOSIS — G4733 Obstructive sleep apnea (adult) (pediatric): Secondary | ICD-10-CM | POA: Diagnosis not present

## 2016-08-03 DIAGNOSIS — I5022 Chronic systolic (congestive) heart failure: Secondary | ICD-10-CM | POA: Insufficient documentation

## 2016-08-03 HISTORY — PX: LEFT HEART CATH AND CORONARY ANGIOGRAPHY: CATH118249

## 2016-08-03 LAB — GLUCOSE, CAPILLARY: Glucose-Capillary: 112 mg/dL — ABNORMAL HIGH (ref 65–99)

## 2016-08-03 SURGERY — LEFT HEART CATH AND CORONARY ANGIOGRAPHY
Anesthesia: LOCAL

## 2016-08-03 MED ORDER — LIDOCAINE HCL (PF) 1 % IJ SOLN
INTRAMUSCULAR | Status: AC
  Filled 2016-08-03: qty 30

## 2016-08-03 MED ORDER — SODIUM CHLORIDE 0.9 % IV SOLN: INTRAVENOUS | Status: AC

## 2016-08-03 MED ORDER — ONDANSETRON HCL 4 MG/2ML IJ SOLN: 4.0000 mg | Freq: Four times a day (QID) | INTRAMUSCULAR | Status: DC | PRN

## 2016-08-03 MED ORDER — HEPARIN (PORCINE) IN NACL 2-0.9 UNIT/ML-% IJ SOLN
INTRAMUSCULAR | Status: DC | PRN
  Administered 2016-08-03: 1000 mL

## 2016-08-03 MED ORDER — FENTANYL CITRATE (PF) 100 MCG/2ML IJ SOLN
INTRAMUSCULAR | Status: AC
  Filled 2016-08-03: qty 2

## 2016-08-03 MED ORDER — SODIUM CHLORIDE 0.9 % IV SOLN: 250.0000 mL | INTRAVENOUS | Status: DC | PRN

## 2016-08-03 MED ORDER — SODIUM CHLORIDE 0.9% FLUSH: 3.0000 mL | INTRAVENOUS | Status: DC | PRN

## 2016-08-03 MED ORDER — SODIUM CHLORIDE 0.9% FLUSH: 3.0000 mL | Freq: Two times a day (BID) | INTRAVENOUS | Status: DC

## 2016-08-03 MED ORDER — IOPAMIDOL (ISOVUE-370) INJECTION 76%
INTRAVENOUS | Status: DC | PRN
  Administered 2016-08-03: 35 mL via INTRA_ARTERIAL

## 2016-08-03 MED ORDER — ACETAMINOPHEN 325 MG PO TABS: 650.0000 mg | ORAL_TABLET | ORAL | Status: DC | PRN

## 2016-08-03 MED ORDER — FENTANYL CITRATE (PF) 100 MCG/2ML IJ SOLN
INTRAMUSCULAR | Status: DC | PRN
  Administered 2016-08-03: 25 ug via INTRAVENOUS

## 2016-08-03 MED ORDER — ASPIRIN 81 MG PO CHEW
CHEWABLE_TABLET | ORAL | Status: AC
  Filled 2016-08-03: qty 1

## 2016-08-03 MED ORDER — SODIUM CHLORIDE 0.9 % IV SOLN
INTRAVENOUS | Status: DC
  Administered 2016-08-03: 06:00:00 via INTRAVENOUS

## 2016-08-03 MED ORDER — HEPARIN SODIUM (PORCINE) 1000 UNIT/ML IJ SOLN
INTRAMUSCULAR | Status: AC
  Filled 2016-08-03: qty 1

## 2016-08-03 MED ORDER — HEPARIN (PORCINE) IN NACL 2-0.9 UNIT/ML-% IJ SOLN
INTRAMUSCULAR | Status: AC
  Filled 2016-08-03: qty 1000

## 2016-08-03 MED ORDER — MIDAZOLAM HCL 2 MG/2ML IJ SOLN
INTRAMUSCULAR | Status: AC
  Filled 2016-08-03: qty 2

## 2016-08-03 MED ORDER — HEPARIN SODIUM (PORCINE) 1000 UNIT/ML IJ SOLN
INTRAMUSCULAR | Status: DC | PRN
  Administered 2016-08-03: 5000 [IU] via INTRAVENOUS

## 2016-08-03 MED ORDER — VERAPAMIL HCL 2.5 MG/ML IV SOLN
INTRAVENOUS | Status: DC | PRN
  Administered 2016-08-03: 10 mL via INTRA_ARTERIAL

## 2016-08-03 MED ORDER — ASPIRIN 81 MG PO CHEW
81.0000 mg | CHEWABLE_TABLET | ORAL | Status: AC
  Administered 2016-08-03: 81 mg via ORAL

## 2016-08-03 MED ORDER — VERAPAMIL HCL 2.5 MG/ML IV SOLN
INTRAVENOUS | Status: AC
  Filled 2016-08-03: qty 2

## 2016-08-03 MED ORDER — MIDAZOLAM HCL 2 MG/2ML IJ SOLN
INTRAMUSCULAR | Status: DC | PRN
  Administered 2016-08-03: 1 mg via INTRAVENOUS

## 2016-08-03 MED ORDER — IOPAMIDOL (ISOVUE-370) INJECTION 76%
INTRAVENOUS | Status: AC
  Filled 2016-08-03: qty 100

## 2016-08-03 MED ORDER — LIDOCAINE HCL (PF) 1 % IJ SOLN
INTRAMUSCULAR | Status: DC | PRN
  Administered 2016-08-03: 2 mL

## 2016-08-03 SURGICAL SUPPLY — 10 items
CATH 5FR JL3.5 JR4 ANG PIG MP (CATHETERS) ×2 IMPLANT
DEVICE RAD COMP TR BAND LRG (VASCULAR PRODUCTS) ×2 IMPLANT
GLIDESHEATH SLEND SS 6F .021 (SHEATH) ×2 IMPLANT
GUIDEWIRE INQWIRE 1.5J.035X260 (WIRE) ×1 IMPLANT
INQWIRE 1.5J .035X260CM (WIRE) ×2
KIT HEART LEFT (KITS) ×2 IMPLANT
PACK CARDIAC CATHETERIZATION (CUSTOM PROCEDURE TRAY) ×2 IMPLANT
TRANSDUCER W/STOPCOCK (MISCELLANEOUS) ×2 IMPLANT
TUBING CIL FLEX 10 FLL-RA (TUBING) ×2 IMPLANT
WIRE HI TORQ VERSACORE-J 145CM (WIRE) ×2 IMPLANT

## 2016-08-03 NOTE — H&P (View-Only) (Signed)
Patient ID: Caleb Garcia, male   DOB: April 03, 1966, 51 y.o.   MRN: [id number]     Advanced Heart Failure Clinic Note   PCP: Shirline Frees, NP [facility]) HF: Dr. Gala Romney   HPI: Caleb Garcia is a 51 y.o. male with h/o HTN, obesity, OSA s/p UPPP, ? Familial NICM cardiomyopathy (suspect hypertensive), previous occipital stroke, and chronic systolic HF LVEF 91-69% by Echo 06/2014.  Cardiac catheterization on October 08, 2008 showed no significant coronary artery disease with EF 25-30%. Previously told he had very mild OSA with AHI 8. Saw Dr. Shelle Iron and decided to keep off CPAP for the time being.    Pt presents today for follow up. Not seen in HF clinic since 06/2014. States he has been feeling OK. States he has been having mild chest pressure. Occasionally happens with exertion but ot all the time. Gets better with rest. Pain can be associated with occasional "pins and needles" feeling in his hands. Some arthritis pain.  Denies claudication symptoms. 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 Denies SOB, orthopnea, or PND. Denies edema.   ECHO 10/2008 25-30%  ECHO 02/2009 EF 35-40%  ECHO 04/2013 EF 35-40% ECHO 06/18/2014: EF 45-50%     SH: Lives at home with wife. Has alcohol on occasion. No drug use. No smoking FH: Brother has HTN        Mom kidney transplant Dad throat cancer  Review of systems complete and found to be negative unless listed in HPI.    Past Medical History:  Diagnosis Date  . Cardiomyopathy    Non-ischemic  . CHF (congestive heart failure) (HCC)   . Diabetes mellitus without complication (HCC)   . HTN (hypertension)   . Hyperlipidemia   . Hypokalemia   . Obesity   . OSA (obstructive sleep apnea)   . Sleep apnea    had surgery- no cpap  . Systolic CHF [facility])      Current Outpatient Prescriptions  Medication Sig Dispense Refill  . aspirin 81 MG tablet Take 81 mg by mouth daily.      Marland Kitchen atorvastatin (LIPITOR) 20 MG tablet Take 1 tablet (20 mg total)  by mouth daily. 90 tablet 0  . carvedilol (COREG) 25 MG tablet Take 1 tablet (25 mg total) by mouth 2 (two) times daily with a meal. 180 tablet 0  . felodipine (PLENDIL) 2.5 MG 24 hr tablet Take 1 tablet (2.5 mg total) by mouth daily. 90 tablet 0  . furosemide (LASIX) 40 MG tablet Take 1 tablet (40 mg total) by mouth daily. 90 tablet 0  . hydrALAZINE (APRESOLINE) 25 MG tablet Take 1 tablet (25 mg total) by mouth 3 (three) times daily. 270 tablet 0  . lisinopril (PRINIVIL,ZESTRIL) 40 MG tablet Take 1 tablet (40 mg total) by mouth daily. 90 tablet 0  . meloxicam (MOBIC) 7.5 MG tablet Take 1 tablet (7.5 mg total) by mouth daily. 30 tablet 0  . metFORMIN (GLUCOPHAGE) 500 MG tablet Take 1 tablet (500 mg total) by mouth 2 (two) times daily with a meal. 180 tablet 0  . potassium chloride (K-DUR) 10 MEQ tablet Take 1 tablet (10 mEq total) by mouth daily. 90 tablet 0  . spironolactone (ALDACTONE) 25 MG tablet Take 2 tablets (50 mg total) by mouth daily. 180 tablet 0  . tadalafil (CIALIS) 20 MG tablet Take 1 tablet (20 mg total) by mouth daily as needed for erectile dysfunction. 10 tablet 3   No current facility-administered medications for  this encounter.     Vitals:   07/28/16 1346  BP: 130/76  Pulse: 84  SpO2: 100%  Weight: 240 lb 8 oz (109.1 kg)   Wt Readings from Last 3 Encounters:  07/28/16 240 lb 8 oz (109.1 kg)  05/04/16 253 lb 1.6 oz (114.8 kg)  04/17/16 242 lb (109.8 kg)     PHYSICAL EXAM: General: Well appearing male in NAD. No resp difficulty. HEENT: normal Neck: supple. JVD 7-8 cm. Carotids 2+ bilat; no bruits. No thyromegaly or nodule noted. Cor: PMI nondisplaced. RRR, No M/G/R noted Lungs: CTAB, normal effort. Abdomen: soft, non-tender, distended, no HSM. No bruits or masses. +BS  Extremities: no cyanosis, clubbing, or rash. No edema bilaterally.  Neuro: alert & orientedx3, cranial nerves grossly intact. moves all 4 extremities w/o difficulty. Affect pleasant   EKG: NSR  with inferolateral TWI (chronic)  ASSESSMENT & PLAN:  1) Chest discomfort - Chest discomfort with typical and atypical features but seems to have at least mild exertional component.  - Discussed stress test vs going straight to LHC.  - Will plan for LHC 2) Chronic systolic HF: NICM, Echo 06/2014 EF 45-50%, RV normal.  NYHA 1 symptoms, but symptoms concerning for angina.  - Volume status stable on exam. Continue lasix 40 mg daily.  - Continue coreg 25 mg BID - Continue lisinopril 40 mg daily. - Continue hydralazine 25 mg TID - Continue spiro 50 mg daily - He is no longer using viagra. So could consider nitrate, but previously intolerant due to headaches.  - Reinforced fluid restriction to < 2 L daily, sodium restriction to less than 2000 mg daily, and the importance of daily weights.   - Due for repeat Echo.  3) Obesity - Discussed importance of exercise and watching portion size.  4) HTN - Mildly elevated but relatively stable. Meds as above.  5) OSA  - Mild on AHA scale. Has not been CPAP per Dr. Shelle Iron.  6) Erectile dysfunction- - Not using cialis. Per PCP   We discussed risks and benefits of cardiac catheterization including risk of bleeding, infection, MI, or stroke. Pt aware and willing to proceed.   Graciella Freer, PA-C  07/28/16   Patient seen and examined with the above-signed Advanced Practice Provider and/or Housestaff. I personally reviewed laboratory data, imaging studies and relevant notes. I independently examined the patient and formulated the important aspects of the plan. I have edited the note to reflect any of my changes or salient points. I have personally discussed the plan with the patient and/or family.  HF appears well compensated with near full recovery of LV function based on echo in 3/16. Will repeat. Continue current HF regimen.   Main issue today is episodic CP over past few weeks. Has both typical and atypical features with some exertional  component. ECG abnormal but relatively unchanged. We discussed cath versus stress test and he has elected for cath. Will proceed in next few days.   I have reviewed the risks, indications, and alternatives to angioplasty and stenting with the patient. Risks include but are not limited to bleeding, infection, vascular injury, stroke, myocardial infection, arrhythmia, kidney injury, radiation-related injury in the case of prolonged fluoroscopy use, emergency cardiac surgery, and death. The patient understands the risks of serious complication is low (<1%) and he agrees to proceed.   Arvilla Meres, MD  11:16 PM

## 2016-08-03 NOTE — Discharge Instructions (Signed)
Start back on metformin on Friday     Radial Site Care Refer to this sheet in the next few weeks. These instructions provide you with information about caring for yourself after your procedure. Your health care provider may also give you more specific instructions. Your treatment has been planned according to current medical practices, but problems sometimes occur. Call your health care provider if you have any problems or questions after your procedure. What can I expect after the procedure? After your procedure, it is typical to have the following:  Bruising at the radial site that usually fades within 1-2 weeks.  Blood collecting in the tissue (hematoma) that may be painful to the touch. It should usually decrease in size and tenderness within 1-2 weeks. Follow these instructions at home:  Take medicines only as directed by your health care provider.  You may shower 24-48 hours after the procedure or as directed by your health care provider. Remove the bandage (dressing) and gently wash the site with plain soap and water. Pat the area dry with a clean towel. Do not rub the site, because this may cause bleeding.  Do not take baths, swim, or use a hot tub until your health care provider approves.  Check your insertion site every day for redness, swelling, or drainage.  Do not apply powder or lotion to the site.  Do not flex or bend the affected arm for 24 hours or as directed by your health care provider.  Do not push or pull heavy objects with the affected arm for 24 hours or as directed by your health care provider.  Do not lift over 10 lb (4.5 kg) for 5 days after your procedure or as directed by your health care provider.  Ask your health care provider when it is okay to:  Return to work or school.  Resume usual physical activities or sports.  Resume sexual activity.  Do not drive home if you are discharged the same day as the procedure. Have someone else drive you.  You  may drive 24 hours after the procedure unless otherwise instructed by your health care provider.  Do not operate machinery or power tools for 24 hours after the procedure.  If your procedure was done as an outpatient procedure, which means that you went home the same day as your procedure, a responsible adult should be with you for the first 24 hours after you arrive home.  Keep all follow-up visits as directed by your health care provider. This is important. Contact a health care provider if:  You have a fever.  You have chills.  You have increased bleeding from the radial site. Hold pressure on the site. Get help right away if:  You have unusual pain at the radial site.  You have redness, warmth, or swelling at the radial site.  You have drainage (other than a small amount of blood on the dressing) from the radial site.  The radial site is bleeding, and the bleeding does not stop after 30 minutes of holding steady pressure on the site.  Your arm or hand becomes pale, cool, tingly, or numb. This information is not intended to replace advice given to you by your health care provider. Make sure you discuss any questions you have with your health care provider. Document Released: 04/24/2010 Document Revised: 08/28/2015 Document Reviewed: 10/08/2013 Elsevier Interactive Patient Education  2017 [employer].

## 2016-08-03 NOTE — Interval H&P Note (Signed)
History and Physical Interval Note:  08/03/2016 8:14 AM  Caleb Garcia  has presented today for surgery, with the diagnosis of cp  The various methods of treatment have been discussed with the patient and family. After consideration of risks, benefits and other options for treatment, the patient has consented to  Procedure(s): Left Heart Cath and Coronary Angiography (N/A) and possible angioplasty as a surgical intervention .  The patient's history has been reviewed, patient examined, no change in status, stable for surgery.  I have reviewed the patient's chart and labs.  Questions were answered to the patient's satisfaction.     Bensimhon, Reuel Boom

## 2016-08-12 ENCOUNTER — Telehealth: Payer: Self-pay

## 2016-08-12 NOTE — Telephone Encounter (Signed)
Received a denial through cover my meds. [employer] and did an appeal over the phone. Received an approval for Voltaren gel from today until 08/13/2019. Patient aware.

## 2016-08-22 NOTE — Telephone Encounter (Signed)
I did review this message.  Thank you!!

## 2016-08-27 ENCOUNTER — Other Ambulatory Visit (HOSPITAL_COMMUNITY): Payer: Self-pay

## 2016-09-09 ENCOUNTER — Other Ambulatory Visit (HOSPITAL_COMMUNITY): Payer: Self-pay

## 2016-09-13 ENCOUNTER — Encounter: Payer: Self-pay | Admitting: Internal Medicine

## 2016-09-20 ENCOUNTER — Other Ambulatory Visit: Payer: Self-pay | Admitting: Adult Health

## 2016-09-20 DIAGNOSIS — Z760 Encounter for issue of repeat prescription: Secondary | ICD-10-CM

## 2016-09-21 NOTE — Telephone Encounter (Signed)
Please advise on refill.

## 2016-09-22 ENCOUNTER — Encounter (HOSPITAL_COMMUNITY): Payer: Self-pay | Admitting: *Deleted

## 2016-09-22 ENCOUNTER — Other Ambulatory Visit: Payer: Self-pay | Admitting: Adult Health

## 2016-09-22 NOTE — Telephone Encounter (Signed)
It looks like patient is seeing Trena Platt, at [place]. Please advise.

## 2016-09-22 NOTE — Telephone Encounter (Signed)
I am unsure if he has switched providers to [place]. Can we call him and find out? Thanks

## 2016-11-15 ENCOUNTER — Other Ambulatory Visit: Payer: Self-pay | Admitting: Adult Health

## 2016-11-15 DIAGNOSIS — Z760 Encounter for issue of repeat prescription: Secondary | ICD-10-CM

## 2016-11-15 DIAGNOSIS — I5022 Chronic systolic (congestive) heart failure: Secondary | ICD-10-CM

## 2016-11-16 NOTE — Telephone Encounter (Signed)
He is due for CPE at the end of Jan. Ok to refill medications until then

## 2016-11-16 NOTE — Telephone Encounter (Signed)
Sent to the pharmacy by e-scribe. 

## 2016-11-16 NOTE — Telephone Encounter (Signed)
When should pt return for follow up? 

## 2016-12-28 ENCOUNTER — Other Ambulatory Visit (HOSPITAL_COMMUNITY): Payer: Self-pay | Admitting: Internal Medicine

## 2017-02-22 ENCOUNTER — Other Ambulatory Visit: Payer: Self-pay | Admitting: Adult Health

## 2017-02-22 DIAGNOSIS — Z760 Encounter for issue of repeat prescription: Secondary | ICD-10-CM

## 2017-02-22 NOTE — Telephone Encounter (Signed)
Sent to the pharmacy by e-scribe.  Pt due for CPX 04/2017.

## 2017-03-25 ENCOUNTER — Encounter (HOSPITAL_COMMUNITY): Payer: Self-pay

## 2017-03-25 ENCOUNTER — Emergency Department (HOSPITAL_COMMUNITY)
Admission: EM | Admit: 2017-03-25 | Discharge: 2017-03-25 | Disposition: A | Discharge status: Home / Self Care | Payer: 59 | Attending: Emergency Medicine | Admitting: Emergency Medicine

## 2017-03-25 ENCOUNTER — Other Ambulatory Visit: Payer: Self-pay

## 2017-03-25 ENCOUNTER — Emergency Department (HOSPITAL_COMMUNITY): Payer: 59

## 2017-03-25 DIAGNOSIS — E119 Type 2 diabetes mellitus without complications: Secondary | ICD-10-CM | POA: Insufficient documentation

## 2017-03-25 DIAGNOSIS — Z7982 Long term (current) use of aspirin: Secondary | ICD-10-CM | POA: Diagnosis not present

## 2017-03-25 DIAGNOSIS — Z7984 Long term (current) use of oral hypoglycemic drugs: Secondary | ICD-10-CM | POA: Diagnosis not present

## 2017-03-25 DIAGNOSIS — I110 Hypertensive heart disease with heart failure: Secondary | ICD-10-CM | POA: Diagnosis not present

## 2017-03-25 DIAGNOSIS — R0789 Other chest pain: Secondary | ICD-10-CM | POA: Diagnosis not present

## 2017-03-25 DIAGNOSIS — R42 Dizziness and giddiness: Secondary | ICD-10-CM | POA: Diagnosis not present

## 2017-03-25 DIAGNOSIS — I5022 Chronic systolic (congestive) heart failure: Secondary | ICD-10-CM | POA: Diagnosis not present

## 2017-03-25 DIAGNOSIS — Z79899 Other long term (current) drug therapy: Secondary | ICD-10-CM | POA: Insufficient documentation

## 2017-03-25 LAB — COMPREHENSIVE METABOLIC PANEL
ALK PHOS: 33 U/L — AB (ref 38–126)
ALT: 19 U/L (ref 17–63)
ANION GAP: 7 (ref 5–15)
AST: 23 U/L (ref 15–41)
Albumin: 3.8 g/dL (ref 3.5–5.0)
BILIRUBIN TOTAL: 0.9 mg/dL (ref 0.3–1.2)
BUN: 11 mg/dL (ref 6–20)
CALCIUM: 9.1 mg/dL (ref 8.9–10.3)
CO2: 24 mmol/L (ref 22–32)
Chloride: 102 mmol/L (ref 101–111)
Creatinine, Ser: 0.99 mg/dL (ref 0.61–1.24)
GLUCOSE: 118 mg/dL — AB (ref 65–99)
Potassium: 4 mmol/L (ref 3.5–5.1)
Sodium: 133 mmol/L — ABNORMAL LOW (ref 135–145)
TOTAL PROTEIN: 7 g/dL (ref 6.5–8.1)

## 2017-03-25 LAB — I-STAT TROPONIN, ED: Troponin i, poc: 0 ng/mL (ref 0.00–0.08)

## 2017-03-25 LAB — I-STAT CHEM 8, ED
BUN: 11 mg/dL (ref 6–20)
CALCIUM ION: 1.15 mmol/L (ref 1.15–1.40)
CHLORIDE: 102 mmol/L (ref 101–111)
Creatinine, Ser: 0.9 mg/dL (ref 0.61–1.24)
GLUCOSE: 114 mg/dL — AB (ref 65–99)
HCT: 40 % (ref 39.0–52.0)
Hemoglobin: 13.6 g/dL (ref 13.0–17.0)
Potassium: 4.1 mmol/L (ref 3.5–5.1)
Sodium: 137 mmol/L (ref 135–145)
TCO2: 26 mmol/L (ref 22–32)

## 2017-03-25 LAB — CBC
HCT: 38.4 % — ABNORMAL LOW (ref 39.0–52.0)
HEMOGLOBIN: 12.8 g/dL — AB (ref 13.0–17.0)
MCH: 30.5 pg (ref 26.0–34.0)
MCHC: 33.3 g/dL (ref 30.0–36.0)
MCV: 91.4 fL (ref 78.0–100.0)
Platelets: 218 10*3/uL (ref 150–400)
RBC: 4.2 MIL/uL — AB (ref 4.22–5.81)
RDW: 13.1 % (ref 11.5–15.5)
WBC: 4.8 10*3/uL (ref 4.0–10.5)

## 2017-03-25 LAB — DIFFERENTIAL
Basophils Absolute: 0 10*3/uL (ref 0.0–0.1)
Basophils Relative: 0 %
EOS PCT: 2 %
Eosinophils Absolute: 0.1 10*3/uL (ref 0.0–0.7)
LYMPHS ABS: 2.5 10*3/uL (ref 0.7–4.0)
LYMPHS PCT: 52 %
MONO ABS: 0.3 10*3/uL (ref 0.1–1.0)
MONOS PCT: 7 %
Neutro Abs: 1.9 10*3/uL (ref 1.7–7.7)
Neutrophils Relative %: 39 %

## 2017-03-25 LAB — PROTIME-INR
INR: 0.95
Prothrombin Time: 12.6 seconds (ref 11.4–15.2)

## 2017-03-25 LAB — TROPONIN I

## 2017-03-25 LAB — APTT: aPTT: 28 seconds (ref 24–36)

## 2017-03-25 LAB — CBG MONITORING, ED: GLUCOSE-CAPILLARY: 95 mg/dL (ref 65–99)

## 2017-03-25 MED ORDER — IBUPROFEN 400 MG PO TABS: 400.0000 mg | ORAL_TABLET | Freq: Once | ORAL | Status: DC

## 2017-03-25 MED ORDER — ACETAMINOPHEN 325 MG PO TABS
650.0000 mg | ORAL_TABLET | Freq: Once | ORAL | Status: AC
  Administered 2017-03-25: 650 mg via ORAL
  Filled 2017-03-25: qty 2

## 2017-03-25 NOTE — ED Notes (Signed)
Patient transported to MRI 

## 2017-03-25 NOTE — Discharge Instructions (Addendum)
Your brain imaging did not show any new findings. It did show that you did have a stroke in the past. Your EKG was unchanged and your heart markers were normal. Please follow up with your PCP. Please return to the ED if you have chest pain, focal weakness, numbness, slurred speech, facial droop, etc.

## 2017-03-25 NOTE — ED Notes (Signed)
ED Provider at bedside. 

## 2017-03-25 NOTE — ED Triage Notes (Signed)
Pt states yesterday he had dizziness and "felt hot on his left side." He states he had some numbness in his left arm which has resolved now. Pt states he still feels some dizziness and a headache. Pt also reports some heaviness in the left side of his chest.

## 2017-03-25 NOTE — ED Provider Notes (Signed)
[name] EMERGENCY DEPARTMENT Provider Note   CSN: [id number] Arrival date & time: 03/25/17  1225     History   Chief Complaint Chief Complaint  Patient presents with  . Dizziness    HPI Caleb Garcia is a 51 y.o. male.  HPI 51 yo male with PMH of nonischemic cardiomyopathy, CHG, DM2, HTN, HLD, Obesity, OSA who presents with left sided chest heaviness, episode of dizziness, left sided headache, and left hand numbness. Yesterday he was working and "suddenly felt funny"; with further questioning he felt, dizzy, sluggish, and off balance. He also had left flank pain at that time as well as some nausea. This self resolved. He drank some water and drove home. Last night he started have left sided chest heaviness and then also some left finger numbness. He is not sure if the numbness is due to how he was sleeping. Chest symptoms not aggravated by activity. No associated nausea, diaphoresis, or shortness of breath. Denies any long drives, lower extremity swelling or calf pain. Reports two days ago he may have pulled a muscle in his left arm at work. He has also been having bilateral but (Left greater than right) elbow pain due to carrying heavy loads for work. Patient denies history of stroke or MI, however his CT head does show chronic infarts. Family history of stroke (mother and father in their 56s). No smoking.   Past Medical History:  Diagnosis Date  . Cardiomyopathy    Non-ischemic  . CHF (congestive heart failure) (HCC)   . Diabetes mellitus without complication (HCC)   . HTN (hypertension)   . Hyperlipidemia   . Hypokalemia   . Obesity   . OSA (obstructive sleep apnea)   . Sleep apnea    had surgery- no cpap  . Systolic CHF [facility])     Patient Active Problem List   Diagnosis Date Noted  . Chest discomfort 07/28/2016  . Controlled type 2 diabetes mellitus without complication (HCC) 11/26/2013  . Bilateral shoulder pain 10/31/2013  . Breast tenderness  in male 10/31/2013  . Erectile dysfunction 04/18/2013  . Mixed hyperlipidemia 11/26/2009  . LIMB PAIN 07/23/2009  . GERD 04/29/2009  . HYPOKALEMIA 10/22/2008  . Obesity 10/22/2008  . OBSTRUCTIVE SLEEP APNEA 10/22/2008  . Essential hypertension 10/22/2008  . CARDIOMYOPATHY 10/22/2008  . CONGESTIVE HEART FAILURE, MILD 10/22/2008  . Chronic systolic heart failure (HCC) 10/16/2008    Past Surgical History:  Procedure Laterality Date  . avulsion fracture of left hip    . CARDIAC CATHETERIZATION  10/08/08  . LEFT HEART CATH AND CORONARY ANGIOGRAPHY N/A 08/03/2016   Procedure: Left Heart Cath and Coronary Angiography;  Surgeon: Dolores Patty, MD;  Location: [facility] INVASIVE CV LAB;  Service: Cardiovascular;  Laterality: N/A;  . TONSILLECTOMY    . UVULOPALATOPHARYNGOPLASTY         Home Medications    Prior to Admission medications   Medication Sig Start Date End Date Taking? Authorizing Provider  acetaminophen (TYLENOL) 500 MG tablet Take 500 mg by mouth every 6 (six) hours as needed for mild pain.   Yes [provider]  aspirin 81 MG tablet Take 81 mg by mouth daily.     Yes [provider]  carvedilol (COREG) 25 MG tablet Take 1 tablet (25 mg total) by mouth 2 (two) times daily with a meal. 04/28/16  Yes Nafziger, Kandee Keen, NP  felodipine (PLENDIL) 2.5 MG 24 hr tablet TAKE ONE TABLET BY MOUTH ONCE DAILY Patient taking differently:  TAKE ONE TABLET(2.5mg ) BY MOUTH ONCE DAILY 11/16/16  Yes Nafziger, Kandee Keen, NP  furosemide (LASIX) 40 MG tablet TAKE ONE TABLET BY MOUTH ONCE DAILY Patient taking differently: TAKE ONE TABLET (40mg )BY MOUTH ONCE DAILY 09/23/16  Yes Nafziger, Kandee Keen, NP  hydrALAZINE (APRESOLINE) 25 MG tablet Take 1 tablet (25 mg total) by mouth 3 (three) times daily. 04/28/16  Yes Nafziger, Kandee Keen, NP  lisinopril (PRINIVIL,ZESTRIL) 40 MG tablet TAKE ONE TABLET BY MOUTH ONCE DAILY Patient taking differently: TAKE ONE TABLET(40mg ) BY MOUTH ONCE DAILY 02/22/17  Yes Nafziger,  Kandee Keen, NP  metFORMIN (GLUCOPHAGE) 500 MG tablet Take 1 tablet (500 mg total) by mouth 2 (two) times daily with a meal. 04/28/16  Yes Nafziger, Kandee Keen, NP  potassium chloride (K-DUR) 10 MEQ tablet TAKE 1 TABLET BY MOUTH ONCE DAILY Patient taking differently: TAKE 1 TABLET (34mEq)BY MOUTH ONCE DAILY 02/22/17  Yes Nafziger, Kandee Keen, NP  spironolactone (ALDACTONE) 25 MG tablet TAKE TWO TABLETS BY MOUTH ONCE DAILY Patient taking differently: TAKE TWO TABLETS(50mg ) BY MOUTH ONCE DAILY 11/16/16  Yes Nafziger, Kandee Keen, NP  tadalafil (CIALIS) 20 MG tablet Take 1 tablet (20 mg total) by mouth daily as needed for erectile dysfunction. 12/04/14  Yes Nafziger, Kandee Keen, NP  atorvastatin (LIPITOR) 20 MG tablet Take 1 tablet (20 mg total) by mouth daily. Patient not taking: Reported on 03/25/2017 04/28/16   Shirline Frees, NP  Butenafine HCl 1 % cream Apply 1 application topically 2 (two) times daily. Patient not taking: Reported on 03/25/2017 07/29/16   Trena Platt D, PA  carvedilol (COREG) 25 MG tablet TAKE ONE TABLET BY MOUTH TWICE DAILY WITH MEALS Patient not taking: Reported on 03/25/2017 12/29/16   Bensimhon, Bevelyn Buckles, MD  diclofenac sodium (VOLTAREN) 1 % GEL Apply 4 g topically 4 (four) times daily. Patient not taking: Reported on 03/25/2017 07/29/16   Garnetta Buddy, PA    Family History Family History  Problem Relation Age of Onset  . Diabetes Mother   . Hypertension Mother   . Heart disease Mother   . Cancer Father        Throat cancer  . Stroke Father   . Prostate cancer Neg Hx   . Colon cancer Neg Hx   . Colon polyps Neg Hx   . Esophageal cancer Neg Hx   . Rectal cancer Neg Hx   . Stomach cancer Neg Hx     Social History Social History   Tobacco Use  . Smoking status: Never Smoker  . Smokeless tobacco: Never Used  Substance Use Topics  . Alcohol use: No  . Drug use: No     Allergies   Shrimp [shellfish allergy]   Review of Systems Review of Systems  Constitutional: Negative  for appetite change, chills and fever.  HENT: Negative for rhinorrhea and sore throat.   Respiratory: Negative for cough and shortness of breath.   Cardiovascular: Positive for chest pain. Negative for palpitations and leg swelling.  Gastrointestinal: Positive for nausea. Negative for abdominal pain, constipation and diarrhea.     Physical Exam Updated Vital Signs BP (!) 139/93   Pulse 73   Temp 98.1 F (36.7 C)   Resp 16   SpO2 97%   Physical Exam  Constitutional: He is oriented to person, place, and time. He appears well-developed and well-nourished. No distress.  HENT:  Head: Normocephalic and atraumatic.  Mouth/Throat: Oropharynx is clear and moist. No oropharyngeal exudate.  Eyes: Conjunctivae and EOM are normal. Pupils are equal, round, and reactive to light.  Neck: Normal range of motion.  Cardiovascular: Normal rate, regular rhythm and normal heart sounds.  Pulmonary/Chest: Effort normal and breath sounds normal.  Abdominal: Soft. Bowel sounds are normal. There is no tenderness.  Musculoskeletal:  Tenderness to palpation of the left lateral epicondyle (more than right lateral epicondyle).   Neurological: He is alert and oriented to person, place, and time. No cranial nerve deficit or sensory deficit. Coordination normal.  Normal strength in upper and lower extremities bilaterally.   Skin: Skin is warm and dry. Capillary refill takes less than 2 seconds.  Psychiatric: He has a normal mood and affect. His behavior is normal. Judgment and thought content normal.    ED Treatments / Results  Labs (all labs ordered are listed, but only abnormal results are displayed) Labs Reviewed  CBC - Abnormal; Notable for the following components:      Result Value   RBC 4.20 (*)    Hemoglobin 12.8 (*)    HCT 38.4 (*)    All other components within normal limits  COMPREHENSIVE METABOLIC PANEL - Abnormal; Notable for the following components:   Sodium 133 (*)    Glucose, Bld 118  (*)    Alkaline Phosphatase 33 (*)    All other components within normal limits  I-STAT CHEM 8, ED - Abnormal; Notable for the following components:   Glucose, Bld 114 (*)    All other components within normal limits  PROTIME-INR  APTT  DIFFERENTIAL  TROPONIN I  I-STAT TROPONIN, ED  CBG MONITORING, ED    EKG  EKG Interpretation  Date/Time:  Friday March 25 2017 13:00:45 EST Ventricular Rate:  75 PR Interval:  198 QRS Duration: 84 QT Interval:  374 QTC Calculation: 417 R Axis:   3 Text Interpretation:  Normal sinus rhythm T wave abnormality, consider inferior ischemia Abnormal ECG no change from multiple previous ecg's Confirmed by Marily MemosMesner, Jason [phone]([id number]) on 03/25/2017 5:49:01 PM       Radiology Ct Head Wo Contrast  Result Date: 03/25/2017 CLINICAL DATA:  Dizziness and left arm numbness, which could yesterday but has since resolved. EXAM: CT HEAD WITHOUT CONTRAST TECHNIQUE: Contiguous axial images were obtained from the base of the skull through the vertex without intravenous contrast. COMPARISON:  Head CT, 10/05/2008. FINDINGS: Brain: No evidence of acute infarction, hemorrhage, hydrocephalus, extra-axial collection or mass lesion/mass effect. There is a small area of encephalomalacia involving the left parietal lobe, which reflects evolution of infarcts noted on the prior head CT. There is a small area of focal hypoattenuation adjacent to the frontal horn of the right lateral ventricle also consistent with an old infarct. Small foci of hypoattenuation are noted in the basal ganglia consistent with old lacune infarcts. There are small areas of patchy white matter hypoattenuation consistent with mild chronic microvascular ischemic change. Vascular: No hyperdense vessel or unexpected calcification. Skull: Normal. Negative for fracture or focal lesion. Sinuses/Orbits: The globes and orbits are unremarkable. Mild mucosal thickening lines the maxillary and ethmoid air cells. Mastoid air  cells are clear. Other: None. IMPRESSION: 1. No acute intracranial abnormalities. 2. Old infarcts and mild chronic microvascular ischemic change as detailed above. Electronically Signed   By: Amie Portlandavid  Ormond M.D.   On: 03/25/2017 16:50   Mr Brain Wo Contrast  Result Date: 03/25/2017 CLINICAL DATA:  Initial evaluation for headaches, TIA. EXAM: MRI HEAD WITHOUT CONTRAST TECHNIQUE: Multiplanar, multiecho pulse sequences of the brain and surrounding structures were obtained without intravenous contrast. COMPARISON:  Prior CT from earlier the same  day. FINDINGS: Brain: Generalized age-related cerebral atrophy. Patchy and confluent T2/FLAIR hyperintensity within the periventricular and deep white matter both cerebral hemispheres, most consistent with chronic small vessel ischemic disease, mild to moderate for age. Superimposed remote lacunar infarcts present within the bilateral basal ganglia. Small remote lacunar infarct also noted within the left thalamus. Encephalomalacia with gliosis within the left occipital lobe consistent with remote ischemic infarct. No abnormal foci of restricted diffusion to suggest acute or subacute ischemia. Gray-white matter differentiation otherwise maintained. No other areas of chronic infarction. No foci of susceptibility artifact to suggest acute or chronic intracranial hemorrhage. No mass lesion, midline shift or mass effect. Ventricles normal in size without hydrocephalus. No extra-axial fluid collection. The major dural sinuses are grossly patent. Pituitary gland suprasellar region normal. Midline structures intact and normal. Small microcystic pineal gland noted. Vascular: Major intracranial vascular flow voids are maintained at the skullbase. Skull and upper cervical spine: Craniocervical junction normal. Upper cervical spine normal. Bone marrow signal intensity normal. No scalp soft tissue abnormality. Sinuses/Orbits: Globes and orbital soft tissues within normal limits.  Scattered mucosal thickening within the ethmoidal air cells and maxillary sinuses. Superimposed bilateral maxillary sinus retention cyst noted. No mastoid effusion. Inner ear structures normal. Other: None. IMPRESSION: 1. No acute intracranial abnormality. 2. Moderate-size remote left occipital cortical infarct. 3. Mild to moderate chronic small vessel ischemic disease scattered remote lacunar infarcts involving the bilateral basal ganglia and left thalamus. Electronically Signed   By: Rise Mu M.D.   On: 03/25/2017 20:05    Procedures Procedures (including critical care time)  Medications Ordered in ED Medications  acetaminophen (TYLENOL) tablet 650 mg (650 mg Oral Given 03/25/17 2013)     Initial Impression / Assessment and Plan / ED Course  I have reviewed the triage vital signs and the nursing notes.  Pertinent labs & imaging results that were available during my care of the patient were reviewed by me and considered in my medical decision making (see chart for details).  51 year old male with PMH of nonischemic cardiomyopathy, CHG, DM2, HTN, HLD, Obesity, OSA who presents with left sided chest heaviness, episode of dizziness (resolved prior to coming to ED), left sided headache (improved), and left hand numbness (resolved prior to coming to ED. His CT head showed no acute abnormalities but did show old infarcts that patient was unaware of and mild chronic microvascular ischemic changes. Therefore we obtained MRI brain which showed no acute findings and moderate size remote L occipital cortical infarct and mild to moderate chronic small vessel ischemic disease. EKG is unchanged from prior and delta troponin is negative. Tylenol relieved chest heaviness and headache improved. He has symptoms of left lateral epicondylitis which may explain his episode of hand numbness. Patient stable for discharge. Return precautions discussed.   Final Clinical Impressions(s) / ED Diagnoses   Final  diagnoses:  Dizziness  Chest heaviness    ED Discharge Orders    None       Palma Holter, MD 03/25/17 2036    Marily Memos, MD 03/25/17 2342

## 2017-03-25 NOTE — ED Notes (Signed)
Pt returned from MRI °

## 2017-03-25 NOTE — ED Provider Notes (Signed)
I saw and evaluated the patient, reviewed the resident's note and I agree with the findings and plan with the following exceptions.   51 year old male with a history of strokes that he doesn't know about.  Also has a history of CHF but has a recent clean catheterization of his heart.  Presents today with an acute onset of left-sided weakness and warmth yesterday followed by a headache.  Patient states he is never had this before.  Today he still has a little bit of left chest pain and headache also feels a little bit lightheaded but no other associated symptoms.   On exam his neurologic exam is normal. Heart rrr no m/r/g. Lungs ctab.  His EKG is normal troponin is normal.  CT scan does show multiple old infarcts.   Secondary to this I think the patient likely needs an MRI to make sure he have an acute stroke yesterday if this is negative at that he be discharged home with neurology follow-up.   EKG Interpretation  Date/Time:  Friday March 25 2017 13:00:45 EST Ventricular Rate:  75 PR Interval:  198 QRS Duration: 84 QT Interval:  374 QTC Calculation: 417 R Axis:   3 Text Interpretation:  Normal sinus rhythm T wave abnormality, consider inferior ischemia Abnormal ECG no change from multiple previous ecg's Confirmed by Marily Memos [phone]) on 03/25/2017 5:49:01 PM         Yunique Dearcos, Barbara Cower, MD 03/25/17 2343

## 2017-05-10 ENCOUNTER — Other Ambulatory Visit: Payer: Self-pay | Admitting: Adult Health

## 2017-05-10 DIAGNOSIS — Z760 Encounter for issue of repeat prescription: Secondary | ICD-10-CM

## 2017-05-10 DIAGNOSIS — E119 Type 2 diabetes mellitus without complications: Secondary | ICD-10-CM

## 2017-05-10 NOTE — Telephone Encounter (Signed)
Sent to the pharmacy by e-scribe for 30 days.  Spoke to the pt and scheduled him for cpx on 05/25/17 @ 7 AM.  Pt notified to come fasitng and arrive at 6:45 AM.

## 2017-05-25 ENCOUNTER — Encounter: Payer: 59 | Admitting: Adult Health

## 2017-05-25 DIAGNOSIS — Z0289 Encounter for other administrative examinations: Secondary | ICD-10-CM

## 2017-05-25 NOTE — Progress Notes (Deleted)
Subjective:    Patient ID: Caleb Garcia, male    DOB: 1965/09/06, 52 y.o.   MRN: [id number]  HPI  Patient presents for yearly preventative medicine examination. He is a pleasant 52 year old male who  has a past medical history of Cardiomyopathy, CHF (congestive heart failure) (HCC), Diabetes mellitus without complication (HCC), HTN (hypertension), Hyperlipidemia, Hypokalemia, Obesity, OSA (obstructive sleep apnea), Sleep apnea, and Systolic CHF (HCC).  He takes Lipitor and 81 mg of aspirin daily for history of hyperlipidemia Lab Results  Component Value Date   CHOL 172 05/04/2016   HDL 40.90 05/04/2016   LDLCALC 98 05/04/2016   LDLDIRECT 85.8 10/31/2013   TRIG 162.0 (H) 05/04/2016   CHOLHDL 4 05/04/2016   He takes metformin 500 mg twice daily for history of diabetes Lab Results  Component Value Date   HGBA1C 6.7 (H) 05/04/2016   Due to history of uncontrolled hypertension he takes lisinopril, Plendil, Coreg, hydralazine, Lasix, and spironolactone  All immunizations and health maintenance protocols were reviewed with the patient and needed orders were placed.  Appropriate screening laboratory values were ordered for the patient including screening of hyperlipidemia, renal function and hepatic function. If indicated by BPH, a PSA was ordered.  Medication reconciliation,  past medical history, social history, problem list and allergies were reviewed in detail with the patient  Goals were established with regard to weight loss, exercise, and  diet in compliance with medications  Interval history includes that of: 1.  Left-sided cardiac cath in May 2018- clean 2.  He presented to the emergency room in December 2018 for the complaint of left-sided weakness and warmth with acute onset of headache.  CT of the head showed no acute abnormalities but did show old infarcts(patient was unaware of this).  The brain was obtained which showed no acute findings a moderate size remote left  occipital cortical infarct and mild to moderate chronic small vessel ischemic disease.   Review of Systems  Constitutional: Negative.   HENT: Negative.   Eyes: Negative.   Respiratory: Negative.   Cardiovascular: Negative.   Gastrointestinal: Negative.   Endocrine: Negative.   Genitourinary: Negative.   Musculoskeletal: Negative.   Skin: Negative.   Allergic/Immunologic: Negative.   Neurological: Negative.   Hematological: Negative.   Psychiatric/Behavioral: Negative.   All other systems reviewed and are negative.      Objective:   Physical Exam  Constitutional: He is oriented to person, place, and time. He appears well-developed and well-nourished. No distress.  HENT:  Head: Normocephalic and atraumatic.  Right Ear: External ear normal.  Left Ear: External ear normal.  Nose: Nose normal.  Mouth/Throat: Oropharynx is clear and moist. No oropharyngeal exudate.  Eyes: Conjunctivae and EOM are normal. Pupils are equal, round, and reactive to light. Right eye exhibits no discharge. Left eye exhibits no discharge. No scleral icterus.  Neck: Normal range of motion. Neck supple. No JVD present. No tracheal deviation present. No thyromegaly present.  Cardiovascular: Normal rate, regular rhythm, normal heart sounds and intact distal pulses. Exam reveals no gallop and no friction rub.  No murmur heard. Pulmonary/Chest: Effort normal and breath sounds normal. No stridor. No respiratory distress. He has no wheezes. He has no rales. He exhibits no tenderness.  Abdominal: Soft. Bowel sounds are normal. He exhibits no distension and no mass. There is no tenderness. There is no rebound and no guarding.  Musculoskeletal: Normal range of motion. He exhibits no edema, tenderness or deformity.  Lymphadenopathy:  He has no cervical adenopathy.  Neurological: He is alert and oriented to person, place, and time. He has normal reflexes. He displays normal reflexes. No cranial nerve deficit. He  exhibits normal muscle tone. Coordination normal.  Skin: Skin is warm and dry. No rash noted. He is not diaphoretic. No erythema. No pallor.  Psychiatric: He has a normal mood and affect. His behavior is normal. Judgment and thought content normal.  Nursing note and vitals reviewed.     Assessment & Plan:

## 2017-06-22 ENCOUNTER — Other Ambulatory Visit: Payer: Self-pay | Admitting: Adult Health

## 2017-06-22 DIAGNOSIS — Z760 Encounter for issue of repeat prescription: Secondary | ICD-10-CM

## 2017-06-22 MED ORDER — POTASSIUM CHLORIDE ER 10 MEQ PO TBCR: EXTENDED_RELEASE_TABLET | ORAL | 0 refills | Status: DC

## 2017-06-22 MED ORDER — LISINOPRIL 40 MG PO TABS: 40.0000 mg | ORAL_TABLET | Freq: Every day | ORAL | 0 refills | Status: DC

## 2017-06-22 NOTE — Telephone Encounter (Signed)
Sent to the pharmacy by e-scribe for 30 days.  Pt has upcoming cpx on 07/06/17.

## 2017-06-22 NOTE — Telephone Encounter (Signed)
Attempted to contact pt regarding refill request; lisinopril to be filled with enough medication to cover him until his upcoming appointment with Shirline Frees, [facility],07/06/17 at 0800; will route request for potassium to office;  Potassium LOV: 05/04/16 PCP: Shirline Frees Pharmacy: [facility] [place], [place]

## 2017-06-22 NOTE — Telephone Encounter (Signed)
Copied from CRM [id number]. Topic: Quick Communication - Rx Refill/Question >> Jun 22, 2017 11:10 AM Avie Arenas L, NT wrote: Medication:  lisinopril (PRINIVIL,ZESTRIL) 40 MG tablet  and also potassium chloride (K-DUR) 10 MEQ tablet Has the patient contacted their pharmacy? {yes (Agent: If no, request that the patient contact the pharmacy for the refill.) Preferred Pharmacy (with phone number or street name):[facility] - [place], [place] - [address] [place] [phone] (Phone) [phone] (Fax Agent: Please be advised that RX refills may take up to 3 business days. We ask that you follow-up with your pharmacy.

## 2017-06-22 NOTE — Telephone Encounter (Signed)
Sent to the pharmacy by e-scribe.  Now scheduled for cpx on 07/06/17

## 2017-07-04 ENCOUNTER — Encounter: Payer: Self-pay | Admitting: Physician Assistant

## 2017-07-06 ENCOUNTER — Encounter: Payer: Self-pay | Admitting: Adult Health

## 2017-07-06 ENCOUNTER — Ambulatory Visit (INDEPENDENT_AMBULATORY_CARE_PROVIDER_SITE_OTHER): Payer: 59 | Admitting: Adult Health

## 2017-07-06 VITALS — BP 120/82 | Temp 98.2°F | Ht 68.5 in | Wt 242.0 lb

## 2017-07-06 DIAGNOSIS — I10 Essential (primary) hypertension: Secondary | ICD-10-CM

## 2017-07-06 DIAGNOSIS — Z1211 Encounter for screening for malignant neoplasm of colon: Secondary | ICD-10-CM

## 2017-07-06 DIAGNOSIS — I5022 Chronic systolic (congestive) heart failure: Secondary | ICD-10-CM

## 2017-07-06 DIAGNOSIS — Z125 Encounter for screening for malignant neoplasm of prostate: Secondary | ICD-10-CM | POA: Diagnosis not present

## 2017-07-06 DIAGNOSIS — Z0000 Encounter for general adult medical examination without abnormal findings: Secondary | ICD-10-CM | POA: Diagnosis not present

## 2017-07-06 DIAGNOSIS — G4733 Obstructive sleep apnea (adult) (pediatric): Secondary | ICD-10-CM

## 2017-07-06 DIAGNOSIS — Z23 Encounter for immunization: Secondary | ICD-10-CM | POA: Diagnosis not present

## 2017-07-06 DIAGNOSIS — Z114 Encounter for screening for human immunodeficiency virus [HIV]: Secondary | ICD-10-CM | POA: Diagnosis not present

## 2017-07-06 DIAGNOSIS — E782 Mixed hyperlipidemia: Secondary | ICD-10-CM

## 2017-07-06 DIAGNOSIS — M25561 Pain in right knee: Secondary | ICD-10-CM | POA: Diagnosis not present

## 2017-07-06 DIAGNOSIS — G8929 Other chronic pain: Secondary | ICD-10-CM

## 2017-07-06 DIAGNOSIS — R234 Changes in skin texture: Secondary | ICD-10-CM

## 2017-07-06 DIAGNOSIS — Z760 Encounter for issue of repeat prescription: Secondary | ICD-10-CM | POA: Diagnosis not present

## 2017-07-06 DIAGNOSIS — E119 Type 2 diabetes mellitus without complications: Secondary | ICD-10-CM

## 2017-07-06 LAB — HEPATIC FUNCTION PANEL
ALT: 15 U/L (ref 0–53)
AST: 16 U/L (ref 0–37)
Albumin: 4.3 g/dL (ref 3.5–5.2)
Alkaline Phosphatase: 35 U/L — ABNORMAL LOW (ref 39–117)
BILIRUBIN DIRECT: 0.1 mg/dL (ref 0.0–0.3)
BILIRUBIN TOTAL: 0.4 mg/dL (ref 0.2–1.2)
Total Protein: 7.6 g/dL (ref 6.0–8.3)

## 2017-07-06 LAB — CBC WITH DIFFERENTIAL/PLATELET
BASOS ABS: 0 10*3/uL (ref 0.0–0.1)
Basophils Relative: 0.5 % (ref 0.0–3.0)
EOS ABS: 0.1 10*3/uL (ref 0.0–0.7)
Eosinophils Relative: 1.4 % (ref 0.0–5.0)
HEMATOCRIT: 39 % (ref 39.0–52.0)
Hemoglobin: 13.2 g/dL (ref 13.0–17.0)
LYMPHS ABS: 1.6 10*3/uL (ref 0.7–4.0)
LYMPHS PCT: 35.2 % (ref 12.0–46.0)
MCHC: 33.9 g/dL (ref 30.0–36.0)
MCV: 91.2 fl (ref 78.0–100.0)
MONO ABS: 0.4 10*3/uL (ref 0.1–1.0)
Monocytes Relative: 7.7 % (ref 3.0–12.0)
NEUTROS ABS: 2.6 10*3/uL (ref 1.4–7.7)
NEUTROS PCT: 55.2 % (ref 43.0–77.0)
PLATELETS: 226 10*3/uL (ref 150.0–400.0)
RBC: 4.27 Mil/uL (ref 4.22–5.81)
RDW: 13.8 % (ref 11.5–15.5)
WBC: 4.7 10*3/uL (ref 4.0–10.5)

## 2017-07-06 LAB — LDL CHOLESTEROL, DIRECT: Direct LDL: 91 mg/dL

## 2017-07-06 LAB — HEMOGLOBIN A1C: Hgb A1c MFr Bld: 7.1 % — ABNORMAL HIGH (ref 4.6–6.5)

## 2017-07-06 LAB — BASIC METABOLIC PANEL
BUN: 21 mg/dL (ref 6–23)
CALCIUM: 9.3 mg/dL (ref 8.4–10.5)
CO2: 26 meq/L (ref 19–32)
Chloride: 102 mEq/L (ref 96–112)
Creatinine, Ser: 0.94 mg/dL (ref 0.40–1.50)
GFR: 108.51 mL/min (ref >60.00)
Glucose, Bld: 143 mg/dL — ABNORMAL HIGH (ref 70–99)
Potassium: 4 mEq/L (ref 3.5–5.1)
Sodium: 137 mEq/L (ref 135–145)

## 2017-07-06 LAB — TSH: TSH: 1.13 u[IU]/mL (ref 0.35–4.50)

## 2017-07-06 LAB — LIPID PANEL
CHOL/HDL RATIO: 4
CHOLESTEROL: 190 mg/dL (ref 0–200)
HDL: 44.3 mg/dL (ref >39.00)
NONHDL: 145.39
TRIGLYCERIDES: 216 mg/dL — AB (ref 0.0–149.0)
VLDL: 43.2 mg/dL — AB (ref 0.0–40.0)

## 2017-07-06 LAB — PSA: PSA: 0.57 ng/mL (ref 0.10–4.00)

## 2017-07-06 MED ORDER — BUTENAFINE HCL 1 % EX CREA: 1.0000 "application " | TOPICAL_CREAM | Freq: Two times a day (BID) | CUTANEOUS | 1 refills | Status: DC

## 2017-07-06 MED ORDER — LISINOPRIL 40 MG PO TABS: 40.0000 mg | ORAL_TABLET | Freq: Every day | ORAL | 3 refills | Status: DC

## 2017-07-06 MED ORDER — DICLOFENAC SODIUM 1 % TD GEL: 4.0000 g | Freq: Four times a day (QID) | TRANSDERMAL | 5 refills | Status: DC

## 2017-07-06 MED ORDER — FELODIPINE ER 2.5 MG PO TB24: ORAL_TABLET | ORAL | 3 refills | Status: DC

## 2017-07-06 MED ORDER — TADALAFIL 20 MG PO TABS: 20.0000 mg | ORAL_TABLET | Freq: Every day | ORAL | 3 refills | Status: DC | PRN

## 2017-07-06 NOTE — Addendum Note (Signed)
Addended by: Raj Janus T on: 07/06/2017 10:17 AM   Modules accepted: Orders

## 2017-07-06 NOTE — Progress Notes (Signed)
Subjective:    Patient ID: Caleb Garcia, male    DOB: Jan 17, 1966, 52 y.o.   MRN: [id number]  HPI Patient presents for yearly preventative medicine examination. He is a pleasant 52 year old male who  has a past medical history of Cardiomyopathy, CHF (congestive heart failure) (HCC), Diabetes mellitus without complication (HCC), HTN (hypertension), Hyperlipidemia, Hypokalemia, Obesity, OSA (obstructive sleep apnea), Sleep apnea, and Systolic CHF (HCC).   He has been prescribed Lipitor 20 mg and ASA 81 mg d/t history of hyperlipidemia He reports that he has not been taking this medication as he ran out of it.  Lab Results  Component Value Date   CHOL 172 05/04/2016   HDL 40.90 05/04/2016   LDLCALC 98 05/04/2016   LDLDIRECT 85.8 10/31/2013   TRIG 162.0 (H) 05/04/2016   CHOLHDL 4 05/04/2016   He takes Lisinopril 40 mg, Coreg 25 mg, Hydralazine 25 mg TID, lasix 40 mg and spirolactone 50 mg due to history of hypertension, CHF, and cardiomyopathy BP Readings from Last 3 Encounters:  07/06/17 120/82  03/25/17 (!) 139/93  08/03/16 125/72   He takes Metformin 500 mg BID for DM II Lab Results  Component Value Date   HGBA1C 6.7 (H) 05/04/2016   He continues to complain of bilateral knee pain. He has been seen in the past by orthopedics and had injections done, which did not work for him.   All immunizations and health maintenance protocols were reviewed with the patient and needed orders were placed. He is due for Prevnar 13   Appropriate screening laboratory values were ordered for the patient including screening of hyperlipidemia, renal function and hepatic function. If indicated by BPH, a PSA was ordered.  Medication reconciliation,  past medical history, social history, problem list and allergies were reviewed in detail with the patient  Goals were established with regard to weight loss, exercise, and  diet in compliance with medications. He has a physically demanding job. He is  working on diet, he goes in spurts with healthy eating. Is trying to work on portion control. He has cut out beer.  Wt Readings from Last 3 Encounters:  07/06/17 242 lb (109.8 kg)  08/03/16 239 lb (108.4 kg)  07/29/16 239 lb 3.2 oz (108.5 kg)   He is due for his colonoscopy and diabetic eye exam.    Review of Systems  Constitutional: Negative.   HENT: Negative.   Eyes: Negative.   Respiratory: Negative.   Cardiovascular: Negative.   Gastrointestinal: Negative.   Endocrine: Negative.   Genitourinary: Negative.   Musculoskeletal: Positive for arthralgias.  Skin: Negative.   Allergic/Immunologic: Negative.   Neurological: Negative.   Hematological: Negative.   Psychiatric/Behavioral: Negative.   All other systems reviewed and are negative.    Past Medical History:  Diagnosis Date  . Cardiomyopathy    Non-ischemic  . CHF (congestive heart failure) (HCC)   . Diabetes mellitus without complication (HCC)   . HTN (hypertension)   . Hyperlipidemia   . Hypokalemia   . Obesity   . OSA (obstructive sleep apnea)   . Sleep apnea    had surgery- no cpap  . Systolic CHF [facility])     Social History   Socioeconomic History  . Marital status: Married    Spouse name: Not on file  . Number of children: Not on file  . Years of education: Not on file  . Highest education level: Not on file  Occupational History    Employer: UNEMPLOYED  Social  Needs  . Financial resource strain: Not on file  . Food insecurity:    Worry: Not on file    Inability: Not on file  . Transportation needs:    Medical: Not on file    Non-medical: Not on file  Tobacco Use  . Smoking status: Never Smoker  . Smokeless tobacco: Never Used  Substance and Sexual Activity  . Alcohol use: No  . Drug use: No  . Sexual activity: Not on file  Lifestyle  . Physical activity:    Days per week: Not on file    Minutes per session: Not on file  . Stress: Not on file  Relationships  . Social connections:     Talks on phone: Not on file    Gets together: Not on file    Attends religious service: Not on file    Active member of club or organization: Not on file    Attends meetings of clubs or organizations: Not on file    Relationship status: Not on file  . Intimate partner violence:    Fear of current or ex partner: Not on file    Emotionally abused: Not on file    Physically abused: Not on file    Forced sexual activity: Not on file  Other Topics Concern  . Not on file  Social History Narrative   Married 6 years   2 biologic daughters   2 stepsons, one Programme researcher, broadcasting/film/video for [employer]     Past Surgical History:  Procedure Laterality Date  . avulsion fracture of left hip    . CARDIAC CATHETERIZATION  10/08/08  . LEFT HEART CATH AND CORONARY ANGIOGRAPHY N/A 08/03/2016   Procedure: Left Heart Cath and Coronary Angiography;  Surgeon: Dolores Patty, MD;  Location: [facility] INVASIVE CV LAB;  Service: Cardiovascular;  Laterality: N/A;  . TONSILLECTOMY    . UVULOPALATOPHARYNGOPLASTY      Family History  Problem Relation Age of Onset  . Diabetes Mother   . Hypertension Mother   . Heart disease Mother   . Cancer Father        Throat cancer  . Stroke Father   . Prostate cancer Neg Hx   . Colon cancer Neg Hx   . Colon polyps Neg Hx   . Esophageal cancer Neg Hx   . Rectal cancer Neg Hx   . Stomach cancer Neg Hx     Allergies  Allergen Reactions  . Shrimp [Shellfish Allergy] Swelling    Current Outpatient Medications on File Prior to Visit  Medication Sig Dispense Refill  . acetaminophen (TYLENOL) 500 MG tablet Take 500 mg by mouth every 6 (six) hours as needed for mild pain.    Marland Kitchen aspirin 81 MG tablet Take 81 mg by mouth daily.      . carvedilol (COREG) 25 MG tablet Take 1 tablet (25 mg total) by mouth 2 (two) times daily with a meal. 180 tablet 0  . carvedilol (COREG) 25 MG tablet TAKE ONE TABLET BY MOUTH TWICE DAILY WITH MEALS 60 tablet 3  . felodipine (PLENDIL) 2.5 MG 24 hr  tablet TAKE ONE TABLET BY MOUTH ONCE DAILY (Patient taking differently: TAKE ONE TABLET(2.5mg ) BY MOUTH ONCE DAILY) 90 tablet 1  . furosemide (LASIX) 40 MG tablet TAKE ONE TABLET BY MOUTH ONCE DAILY (Patient taking differently: TAKE ONE TABLET (40mg )BY MOUTH ONCE DAILY) 90 tablet 1  . hydrALAZINE (APRESOLINE) 25 MG tablet TAKE ONE TABLET BY MOUTH THREE TIMES DAILY 90 tablet  0  . lisinopril (PRINIVIL,ZESTRIL) 40 MG tablet Take 1 tablet (40 mg total) by mouth daily. 30 tablet 0  . metFORMIN (GLUCOPHAGE) 500 MG tablet TAKE ONE TABLET BY MOUTH TWICE DAILY WITH MEALS 60 tablet 0  . potassium chloride (K-DUR) 10 MEQ tablet TAKE 1 TABLET (36mEq)BY MOUTH ONCE DAILY 30 tablet 0  . spironolactone (ALDACTONE) 25 MG tablet TAKE TWO TABLETS BY MOUTH ONCE DAILY (Patient taking differently: TAKE TWO TABLETS(50mg ) BY MOUTH ONCE DAILY) 180 tablet 1  . atorvastatin (LIPITOR) 20 MG tablet Take 1 tablet (20 mg total) by mouth daily. (Patient not taking: Reported on 03/25/2017) 90 tablet 0  . Butenafine HCl 1 % cream Apply 1 application topically 2 (two) times daily. (Patient not taking: Reported on 03/25/2017) 24 g 0  . diclofenac sodium (VOLTAREN) 1 % GEL Apply 4 g topically 4 (four) times daily. (Patient not taking: Reported on 03/25/2017) 100 g 0  . tadalafil (CIALIS) 20 MG tablet Take 1 tablet (20 mg total) by mouth daily as needed for erectile dysfunction. (Patient not taking: Reported on 07/06/2017) 10 tablet 3   No current facility-administered medications on file prior to visit.     BP 120/82 (BP Location: Left Arm)   Temp 98.2 F (36.8 C) (Oral)   Ht 5' 8.5" (1.74 m)   Wt 242 lb (109.8 kg)   BMI 36.26 kg/m       Objective:   Physical Exam  Constitutional: He is oriented to person, place, and time. He appears well-developed and well-nourished. No distress.  Obese   HENT:  Head: Normocephalic and atraumatic.  Right Ear: External ear normal.  Left Ear: External ear normal.  Nose: Nose normal.    Mouth/Throat: Oropharynx is clear and moist. No oropharyngeal exudate.  Eyes: Pupils are equal, round, and reactive to light. Conjunctivae and EOM are normal. Right eye exhibits no discharge. Left eye exhibits no discharge. No scleral icterus.  Neck: Normal range of motion. Neck supple. No JVD present. No tracheal deviation present. No thyromegaly present.  Cardiovascular: Normal rate, regular rhythm, normal heart sounds and intact distal pulses. Exam reveals no gallop and no friction rub.  No murmur heard. Pulmonary/Chest: Effort normal and breath sounds normal. No stridor. No respiratory distress. He has no wheezes. He has no rales. He exhibits no tenderness.  Abdominal: Soft. Bowel sounds are normal. He exhibits no distension and no mass. There is no tenderness. There is no rebound and no guarding.  Genitourinary:  Genitourinary Comments: Will do PSA  Musculoskeletal: Normal range of motion. He exhibits tenderness (bilateral knees). He exhibits no edema or deformity.  Lymphadenopathy:    He has no cervical adenopathy.  Neurological: He is alert and oriented to person, place, and time. He has normal reflexes. He displays normal reflexes. No cranial nerve deficit. He exhibits normal muscle tone. Coordination normal.  Skin: Skin is warm and dry. No rash noted. He is not diaphoretic. No erythema. No pallor.  Psychiatric: He has a normal mood and affect. His behavior is normal. Judgment and thought content normal.  Nursing note and vitals reviewed.     Assessment & Plan:  1. Routine general medical examination at a health care facility - Encouraged weight loss through diet and exercise  - Follow up in one year or sooner if needed - Basic metabolic panel - CBC with Differential/Platelet - Hemoglobin A1c - Hepatic function panel - Lipid panel - TSH  2. Encounter for screening for HIV  - HIV antibody  3.  Essential hypertension - Well controlled. No change in medications  - Follow up  in 6 months  - Basic metabolic panel - CBC with Differential/Platelet - Hemoglobin A1c - Hepatic function panel - Lipid panel - TSH  4. Mixed hyperlipidemia - Will likely add back Lipitor 20 mg  - Basic metabolic panel - CBC with Differential/Platelet - Hemoglobin A1c - Hepatic function panel - Lipid panel - TSH  5. OBSTRUCTIVE SLEEP APNEA - Continue with POC   6. Controlled type 2 diabetes mellitus without complication, without long-term current use of insulin (HCC) - Consider adding medication  - Basic metabolic panel - CBC with Differential/Platelet - Hemoglobin A1c - Hepatic function panel - Lipid panel - TSH - Ambulatory referral to Podiatry  7. Chronic systolic heart failure (HCC) - Continue with current medications - Basic metabolic panel - CBC with Differential/Platelet - Hemoglobin A1c - Hepatic function panel - Lipid panel - TSH  8. Prostate cancer screening  - PSA  9. Colon cancer screening  - Ambulatory referral to Gastroenterology  10. Need for Streptococcus pneumoniae vaccination  - Pneumococcal polysaccharide vaccine 23-valent greater than or equal to 2yo subcutaneous/IM   Shirline Frees, NP

## 2017-07-07 LAB — HIV ANTIBODY (ROUTINE TESTING W REFLEX): HIV 1&2 Ab, 4th Generation: NONREACTIVE

## 2017-07-12 ENCOUNTER — Encounter: Payer: Self-pay | Admitting: Family Medicine

## 2017-07-27 ENCOUNTER — Other Ambulatory Visit: Payer: Self-pay | Admitting: Adult Health

## 2017-07-27 DIAGNOSIS — Z760 Encounter for issue of repeat prescription: Secondary | ICD-10-CM

## 2017-07-27 NOTE — Telephone Encounter (Signed)
Sent to the pharmacy by e-scribe. 

## 2017-08-13 ENCOUNTER — Other Ambulatory Visit: Payer: Self-pay | Admitting: Adult Health

## 2017-08-13 DIAGNOSIS — E119 Type 2 diabetes mellitus without complications: Secondary | ICD-10-CM

## 2017-08-13 DIAGNOSIS — Z760 Encounter for issue of repeat prescription: Secondary | ICD-10-CM

## 2017-08-16 NOTE — Telephone Encounter (Signed)
Sent to the pharmacy by e-scribe. 

## 2017-08-16 NOTE — Telephone Encounter (Signed)
Metformin refill Last OV:07/06/17 Last refill:05/30/17 (Patient's dosage instructions changed on 07/06/17 per Cory's notes to 1000 mg BID. New prescription is needed to reflect this change. KMM:NOTRRNHA Pharmacy: [facility] [address] [place] - [address] [place] [phone] (Phone) [phone] (Fax)

## 2017-08-16 NOTE — Telephone Encounter (Signed)
Copied from CRM [id number]. Topic: Quick Communication - Rx Refill/Question >> Aug 16, 2017  9:30 AM Eston Mould B wrote: Medication: metFORMIN (GLUCOPHAGE) 500 MG tablet  Has the patient contacted their pharmacy? No  (Agent: If no, request that the patient contact the pharmacy for the refill.)  Preferred Pharmacy (with phone number or street name): [facility] - [place], [place] - [address] [place] [phone] (Phone) [phone] (Fax)      Agent: Please be advised that RX refills may take up to 3 business days. We ask that you follow-up with your pharmacy.

## 2017-09-05 ENCOUNTER — Encounter: Payer: Self-pay | Admitting: Adult Health

## 2017-09-11 ENCOUNTER — Other Ambulatory Visit: Payer: Self-pay | Admitting: Adult Health

## 2017-09-11 DIAGNOSIS — I5022 Chronic systolic (congestive) heart failure: Secondary | ICD-10-CM

## 2017-09-11 DIAGNOSIS — Z760 Encounter for issue of repeat prescription: Secondary | ICD-10-CM

## 2017-09-13 NOTE — Telephone Encounter (Signed)
Sent to the pharmacy by e-scribe. 

## 2017-11-03 ENCOUNTER — Other Ambulatory Visit (HOSPITAL_COMMUNITY)
Admission: RE | Admit: 2017-11-03 | Discharge: 2017-11-03 | Disposition: A | Discharge status: Home / Self Care | Payer: 59 | Source: Ambulatory Visit | Attending: Adult Health | Admitting: Adult Health

## 2017-11-03 ENCOUNTER — Encounter: Payer: Self-pay | Admitting: Adult Health

## 2017-11-03 ENCOUNTER — Ambulatory Visit (INDEPENDENT_AMBULATORY_CARE_PROVIDER_SITE_OTHER): Payer: 59 | Admitting: Adult Health

## 2017-11-03 VITALS — BP 160/84 | HR 105 | Temp 98.1°F | Wt 252.2 lb

## 2017-11-03 DIAGNOSIS — K5900 Constipation, unspecified: Secondary | ICD-10-CM

## 2017-11-03 DIAGNOSIS — Z113 Encounter for screening for infections with a predominantly sexual mode of transmission: Secondary | ICD-10-CM | POA: Diagnosis not present

## 2017-11-03 MED ORDER — CEFTRIAXONE SODIUM 250 MG IJ SOLR
250.0000 mg | Freq: Once | INTRAMUSCULAR | Status: AC
  Administered 2017-11-03: 250 mg via INTRAMUSCULAR

## 2017-11-03 MED ORDER — AZITHROMYCIN 250 MG PO TABS: 1000.0000 mg | ORAL_TABLET | Freq: Once | ORAL | 0 refills | Status: AC

## 2017-11-03 NOTE — Progress Notes (Signed)
Subjective:    Patient ID: Caleb Garcia, male    DOB: 1966-01-30, 52 y.o.   MRN: [id number]  HPI  52 year old male who  has a past medical history of Cardiomyopathy, CHF (congestive heart failure) (HCC), Diabetes mellitus without complication (HCC), HTN (hypertension), Hyperlipidemia, Hypokalemia, Obesity, OSA (obstructive sleep apnea), Sleep apnea, and Systolic CHF (HCC).  He presents to the office today for an acute issue.   He reports that three days ago he received a phone call notifying him that his partner tested positive for chlamydia. He denies drainage or burning with urination but does report a feeling of " heaviness" in his penis and left side. He denies any blood in urine or history of kidney stones.   Does report being constipated   Review of Systems See HPI   Past Medical History:  Diagnosis Date  . Cardiomyopathy    Non-ischemic  . CHF (congestive heart failure) (HCC)   . Diabetes mellitus without complication (HCC)   . HTN (hypertension)   . Hyperlipidemia   . Hypokalemia   . Obesity   . OSA (obstructive sleep apnea)   . Sleep apnea    had surgery- no cpap  . Systolic CHF [facility])     Social History   Socioeconomic History  . Marital status: Married    Spouse name: Not on file  . Number of children: Not on file  . Years of education: Not on file  . Highest education level: Not on file  Occupational History    Employer: UNEMPLOYED  Social Needs  . Financial resource strain: Not on file  . Food insecurity:    Worry: Not on file    Inability: Not on file  . Transportation needs:    Medical: Not on file    Non-medical: Not on file  Tobacco Use  . Smoking status: Never Smoker  . Smokeless tobacco: Never Used  Substance and Sexual Activity  . Alcohol use: No  . Drug use: No  . Sexual activity: Not on file  Lifestyle  . Physical activity:    Days per week: Not on file    Minutes per session: Not on file  . Stress: Not on file  Relationships    . Social connections:    Talks on phone: Not on file    Gets together: Not on file    Attends religious service: Not on file    Active member of club or organization: Not on file    Attends meetings of clubs or organizations: Not on file    Relationship status: Not on file  . Intimate partner violence:    Fear of current or ex partner: Not on file    Emotionally abused: Not on file    Physically abused: Not on file    Forced sexual activity: Not on file  Other Topics Concern  . Not on file  Social History Narrative   Married 6 years   2 biologic daughters   2 stepsons, one Programme researcher, broadcasting/film/video for [employer]     Past Surgical History:  Procedure Laterality Date  . avulsion fracture of left hip    . CARDIAC CATHETERIZATION  10/08/08  . LEFT HEART CATH AND CORONARY ANGIOGRAPHY N/A 08/03/2016   Procedure: Left Heart Cath and Coronary Angiography;  Surgeon: Dolores Patty, MD;  Location: [facility] INVASIVE CV LAB;  Service: Cardiovascular;  Laterality: N/A;  . TONSILLECTOMY    . UVULOPALATOPHARYNGOPLASTY  Family History  Problem Relation Age of Onset  . Diabetes Mother   . Hypertension Mother   . Heart disease Mother   . Cancer Father        Throat cancer  . Stroke Father   . Stroke Brother 54  . Prostate cancer Neg Hx   . Colon cancer Neg Hx   . Colon polyps Neg Hx   . Esophageal cancer Neg Hx   . Rectal cancer Neg Hx   . Stomach cancer Neg Hx     Allergies  Allergen Reactions  . Shrimp [Shellfish Allergy] Swelling    Current Outpatient Medications on File Prior to Visit  Medication Sig Dispense Refill  . acetaminophen (TYLENOL) 500 MG tablet Take 500 mg by mouth every 6 (six) hours as needed for mild pain.    Marland Kitchen aspirin 81 MG tablet Take 81 mg by mouth daily.      Marland Kitchen atorvastatin (LIPITOR) 20 MG tablet Take 1 tablet (20 mg total) by mouth daily. 90 tablet 0  . Butenafine HCl 1 % cream Apply 1 application topically 2 (two) times daily. 30 g 1  . carvedilol (COREG)  25 MG tablet Take 1 tablet (25 mg total) by mouth 2 (two) times daily with a meal. 180 tablet 0  . carvedilol (COREG) 25 MG tablet TAKE ONE TABLET BY MOUTH TWICE DAILY WITH MEALS 60 tablet 3  . diclofenac sodium (VOLTAREN) 1 % GEL Apply 4 g topically 4 (four) times daily. 100 g 5  . felodipine (PLENDIL) 2.5 MG 24 hr tablet TAKE ONE TABLET(2.5mg ) BY MOUTH ONCE DAILY 90 tablet 3  . furosemide (LASIX) 40 MG tablet TAKE 1 TABLET BY MOUTH ONCE DAILY 90 tablet 1  . hydrALAZINE (APRESOLINE) 25 MG tablet TAKE ONE TABLET BY MOUTH THREE TIMES DAILY 90 tablet 0  . lisinopril (PRINIVIL,ZESTRIL) 40 MG tablet Take 1 tablet (40 mg total) by mouth daily. 90 tablet 3  . metFORMIN (GLUCOPHAGE) 500 MG tablet TAKE ONE TABLET BY MOUTH TWICE DAILY WITH MEALS (Patient taking differently: TAKE 2 TABLETs BY MOUTH TWICE DAILY WITH MEALS) 60 tablet 0  . metFORMIN (GLUCOPHAGE) 500 MG tablet Take 2 tablets (1,000 mg total) by mouth 2 (two) times daily with a meal. 360 tablet 0  . potassium chloride (KLOR-CON M10) 10 MEQ tablet TAKE 1 TABLET BY MOUTH ONCE DAILY 90 tablet 1  . spironolactone (ALDACTONE) 25 MG tablet TAKE 2 TABLETS BY MOUTH ONCE DAILY 180 tablet 1  . tadalafil (CIALIS) 20 MG tablet Take 1 tablet (20 mg total) by mouth daily as needed for erectile dysfunction. 10 tablet 3   No current facility-administered medications on file prior to visit.     BP (!) 160/84 (BP Location: Right Arm, Patient Position: Sitting, Cuff Size: Large)   Pulse (!) 105   Temp 98.1 F (36.7 C) (Oral)   Wt 252 lb 3.2 oz (114.4 kg)   SpO2 96%   BMI 37.79 kg/m       Objective:   Physical Exam  Constitutional: He is oriented to person, place, and time. He appears well-developed and well-nourished. No distress.  Cardiovascular: Normal rate and regular rhythm.  Abdominal: Soft. Bowel sounds are normal. He exhibits no mass. There is tenderness in the left lower quadrant. There is no rebound, no guarding and no CVA tenderness. No  hernia. Hernia confirmed negative in the right inguinal area and confirmed negative in the left inguinal area.  Hard stool felt LLQ  Genitourinary: Testes normal and penis  normal. Cremasteric reflex is present. Right testis shows no mass, no swelling and no tenderness. Left testis shows no mass, no swelling and no tenderness. Uncircumcised.  Neurological: He is alert and oriented to person, place, and time.  Skin: Skin is warm and dry. He is not diaphoretic.  Psychiatric: He has a normal mood and affect. His behavior is normal. Judgment and thought content normal.  Nursing note and vitals reviewed.     Assessment & Plan:  1. Screen for STD (sexually transmitted disease) - Will treat with prophylactic measures per patient request.  - HIV antibody - RPR - Urine cytology ancillary only - POC Urinalysis Dipstick - cefTRIAXone (ROCEPHIN) injection 250 mg - azithromycin (ZITHROMAX Z-PAK) 250 MG tablet; Take 4 tablets (1,000 mg total) by mouth once for 1 dose. Take 2 tablets on Day 1.  Then take 1 tablet daily.  Dispense: 4 tablet; Refill: 0  2. Constipation, unspecified constipation type - cause of left flank pressure - advised to drink more fluids and add more fiber to diet   Shirline Frees, NP

## 2017-11-04 ENCOUNTER — Telehealth: Payer: Self-pay | Admitting: Family Medicine

## 2017-11-04 LAB — HIV ANTIBODY (ROUTINE TESTING W REFLEX): HIV 1&2 Ab, 4th Generation: NONREACTIVE

## 2017-11-04 LAB — RPR: RPR Ser Ql: NONREACTIVE

## 2017-11-04 LAB — URINE CYTOLOGY ANCILLARY ONLY
CHLAMYDIA, DNA PROBE: NEGATIVE
NEISSERIA GONORRHEA: NEGATIVE

## 2017-11-04 NOTE — Telephone Encounter (Signed)
Spoke to Caleb Garcia at [facility] and notified her that the pt should take 1000 mg once.  No further action required.

## 2017-11-04 NOTE — Telephone Encounter (Signed)
Copied from CRM [id number]. Topic: General - Other >> Nov 04, 2017  9:25 AM Tamela Oddi wrote: Reason for CRM: Patient called to let doctor know that when he went to pick up his medication at pharmacy, they stated that they would have to call the doctor to get clarification on how the prescription was written.  The pharmacy wants the doctor to follow up and explain the script.  The pharmacy is [facility] [address], [place] - [address] [phone] (Phone) [phone] (Fax).  Patient's CB# [phone].      >> Nov 04, 2017 10:37 AM Floria Raveling A wrote: Pharmacy called in to verify the directions of this :  [facility]  Pharmacy [facility]  CB#   [phone]

## 2017-11-04 NOTE — Telephone Encounter (Signed)
Sorry about that   Take Azithromycin 1000 mg as a single dose

## 2017-11-04 NOTE — Telephone Encounter (Signed)
Assessment & Plan:  1. Screen for STD (sexually transmitted disease) - Will treat with prophylactic measures per patient request.  - HIV antibody - RPR - Urine cytology ancillary only - POC Urinalysis Dipstick - cefTRIAXone (ROCEPHIN) injection 250 mg - azithromycin (ZITHROMAX Z-PAK) 250 MG tablet; Take 4 tablets (1,000 mg total) by mouth once for 1 dose. Take 2 tablets on Day 1.  Then take 1 tablet daily.  Dispense: 4 tablet; Refill: 0   Cory, see above.  Directions are confusing.  What is correct?

## 2017-11-28 ENCOUNTER — Other Ambulatory Visit: Payer: Self-pay | Admitting: Adult Health

## 2017-11-28 DIAGNOSIS — Z760 Encounter for issue of repeat prescription: Secondary | ICD-10-CM

## 2017-11-28 DIAGNOSIS — I5022 Chronic systolic (congestive) heart failure: Secondary | ICD-10-CM

## 2017-11-29 NOTE — Telephone Encounter (Signed)
Denied.  Filled on 09/13/17 for 6 months.  Request is too early.

## 2017-12-14 ENCOUNTER — Other Ambulatory Visit: Payer: Self-pay | Admitting: Adult Health

## 2017-12-15 NOTE — Telephone Encounter (Signed)
Sent to the pharmacy by e-scribe for 30 days.  Pt due in Oct for follow up.

## 2017-12-28 ENCOUNTER — Other Ambulatory Visit: Payer: Self-pay | Admitting: Adult Health

## 2017-12-28 ENCOUNTER — Other Ambulatory Visit (HOSPITAL_COMMUNITY): Payer: Self-pay | Admitting: Internal Medicine

## 2017-12-28 DIAGNOSIS — E119 Type 2 diabetes mellitus without complications: Secondary | ICD-10-CM

## 2017-12-28 DIAGNOSIS — Z760 Encounter for issue of repeat prescription: Secondary | ICD-10-CM

## 2018-01-05 ENCOUNTER — Encounter (HOSPITAL_COMMUNITY): Payer: Self-pay | Admitting: Emergency Medicine

## 2018-01-05 ENCOUNTER — Ambulatory Visit (HOSPITAL_COMMUNITY)
Admission: EM | Admit: 2018-01-05 | Discharge: 2018-01-05 | Disposition: A | Discharge status: Home / Self Care | Payer: 59 | Attending: Family Medicine | Admitting: Family Medicine

## 2018-01-05 DIAGNOSIS — M25562 Pain in left knee: Secondary | ICD-10-CM

## 2018-01-05 DIAGNOSIS — M25561 Pain in right knee: Secondary | ICD-10-CM

## 2018-01-05 DIAGNOSIS — G8929 Other chronic pain: Secondary | ICD-10-CM

## 2018-01-05 MED ORDER — MELOXICAM 7.5 MG PO TABS: 7.5000 mg | ORAL_TABLET | Freq: Every day | ORAL | 0 refills | Status: DC

## 2018-01-05 NOTE — Discharge Instructions (Signed)
Regular activity as tolerated, as this can be helpful with joints.  Ice application, especially at the end of the day.  Meloxicam daily, take with food.  Please follow up with orthopedics for recheck as may benefit from further evaluation and treatment if symptoms persist.

## 2018-01-05 NOTE — ED Provider Notes (Signed)
[facility]    CSN: [id number] Arrival date & time: 01/05/18  4098     History   Chief Complaint Chief Complaint  Patient presents with  . Knee Pain    HPI Caleb Garcia is a 52 y.o. male.   [name] presents with complaints of bilateral knee pain which has been ongoing for years. Burning and achy. Worse when he first wakes up and with activity. No injury. 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 No redness or swelling. Has not taken any medications for symptoms. Pain 8/10. Has seen sports med in the past, was told may need knee replacement. Has not followed with them in some times. Has had left knee scoped in the past. Left knee worse than right. Hx of CHF, DM, hyperlipidemia, OSA.     ROS per HPI.      Past Medical History:  Diagnosis Date  . Cardiomyopathy    Non-ischemic  . CHF (congestive heart failure) (HCC)   . Diabetes mellitus without complication (HCC)   . HTN (hypertension)   . Hyperlipidemia   . Hypokalemia   . Obesity   . OSA (obstructive sleep apnea)   . Sleep apnea    had surgery- no cpap  . Systolic CHF [facility])     Patient Active Problem List   Diagnosis Date Noted  . Chest discomfort 07/28/2016  . Controlled type 2 diabetes mellitus without complication (HCC) 11/26/2013  . Bilateral shoulder pain 10/31/2013  . Erectile dysfunction 04/18/2013  . Mixed hyperlipidemia 11/26/2009  . LIMB PAIN 07/23/2009  . GERD 04/29/2009  . Obesity 10/22/2008  . OBSTRUCTIVE SLEEP APNEA 10/22/2008  . Essential hypertension 10/22/2008  . CARDIOMYOPATHY 10/22/2008  . CONGESTIVE HEART FAILURE, MILD 10/22/2008  . Chronic systolic heart failure (HCC) 10/16/2008    Past Surgical History:  Procedure Laterality Date  . avulsion fracture of left hip    . CARDIAC CATHETERIZATION  10/08/08  . LEFT HEART CATH AND CORONARY ANGIOGRAPHY N/A 08/03/2016   Procedure: Left Heart Cath and Coronary Angiography;  Surgeon: Dolores Patty, MD;   Location: [facility] INVASIVE CV LAB;  Service: Cardiovascular;  Laterality: N/A;  . TONSILLECTOMY    . UVULOPALATOPHARYNGOPLASTY         Home Medications    Prior to Admission medications   Medication Sig Start Date End Date Taking? Authorizing Provider  acetaminophen (TYLENOL) 500 MG tablet Take 500 mg by mouth every 6 (six) hours as needed for mild pain.    [provider]  aspirin 81 MG tablet Take 81 mg by mouth daily.      [provider]  atorvastatin (LIPITOR) 20 MG tablet Take 1 tablet (20 mg total) by mouth daily. 04/28/16   Nafziger, Kandee Keen, NP  Butenafine HCl 1 % cream Apply 1 application topically 2 (two) times daily. 07/06/17   Nafziger, Kandee Keen, NP  carvedilol (COREG) 25 MG tablet Take 1 tablet (25 mg total) by mouth 2 (two) times daily with a meal. 04/28/16   Nafziger, Kandee Keen, NP  carvedilol (COREG) 25 MG tablet TAKE ONE TABLET BY MOUTH TWICE DAILY WITH MEALS 12/29/16   Bensimhon, Bevelyn Buckles, MD  diclofenac sodium (VOLTAREN) 1 % GEL Apply 4 g topically 4 (four) times daily. 07/06/17   Nafziger, Kandee Keen, NP  felodipine (PLENDIL) 2.5 MG 24 hr tablet TAKE ONE TABLET(2.5mg ) BY MOUTH ONCE DAILY 07/06/17   Nafziger, Kandee Keen, NP  furosemide (LASIX) 40 MG tablet TAKE 1 TABLET BY MOUTH ONCE DAILY 07/27/17  Nafziger, Kandee Keen, NP  hydrALAZINE (APRESOLINE) 25 MG tablet TAKE 1 TABLET BY MOUTH THREE TIMES DAILY 12/15/17   Nafziger, Kandee Keen, NP  lisinopril (PRINIVIL,ZESTRIL) 40 MG tablet Take 1 tablet (40 mg total) by mouth daily. 07/06/17   Nafziger, Kandee Keen, NP  meloxicam (MOBIC) 7.5 MG tablet Take 1 tablet (7.5 mg total) by mouth daily. 01/05/18   Georgetta Haber, NP  metFORMIN (GLUCOPHAGE) 500 MG tablet TAKE ONE TABLET BY MOUTH TWICE DAILY WITH MEALS Patient taking differently: TAKE 2 TABLETs BY MOUTH TWICE DAILY WITH MEALS 05/10/17   Nafziger, Kandee Keen, NP  metFORMIN (GLUCOPHAGE) 500 MG tablet Take 2 tablets (1,000 mg total) by mouth 2 (two) times daily with a meal. 08/16/17   Nafziger, Kandee Keen, NP  potassium chloride  (KLOR-CON M10) 10 MEQ tablet TAKE 1 TABLET BY MOUTH ONCE DAILY 07/27/17   Nafziger, Kandee Keen, NP  spironolactone (ALDACTONE) 25 MG tablet TAKE 2 TABLETS BY MOUTH ONCE DAILY 09/13/17   Nafziger, Kandee Keen, NP  tadalafil (CIALIS) 20 MG tablet Take 1 tablet (20 mg total) by mouth daily as needed for erectile dysfunction. 07/06/17   Shirline Frees, NP    Family History Family History  Problem Relation Age of Onset  . Diabetes Mother   . Hypertension Mother   . Heart disease Mother   . Cancer Father        Throat cancer  . Stroke Father   . Stroke Brother 22  . Prostate cancer Neg Hx   . Colon cancer Neg Hx   . Colon polyps Neg Hx   . Esophageal cancer Neg Hx   . Rectal cancer Neg Hx   . Stomach cancer Neg Hx     Social History Social History   Tobacco Use  . Smoking status: Never Smoker  . Smokeless tobacco: Never Used  Substance Use Topics  . Alcohol use: No  . Drug use: No     Allergies   Shrimp [shellfish allergy]   Review of Systems Review of Systems   Physical Exam Triage Vital Signs ED Triage Vitals [01/05/18 1033]  Enc Vitals Group     BP 126/80     Pulse Rate 83     Resp 18     Temp 98.5 F (36.9 C)     Temp Source Oral     SpO2 98 %     Weight      Height      Head Circumference      Peak Flow      Pain Score      Pain Loc      Pain Edu?      Excl. in GC?    No data found.  Updated Vital Signs BP 126/80 (BP Location: Right Arm)   Pulse 83   Temp 98.5 F (36.9 C) (Oral)   Resp 18   SpO2 98%    Physical Exam  Constitutional: He is oriented to person, place, and time. He appears well-developed and well-nourished.  Cardiovascular: Normal rate and regular rhythm.  Pulmonary/Chest: Effort normal and breath sounds normal.  Musculoskeletal:       Right knee: He exhibits normal range of motion, no swelling, no effusion, no ecchymosis, no deformity, normal alignment, no LCL laxity, normal patellar mobility, no bony tenderness, normal meniscus and no MCL  laxity. Tenderness found. Medial joint line tenderness noted.       Left knee: He exhibits normal range of motion, no swelling, no effusion, no ecchymosis, no deformity, no erythema, normal alignment,  no LCL laxity, normal patellar mobility, no bony tenderness, normal meniscus and no MCL laxity. Tenderness found. Medial joint line tenderness noted.  Mild pain bilaterally with flexion and extension but no limitation to these movements; no obvious effusion present; no laxity; negative anterior drawer; tenderness primarily at medial knees bilaterally; strength equal bilaterally; gross sensation intact to bilateral lower extremities; ambulatory without difficulty   Neurological: He is alert and oriented to person, place, and time.  Skin: Skin is warm and dry.     UC Treatments / Results  Labs (all labs ordered are listed, but only abnormal results are displayed) Labs Reviewed - No data to display  EKG None  Radiology No results found.  Procedures Procedures (including critical care time)  Medications Ordered in UC Medications - No data to display  Initial Impression / Assessment and Plan / UC Course  I have reviewed the triage vital signs and the nursing notes.  Pertinent labs & imaging results that were available during my care of the patient were reviewed by me and considered in my medical decision making (see chart for details).     Chronic bilateral knee pain, consistent with arthritis. meloxicam daily. Encouraged activity as tolerated, ice application. To continue to follow with sports medicine for repeat eval and treat. Patient verbalized understanding and agreeable to plan.  Ambulatory out of clinic without difficulty.   Final Clinical Impressions(s) / UC Diagnoses   Final diagnoses:  Chronic pain of both knees     Discharge Instructions     Regular activity as tolerated, as this can be helpful with joints.  Ice application, especially at the end of the day.  Meloxicam  daily, take with food.  Please follow up with orthopedics for recheck as may benefit from further evaluation and treatment if symptoms persist.     ED Prescriptions    Medication Sig Dispense Auth. Provider   meloxicam (MOBIC) 7.5 MG tablet Take 1 tablet (7.5 mg total) by mouth daily. 20 tablet Georgetta Haber, NP     Controlled Substance Prescriptions [place] Controlled Substance Registry consulted? Not Applicable   Georgetta Haber, NP 01/05/18 1129

## 2018-01-05 NOTE — ED Triage Notes (Signed)
Pt c/o bilateral knee pain.

## 2018-03-01 ENCOUNTER — Other Ambulatory Visit (HOSPITAL_COMMUNITY): Payer: Self-pay | Admitting: Internal Medicine

## 2018-03-01 ENCOUNTER — Other Ambulatory Visit: Payer: Self-pay | Admitting: Adult Health

## 2018-03-01 DIAGNOSIS — Z760 Encounter for issue of repeat prescription: Secondary | ICD-10-CM

## 2018-03-01 DIAGNOSIS — I5022 Chronic systolic (congestive) heart failure: Secondary | ICD-10-CM

## 2018-03-01 DIAGNOSIS — E119 Type 2 diabetes mellitus without complications: Secondary | ICD-10-CM

## 2018-03-07 MED ORDER — CARVEDILOL 25 MG PO TABS: 25.0000 mg | ORAL_TABLET | Freq: Two times a day (BID) | ORAL | 1 refills | Status: DC

## 2018-03-07 MED ORDER — SPIRONOLACTONE 25 MG PO TABS: 50.0000 mg | ORAL_TABLET | Freq: Every day | ORAL | 1 refills | Status: DC

## 2018-03-07 NOTE — Telephone Encounter (Signed)
Called and spoke to the pt.  Scheduled him to see [facility] for A1C follow up on 03/12/18.  Nothing further needed.

## 2018-03-10 ENCOUNTER — Ambulatory Visit: Payer: Self-pay | Admitting: Adult Health

## 2018-03-10 NOTE — Progress Notes (Deleted)
Subjective:    Patient ID: Caleb Garcia, male    DOB: [birth year], 52 y.o.   MRN: [id number]  HPI  52 year old male who  has a past medical history of Cardiomyopathy, CHF (congestive heart failure) (HCC), Diabetes mellitus without complication (HCC), HTN (hypertension), Hyperlipidemia, Hypokalemia, Obesity, OSA (obstructive sleep apnea), Sleep apnea, and Systolic CHF (HCC).  He presents to the office today for follow up regarding DM. He is currently maintained on metformin 1000 mg twice daily.  During his last visit his A1c had increased from 6.7-7.1, we decided to increase metformin 500 mg twice daily to 1000 mg twice daily and have him work on lifestyle modifications. Lab Results  Component Value Date   HGBA1C 7.1 (H) 07/06/2017   Wt Readings from Last 3 Encounters:  11/03/17 252 lb 3.2 oz (114.4 kg)  07/06/17 242 lb (109.8 kg)  08/03/16 239 lb (108.4 kg)     Review of Systems See HPI   Past Medical History:  Diagnosis Date  . Cardiomyopathy    Non-ischemic  . CHF (congestive heart failure) (HCC)   . Diabetes mellitus without complication (HCC)   . HTN (hypertension)   . Hyperlipidemia   . Hypokalemia   . Obesity   . OSA (obstructive sleep apnea)   . Sleep apnea    had surgery- no cpap  . Systolic CHF [facility])     Social History   Socioeconomic History  . Marital status: Married    Spouse name: Not on file  . Number of children: Not on file  . Years of education: Not on file  . Highest education level: Not on file  Occupational History    Employer: UNEMPLOYED  Social Needs  . Financial resource strain: Not on file  . Food insecurity:    Worry: Not on file    Inability: Not on file  . Transportation needs:    Medical: Not on file    Non-medical: Not on file  Tobacco Use  . Smoking status: Never Smoker  . Smokeless tobacco: Never Used  Substance and Sexual Activity  . Alcohol use: No  . Drug use: No  . Sexual activity: Not on file  Lifestyle  .  Physical activity:    Days per week: Not on file    Minutes per session: Not on file  . Stress: Not on file  Relationships  . Social connections:    Talks on phone: Not on file    Gets together: Not on file    Attends religious service: Not on file    Active member of club or organization: Not on file    Attends meetings of clubs or organizations: Not on file    Relationship status: Not on file  . Intimate partner violence:    Fear of current or ex partner: Not on file    Emotionally abused: Not on file    Physically abused: Not on file    Forced sexual activity: Not on file  Other Topics Concern  . Not on file  Social History Narrative   Married 6 years   2 biologic daughters   2 stepsons, one Programme researcher, broadcasting/film/video for [employer]     Past Surgical History:  Procedure Laterality Date  . avulsion fracture of left hip    . CARDIAC CATHETERIZATION  10/08/08  . LEFT HEART CATH AND CORONARY ANGIOGRAPHY N/A 08/03/2016   Procedure: Left Heart Cath and Coronary Angiography;  Surgeon: Dolores Patty, MD;  Location: [facility] INVASIVE CV LAB;  Service: Cardiovascular;  Laterality: N/A;  . TONSILLECTOMY    . UVULOPALATOPHARYNGOPLASTY      Family History  Problem Relation Age of Onset  . Diabetes Mother   . Hypertension Mother   . Heart disease Mother   . Cancer Father        Throat cancer  . Stroke Father   . Stroke Brother 79  . Prostate cancer Neg Hx   . Colon cancer Neg Hx   . Colon polyps Neg Hx   . Esophageal cancer Neg Hx   . Rectal cancer Neg Hx   . Stomach cancer Neg Hx     Allergies  Allergen Reactions  . Shrimp [Shellfish Allergy] Swelling    Current Outpatient Medications on File Prior to Visit  Medication Sig Dispense Refill  . acetaminophen (TYLENOL) 500 MG tablet Take 500 mg by mouth every 6 (six) hours as needed for mild pain.    Marland Kitchen aspirin 81 MG tablet Take 81 mg by mouth daily.      Marland Kitchen atorvastatin (LIPITOR) 20 MG tablet Take 1 tablet (20 mg total) by mouth  daily. 90 tablet 0  . Butenafine HCl 1 % cream Apply 1 application topically 2 (two) times daily. 30 g 1  . carvedilol (COREG) 25 MG tablet TAKE ONE TABLET BY MOUTH TWICE DAILY WITH MEALS 60 tablet 3  . carvedilol (COREG) 25 MG tablet Take 1 tablet (25 mg total) by mouth 2 (two) times daily with a meal. 180 tablet 1  . diclofenac sodium (VOLTAREN) 1 % GEL Apply 4 g topically 4 (four) times daily. 100 g 5  . felodipine (PLENDIL) 2.5 MG 24 hr tablet TAKE ONE TABLET(2.5mg ) BY MOUTH ONCE DAILY 90 tablet 3  . furosemide (LASIX) 40 MG tablet TAKE 1 TABLET BY MOUTH ONCE DAILY 90 tablet 1  . hydrALAZINE (APRESOLINE) 25 MG tablet TAKE 1 TABLET BY MOUTH THREE TIMES DAILY 90 tablet 0  . lisinopril (PRINIVIL,ZESTRIL) 40 MG tablet Take 1 tablet (40 mg total) by mouth daily. 90 tablet 3  . meloxicam (MOBIC) 7.5 MG tablet Take 1 tablet (7.5 mg total) by mouth daily. 20 tablet 0  . metFORMIN (GLUCOPHAGE) 500 MG tablet TAKE ONE TABLET BY MOUTH TWICE DAILY WITH MEALS (Patient taking differently: TAKE 2 TABLETs BY MOUTH TWICE DAILY WITH MEALS) 60 tablet 0  . metFORMIN (GLUCOPHAGE) 500 MG tablet TAKE 2 TABLETS BY MOUTH TWICE DAILY WITH MEALS 360 tablet 0  . potassium chloride (KLOR-CON M10) 10 MEQ tablet TAKE 1 TABLET BY MOUTH ONCE DAILY 90 tablet 1  . spironolactone (ALDACTONE) 25 MG tablet Take 2 tablets (50 mg total) by mouth daily. 180 tablet 1  . tadalafil (CIALIS) 20 MG tablet Take 1 tablet (20 mg total) by mouth daily as needed for erectile dysfunction. 10 tablet 3   No current facility-administered medications on file prior to visit.     There were no vitals taken for this visit.      Objective:   Physical Exam  Constitutional: He is oriented to person, place, and time. He appears well-developed and well-nourished. No distress.  Cardiovascular: Normal rate, regular rhythm, normal heart sounds and intact distal pulses.  Pulmonary/Chest: Effort normal and breath sounds normal.  Musculoskeletal: Normal  range of motion.  Neurological: He is alert and oriented to person, place, and time.  Skin: Skin is warm and dry. Capillary refill takes less than 2 seconds. He is not diaphoretic.  Psychiatric: He has a normal  mood and affect. His behavior is normal. Judgment and thought content normal.  Nursing note and vitals reviewed.         Assessment & Plan:

## 2018-03-14 ENCOUNTER — Ambulatory Visit: Payer: Self-pay | Admitting: Adult Health

## 2018-03-16 ENCOUNTER — Ambulatory Visit (INDEPENDENT_AMBULATORY_CARE_PROVIDER_SITE_OTHER): Payer: 59 | Admitting: Adult Health

## 2018-03-16 ENCOUNTER — Encounter: Payer: Self-pay | Admitting: Adult Health

## 2018-03-16 VITALS — BP 118/76 | Temp 98.0°F | Wt 249.0 lb

## 2018-03-16 DIAGNOSIS — M25561 Pain in right knee: Secondary | ICD-10-CM

## 2018-03-16 DIAGNOSIS — Z760 Encounter for issue of repeat prescription: Secondary | ICD-10-CM | POA: Diagnosis not present

## 2018-03-16 DIAGNOSIS — Z1211 Encounter for screening for malignant neoplasm of colon: Secondary | ICD-10-CM | POA: Diagnosis not present

## 2018-03-16 DIAGNOSIS — G8929 Other chronic pain: Secondary | ICD-10-CM

## 2018-03-16 DIAGNOSIS — M25562 Pain in left knee: Secondary | ICD-10-CM

## 2018-03-16 DIAGNOSIS — E119 Type 2 diabetes mellitus without complications: Secondary | ICD-10-CM

## 2018-03-16 LAB — POCT GLYCOSYLATED HEMOGLOBIN (HGB A1C): HbA1c, POC (controlled diabetic range): 6.2 % (ref 0.0–7.0)

## 2018-03-16 MED ORDER — ATORVASTATIN CALCIUM 20 MG PO TABS: 20.0000 mg | ORAL_TABLET | Freq: Every day | ORAL | 3 refills | Status: DC

## 2018-03-16 MED ORDER — MELOXICAM 15 MG PO TABS: 15.0000 mg | ORAL_TABLET | Freq: Every day | ORAL | 1 refills | Status: AC

## 2018-03-16 NOTE — Progress Notes (Signed)
Subjective:    Patient ID: Caleb Garcia, male    DOB: [birth year], 52 y.o.   MRN: [id number]  HPI 52 year old male who  has a past medical history of Cardiomyopathy, CHF (congestive heart failure) (HCC), Diabetes mellitus without complication (HCC), HTN (hypertension), Hyperlipidemia, Hypokalemia, Obesity, OSA (obstructive sleep apnea), Sleep apnea, and Systolic CHF (HCC).  He presents to the office today for follow up regarding A1c. He is currently managed with Metformin 1000 mg BID. He gets a lot of physical activity at work but is working 12+ hours 5 days a week. He does not exercise at home. He has been eating healthier. His last A1c was 7.1 in April 2019   He continues to have chronic bilateral knee pain. He has been seen by [facility] orthopedics in the past who did injections in the knee but recommended knee replacement. He has not been back for follow up.   He also needs referred back to GI for screening colonoscopy.   Review of Systems See HPI   Past Medical History:  Diagnosis Date  . Cardiomyopathy    Non-ischemic  . CHF (congestive heart failure) (HCC)   . Diabetes mellitus without complication (HCC)   . HTN (hypertension)   . Hyperlipidemia   . Hypokalemia   . Obesity   . OSA (obstructive sleep apnea)   . Sleep apnea    had surgery- no cpap  . Systolic CHF [facility])     Social History   Socioeconomic History  . Marital status: Married    Spouse name: Not on file  . Number of children: Not on file  . Years of education: Not on file  . Highest education level: Not on file  Occupational History    Employer: UNEMPLOYED  Social Needs  . Financial resource strain: Not on file  . Food insecurity:    Worry: Not on file    Inability: Not on file  . Transportation needs:    Medical: Not on file    Non-medical: Not on file  Tobacco Use  . Smoking status: Never Smoker  . Smokeless tobacco: Never Used  Substance and Sexual Activity  . Alcohol use: No  . Drug  use: No  . Sexual activity: Not on file  Lifestyle  . Physical activity:    Days per week: Not on file    Minutes per session: Not on file  . Stress: Not on file  Relationships  . Social connections:    Talks on phone: Not on file    Gets together: Not on file    Attends religious service: Not on file    Active member of club or organization: Not on file    Attends meetings of clubs or organizations: Not on file    Relationship status: Not on file  . Intimate partner violence:    Fear of current or ex partner: Not on file    Emotionally abused: Not on file    Physically abused: Not on file    Forced sexual activity: Not on file  Other Topics Concern  . Not on file  Social History Narrative   Married 6 years   2 biologic daughters   2 stepsons, one Programme researcher, broadcasting/film/video for [employer]     Past Surgical History:  Procedure Laterality Date  . avulsion fracture of left hip    . CARDIAC CATHETERIZATION  10/08/08  . LEFT HEART CATH AND CORONARY ANGIOGRAPHY N/A 08/03/2016   Procedure: Left Heart  Cath and Coronary Angiography;  Surgeon: Dolores Patty, MD;  Location: [facility] INVASIVE CV LAB;  Service: Cardiovascular;  Laterality: N/A;  . TONSILLECTOMY    . UVULOPALATOPHARYNGOPLASTY      Family History  Problem Relation Age of Onset  . Diabetes Mother   . Hypertension Mother   . Heart disease Mother   . Cancer Father        Throat cancer  . Stroke Father   . Stroke Brother 27  . Prostate cancer Neg Hx   . Colon cancer Neg Hx   . Colon polyps Neg Hx   . Esophageal cancer Neg Hx   . Rectal cancer Neg Hx   . Stomach cancer Neg Hx     Allergies  Allergen Reactions  . Shrimp [Shellfish Allergy] Swelling    Current Outpatient Medications on File Prior to Visit  Medication Sig Dispense Refill  . acetaminophen (TYLENOL) 500 MG tablet Take 500 mg by mouth every 6 (six) hours as needed for mild pain.    Marland Kitchen aspirin 81 MG tablet Take 81 mg by mouth daily.      . Butenafine HCl 1 %  cream Apply 1 application topically 2 (two) times daily. 30 g 1  . carvedilol (COREG) 25 MG tablet Take 1 tablet (25 mg total) by mouth 2 (two) times daily with a meal. 180 tablet 1  . diclofenac sodium (VOLTAREN) 1 % GEL Apply 4 g topically 4 (four) times daily. 100 g 5  . felodipine (PLENDIL) 2.5 MG 24 hr tablet TAKE ONE TABLET(2.5mg ) BY MOUTH ONCE DAILY 90 tablet 3  . furosemide (LASIX) 40 MG tablet TAKE 1 TABLET BY MOUTH ONCE DAILY 90 tablet 1  . hydrALAZINE (APRESOLINE) 25 MG tablet TAKE 1 TABLET BY MOUTH THREE TIMES DAILY 90 tablet 0  . lisinopril (PRINIVIL,ZESTRIL) 40 MG tablet Take 1 tablet (40 mg total) by mouth daily. 90 tablet 3  . metFORMIN (GLUCOPHAGE) 500 MG tablet TAKE ONE TABLET BY MOUTH TWICE DAILY WITH MEALS (Patient taking differently: TAKE 2 TABLETs BY MOUTH TWICE DAILY WITH MEALS) 60 tablet 0  . potassium chloride (KLOR-CON M10) 10 MEQ tablet TAKE 1 TABLET BY MOUTH ONCE DAILY 90 tablet 1  . spironolactone (ALDACTONE) 25 MG tablet Take 2 tablets (50 mg total) by mouth daily. 180 tablet 1  . tadalafil (CIALIS) 20 MG tablet Take 1 tablet (20 mg total) by mouth daily as needed for erectile dysfunction. 10 tablet 3   No current facility-administered medications on file prior to visit.     BP 118/76   Temp 98 F (36.7 C)   Wt 249 lb (112.9 kg)   BMI 37.31 kg/m       Objective:   Physical Exam Vitals signs and nursing note reviewed.  Constitutional:      Appearance: Normal appearance.  Cardiovascular:     Rate and Rhythm: Normal rate and regular rhythm.  Pulmonary:     Effort: Pulmonary effort is normal.     Breath sounds: Normal breath sounds.  Musculoskeletal:        General: No swelling, tenderness, deformity or signs of injury.     Right lower leg: No edema.     Left lower leg: No edema.  Skin:    General: Skin is warm and dry.     Capillary Refill: Capillary refill takes less than 2 seconds.  Neurological:     General: No focal deficit present.     Mental  Status: He is alert.  Assessment & Plan:  1. Controlled type 2 diabetes mellitus without complication, without long-term current use of insulin (HCC)  - POCT A1C- 6.2  - has improved - Continue with current treatment   2. Medication refill  - atorvastatin (LIPITOR) 20 MG tablet; Take 1 tablet (20 mg total) by mouth daily.  Dispense: 90 tablet; Refill: 3  3. Chronic pain of both knees - Will trial on Mobic 15 mg daily    4. Screen for colon cancer  - Ambulatory referral to Gastroenterology  Shirline Frees, NP

## 2018-04-17 ENCOUNTER — Other Ambulatory Visit: Payer: Self-pay | Admitting: Adult Health

## 2018-04-17 DIAGNOSIS — Z760 Encounter for issue of repeat prescription: Secondary | ICD-10-CM

## 2018-04-18 NOTE — Telephone Encounter (Signed)
Pt taking both furosemide and spironolactone?

## 2018-06-13 ENCOUNTER — Encounter: Payer: Self-pay | Admitting: Adult Health

## 2018-06-20 ENCOUNTER — Encounter: Payer: Self-pay | Admitting: Adult Health

## 2018-06-20 ENCOUNTER — Ambulatory Visit (INDEPENDENT_AMBULATORY_CARE_PROVIDER_SITE_OTHER): Payer: 59 | Admitting: Adult Health

## 2018-06-20 ENCOUNTER — Ambulatory Visit (INDEPENDENT_AMBULATORY_CARE_PROVIDER_SITE_OTHER): Payer: 59

## 2018-06-20 ENCOUNTER — Other Ambulatory Visit: Payer: Self-pay | Admitting: Family Medicine

## 2018-06-20 ENCOUNTER — Other Ambulatory Visit: Payer: Self-pay

## 2018-06-20 VITALS — BP 122/80 | Temp 98.2°F | Wt 257.0 lb

## 2018-06-20 DIAGNOSIS — M25562 Pain in left knee: Secondary | ICD-10-CM

## 2018-06-20 DIAGNOSIS — M25561 Pain in right knee: Secondary | ICD-10-CM

## 2018-06-20 DIAGNOSIS — M1710 Unilateral primary osteoarthritis, unspecified knee: Secondary | ICD-10-CM

## 2018-06-20 MED ORDER — METHYLPREDNISOLONE ACETATE 80 MG/ML IJ SUSP: 80.0000 mg | Freq: Once | INTRAMUSCULAR | Status: DC

## 2018-06-20 MED ORDER — METHYLPREDNISOLONE ACETATE 80 MG/ML IJ SUSP
80.0000 mg | Freq: Once | INTRAMUSCULAR | Status: AC
  Administered 2018-06-20: 80 mg via INTRA_ARTICULAR

## 2018-06-20 NOTE — Addendum Note (Signed)
Addended by: Raj Janus T on: 06/20/2018 10:53 AM   Modules accepted: Orders

## 2018-06-20 NOTE — Progress Notes (Signed)
Subjective:    Patient ID: Caleb Garcia, male    DOB: [birth year], 53 y.o.   MRN: [id number]  HPI 53 year old male who  has a past medical history of Cardiomyopathy, CHF (congestive heart failure) (HCC), Diabetes mellitus without complication (HCC), HTN (hypertension), Hyperlipidemia, Hypokalemia, Obesity, OSA (obstructive sleep apnea), Sleep apnea, and Systolic CHF (HCC).  He presents to the office today for chronic bilateral knee pain but worse in the right that has been present for multiple years at this point. Reports pain has been becoming worse. He has reports pain is worse in the morning and feels as though he has trouble walking. As the day progresses the pain improves slightly. Has noticed swelling to right knee at times. No redness or warmth.   Last xray was in 07/2016   IMPRESSION: 1. Possible osteochondritis dissecans involving the medial femoral condyle. MRI of the knee without contrast may be helpful to confirm or deny this finding. 2. Stable mild osteoarthritis involving the medial and lateral compartments.  He has been using antiinflammatories without relief   Has had steroid injections in the past and reports " sometimes they work and sometimes they don't".   Review of Systems See HPI   Past Medical History:  Diagnosis Date  . Cardiomyopathy    Non-ischemic  . CHF (congestive heart failure) (HCC)   . Diabetes mellitus without complication (HCC)   . HTN (hypertension)   . Hyperlipidemia   . Hypokalemia   . Obesity   . OSA (obstructive sleep apnea)   . Sleep apnea    had surgery- no cpap  . Systolic CHF [facility])     Social History   Socioeconomic History  . Marital status: Married    Spouse name: Not on file  . Number of children: Not on file  . Years of education: Not on file  . Highest education level: Not on file  Occupational History    Employer: UNEMPLOYED  Social Needs  . Financial resource strain: Not on file  . Food insecurity:    Worry:  Not on file    Inability: Not on file  . Transportation needs:    Medical: Not on file    Non-medical: Not on file  Tobacco Use  . Smoking status: Never Smoker  . Smokeless tobacco: Never Used  Substance and Sexual Activity  . Alcohol use: No  . Drug use: No  . Sexual activity: Not on file  Lifestyle  . Physical activity:    Days per week: Not on file    Minutes per session: Not on file  . Stress: Not on file  Relationships  . Social connections:    Talks on phone: Not on file    Gets together: Not on file    Attends religious service: Not on file    Active member of club or organization: Not on file    Attends meetings of clubs or organizations: Not on file    Relationship status: Not on file  . Intimate partner violence:    Fear of current or ex partner: Not on file    Emotionally abused: Not on file    Physically abused: Not on file    Forced sexual activity: Not on file  Other Topics Concern  . Not on file  Social History Narrative   Married 6 years   2 biologic daughters   2 stepsons, one Programme researcher, broadcasting/film/video for [employer]     Past Surgical History:  Procedure Laterality Date  . avulsion fracture of left hip    . CARDIAC CATHETERIZATION  10/08/08  . LEFT HEART CATH AND CORONARY ANGIOGRAPHY N/A 08/03/2016   Procedure: Left Heart Cath and Coronary Angiography;  Surgeon: Dolores Patty, MD;  Location[facility] INVASIVE CV LAB;  Service: Cardiovascular;  Laterality: N/A;  . TONSILLECTOMY    . UVULOPALATOPHARYNGOPLASTY      Family History  Problem Relation Age of Onset  . Diabetes Mother   . Hypertension Mother   . Heart disease Mother   . Cancer Father        Throat cancer  . Stroke Father   . Stroke Brother 8  . Prostate cancer Neg Hx   . Colon cancer Neg Hx   . Colon polyps Neg Hx   . Esophageal cancer Neg Hx   . Rectal cancer Neg Hx   . Stomach cancer Neg Hx     Allergies  Allergen Reactions  . Shrimp [Shellfish Allergy] Swelling    Current  Outpatient Medications on File Prior to Visit  Medication Sig Dispense Refill  . acetaminophen (TYLENOL) 500 MG tablet Take 500 mg by mouth every 6 (six) hours as needed for mild pain.    Marland Kitchen aspirin 81 MG tablet Take 81 mg by mouth daily.      Marland Kitchen atorvastatin (LIPITOR) 20 MG tablet Take 1 tablet (20 mg total) by mouth daily. 90 tablet 3  . Butenafine HCl 1 % cream Apply 1 application topically 2 (two) times daily. 30 g 1  . carvedilol (COREG) 25 MG tablet Take 1 tablet (25 mg total) by mouth 2 (two) times daily with a meal. 180 tablet 1  . diclofenac sodium (VOLTAREN) 1 % GEL Apply 4 g topically 4 (four) times daily. 100 g 5  . felodipine (PLENDIL) 2.5 MG 24 hr tablet TAKE ONE TABLET(2.5mg ) BY MOUTH ONCE DAILY 90 tablet 3  . furosemide (LASIX) 40 MG tablet TAKE 1 TABLET BY MOUTH ONCE DAILY 90 tablet 3  . hydrALAZINE (APRESOLINE) 25 MG tablet TAKE 1 TABLET BY MOUTH THREE TIMES DAILY 90 tablet 3  . lisinopril (PRINIVIL,ZESTRIL) 40 MG tablet Take 1 tablet (40 mg total) by mouth daily. 90 tablet 3  . metFORMIN (GLUCOPHAGE) 500 MG tablet TAKE ONE TABLET BY MOUTH TWICE DAILY WITH MEALS (Patient taking differently: TAKE 2 TABLETs BY MOUTH TWICE DAILY WITH MEALS) 60 tablet 0  . potassium chloride (K-DUR,KLOR-CON) 10 MEQ tablet TAKE 1 TABLET BY MOUTH ONCE DAILY 90 tablet 3  . spironolactone (ALDACTONE) 25 MG tablet Take 2 tablets (50 mg total) by mouth daily. 180 tablet 1  . tadalafil (CIALIS) 20 MG tablet Take 1 tablet (20 mg total) by mouth daily as needed for erectile dysfunction. 10 tablet 3   No current facility-administered medications on file prior to visit.     BP 122/80   Temp 98.2 F (36.8 C)   Wt 257 lb (116.6 kg)   BMI 38.51 kg/m       Objective:   Physical Exam Vitals signs and nursing note reviewed.  Constitutional:      Appearance: Normal appearance.  Cardiovascular:     Rate and Rhythm: Normal rate and regular rhythm.     Pulses: Normal pulses.     Heart sounds: Normal heart  sounds.  Pulmonary:     Effort: Pulmonary effort is normal.     Breath sounds: Normal breath sounds.  Abdominal:     General: Abdomen is flat.  Palpations: Abdomen is soft.  Musculoskeletal:        General: No swelling, tenderness or deformity.     Right lower leg: No edema.     Left lower leg: No edema.  Skin:    General: Skin is warm and dry.     Capillary Refill: Capillary refill takes less than 2 seconds.  Neurological:     General: No focal deficit present.     Mental Status: He is alert and oriented to person, place, and time.       Assessment & Plan:  1. Acute pain of both knees - Will trial injecting right knee today. Ultimately will likely need knee replacement   - DG Knee 1-2 Views Right; Future - DG Knee 1-2 Views Left; Future - methylPREDNISolone acetate (DEPO-MEDROL) injection 80 mg - AMB referral to orthopedics  Discussed risks and benefits of corticosteroid injection and patient consented.  After prepping skin with betadine, injected 80 mg depomedrol and 2 cc of plain xylocaine with 22 gauge one and one half inch needle using anterolateral approach and pt tolerated well.    Shirline Frees, NP

## 2018-07-06 ENCOUNTER — Telehealth (INDEPENDENT_AMBULATORY_CARE_PROVIDER_SITE_OTHER): Payer: Self-pay

## 2018-07-06 NOTE — Telephone Encounter (Signed)
Called patient. No answer Couldn't leave VM mailbox is full.  If they return call, please ask them screening questions below. Thank you.   Do you have now or have you had in the past 7 days a fever and/or chills?   Do you have now or have you had in the past 7 days a cough?   Do you have now or have you had in the last 7 days nausea, vomiting or abdominal pain?   Have you been exposed to anyone who has tested positive for COVID-19?   Have you or anyone who lives with you traveled within the last month?

## 2018-07-10 ENCOUNTER — Ambulatory Visit (INDEPENDENT_AMBULATORY_CARE_PROVIDER_SITE_OTHER): Payer: 59 | Admitting: Orthopedic Surgery

## 2018-07-10 ENCOUNTER — Other Ambulatory Visit: Payer: Self-pay

## 2018-07-10 ENCOUNTER — Encounter (INDEPENDENT_AMBULATORY_CARE_PROVIDER_SITE_OTHER): Payer: Self-pay | Admitting: Orthopedic Surgery

## 2018-07-10 DIAGNOSIS — M1711 Unilateral primary osteoarthritis, right knee: Secondary | ICD-10-CM | POA: Diagnosis not present

## 2018-07-10 DIAGNOSIS — M1712 Unilateral primary osteoarthritis, left knee: Secondary | ICD-10-CM

## 2018-07-10 NOTE — Progress Notes (Signed)
Office Visit Note   Patient: Caleb Garcia           Date of Birth: Mar 16, 1966           MRN: [id number] Visit Date: 07/10/2018 Requested by: Shirline Frees, NP [address] [place], [place] [address] PCP: Shirline Frees, NP  Subjective: Chief Complaint  Patient presents with  . Right Knee - Pain  . Left Knee - Pain    HPI: Caleb Garcia is a 53 year old patient with long history of right and left knee pain.  I reviewed his x-rays from 2018 which shows early mild arthritis with cystic changes in that medial femoral condyle.  He is having more severe pain over the past 3 weeks.  Had an injection 3 weeks ago which is giving him some relief but not sustained relief.  He works doing physical type work.  Does report stiffness when getting up out of a chair or seat after long period of sitting.  Does report night pain as well              ROS: All systems reviewed are negative as they relate to the chief complaint within the history of present illness.  Patient denies  fevers or chills.   Assessment & Plan: Visit Diagnoses:  1. Unilateral primary osteoarthritis, left knee   2. Unilateral primary osteoarthritis, right knee     Plan: Impression is right knee pain left knee pain with significant arthritic change from radiographs done 2 weeks ago compared to radiographs from 2 years ago.  He has an effusion today.  He has actually pretty reasonable range of motion and no significant flexion contracture yet but at this time I want to get an MRI scan to make sure there is nothing arthroscopically treatable in the knee and if not then his options would be gel injection which would give him about as much relief is is cortisone shot versus knee replacement which is not to be undertaken lightly in a patient and his age group.  We also discussed quad strengthening exercises and weight loss which he has said that he is done.  I will see him back after that study  Follow-Up Instructions: Return for  after MRI.   Orders:  Orders Placed This Encounter  Procedures  . MR Knee Right w/o contrast   No orders of the defined types were placed in this encounter.     Procedures: No procedures performed   Clinical Data: No additional findings.  Objective: Vital Signs: There were no vitals taken for this visit.  Physical Exam:   Constitutional: Patient appears well-developed HEENT:  Head: Normocephalic Eyes:EOM are normal Neck: Normal range of motion Cardiovascular: Normal rate Pulmonary/chest: Effort normal Neurologic: Patient is alert Skin: Skin is warm Psychiatric: Patient has normal mood and affect    Ortho Exam: Ortho exam demonstrates slightly antalgic gait to the right with palpable pedal pulses.  Mild effusion of the right knee with no effusion in the left knee.  Patellofemoral crepitus more on the right than the left.  Collateral crucial ligaments are stable.  There is global knee pain on the right compared to the left.  No groin pain with internal X rotation of the leg.  Specialty Comments:  No specialty comments available.  Imaging: No results found.   PMFS History: Patient Active Problem List   Diagnosis Date Noted  . Chest discomfort 07/28/2016  . Controlled type 2 diabetes mellitus without complication (HCC) 11/26/2013  . Bilateral shoulder pain  10/31/2013  . Erectile dysfunction 04/18/2013  . Mixed hyperlipidemia 11/26/2009  . LIMB PAIN 07/23/2009  . GERD 04/29/2009  . Obesity 10/22/2008  . OBSTRUCTIVE SLEEP APNEA 10/22/2008  . Essential hypertension 10/22/2008  . CARDIOMYOPATHY 10/22/2008  . CONGESTIVE HEART FAILURE, MILD 10/22/2008  . Chronic systolic heart failure (HCC) 10/16/2008   Past Medical History:  Diagnosis Date  . Cardiomyopathy    Non-ischemic  . CHF (congestive heart failure) (HCC)   . Diabetes mellitus without complication (HCC)   . HTN (hypertension)   . Hyperlipidemia   . Hypokalemia   . Obesity   . OSA (obstructive  sleep apnea)   . Sleep apnea    had surgery- no cpap  . Systolic CHF (HCC)     Family History  Problem Relation Age of Onset  . Diabetes Mother   . Hypertension Mother   . Heart disease Mother   . Cancer Father        Throat cancer  . Stroke Father   . Stroke Brother 42  . Prostate cancer Neg Hx   . Colon cancer Neg Hx   . Colon polyps Neg Hx   . Esophageal cancer Neg Hx   . Rectal cancer Neg Hx   . Stomach cancer Neg Hx     Past Surgical History:  Procedure Laterality Date  . avulsion fracture of left hip    . CARDIAC CATHETERIZATION  10/08/08  . LEFT HEART CATH AND CORONARY ANGIOGRAPHY N/A 08/03/2016   Procedure: Left Heart Cath and Coronary Angiography;  Surgeon: Dolores Patty, MD;  Location: [facility] INVASIVE CV LAB;  Service: Cardiovascular;  Laterality: N/A;  . TONSILLECTOMY    . UVULOPALATOPHARYNGOPLASTY     Social History   Occupational History    Employer: UNEMPLOYED  Tobacco Use  . Smoking status: Never Smoker  . Smokeless tobacco: Never Used  Substance and Sexual Activity  . Alcohol use: No  . Drug use: No  . Sexual activity: Not on file

## 2018-07-13 ENCOUNTER — Other Ambulatory Visit: Payer: Self-pay | Admitting: Adult Health

## 2018-07-13 DIAGNOSIS — Z760 Encounter for issue of repeat prescription: Secondary | ICD-10-CM

## 2018-07-21 ENCOUNTER — Ambulatory Visit (INDEPENDENT_AMBULATORY_CARE_PROVIDER_SITE_OTHER): Payer: 59

## 2018-07-21 ENCOUNTER — Encounter: Payer: Self-pay | Admitting: Adult Health

## 2018-07-21 ENCOUNTER — Other Ambulatory Visit: Payer: Self-pay

## 2018-07-21 ENCOUNTER — Ambulatory Visit (INDEPENDENT_AMBULATORY_CARE_PROVIDER_SITE_OTHER): Payer: 59 | Admitting: Adult Health

## 2018-07-21 DIAGNOSIS — M79672 Pain in left foot: Secondary | ICD-10-CM

## 2018-07-21 DIAGNOSIS — M79671 Pain in right foot: Secondary | ICD-10-CM

## 2018-07-21 NOTE — Progress Notes (Signed)
Virtual Visit via Video Note  I connected with Caleb Garcia on 07/21/18 at 11:30 AM EDT by a video enabled telemedicine application and verified that I am speaking with the correct person using two identifiers.  Location patient: home Location provider:work or home office Persons participating in the virtual visit: patient, provider  I discussed the limitations of evaluation and management by telemedicine and the availability of in person appointments. The patient expressed understanding and agreed to proceed.   HPI: 53 year old male who is being evaluated today for bilateral foot pain.  Reports his pain started approximately 1 week ago on the bottoms of bilateral feet.  Per patient "feels like I am walking on golf balls".  He does state that he feels" knots" on the bottom of his feet more towards the front in the middle of his bilateral feet.  Feels as  though these "knots" come and go   ROS: See pertinent positives and negatives per HPI.  Past Medical History:  Diagnosis Date  . Cardiomyopathy    Non-ischemic  . CHF (congestive heart failure) (HCC)   . Diabetes mellitus without complication (HCC)   . HTN (hypertension)   . Hyperlipidemia   . Hypokalemia   . Obesity   . OSA (obstructive sleep apnea)   . Sleep apnea    had surgery- no cpap  . Systolic CHF [facility])     Past Surgical History:  Procedure Laterality Date  . avulsion fracture of left hip    . CARDIAC CATHETERIZATION  10/08/08  . LEFT HEART CATH AND CORONARY ANGIOGRAPHY N/A 08/03/2016   Procedure: Left Heart Cath and Coronary Angiography;  Surgeon: Dolores Patty, MD;  Location: [facility] INVASIVE CV LAB;  Service: Cardiovascular;  Laterality: N/A;  . TONSILLECTOMY    . UVULOPALATOPHARYNGOPLASTY      Family History  Problem Relation Age of Onset  . Diabetes Mother   . Hypertension Mother   . Heart disease Mother   . Cancer Father        Throat cancer  . Stroke Father   . Stroke Brother 75  . Prostate cancer Neg  Hx   . Colon cancer Neg Hx   . Colon polyps Neg Hx   . Esophageal cancer Neg Hx   . Rectal cancer Neg Hx   . Stomach cancer Neg Hx      Current Outpatient Medications:  .  acetaminophen (TYLENOL) 500 MG tablet, Take 500 mg by mouth every 6 (six) hours as needed for mild pain., Disp: , Rfl:  .  aspirin 81 MG tablet, Take 81 mg by mouth daily.  , Disp: , Rfl:  .  atorvastatin (LIPITOR) 20 MG tablet, Take 1 tablet (20 mg total) by mouth daily., Disp: 90 tablet, Rfl: 3 .  Butenafine HCl 1 % cream, Apply 1 application topically 2 (two) times daily., Disp: 30 g, Rfl: 1 .  carvedilol (COREG) 25 MG tablet, Take 1 tablet (25 mg total) by mouth 2 (two) times daily with a meal., Disp: 180 tablet, Rfl: 1 .  diclofenac sodium (VOLTAREN) 1 % GEL, Apply 4 g topically 4 (four) times daily., Disp: 100 g, Rfl: 5 .  felodipine (PLENDIL) 2.5 MG 24 hr tablet, Take 1 tablet by mouth once daily, Disp: 90 tablet, Rfl: 0 .  furosemide (LASIX) 40 MG tablet, TAKE 1 TABLET BY MOUTH ONCE DAILY, Disp: 90 tablet, Rfl: 3 .  hydrALAZINE (APRESOLINE) 25 MG tablet, TAKE 1 TABLET BY MOUTH THREE TIMES DAILY, Disp: 90 tablet, Rfl: 3 .  lisinopril (PRINIVIL,ZESTRIL) 40 MG tablet, Take 1 tablet by mouth once daily, Disp: 90 tablet, Rfl: 0 .  metFORMIN (GLUCOPHAGE) 500 MG tablet, TAKE ONE TABLET BY MOUTH TWICE DAILY WITH MEALS (Patient taking differently: TAKE 2 TABLETs BY MOUTH TWICE DAILY WITH MEALS), Disp: 60 tablet, Rfl: 0 .  potassium chloride (K-DUR,KLOR-CON) 10 MEQ tablet, TAKE 1 TABLET BY MOUTH ONCE DAILY, Disp: 90 tablet, Rfl: 3 .  spironolactone (ALDACTONE) 25 MG tablet, Take 2 tablets (50 mg total) by mouth daily., Disp: 180 tablet, Rfl: 1 .  tadalafil (CIALIS) 20 MG tablet, Take 1 tablet (20 mg total) by mouth daily as needed for erectile dysfunction., Disp: 10 tablet, Rfl: 3  EXAM:  VITALS per patient if applicable:  GENERAL: alert, oriented, appears well and in no acute distress  HEENT: atraumatic, conjunttiva  clear, no obvious abnormalities on inspection of external nose and ears  NECK: normal movements of the head and neck  LUNGS: on inspection no signs of respiratory distress, breathing rate appears normal, no obvious gross SOB, gasping or wheezing  CV: no obvious cyanosis  MS: moves all visible extremities without noticeable abnormality  PSYCH/NEURO: pleasant and cooperative, no obvious depression or anxiety, speech and thought processing grossly intact  ASSESSMENT AND PLAN:  Discussed the following assessment and plan:  Bilateral foot pain - Plan: DG Foot Complete Right, DG Foot Complete Left  Get x-rays for possible plantar fibromas or bone spurs. Consider referral to Podiatry    I discussed the assessment and treatment plan with the patient. The patient was provided an opportunity to ask questions and all were answered. The patient agreed with the plan and demonstrated an understanding of the instructions.   The patient was advised to call back or seek an in-person evaluation if the symptoms worsen or if the condition fails to improve as anticipated.   Shirline Frees, NP

## 2018-07-25 ENCOUNTER — Other Ambulatory Visit: Payer: Self-pay | Admitting: Family Medicine

## 2018-07-25 DIAGNOSIS — M79671 Pain in right foot: Secondary | ICD-10-CM

## 2018-07-25 DIAGNOSIS — M79672 Pain in left foot: Principal | ICD-10-CM

## 2018-07-27 ENCOUNTER — Emergency Department (HOSPITAL_COMMUNITY): Payer: 59

## 2018-07-27 ENCOUNTER — Encounter (HOSPITAL_COMMUNITY): Payer: Self-pay | Admitting: Emergency Medicine

## 2018-07-27 ENCOUNTER — Ambulatory Visit: Payer: 59 | Admitting: Podiatry

## 2018-07-27 ENCOUNTER — Emergency Department (HOSPITAL_COMMUNITY)
Admission: EM | Admit: 2018-07-27 | Discharge: 2018-07-27 | Disposition: A | Discharge status: Home / Self Care | Payer: 59 | Attending: Emergency Medicine | Admitting: Emergency Medicine

## 2018-07-27 ENCOUNTER — Other Ambulatory Visit: Payer: Self-pay

## 2018-07-27 DIAGNOSIS — R0789 Other chest pain: Secondary | ICD-10-CM | POA: Diagnosis not present

## 2018-07-27 DIAGNOSIS — I5022 Chronic systolic (congestive) heart failure: Secondary | ICD-10-CM | POA: Insufficient documentation

## 2018-07-27 DIAGNOSIS — I10 Essential (primary) hypertension: Secondary | ICD-10-CM | POA: Diagnosis not present

## 2018-07-27 DIAGNOSIS — Z7982 Long term (current) use of aspirin: Secondary | ICD-10-CM | POA: Diagnosis not present

## 2018-07-27 DIAGNOSIS — Z79899 Other long term (current) drug therapy: Secondary | ICD-10-CM | POA: Insufficient documentation

## 2018-07-27 DIAGNOSIS — Z7984 Long term (current) use of oral hypoglycemic drugs: Secondary | ICD-10-CM | POA: Insufficient documentation

## 2018-07-27 DIAGNOSIS — E119 Type 2 diabetes mellitus without complications: Secondary | ICD-10-CM | POA: Diagnosis not present

## 2018-07-27 DIAGNOSIS — K219 Gastro-esophageal reflux disease without esophagitis: Secondary | ICD-10-CM | POA: Insufficient documentation

## 2018-07-27 DIAGNOSIS — R079 Chest pain, unspecified: Secondary | ICD-10-CM

## 2018-07-27 LAB — TROPONIN I
Troponin I: <0.03 ng/mL (ref <0.03)
Troponin I: <0.03 ng/mL (ref <0.03)

## 2018-07-27 LAB — BASIC METABOLIC PANEL
Anion gap: 11 (ref 5–15)
BUN: 19 mg/dL (ref 6–20)
CO2: 23 mmol/L (ref 22–32)
Calcium: 9.2 mg/dL (ref 8.9–10.3)
Chloride: 103 mmol/L (ref 98–111)
Creatinine, Ser: 0.97 mg/dL (ref 0.61–1.24)
GFR calc Af Amer: >60 mL/min (ref >60)
GFR calc non Af Amer: >60 mL/min (ref >60)
Glucose, Bld: 132 mg/dL — ABNORMAL HIGH (ref 70–99)
Potassium: 4 mmol/L (ref 3.5–5.1)
Sodium: 137 mmol/L (ref 135–145)

## 2018-07-27 LAB — CBC
HCT: 41.6 % (ref 39.0–52.0)
Hemoglobin: 13.8 g/dL (ref 13.0–17.0)
MCH: 30.7 pg (ref 26.0–34.0)
MCHC: 33.2 g/dL (ref 30.0–36.0)
MCV: 92.4 fL (ref 80.0–100.0)
Platelets: 235 10*3/uL (ref 150–400)
RBC: 4.5 MIL/uL (ref 4.22–5.81)
RDW: 13.1 % (ref 11.5–15.5)
WBC: 6 10*3/uL (ref 4.0–10.5)
nRBC: 0 % (ref 0.0–0.2)

## 2018-07-27 MED ORDER — ONDANSETRON HCL 4 MG/2ML IJ SOLN
4.0000 mg | Freq: Once | INTRAMUSCULAR | Status: AC
  Administered 2018-07-27: 4 mg via INTRAVENOUS
  Filled 2018-07-27: qty 2

## 2018-07-27 MED ORDER — ASPIRIN 81 MG PO CHEW
324.0000 mg | CHEWABLE_TABLET | Freq: Once | ORAL | Status: AC
  Administered 2018-07-27: 324 mg via ORAL
  Filled 2018-07-27: qty 4

## 2018-07-27 MED ORDER — MORPHINE SULFATE (PF) 4 MG/ML IV SOLN
4.0000 mg | Freq: Once | INTRAVENOUS | Status: AC
  Administered 2018-07-27: 15:00:00 4 mg via INTRAVENOUS
  Filled 2018-07-27: qty 1

## 2018-07-27 NOTE — ED Triage Notes (Signed)
Pt arrives from home complaining of intermittent chest pain that began yesterday and has progressed to constant pain today.

## 2018-07-27 NOTE — ED Provider Notes (Signed)
[facility] EMERGENCY DEPARTMENT Provider Note   CSN: [id number] Arrival date & time: 07/27/18  1418    History   Chief Complaint Chief Complaint  Patient presents with  . Chest Pain    HPI Caleb Garcia is a 53 y.o. male with a hx of CHF last EF 45-50% during cardiac cath 2018, HTN, hyperlipidemia, T2DM, sleep apnea,  And GERD who presents to the ED with complaints of chest pain that began yesterday at 12:00. Patient states that he was pulling something with the LUE somewhat exerting himself when he developed non-radiating pain to the L chest describes as a pressure. Initially pain was intermittent, but then this AM it became constant. Current pain is a 6/10 in severity without alleviating/aggravating factors. No worse w/ repeat exertion since pulling. No associated sxs. Denies nausea, vomiting, diaphoresis, lightheadedness, dyspnea, syncope, or leg pain/swelling. States he had similar pain related to his heart. Primary cardiologist is Dr. Gala Romney    HPI  Past Medical History:  Diagnosis Date  . Cardiomyopathy    Non-ischemic  . CHF (congestive heart failure) (HCC)   . Diabetes mellitus without complication (HCC)   . HTN (hypertension)   . Hyperlipidemia   . Hypokalemia   . Obesity   . OSA (obstructive sleep apnea)   . Sleep apnea    had surgery- no cpap  . Systolic CHF [facility])     Patient Active Problem List   Diagnosis Date Noted  . Chest discomfort 07/28/2016  . Controlled type 2 diabetes mellitus without complication (HCC) 11/26/2013  . Bilateral shoulder pain 10/31/2013  . Erectile dysfunction 04/18/2013  . Mixed hyperlipidemia 11/26/2009  . LIMB PAIN 07/23/2009  . GERD 04/29/2009  . Obesity 10/22/2008  . OBSTRUCTIVE SLEEP APNEA 10/22/2008  . Essential hypertension 10/22/2008  . CARDIOMYOPATHY 10/22/2008  . CONGESTIVE HEART FAILURE, MILD 10/22/2008  . Chronic systolic heart failure (HCC) 10/16/2008    Past Surgical History:  Procedure  Laterality Date  . avulsion fracture of left hip    . CARDIAC CATHETERIZATION  10/08/08  . LEFT HEART CATH AND CORONARY ANGIOGRAPHY N/A 08/03/2016   Procedure: Left Heart Cath and Coronary Angiography;  Surgeon: Dolores Patty, MD;  Location: [facility] INVASIVE CV LAB;  Service: Cardiovascular;  Laterality: N/A;  . TONSILLECTOMY    . UVULOPALATOPHARYNGOPLASTY          Home Medications    Prior to Admission medications   Medication Sig Start Date End Date Taking? Authorizing Provider  acetaminophen (TYLENOL) 500 MG tablet Take 500 mg by mouth every 6 (six) hours as needed for mild pain.    [provider]  aspirin 81 MG tablet Take 81 mg by mouth daily.      [provider]  atorvastatin (LIPITOR) 20 MG tablet Take 1 tablet (20 mg total) by mouth daily. 03/16/18   Nafziger, Kandee Keen, NP  Butenafine HCl 1 % cream Apply 1 application topically 2 (two) times daily. 07/06/17   Nafziger, Kandee Keen, NP  carvedilol (COREG) 25 MG tablet Take 1 tablet (25 mg total) by mouth 2 (two) times daily with a meal. 03/07/18   Nafziger, Kandee Keen, NP  diclofenac sodium (VOLTAREN) 1 % GEL Apply 4 g topically 4 (four) times daily. 07/06/17   Nafziger, Kandee Keen, NP  felodipine (PLENDIL) 2.5 MG 24 hr tablet Take 1 tablet by mouth once daily 07/13/18   Shirline Frees, NP  furosemide (LASIX) 40 MG tablet TAKE 1 TABLET BY MOUTH ONCE DAILY 04/19/18   Nafziger, [place],  NP  hydrALAZINE (APRESOLINE) 25 MG tablet TAKE 1 TABLET BY MOUTH THREE TIMES DAILY 04/19/18   Nafziger, Kandee Keen, NP  lisinopril (PRINIVIL,ZESTRIL) 40 MG tablet Take 1 tablet by mouth once daily 07/13/18   Nafziger, Kandee Keen, NP  metFORMIN (GLUCOPHAGE) 500 MG tablet TAKE ONE TABLET BY MOUTH TWICE DAILY WITH MEALS Patient taking differently: TAKE 2 TABLETs BY MOUTH TWICE DAILY WITH MEALS 05/10/17   Nafziger, Kandee Keen, NP  potassium chloride (K-DUR,KLOR-CON) 10 MEQ tablet TAKE 1 TABLET BY MOUTH ONCE DAILY 04/19/18   Nafziger, Kandee Keen, NP  spironolactone (ALDACTONE) 25 MG tablet Take 2  tablets (50 mg total) by mouth daily. 03/07/18   Nafziger, Kandee Keen, NP  tadalafil (CIALIS) 20 MG tablet Take 1 tablet (20 mg total) by mouth daily as needed for erectile dysfunction. 07/06/17   Shirline Frees, NP    Family History Family History  Problem Relation Age of Onset  . Diabetes Mother   . Hypertension Mother   . Heart disease Mother   . Cancer Father        Throat cancer  . Stroke Father   . Stroke Brother 105  . Prostate cancer Neg Hx   . Colon cancer Neg Hx   . Colon polyps Neg Hx   . Esophageal cancer Neg Hx   . Rectal cancer Neg Hx   . Stomach cancer Neg Hx     Social History Social History   Tobacco Use  . Smoking status: Never Smoker  . Smokeless tobacco: Never Used  Substance Use Topics  . Alcohol use: No  . Drug use: No     Allergies   Other and Shrimp [shellfish allergy]   Review of Systems Review of Systems  Constitutional: Negative for chills, diaphoresis and fever.  Respiratory: Negative for shortness of breath.   Cardiovascular: Positive for chest pain. Negative for palpitations and leg swelling.  Gastrointestinal: Negative for abdominal pain, nausea and vomiting.  Neurological: Negative for dizziness, syncope, weakness, light-headedness, numbness and headaches.  All other systems reviewed and are negative.  Physical Exam Updated Vital Signs BP 132/69 (BP Location: Left Arm)   Pulse 73   Temp 98.3 F (36.8 C) (Oral)   Resp 15   Ht  (1.778 m)   Wt 111.1 kg   SpO2 97%   BMI 35.15 kg/m   Physical Exam Vitals signs and nursing note reviewed.  Constitutional:      General: He is not in acute distress.    Appearance: He is well-developed. He is not toxic-appearing.  HENT:     Head: Normocephalic and atraumatic.  Eyes:     General:        Right eye: No discharge.        Left eye: No discharge.     Conjunctiva/sclera: Conjunctivae normal.  Neck:     Musculoskeletal: Neck supple.  Cardiovascular:     Rate and Rhythm: Normal rate  and regular rhythm.     Pulses:          Radial pulses are 2+ on the right side and 2+ on the left side.  Pulmonary:     Effort: Pulmonary effort is normal. No respiratory distress.     Breath sounds: Normal breath sounds. No wheezing, rhonchi or rales.  Chest:     Chest wall: No tenderness.  Abdominal:     General: There is no distension.     Palpations: Abdomen is soft.     Tenderness: There is no abdominal tenderness.  Skin:  General: Skin is warm and dry.     Findings: No rash.  Neurological:     Mental Status: He is alert.     Comments: Clear speech.   Psychiatric:        Behavior: Behavior normal.    ED Treatments / Results  Labs (all labs ordered are listed, but only abnormal results are displayed) Labs Reviewed  BASIC METABOLIC PANEL - Abnormal; Notable for the following components:      Result Value   Glucose, Bld 132 (*)    All other components within normal limits  CBC  TROPONIN I    EKG EKG Interpretation  Date/Time:  Thursday July 27 2018 14:32:54 EDT Ventricular Rate:  76 PR Interval:    QRS Duration: 86 QT Interval:  367 QTC Calculation: 413 R Axis:   9 Text Interpretation:  Sinus rhythm Borderline prolonged PR interval Abnormal T, consider ischemia, diffuse leads No significant change since last tracing Confirmed by Tilden Fossa [phone]) on 07/27/2018 3:41:00 PM   Radiology Dg Chest 2 View  Result Date: 07/27/2018 CLINICAL DATA:  Chest pain. EXAM: CHEST - 2 VIEW COMPARISON:  Chest x-ray dated September 29, 2012. FINDINGS: The heart remains at the upper limits of normal in size. Normal mediastinal contours. Normal pulmonary vascularity. No focal consolidation, pleural effusion, or pneumothorax. No acute osseous abnormality. IMPRESSION: No active cardiopulmonary disease. Electronically Signed   By: Obie Dredge M.D.   On: 07/27/2018 15:32    Procedures Procedures (including critical care time)  Medications Ordered in ED Medications  aspirin  chewable tablet 324 mg (324 mg Oral Given 07/27/18 1449)  morphine 4 MG/ML injection 4 mg (4 mg Intravenous Given 07/27/18 1453)  ondansetron (ZOFRAN) injection 4 mg (4 mg Intravenous Given 07/27/18 1450)   L heart cath 08/2016:  1. Normal coronary arteries 2. EF ~50%  3. Normal LVEDP   Continue medical management.   Arvilla Meres, MD  10:27 AM  Initial Impression / Assessment and Plan / ED Course  I have reviewed the triage vital signs and the nursing notes.  Pertinent labs & imaging results that were available during my care of the patient were reviewed by me and considered in my medical decision making (see chart for details).    Patient presents to the emergency department with chest pain. Patient nontoxic appearing, in no apparent distress, vitals without significant abnormality. Fairly benign physical exam. DDX: ACS, pulmonary embolism, dissection, pneumothorax, anemia, electrolyte derangement, MSK, GERD, anxiety. Evaluation initiated with labs, EKG, and CXR. Patient on cardiac monitor.   Work-up in the ER reviewed:  CBC: No anemia/leukocytosis BMP: Mild hyperglycemia @ 132. Troponin: WNL EKG: No significant change  CXR: Negative, without infiltrate, effusion, pneumothorax, or fracture/dislocation.   Low risk Wells, doubt PE.  Pain is not a tearing sensation, symmetric pulses, no widened mediastinum on chest x-ray, doubt dissection.  Patient does have multiple cardiac risk factors however he had cardiac catheterization in May of 2018 with normal coronary arteries, pain does not worsen w/ exertion, initial trop negative, EKG without significant change from prior. Plan for delta trop- if negative plan for discharge home with cardiology follow up.   16:00: Patient care signed out to [facility] PA-C at change of shift pending delta-troponin.   Findings and plan of care discussed with supervising physician Dr. Madilyn Hook who is in agreement.   Final Clinical Impressions(s) /  ED Diagnoses   Final diagnoses:  Chest pain, unspecified type    ED Discharge Orders  None       Desmond Lope 07/27/18 1557    Tilden Fossa, MD 07/28/18 [phone]

## 2018-07-27 NOTE — ED Provider Notes (Signed)
I assumed care of patient from previous team at shift change, please see their note for full H&P.  Briefly patient is here for evaluation of chest pain since yesterday at noon.  He was pulling something with his left arm when he developed left-sided chest pain.   Physical Exam  BP 135/84   Pulse 76   Temp 98.3 F (36.8 C) (Oral)   Resp 19   Ht 5\' 10"  (1.778 m)   Wt 111.1 kg   SpO2 100%   BMI 35.15 kg/m   Physical Exam Vitals signs and nursing note reviewed.  Constitutional:      Appearance: He is not toxic-appearing.  Cardiovascular:     Rate and Rhythm: Normal rate.  Neurological:     General: No focal deficit present.     Mental Status: He is alert and oriented to person, place, and time.  Psychiatric:        Mood and Affect: Mood normal.     ED Course/Procedures     Procedures   Labs Reviewed  BASIC METABOLIC PANEL - Abnormal; Notable for the following components:      Result Value   Glucose, Bld 132 (*)    All other components within normal limits  CBC  TROPONIN I  TROPONIN I     MDM  At the time I assumed care of patient work-up is complete other than delta troponin.  If delta troponin is normal anticipate discharge home.  Delta troponin is not elevated.  Return precautions were discussed with patient who states their understanding.  At the time of discharge patient denied any unaddressed complaints or concerns.  Patient is agreeable for discharge home.       Cristina Gong, Cordelia Poche 07/27/18 Lynelle Smoke    Raeford Razor, MD 07/28/18 2021

## 2018-07-27 NOTE — Discharge Instructions (Addendum)
You were seen in the emergency department today for chest pain. Your work-up in the emergency department has been overall reassuring. Your labs have been fairly normal and or similar to previous blood work you have had done. Your EKG and the enzyme we use to check your heart did not show an acute heart attack at this time. Your chest x-ray was normal.  ° °We would like you to follow up closely with your primary care provider and/or the cardiologist provided in your discharge instructions within 1-3 days. Return to the ER immediately should you experience any new or worsening symptoms including but not limited to return of pain, worsened pain, vomiting, shortness of breath, dizziness, lightheadedness, passing out, or any other concerns that you may have.   °

## 2018-08-10 ENCOUNTER — Ambulatory Visit: Payer: 59 | Admitting: Podiatry

## 2018-08-31 ENCOUNTER — Ambulatory Visit: Payer: 59 | Admitting: Podiatry

## 2018-10-29 ENCOUNTER — Other Ambulatory Visit: Payer: Self-pay | Admitting: Adult Health

## 2018-10-29 DIAGNOSIS — Z760 Encounter for issue of repeat prescription: Secondary | ICD-10-CM

## 2018-10-29 DIAGNOSIS — I5022 Chronic systolic (congestive) heart failure: Secondary | ICD-10-CM

## 2018-10-31 NOTE — Telephone Encounter (Signed)
DENIED.  WILL NEED CPX AND FASTING LABS.

## 2018-11-16 ENCOUNTER — Other Ambulatory Visit: Payer: Self-pay | Admitting: Adult Health

## 2018-11-16 DIAGNOSIS — I5022 Chronic systolic (congestive) heart failure: Secondary | ICD-10-CM

## 2018-11-16 DIAGNOSIS — Z760 Encounter for issue of repeat prescription: Secondary | ICD-10-CM

## 2018-11-16 NOTE — Telephone Encounter (Signed)
See requests.

## 2018-11-16 NOTE — Telephone Encounter (Signed)
Pharm  has faxed this over for several days.  Please fill asap  Send to [facility] on [address]

## 2018-11-20 ENCOUNTER — Telehealth: Payer: Self-pay | Admitting: Adult Health

## 2018-11-20 DIAGNOSIS — I5022 Chronic systolic (congestive) heart failure: Secondary | ICD-10-CM

## 2018-11-20 DIAGNOSIS — Z760 Encounter for issue of repeat prescription: Secondary | ICD-10-CM

## 2018-11-20 NOTE — Telephone Encounter (Signed)
felodipine (PLENDIL) 2.5 MG 24 hr tablet pironolactone (ALDACTONE) 25 MG tablet  lisinopril (PRINIVIL,ZESTRIL) 40 MG tablet   [address] [place], [place]  [place] [address]

## 2018-11-21 ENCOUNTER — Encounter: Payer: Self-pay | Admitting: Family Medicine

## 2018-11-21 MED ORDER — FELODIPINE ER 2.5 MG PO TB24: ORAL_TABLET | ORAL | 0 refills | Status: DC

## 2018-11-21 MED ORDER — SPIRONOLACTONE 25 MG PO TABS: 50.0000 mg | ORAL_TABLET | Freq: Every day | ORAL | 0 refills | Status: DC

## 2018-11-21 MED ORDER — LISINOPRIL 40 MG PO TABS: 40.0000 mg | ORAL_TABLET | Freq: Every day | ORAL | 0 refills | Status: DC

## 2018-11-21 NOTE — Telephone Encounter (Signed)
Sent to the pharmacy by e-scribe for 30 days.  Pt is past due for cpx and fasting lab work.  Letter mailed to the pt.

## 2018-11-21 NOTE — Telephone Encounter (Signed)
Potassium filled for 1 year 04/19/2018.  Request is too early.  All others filled for 30 days earlier today.

## 2018-12-07 ENCOUNTER — Telehealth: Payer: Self-pay | Admitting: Family Medicine

## 2018-12-07 NOTE — Telephone Encounter (Signed)
We can take a same day slot next Wednesday

## 2018-12-07 NOTE — Telephone Encounter (Signed)
Tommi Rumps, would you like to see the pt next week or have him go back to ortho?  Has seen Dr Marlou Sa in the past.  Please advise.

## 2018-12-07 NOTE — Telephone Encounter (Signed)
Copied from [place] [phone]. Topic: Appointment Scheduling - Scheduling Inquiry for Clinic >> Dec 07, 2018  8:21 AM Scherrie Gerlach wrote: Reason for CRM: pt states he is having severe knee pain and states Tommi Rumps has given him shot in the knee in the past, and he needs another. Pt would like appt asap. Only virtual and a same day @ 11:30 on Friday. Please advise if OK. thanks

## 2018-12-08 NOTE — Telephone Encounter (Signed)
Tried reaching the pt by telephone.  Received a message that this person does not have a voicemail box that has been set up.  Unable to leave a message.  Will try again at a later time.

## 2018-12-13 NOTE — Telephone Encounter (Signed)
Pt is scheduled for 12/14/2018.  Nothing further needed.

## 2018-12-14 ENCOUNTER — Ambulatory Visit (INDEPENDENT_AMBULATORY_CARE_PROVIDER_SITE_OTHER): Payer: 59 | Admitting: Adult Health

## 2018-12-14 ENCOUNTER — Other Ambulatory Visit: Payer: Self-pay

## 2018-12-14 ENCOUNTER — Encounter: Payer: Self-pay | Admitting: Adult Health

## 2018-12-14 VITALS — BP 104/62 | Temp 98.2°F | Wt 256.0 lb

## 2018-12-14 DIAGNOSIS — M170 Bilateral primary osteoarthritis of knee: Secondary | ICD-10-CM

## 2018-12-14 DIAGNOSIS — Z760 Encounter for issue of repeat prescription: Secondary | ICD-10-CM

## 2018-12-14 MED ORDER — METHYLPREDNISOLONE ACETATE 80 MG/ML IJ SUSP
80.0000 mg | Freq: Once | INTRAMUSCULAR | Status: AC
  Administered 2018-12-14: 80 mg via INTRA_ARTICULAR

## 2018-12-14 MED ORDER — FELODIPINE ER 2.5 MG PO TB24: ORAL_TABLET | ORAL | 0 refills | Status: DC

## 2018-12-14 MED ORDER — SPIRONOLACTONE 25 MG PO TABS: 50.0000 mg | ORAL_TABLET | Freq: Every day | ORAL | 0 refills | Status: DC

## 2018-12-14 MED ORDER — CARVEDILOL 25 MG PO TABS: 25.0000 mg | ORAL_TABLET | Freq: Two times a day (BID) | ORAL | 0 refills | Status: DC

## 2018-12-14 MED ORDER — METFORMIN HCL 500 MG PO TABS: 500.0000 mg | ORAL_TABLET | Freq: Two times a day (BID) | ORAL | 0 refills | Status: DC

## 2018-12-14 NOTE — Progress Notes (Signed)
Subjective:    Patient ID: Caleb Garcia, male    DOB: 09-18-1965, 53 y.o.   MRN: [id number]  HPI  53 year old male who  has a past medical history of Cardiomyopathy, CHF (congestive heart failure) (HCC), Diabetes mellitus without complication (HCC), HTN (hypertension), Hyperlipidemia, Hypokalemia, Obesity, OSA (obstructive sleep apnea), Sleep apnea, and Systolic CHF (HCC).  He presents to the office today for chronic knee pain.  He last had steroid injection in March 2020 and got some relief from this but the relief was not sustained.  He does do physical work.  He does report stiffness when getting out of a chair or seat.  Pain is become worse over the last few weeks and is unable to get restful sleep due to the pain.  He was seen by Dr. August Saucer in April 2020.  MRI of the knee was ordered but this was never done.  His most recent x-rays of bilateral knees was done in March which showed  Right knee-tricompartment degenerative changes most notable medial tibiofemoral joint space Left Knee- -tricompartment degenerative changes and suspect loose bodies within the suprapatellar region  He would like to see another orthopedic for a second opinion   He also needs his medications renewed   Review of Systems See HPI   Past Medical History:  Diagnosis Date  . Cardiomyopathy    Non-ischemic  . CHF (congestive heart failure) (HCC)   . Diabetes mellitus without complication (HCC)   . HTN (hypertension)   . Hyperlipidemia   . Hypokalemia   . Obesity   . OSA (obstructive sleep apnea)   . Sleep apnea    had surgery- no cpap  . Systolic CHF [facility])     Social History   Socioeconomic History  . Marital status: Married    Spouse name: Not on file  . Number of children: Not on file  . Years of education: Not on file  . Highest education level: Not on file  Occupational History    Employer: UNEMPLOYED  Social Needs  . Financial resource strain: Not on file  . Food insecurity   Worry: Not on file    Inability: Not on file  . Transportation needs    Medical: Not on file    Non-medical: Not on file  Tobacco Use  . Smoking status: Never Smoker  . Smokeless tobacco: Never Used  Substance and Sexual Activity  . Alcohol use: No  . Drug use: No  . Sexual activity: Not on file  Lifestyle  . Physical activity    Days per week: Not on file    Minutes per session: Not on file  . Stress: Not on file  Relationships  . Social Musician on phone: Not on file    Gets together: Not on file    Attends religious service: Not on file    Active member of club or organization: Not on file    Attends meetings of clubs or organizations: Not on file    Relationship status: Not on file  . Intimate partner violence    Fear of current or ex partner: Not on file    Emotionally abused: Not on file    Physically abused: Not on file    Forced sexual activity: Not on file  Other Topics Concern  . Not on file  Social History Narrative   Married 6 years   2 biologic daughters   2 stepsons, one Programme researcher, broadcasting/film/video for  [employer]     Past Surgical History:  Procedure Laterality Date  . avulsion fracture of left hip    . CARDIAC CATHETERIZATION  10/08/08  . LEFT HEART CATH AND CORONARY ANGIOGRAPHY N/A 08/03/2016   Procedure: Left Heart Cath and Coronary Angiography;  Surgeon: Dolores Pattyaniel R Bensimhon, MD;  [facility];  Service: Cardiovascular;  Laterality: N/A;  . TONSILLECTOMY    . UVULOPALATOPHARYNGOPLASTY      Family History  Problem Relation Age of Onset  . Diabetes Mother   . Hypertension Mother   . Heart disease Mother   . Cancer Father        Throat cancer  . Stroke Father   . Stroke Brother [id number]  . Prostate cancer Neg Hx   . Colon cancer Neg Hx   . Colon polyps Neg Hx   . Esophageal cancer Neg Hx   . Rectal cancer Neg Hx   . Stomach cancer Neg Hx     Allergies  Allergen Reactions  . Other     Almonds-caused swelling in hands  .  Shrimp [Shellfish Allergy] Swelling    Current Outpatient Medications on File Prior to Visit  Medication Sig Dispense Refill  . acetaminophen (TYLENOL) 500 MG tablet Take 500 mg by mouth every 6 (six) hours as needed for mild pain.    Marland Kitchen. aspirin 81 MG tablet Take 81 mg by mouth daily.      Marland Kitchen. atorvastatin (LIPITOR) 20 MG tablet Take 1 tablet (20 mg total) by mouth daily. (Patient not taking: Reported on 07/27/2018) 90 tablet 3  . Butenafine HCl 1 % cream Apply 1 application topically 2 (two) times daily. (Patient not taking: Reported on 07/27/2018) 30 g 1  . carvedilol (COREG) 25 MG tablet Take 1 tablet (25 mg total) by mouth 2 (two) times daily with a meal. 180 tablet 1  . Chlorpheniramine-DM (CORICIDIN HBP COUGH/COLD PO) Take 1 tablet by mouth every 12 (twelve) hours as needed (cold symptoms).    . diclofenac sodium (VOLTAREN) 1 % GEL Apply 4 g topically 4 (four) times daily. (Patient not taking: Reported on 07/27/2018) 100 g 5  . felodipine (PLENDIL) 2.5 MG 24 hr tablet Take 1 tablet by mouth once daily 30 tablet 0  . furosemide (LASIX) 40 MG tablet TAKE 1 TABLET BY MOUTH ONCE DAILY (Patient taking differently: Take 40 mg by mouth daily. ) 90 tablet 3  . hydrALAZINE (APRESOLINE) 25 MG tablet TAKE 1 TABLET BY MOUTH THREE TIMES DAILY (Patient taking differently: Take 25 mg by mouth 3 (three) times daily. ) 90 tablet 3  . lisinopril (ZESTRIL) 40 MG tablet Take 1 tablet (40 mg total) by mouth daily. 30 tablet 0  . metFORMIN (GLUCOPHAGE) 500 MG tablet TAKE ONE TABLET BY MOUTH TWICE DAILY WITH MEALS (Patient taking differently: Take 500 mg by mouth 2 (two) times daily with a meal. ) 60 tablet 0  . potassium chloride (K-DUR,KLOR-CON) 10 MEQ tablet TAKE 1 TABLET BY MOUTH ONCE DAILY (Patient taking differently: Take 10 mEq by mouth daily. TAKE 1 TABLET BY MOUTH ONCE DAILY) 90 tablet 3  . spironolactone (ALDACTONE) 25 MG tablet Take 2 tablets (50 mg total) by mouth daily. 60 tablet 0  . tadalafil (CIALIS) 20  MG tablet Take 1 tablet (20 mg total) by mouth daily as needed for erectile dysfunction. 10 tablet 3   No current facility-administered medications on file prior to visit.     BP 104/62   Temp 98.2 F (36.8  C)   Wt 256 lb (116.1 kg)   BMI 36.73 kg/m       Objective:   Physical Exam Vitals signs and nursing note reviewed.  Constitutional:      Appearance: Normal appearance.  Cardiovascular:     Rate and Rhythm: Normal rate and regular rhythm.     Pulses: Normal pulses.     Heart sounds: Normal heart sounds.  Pulmonary:     Effort: Pulmonary effort is normal.     Breath sounds: Normal breath sounds.  Abdominal:     General: Abdomen is flat.     Palpations: Abdomen is soft.  Musculoskeletal:        General: Tenderness (throughout bilateral knees) present. No swelling.     Right lower leg: No edema.     Left lower leg: No edema.  Skin:    General: Skin is warm and dry.     Capillary Refill: Capillary refill takes less than 2 seconds.  Neurological:     General: No focal deficit present.     Mental Status: He is alert and oriented to person, place, and time.  Psychiatric:        Mood and Affect: Mood normal.        Behavior: Behavior normal.        Thought Content: Thought content normal.        Judgment: Judgment normal.       Assessment & Plan:  1. Primary osteoarthritis of both knees Discussed risks and benefits of corticosteroid injection and patient consented.  After prepping skin with betadine, injected 80 mg depomedrol and 2 cc of plain xylocaine with 22 gauge one and one half inch needle using anterolateral approach into bilateral knees and pt tolerated well.  - methylPREDNISolone acetate (DEPO-MEDROL) injection 80 mg - methylPREDNISolone acetate (DEPO-MEDROL) injection 80 mg - AMB referral to orthopedics  2. Medication refill  - carvedilol (COREG) 25 MG tablet; Take 1 tablet (25 mg total) by mouth 2 (two) times daily with a meal.  Dispense: 180 tablet;  Refill: 0 - felodipine (PLENDIL) 2.5 MG 24 hr tablet; Take 1 tablet by mouth once daily  Dispense: 90 tablet; Refill: 0 - metFORMIN (GLUCOPHAGE) 500 MG tablet; Take 1 tablet (500 mg total) by mouth 2 (two) times daily with a meal.  Dispense: 180 tablet; Refill: 0 - spironolactone (ALDACTONE) 25 MG tablet; Take 2 tablets (50 mg total) by mouth daily.  Dispense: 180 tablet; Refill: 0  Dorothyann Peng, NP

## 2018-12-14 NOTE — Patient Instructions (Signed)
Please follow up in three months or sooner for your physical exam   I will send in your medications today   I will refer you [facility] - Dr. Maureen Ralphs for your knees.

## 2019-01-02 ENCOUNTER — Other Ambulatory Visit: Payer: Self-pay | Admitting: Adult Health

## 2019-01-02 DIAGNOSIS — Z760 Encounter for issue of repeat prescription: Secondary | ICD-10-CM

## 2019-01-02 NOTE — Telephone Encounter (Signed)
Sent to the pharmacy by e-scribe for 90 days.  Pt has upcoming cpx on 02/15/2019.

## 2019-01-02 NOTE — Telephone Encounter (Signed)
Pt called requesting a call back from [place] or PA Sharyn Lull?) please advise. Pt says he needs refill today Best contact : [phone]

## 2019-02-15 ENCOUNTER — Encounter: Payer: 59 | Admitting: Adult Health

## 2019-02-15 NOTE — Progress Notes (Deleted)
Subjective:    Patient ID: Caleb Garcia, male    DOB: [birth year], 53 y.o.   MRN: [id number]  HPI Patient presents for yearly preventative medicine examination. He is a pleasant 53 year old male who  has a past medical history of Cardiomyopathy, CHF (congestive heart failure) ([place]), Diabetes mellitus without complication ([place]), HTN (hypertension), Hyperlipidemia, Hypokalemia, Obesity, OSA (obstructive sleep apnea), Sleep apnea, and Systolic CHF ([place]).  DM - controlled with metformin 500 mg BID.  He denies nausea, vomiting, diarrhea, or hypoglycemic events. Lab Results  Component Value Date   HGBA1C 6.2 03/16/2018   Hyperlipidemia -currently prescribed Lipitor 20 mg and aspirin 81 mg.  He denies myalgia or fatigue Lab Results  Component Value Date   CHOL 190 07/06/2017   HDL 44.30 07/06/2017   LDLCALC 98 05/04/2016   LDLDIRECT 91.0 07/06/2017   TRIG 216.0 (H) 07/06/2017   CHOLHDL 4 07/06/2017   Hypertension/CHF/Cardiomyopathy -currently prescribed lisinopril 40 mg, Coreg 25 mg, hydralazine 25 mg 3 times daily, Lasix 40 mg, as well as spironolactone 50 mg. BP Readings from Last 3 Encounters:  12/14/18 104/62  07/27/18 110/71  06/20/18 122/80    Chronic knee pain   All immunizations and health maintenance protocols were reviewed with the patient and needed orders were placed.  Appropriate screening laboratory values were ordered for the patient including screening of hyperlipidemia, renal function and hepatic function. If indicated by BPH, a PSA was ordered.  Medication reconciliation,  past medical history, social history, problem list and allergies were reviewed in detail with the patient  Goals were established with regard to weight loss, exercise, and  diet in compliance with medications    Review of Systems  Constitutional: Negative.   HENT: Negative.   Eyes: Negative.   Respiratory: Negative.   Cardiovascular: Negative.   Gastrointestinal: Negative.    Endocrine: Negative.   Genitourinary: Negative.   Musculoskeletal: Positive for arthralgias.  Skin: Negative.   Allergic/Immunologic: Negative.   Neurological: Negative.   Hematological: Negative.   Psychiatric/Behavioral: Negative.   All other systems reviewed and are negative.  Past Medical History:  Diagnosis Date  . Cardiomyopathy    Non-ischemic  . CHF (congestive heart failure) ([place])   . Diabetes mellitus without complication ([place])   . HTN (hypertension)   . Hyperlipidemia   . Hypokalemia   . Obesity   . OSA (obstructive sleep apnea)   . Sleep apnea    had surgery- no cpap  . Systolic CHF [facility])     Social History   Socioeconomic History  . Marital status: Married    Spouse name: Not on file  . Number of children: Not on file  . Years of education: Not on file  . Highest education level: Not on file  Occupational History    Employer: UNEMPLOYED  Social Needs  . Financial resource strain: Not on file  . Food insecurity    Worry: Not on file    Inability: Not on file  . Transportation needs    Medical: Not on file    Non-medical: Not on file  Tobacco Use  . Smoking status: Never Smoker  . Smokeless tobacco: Never Used  Substance and Sexual Activity  . Alcohol use: No  . Drug use: No  . Sexual activity: Not on file  Lifestyle  . Physical activity    Days per week: Not on file    Minutes per session: Not on file  . Stress: Not on file  Relationships  .  Social Musician on phone: Not on file    Gets together: Not on file    Attends religious service: Not on file    Active member of club or organization: Not on file    Attends meetings of clubs or organizations: Not on file    Relationship status: Not on file  . Intimate partner violence    Fear of current or ex partner: Not on file    Emotionally abused: Not on file    Physically abused: Not on file    Forced sexual activity: Not on file  Other Topics Concern  . Not on file  Social  History Narrative   Married 6 years   2 biologic daughters   2 stepsons, one Programme researcher, broadcasting/film/video for [employer]     Past Surgical History:  Procedure Laterality Date  . avulsion fracture of left hip    . CARDIAC CATHETERIZATION  10/08/08  . LEFT HEART CATH AND CORONARY ANGIOGRAPHY N/A 08/03/2016   Procedure: Left Heart Cath and Coronary Angiography;  Surgeon: Dolores Patty, MD;  Location: [facility] INVASIVE CV LAB;  Service: Cardiovascular;  Laterality: N/A;  . TONSILLECTOMY    . UVULOPALATOPHARYNGOPLASTY      Family History  Problem Relation Age of Onset  . Diabetes Mother   . Hypertension Mother   . Heart disease Mother   . Cancer Father        Throat cancer  . Stroke Father   . Stroke Brother 20  . Prostate cancer Neg Hx   . Colon cancer Neg Hx   . Colon polyps Neg Hx   . Esophageal cancer Neg Hx   . Rectal cancer Neg Hx   . Stomach cancer Neg Hx     Allergies  Allergen Reactions  . Other     Almonds-caused swelling in hands  . Shrimp [Shellfish Allergy] Swelling    Current Outpatient Medications on File Prior to Visit  Medication Sig Dispense Refill  . acetaminophen (TYLENOL) 500 MG tablet Take 500 mg by mouth every 6 (six) hours as needed for mild pain.    Marland Kitchen aspirin 81 MG tablet Take 81 mg by mouth daily.      Marland Kitchen atorvastatin (LIPITOR) 20 MG tablet Take 1 tablet (20 mg total) by mouth daily. (Patient not taking: Reported on 07/27/2018) 90 tablet 3  . Butenafine HCl 1 % cream Apply 1 application topically 2 (two) times daily. (Patient not taking: Reported on 07/27/2018) 30 g 1  . carvedilol (COREG) 25 MG tablet Take 1 tablet (25 mg total) by mouth 2 (two) times daily with a meal. 180 tablet 0  . Chlorpheniramine-DM (CORICIDIN HBP COUGH/COLD PO) Take 1 tablet by mouth every 12 (twelve) hours as needed (cold symptoms).    . diclofenac sodium (VOLTAREN) 1 % GEL Apply 4 g topically 4 (four) times daily. (Patient not taking: Reported on 07/27/2018) 100 g 5  . felodipine  (PLENDIL) 2.5 MG 24 hr tablet Take 1 tablet by mouth once daily 90 tablet 0  . furosemide (LASIX) 40 MG tablet TAKE 1 TABLET BY MOUTH ONCE DAILY (Patient taking differently: Take 40 mg by mouth daily. ) 90 tablet 3  . hydrALAZINE (APRESOLINE) 25 MG tablet TAKE 1 TABLET BY MOUTH THREE TIMES DAILY (Patient taking differently: Take 25 mg by mouth 3 (three) times daily. ) 90 tablet 3  . lisinopril (ZESTRIL) 40 MG tablet Take 1 tablet by mouth once daily 90 tablet 0  .  metFORMIN (GLUCOPHAGE) 500 MG tablet Take 1 tablet (500 mg total) by mouth 2 (two) times daily with a meal. 180 tablet 0  . potassium chloride (K-DUR,KLOR-CON) 10 MEQ tablet TAKE 1 TABLET BY MOUTH ONCE DAILY (Patient taking differently: Take 10 mEq by mouth daily. TAKE 1 TABLET BY MOUTH ONCE DAILY) 90 tablet 3  . spironolactone (ALDACTONE) 25 MG tablet Take 2 tablets (50 mg total) by mouth daily. 180 tablet 0  . tadalafil (CIALIS) 20 MG tablet Take 1 tablet (20 mg total) by mouth daily as needed for erectile dysfunction. 10 tablet 3   No current facility-administered medications on file prior to visit.     There were no vitals taken for this visit.      Objective:   Physical Exam Vitals signs and nursing note reviewed.  Constitutional:      Appearance: Normal appearance. He is obese.  HENT:     Head: Normocephalic and atraumatic.     Right Ear: Tympanic membrane, ear canal and external ear normal. There is no impacted cerumen.     Left Ear: Tympanic membrane, ear canal and external ear normal. There is no impacted cerumen.     Nose: Nose normal. No congestion or rhinorrhea.     Mouth/Throat:     Mouth: Mucous membranes are moist.     Pharynx: Oropharynx is clear.  Cardiovascular:     Rate and Rhythm: Normal rate and regular rhythm.     Pulses: Normal pulses.     Heart sounds: Normal heart sounds. No murmur. No friction rub. No gallop.   Pulmonary:     Effort: Pulmonary effort is normal. No respiratory distress.     Breath  sounds: Normal breath sounds. No stridor. No wheezing, rhonchi or rales.  Chest:     Chest wall: No tenderness.  Abdominal:     General: Abdomen is flat. Bowel sounds are normal. There is no distension.     Palpations: Abdomen is soft. There is no mass.     Tenderness: There is no abdominal tenderness. There is no right CVA tenderness, left CVA tenderness, guarding or rebound.     Hernia: No hernia is present.  Musculoskeletal: Normal range of motion.        General: No swelling, tenderness, deformity or signs of injury.     Right lower leg: No edema.     Left lower leg: No edema.  Skin:    General: Skin is warm and dry.     Capillary Refill: Capillary refill takes less than 2 seconds.     Coloration: Skin is not jaundiced or pale.     Findings: No bruising, erythema, lesion or rash.  Neurological:     General: No focal deficit present.     Mental Status: He is alert. Mental status is at baseline. He is disoriented.     Cranial Nerves: No cranial nerve deficit.     Sensory: No sensory deficit.     Motor: No weakness.     Coordination: Coordination normal.     Gait: Gait normal.     Deep Tendon Reflexes: Reflexes normal.  Psychiatric:        Mood and Affect: Mood normal.        Behavior: Behavior normal.        Thought Content: Thought content normal.        Judgment: Judgment normal.       Assessment & Plan:

## 2019-05-05 ENCOUNTER — Other Ambulatory Visit: Payer: Self-pay | Admitting: Adult Health

## 2019-05-05 DIAGNOSIS — Z760 Encounter for issue of repeat prescription: Secondary | ICD-10-CM

## 2019-05-08 ENCOUNTER — Other Ambulatory Visit: Payer: Self-pay | Admitting: Adult Health

## 2019-05-08 DIAGNOSIS — Z760 Encounter for issue of repeat prescription: Secondary | ICD-10-CM

## 2019-05-08 NOTE — Telephone Encounter (Signed)
Pt need all his med refill

## 2019-05-09 ENCOUNTER — Telehealth: Payer: Self-pay | Admitting: Adult Health

## 2019-05-09 ENCOUNTER — Other Ambulatory Visit: Payer: Self-pay | Admitting: Adult Health

## 2019-05-09 DIAGNOSIS — Z760 Encounter for issue of repeat prescription: Secondary | ICD-10-CM

## 2019-05-09 NOTE — Telephone Encounter (Signed)
Pt called back because the pharmacy informed him that they have not received the prescription for lisinopril, spironolactone and felodipine.

## 2019-05-09 NOTE — Telephone Encounter (Signed)
Medication refill: Lisinopril  Felodipine Potassium chloride Hydralazine Furosemide Carvedilol Metformin  Pt is out of these medication Pharmacy: [facility] [address]

## 2019-05-10 ENCOUNTER — Telehealth (INDEPENDENT_AMBULATORY_CARE_PROVIDER_SITE_OTHER): Payer: 59 | Admitting: Adult Health

## 2019-05-10 ENCOUNTER — Other Ambulatory Visit: Payer: Self-pay

## 2019-05-10 DIAGNOSIS — E782 Mixed hyperlipidemia: Secondary | ICD-10-CM

## 2019-05-10 DIAGNOSIS — I10 Essential (primary) hypertension: Secondary | ICD-10-CM | POA: Diagnosis not present

## 2019-05-10 DIAGNOSIS — Z760 Encounter for issue of repeat prescription: Secondary | ICD-10-CM | POA: Diagnosis not present

## 2019-05-10 DIAGNOSIS — I5022 Chronic systolic (congestive) heart failure: Secondary | ICD-10-CM

## 2019-05-10 DIAGNOSIS — E119 Type 2 diabetes mellitus without complications: Secondary | ICD-10-CM

## 2019-05-10 MED ORDER — FUROSEMIDE 40 MG PO TABS: 40.0000 mg | ORAL_TABLET | Freq: Every day | ORAL | 0 refills | Status: DC

## 2019-05-10 MED ORDER — HYDRALAZINE HCL 25 MG PO TABS: 25.0000 mg | ORAL_TABLET | Freq: Three times a day (TID) | ORAL | 0 refills | Status: DC

## 2019-05-10 MED ORDER — CARVEDILOL 25 MG PO TABS: 25.0000 mg | ORAL_TABLET | Freq: Two times a day (BID) | ORAL | 0 refills | Status: DC

## 2019-05-10 MED ORDER — ATORVASTATIN CALCIUM 20 MG PO TABS: 20.0000 mg | ORAL_TABLET | Freq: Every day | ORAL | 0 refills | Status: DC

## 2019-05-10 MED ORDER — METFORMIN HCL 500 MG PO TABS: 500.0000 mg | ORAL_TABLET | Freq: Two times a day (BID) | ORAL | 0 refills | Status: DC

## 2019-05-10 MED ORDER — TADALAFIL 20 MG PO TABS: 20.0000 mg | ORAL_TABLET | Freq: Every day | ORAL | 3 refills | Status: DC | PRN

## 2019-05-10 NOTE — Progress Notes (Signed)
Virtual Visit via Telephone Note  I connected with Caleb Garcia on 05/10/19 at  3:30 PM EST by telephone and verified that I am speaking with the correct person using two identifiers.   I discussed the limitations, risks, security and privacy concerns of performing an evaluation and management service by telephone and the availability of in person appointments. I also discussed with the patient that there may be a patient responsible charge related to this service. The patient expressed understanding and agreed to proceed.  Location patient: home Location provider: work or home office Participants present for the call: patient, provider Patient did not have a visit in the prior 7 days to address this/these issue(s).   History of Present Illness: 54 year old male who  has a past medical history of Cardiomyopathy, CHF (congestive heart failure) (HCC), Diabetes mellitus without complication (HCC), HTN (hypertension), Hyperlipidemia, Hypokalemia, Obesity, OSA (obstructive sleep apnea), Sleep apnea, and Systolic CHF (H[facility]).  He to [facility] 3 weeks ago for a new job.  His insurance for the new job will kick in in 1 month and then he will be able to find a primary care provider down there.  He is asking for 90-day refills on his current medication so he does not run out.   Observations/Objective: Patient sounds cheerful and well on the phone. I do not appreciate any SOB. Speech and thought processing are grossly intact. Patient reported vitals:  Assessment and Plan: 1. Medication refill  - atorvastatin (LIPITOR) 20 MG tablet; Take 1 tablet (20 mg total) by mouth daily.  Dispense: 90 tablet; Refill: 0 - carvedilol (COREG) 25 MG tablet; Take 1 tablet (25 mg total) by mouth 2 (two) times daily with a meal.  Dispense: 180 tablet; Refill: 0 - furosemide (LASIX) 40 MG tablet; Take 1 tablet (40 mg total) by mouth daily.  Dispense: 90 tablet; Refill: 0 - hydrALAZINE (APRESOLINE) 25  MG tablet; Take 1 tablet (25 mg total) by mouth 3 (three) times daily.  Dispense: 90 tablet; Refill: 0 - metFORMIN (GLUCOPHAGE) 500 MG tablet; Take 1 tablet (500 mg total) by mouth 2 (two) times daily with a meal.  Dispense: 180 tablet; Refill: 0 - tadalafil (CIALIS) 20 MG tablet; Take 1 tablet (20 mg total) by mouth daily as needed for erectile dysfunction.  Dispense: 10 tablet; Refill: 3  2. Chronic systolic heart failure (HCC)  - carvedilol (COREG) 25 MG tablet; Take 1 tablet (25 mg total) by mouth 2 (two) times daily with a meal.  Dispense: 180 tablet; Refill: 0 - furosemide (LASIX) 40 MG tablet; Take 1 tablet (40 mg total) by mouth daily.  Dispense: 90 tablet; Refill: 0 - hydrALAZINE (APRESOLINE) 25 MG tablet; Take 1 tablet (25 mg total) by mouth 3 (three) times daily.  Dispense: 90 tablet; Refill: 0  3. Controlled type 2 diabetes mellitus without complication, without long-term current use of insulin (HCC)  - metFORMIN (GLUCOPHAGE) 500 MG tablet; Take 1 tablet (500 mg total) by mouth 2 (two) times daily with a meal.  Dispense: 180 tablet; Refill: 0  4. Essential hypertension  - carvedilol (COREG) 25 MG tablet; Take 1 tablet (25 mg total) by mouth 2 (two) times daily with a meal.  Dispense: 180 tablet; Refill: 0 - furosemide (LASIX) 40 MG tablet; Take 1 tablet (40 mg total) by mouth daily.  Dispense: 90 tablet; Refill: 0 - hydrALAZINE (APRESOLINE) 25 MG tablet; Take 1 tablet (25 mg total) by mouth 3 (three) times daily.  Dispense: 90 tablet;  Refill: 0  5. Mixed hyperlipidemia  - atorvastatin (LIPITOR) 20 MG tablet; Take 1 tablet (20 mg total) by mouth daily.  Dispense: 90 tablet; Refill: 0   Follow Up Instructions:  I did not refer this patient for an OV in the next 24 hours for this/these issue(s).  I discussed the assessment and treatment plan with the patient. The patient was provided an opportunity to ask questions and all were answered. The patient agreed with the plan and  demonstrated an understanding of the instructions.   The patient was advised to call back or seek an in-person evaluation if the symptoms worsen or if the condition fails to improve as anticipated.  I provided 15 minutes of non-face-to-face time during this encounter.   Dorothyann Peng, NP

## 2019-05-17 ENCOUNTER — Encounter: Payer: 59 | Admitting: Adult Health

## 2019-06-26 ENCOUNTER — Telehealth: Payer: Self-pay | Admitting: Adult Health

## 2019-06-26 ENCOUNTER — Other Ambulatory Visit: Payer: Self-pay | Admitting: Adult Health

## 2019-06-26 DIAGNOSIS — Z760 Encounter for issue of repeat prescription: Secondary | ICD-10-CM

## 2019-06-26 MED ORDER — POTASSIUM CHLORIDE CRYS ER 10 MEQ PO TBCR: 10.0000 meq | EXTENDED_RELEASE_TABLET | Freq: Every day | ORAL | 0 refills | Status: DC

## 2019-06-26 NOTE — Telephone Encounter (Signed)
Ok for 30 days 

## 2019-06-26 NOTE — Telephone Encounter (Signed)
Sent to the pharmacy by e-scribe for 30 days as instructed. 

## 2019-06-26 NOTE — Telephone Encounter (Signed)
Pt has relocated to [facility] and is requesting refill on potassium chloride (K-DUR,KLOR-CON) 10 MEQ tablet [[id number]]   Pharmacy: [facility]  [address] [place], [place] [address]  [phone]

## 2019-06-27 ENCOUNTER — Telehealth: Payer: Self-pay | Admitting: Adult Health

## 2019-06-27 NOTE — Telephone Encounter (Signed)
MESSAGE SENT TO THE PHARMACY TO D/C MEDICATION.

## 2019-06-27 NOTE — Telephone Encounter (Signed)
Ok to cancel prescription

## 2019-06-27 NOTE — Telephone Encounter (Signed)
Pharmacist from [facility] pharmacy stated that the potassium and spironolactone may cause hyperkalemia, possible leading to cardia arthymia or cardiac arrest when taking both prescriptions at the same time. They are wanting to know if the PCP is aware of that?    They can be reached at [phone]

## 2019-06-27 NOTE — Telephone Encounter (Signed)
Spoke to the pharmacy and advised this medication to be discontinued.  Nothing further needed.

## 2019-08-23 ENCOUNTER — Telehealth: Payer: Self-pay | Admitting: Adult Health

## 2019-08-23 ENCOUNTER — Encounter: Payer: Self-pay | Admitting: Family Medicine

## 2019-08-23 NOTE — Telephone Encounter (Signed)
Letter faxed.  Received confirmation that the fax was successful.  Nothing further needed.

## 2019-08-23 NOTE — Telephone Encounter (Signed)
OK to write letter stating that his diabetes is controlled with Metformin 500 mg BID. His last A1c in December 2019 was 6. 2

## 2019-08-23 NOTE — Telephone Encounter (Signed)
Pt would like to know if his letter been fax  explaining his diabetes control. Pt is needing this letter for [facility] in [place] and so pt can return to work at [employer]. Please fax to [phone].  He also states that he is faxing a letter to corey about additional information that is needed will send the fax to misty  When it arrives

## 2019-08-23 NOTE — Telephone Encounter (Signed)
Pt is requesting a letter explaining his diabetes control. Pt is needing this letter for [facility] in [place] and so pt can return to work at [employer]. Please fax to [phone]. Thanks

## 2019-08-27 ENCOUNTER — Telehealth (HOSPITAL_COMMUNITY): Payer: Self-pay | Admitting: *Deleted

## 2019-08-27 NOTE — Telephone Encounter (Signed)
Pt called requesting we fax his last office note to [phone]. Note faxed pt aware.

## 2019-09-10 ENCOUNTER — Other Ambulatory Visit: Payer: Self-pay | Admitting: Adult Health

## 2019-09-10 DIAGNOSIS — Z760 Encounter for issue of repeat prescription: Secondary | ICD-10-CM

## 2020-01-30 ENCOUNTER — Other Ambulatory Visit: Payer: Self-pay | Admitting: Adult Health

## 2020-01-30 DIAGNOSIS — I10 Essential (primary) hypertension: Secondary | ICD-10-CM

## 2020-01-30 DIAGNOSIS — I5022 Chronic systolic (congestive) heart failure: Secondary | ICD-10-CM

## 2020-01-30 DIAGNOSIS — Z760 Encounter for issue of repeat prescription: Secondary | ICD-10-CM

## 2020-02-06 ENCOUNTER — Other Ambulatory Visit: Payer: Self-pay | Admitting: Adult Health

## 2020-02-06 DIAGNOSIS — E119 Type 2 diabetes mellitus without complications: Secondary | ICD-10-CM

## 2020-02-06 DIAGNOSIS — Z760 Encounter for issue of repeat prescription: Secondary | ICD-10-CM

## 2020-10-09 ENCOUNTER — Emergency Department (HOSPITAL_COMMUNITY)
Admission: EM | Admit: 2020-10-09 | Discharge: 2020-10-10 | Disposition: A | Discharge status: Home / Self Care | Payer: Self-pay | Attending: Emergency Medicine | Admitting: Emergency Medicine

## 2020-10-09 ENCOUNTER — Emergency Department (HOSPITAL_COMMUNITY): Payer: Self-pay

## 2020-10-09 ENCOUNTER — Other Ambulatory Visit: Payer: Self-pay

## 2020-10-09 DIAGNOSIS — Z7982 Long term (current) use of aspirin: Secondary | ICD-10-CM | POA: Insufficient documentation

## 2020-10-09 DIAGNOSIS — I110 Hypertensive heart disease with heart failure: Secondary | ICD-10-CM | POA: Insufficient documentation

## 2020-10-09 DIAGNOSIS — Z760 Encounter for issue of repeat prescription: Secondary | ICD-10-CM | POA: Insufficient documentation

## 2020-10-09 DIAGNOSIS — I10 Essential (primary) hypertension: Secondary | ICD-10-CM

## 2020-10-09 DIAGNOSIS — E119 Type 2 diabetes mellitus without complications: Secondary | ICD-10-CM | POA: Insufficient documentation

## 2020-10-09 DIAGNOSIS — I5022 Chronic systolic (congestive) heart failure: Secondary | ICD-10-CM | POA: Insufficient documentation

## 2020-10-09 DIAGNOSIS — Z79899 Other long term (current) drug therapy: Secondary | ICD-10-CM | POA: Insufficient documentation

## 2020-10-09 DIAGNOSIS — Z7984 Long term (current) use of oral hypoglycemic drugs: Secondary | ICD-10-CM | POA: Insufficient documentation

## 2020-10-09 LAB — BASIC METABOLIC PANEL
Anion gap: 9 (ref 5–15)
BUN: 12 mg/dL (ref 6–20)
CO2: 21 mmol/L — ABNORMAL LOW (ref 22–32)
Calcium: 8.8 mg/dL — ABNORMAL LOW (ref 8.9–10.3)
Chloride: 102 mmol/L (ref 98–111)
Creatinine, Ser: 0.98 mg/dL (ref 0.61–1.24)
GFR, Estimated: >60 mL/min (ref >60)
Glucose, Bld: 170 mg/dL — ABNORMAL HIGH (ref 70–99)
Potassium: 3.7 mmol/L (ref 3.5–5.1)
Sodium: 132 mmol/L — ABNORMAL LOW (ref 135–145)

## 2020-10-09 LAB — CBC
HCT: 40.3 % (ref 39.0–52.0)
Hemoglobin: 13.7 g/dL (ref 13.0–17.0)
MCH: 31.2 pg (ref 26.0–34.0)
MCHC: 34 g/dL (ref 30.0–36.0)
MCV: 91.8 fL (ref 80.0–100.0)
Platelets: 200 10*3/uL (ref 150–400)
RBC: 4.39 MIL/uL (ref 4.22–5.81)
RDW: 13.3 % (ref 11.5–15.5)
WBC: 6 10*3/uL (ref 4.0–10.5)
nRBC: 0 % (ref 0.0–0.2)

## 2020-10-09 LAB — TROPONIN I (HIGH SENSITIVITY)
Troponin I (High Sensitivity): 10 ng/L (ref <18)
Troponin I (High Sensitivity): 12 ng/L (ref <18)

## 2020-10-09 LAB — BRAIN NATRIURETIC PEPTIDE: B Natriuretic Peptide: 43.9 pg/mL (ref 0.0–100.0)

## 2020-10-09 MED ORDER — ACETAMINOPHEN 325 MG PO TABS
650.0000 mg | ORAL_TABLET | Freq: Once | ORAL | Status: AC
  Administered 2020-10-09: 650 mg via ORAL
  Filled 2020-10-09: qty 2

## 2020-10-09 NOTE — ED Triage Notes (Signed)
Patient states at work today around noon having chest pain to left side. States it feels like a knot running down his side. Denies any shortness of breath. Reports headache to left side making him feel woozy. States recently moved here from [facility] and has not had any of his BP or CHF meds.

## 2020-10-09 NOTE — ED Provider Notes (Signed)
Emergency Medicine Provider Triage Evaluation Note  Caleb Garcia , a 55 y.o. male  was evaluated in triage.  Pt complains of left-sided chest pain for the past few hours.  Denies shortness of breath.  Reports feeling woozy and having a headache.  Has not taken his blood pressure medications in several days.  No shortness of breath.  Review of Systems  Positive: Chest pain, headache Negative: Vision changes  Physical Exam  BP (!) 160/110   Pulse 92   Temp 98.6 F (37 C) (Oral)   Resp 12   Ht 5\' 9"  (1.753 m)   Wt 115.2 kg   SpO2 99%   BMI 37.51 kg/m  Gen:   Awake, no distress   Resp:  Normal effort  MSK:   Moves extremities without difficulty  Other:  No facial asymmetry.  Abdomen is soft.  No lower extremity edema  Medical Decision Making  Medically screening exam initiated at 6:46 PM.  Appropriate orders placed.  Caleb Garcia was informed that the remainder of the evaluation will be completed by another provider, this initial triage assessment does not replace that evaluation, and the importance of remaining in the ED until their evaluation is complete.  Lab work and imaging ordered   Caleb Ano, PA-C 10/09/20 1847    12/10/20, MD 10/09/20 [phone]

## 2020-10-10 MED ORDER — LISINOPRIL 40 MG PO TABS: 40.0000 mg | ORAL_TABLET | Freq: Every day | ORAL | 0 refills | Status: DC

## 2020-10-10 MED ORDER — METFORMIN HCL 500 MG PO TABS: 500.0000 mg | ORAL_TABLET | Freq: Two times a day (BID) | ORAL | 1 refills | Status: DC

## 2020-10-10 MED ORDER — CARVEDILOL 25 MG PO TABS: 25.0000 mg | ORAL_TABLET | Freq: Two times a day (BID) | ORAL | 0 refills | Status: DC

## 2020-10-10 MED ORDER — FUROSEMIDE 40 MG PO TABS: 40.0000 mg | ORAL_TABLET | Freq: Every day | ORAL | 0 refills | Status: DC

## 2020-10-13 NOTE — ED Provider Notes (Signed)
[facility] EMERGENCY DEPARTMENT Provider Note   CSN: [id number] Arrival date & time: 10/09/20  1813     History Chief Complaint  Patient presents with   Chest Pain    Caleb Garcia is a 55 y.o. male.   Chest Pain Pain location:  Substernal area Pain quality: aching   Pain radiates to:  Does not radiate Pain severity:  Mild Onset quality:  Gradual Duration:  3 hours Timing:  Constant Progression:  Worsening Chronicity:  Recurrent Relieved by:  None tried Worsened by:  Nothing Ineffective treatments:  None tried     Past Medical History:  Diagnosis Date   Cardiomyopathy    Non-ischemic   CHF (congestive heart failure) (HCC)    Diabetes mellitus without complication (HCC)    HTN (hypertension)    Hyperlipidemia    Hypokalemia    Obesity    OSA (obstructive sleep apnea)    Sleep apnea    had surgery- no cpap   Systolic CHF [facility])     Patient Active Problem List   Diagnosis Date Noted   Chest discomfort 07/28/2016   Controlled type 2 diabetes mellitus without complication (HCC) 11/26/2013   Bilateral shoulder pain 10/31/2013   Erectile dysfunction 04/18/2013   Mixed hyperlipidemia 11/26/2009   LIMB PAIN 07/23/2009   GERD 04/29/2009   Obesity 10/22/2008   OBSTRUCTIVE SLEEP APNEA 10/22/2008   Essential hypertension 10/22/2008   CARDIOMYOPATHY 10/22/2008   CONGESTIVE HEART FAILURE, MILD 10/22/2008   Chronic systolic heart failure (HCC) 10/16/2008    Past Surgical History:  Procedure Laterality Date   avulsion fracture of left hip     CARDIAC CATHETERIZATION  10/08/08   LEFT HEART CATH AND CORONARY ANGIOGRAPHY N/A 08/03/2016   Procedure: Left Heart Cath and Coronary Angiography;  Surgeon: Dolores Patty, MD;  Location: [facility] INVASIVE CV LAB;  Service: Cardiovascular;  Laterality: N/A;   TONSILLECTOMY     UVULOPALATOPHARYNGOPLASTY         Family History  Problem Relation Age of Onset   Diabetes Mother    Hypertension Mother     Heart disease Mother    Cancer Father        Throat cancer   Stroke Father    Stroke Brother 52   Prostate cancer Neg Hx    Colon cancer Neg Hx    Colon polyps Neg Hx    Esophageal cancer Neg Hx    Rectal cancer Neg Hx    Stomach cancer Neg Hx     Social History   Tobacco Use   Smoking status: Never   Smokeless tobacco: Never  Substance Use Topics   Alcohol use: No   Drug use: No    Home Medications Prior to Admission medications   Medication Sig Start Date End Date Taking? Authorizing Provider  acetaminophen (TYLENOL) 500 MG tablet Take 500 mg by mouth every 6 (six) hours as needed for mild pain.    [provider]  aspirin 81 MG tablet Take 81 mg by mouth daily.      [provider]  atorvastatin (LIPITOR) 20 MG tablet Take 1 tablet (20 mg total) by mouth daily. 05/10/19   Nafziger, Kandee Keen, NP  carvedilol (COREG) 25 MG tablet Take 1 tablet (25 mg total) by mouth 2 (two) times daily with a meal. 10/10/20   Masiah Lewing, Barbara Cower, MD  Chlorpheniramine-DM (CORICIDIN HBP COUGH/COLD PO) Take 1 tablet by mouth every 12 (twelve) hours as needed (cold symptoms).    [provider]  felodipine (PLENDIL) 2.5 MG 24 hr tablet Take 1 tablet by mouth once daily 05/09/19   Nafziger, Kandee Keen, NP  furosemide (LASIX) 40 MG tablet Take 1 tablet (40 mg total) by mouth daily. 10/10/20   Jeanie Mccard, Barbara Cower, MD  hydrALAZINE (APRESOLINE) 25 MG tablet Take 1 tablet (25 mg total) by mouth 3 (three) times daily. 05/10/19   Nafziger, Kandee Keen, NP  lisinopril (ZESTRIL) 40 MG tablet Take 1 tablet (40 mg total) by mouth daily. 10/10/20   Zawadi Aplin, Barbara Cower, MD  metFORMIN (GLUCOPHAGE) 500 MG tablet Take 1 tablet (500 mg total) by mouth 2 (two) times daily with a meal. 10/10/20   Lizzete Gough, Barbara Cower, MD  potassium chloride (KLOR-CON) 10 MEQ tablet Take 1 tablet (10 mEq total) by mouth daily. 06/26/19   Nafziger, Kandee Keen, NP  spironolactone (ALDACTONE) 25 MG tablet Take 2 tablets by mouth once daily 05/09/19   Nafziger, Kandee Keen, NP   tadalafil (CIALIS) 20 MG tablet Take 1 tablet (20 mg total) by mouth daily as needed for erectile dysfunction. 05/10/19   Shirline Frees, NP    Allergies    Other and Shrimp [shellfish allergy]  Review of Systems   Review of Systems  Cardiovascular:  Positive for chest pain.  All other systems reviewed and are negative.  Physical Exam Updated Vital Signs BP (!) 147/99   Pulse 76   Temp 97.7 F (36.5 C) (Oral)   Resp 19   Ht 5\' 9"  (1.753 m)   Wt 115.2 kg   SpO2 100%   BMI 37.51 kg/m   Physical Exam Vitals and nursing note reviewed.  Constitutional:      Appearance: He is well-developed.  HENT:     Head: Normocephalic and atraumatic.  Cardiovascular:     Rate and Rhythm: Normal rate.  Pulmonary:     Effort: Pulmonary effort is normal. No respiratory distress.  Abdominal:     General: There is no distension or abdominal bruit.     Palpations: Abdomen is soft. There is no fluid wave.  Musculoskeletal:        General: Normal range of motion.     Cervical back: Normal range of motion.  Skin:    General: Skin is warm and dry.  Neurological:     Mental Status: He is alert.    ED Results / Procedures / Treatments   Labs (all labs ordered are listed, but only abnormal results are displayed) Labs Reviewed  BASIC METABOLIC PANEL - Abnormal; Notable for the following components:      Result Value   Sodium 132 (*)    CO2 21 (*)    Glucose, Bld 170 (*)    Calcium 8.8 (*)    All other components within normal limits  CBC  BRAIN NATRIURETIC PEPTIDE  TROPONIN I (HIGH SENSITIVITY)  TROPONIN I (HIGH SENSITIVITY)    EKG EKG Interpretation  Date/Time:  Thursday October 09 2020 18:25:49 EDT Ventricular Rate:  91 PR Interval:  190 QRS Duration: 98 QT Interval:  376 QTC Calculation: 462 R Axis:   -9 Text Interpretation: Normal sinus rhythm T wave abnormality, consider inferolateral ischemia Prolonged QT Abnormal ECG inferior and lateral TWI are unchanged from 2020  Confirmed by 07-18-1999 [phone]) on 10/10/2020 1:42:20 AM  Radiology No results found.  Procedures Procedures   Medications Ordered in ED Medications  acetaminophen (TYLENOL) tablet 650 mg (650 mg Oral Given 10/09/20 2109)    ED Course  I have reviewed the triage vital signs and the nursing notes.  Pertinent labs & imaging results that were available during my care of the patient were reviewed by me and considered in my medical decision making (see chart for details).    MDM Rules/Calculators/A&P                          I think his symptoms are from not having his medications.  He also thinks the same.  I do not think he is having any acute coronary event or any other acute emergency requiring further work-up or hospitalization at this time.  I feel the patient stable for discharge.  Prescriptions provided.  Final Clinical Impression(s) / ED Diagnoses Final diagnoses:  Medication refill  Controlled type 2 diabetes mellitus without complication, without long-term current use of insulin (HCC)  Chronic systolic heart failure (HCC)  Essential hypertension    Rx / DC Orders ED Discharge Orders          Ordered    lisinopril (ZESTRIL) 40 MG tablet  Daily        10/10/20 0323    metFORMIN (GLUCOPHAGE) 500 MG tablet  2 times daily with meals        10/10/20 0323    furosemide (LASIX) 40 MG tablet  Daily        10/10/20 0323    carvedilol (COREG) 25 MG tablet  2 times daily with meals        10/10/20 0323             Bassem Bernasconi, Barbara Cower, MD 10/13/20 [phone]

## 2020-10-14 IMAGING — DX LEFT KNEE - 1-2 VIEW
2 series · 2 of 2 positions shown · non-contrast
Comparison: None.

CLINICAL DATA: 52-year-old male with bilateral knee pain. No known
injury. Initial encounter.

EXAM:
LEFT KNEE - 1-2 VIEW

[knee ap]
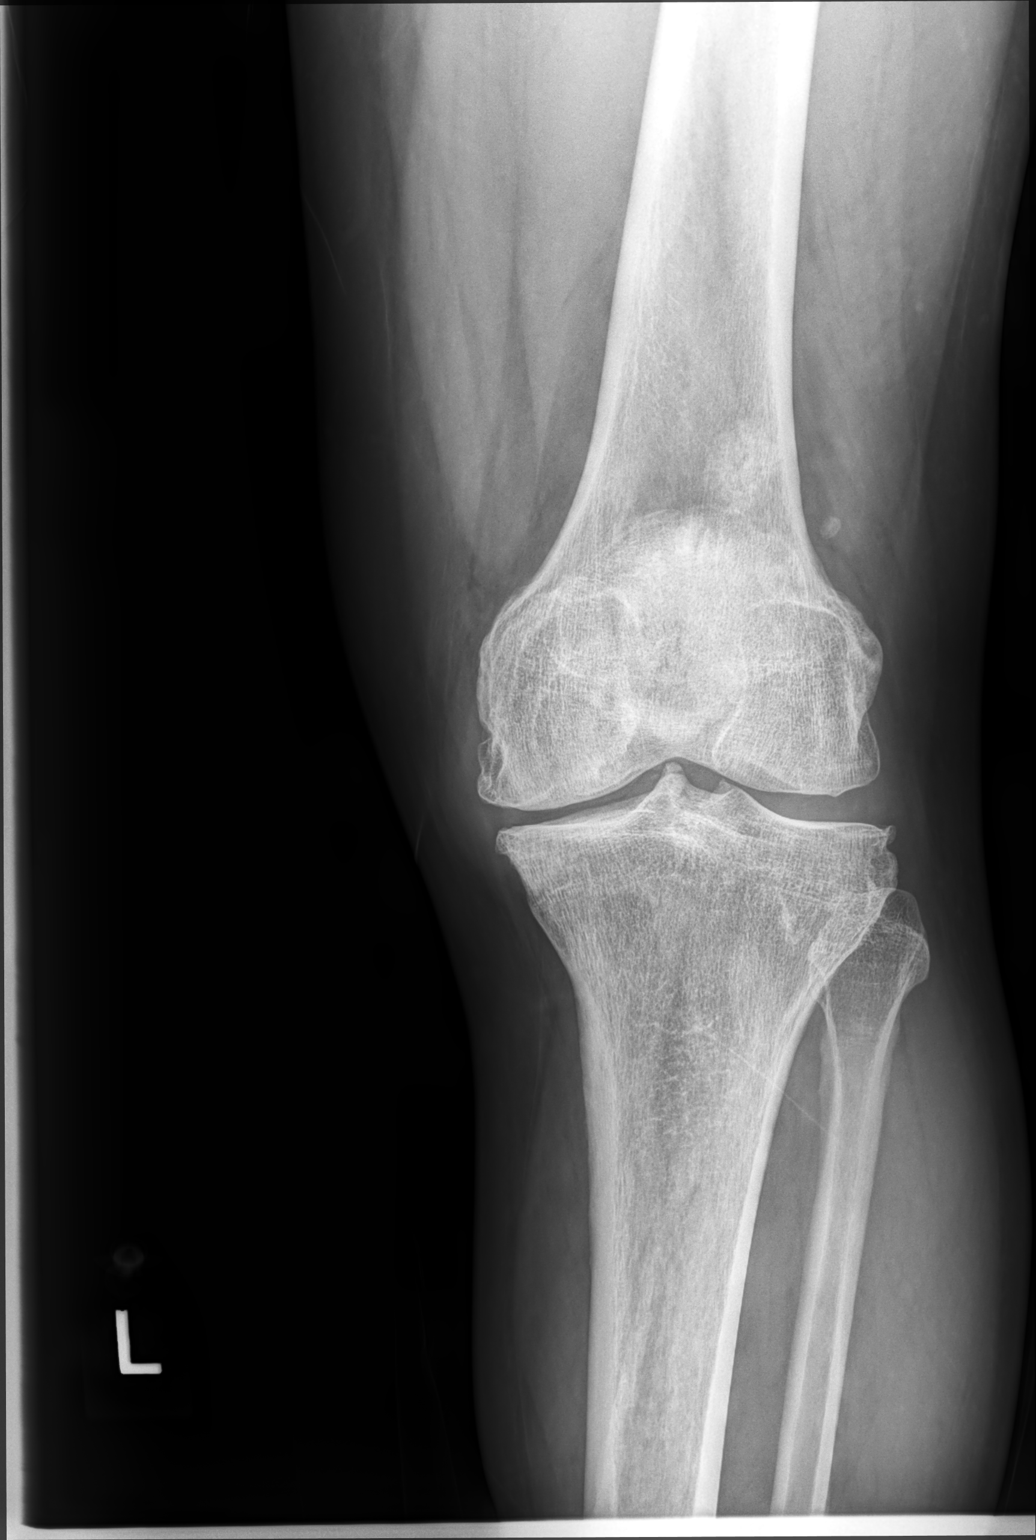

[knee lat]
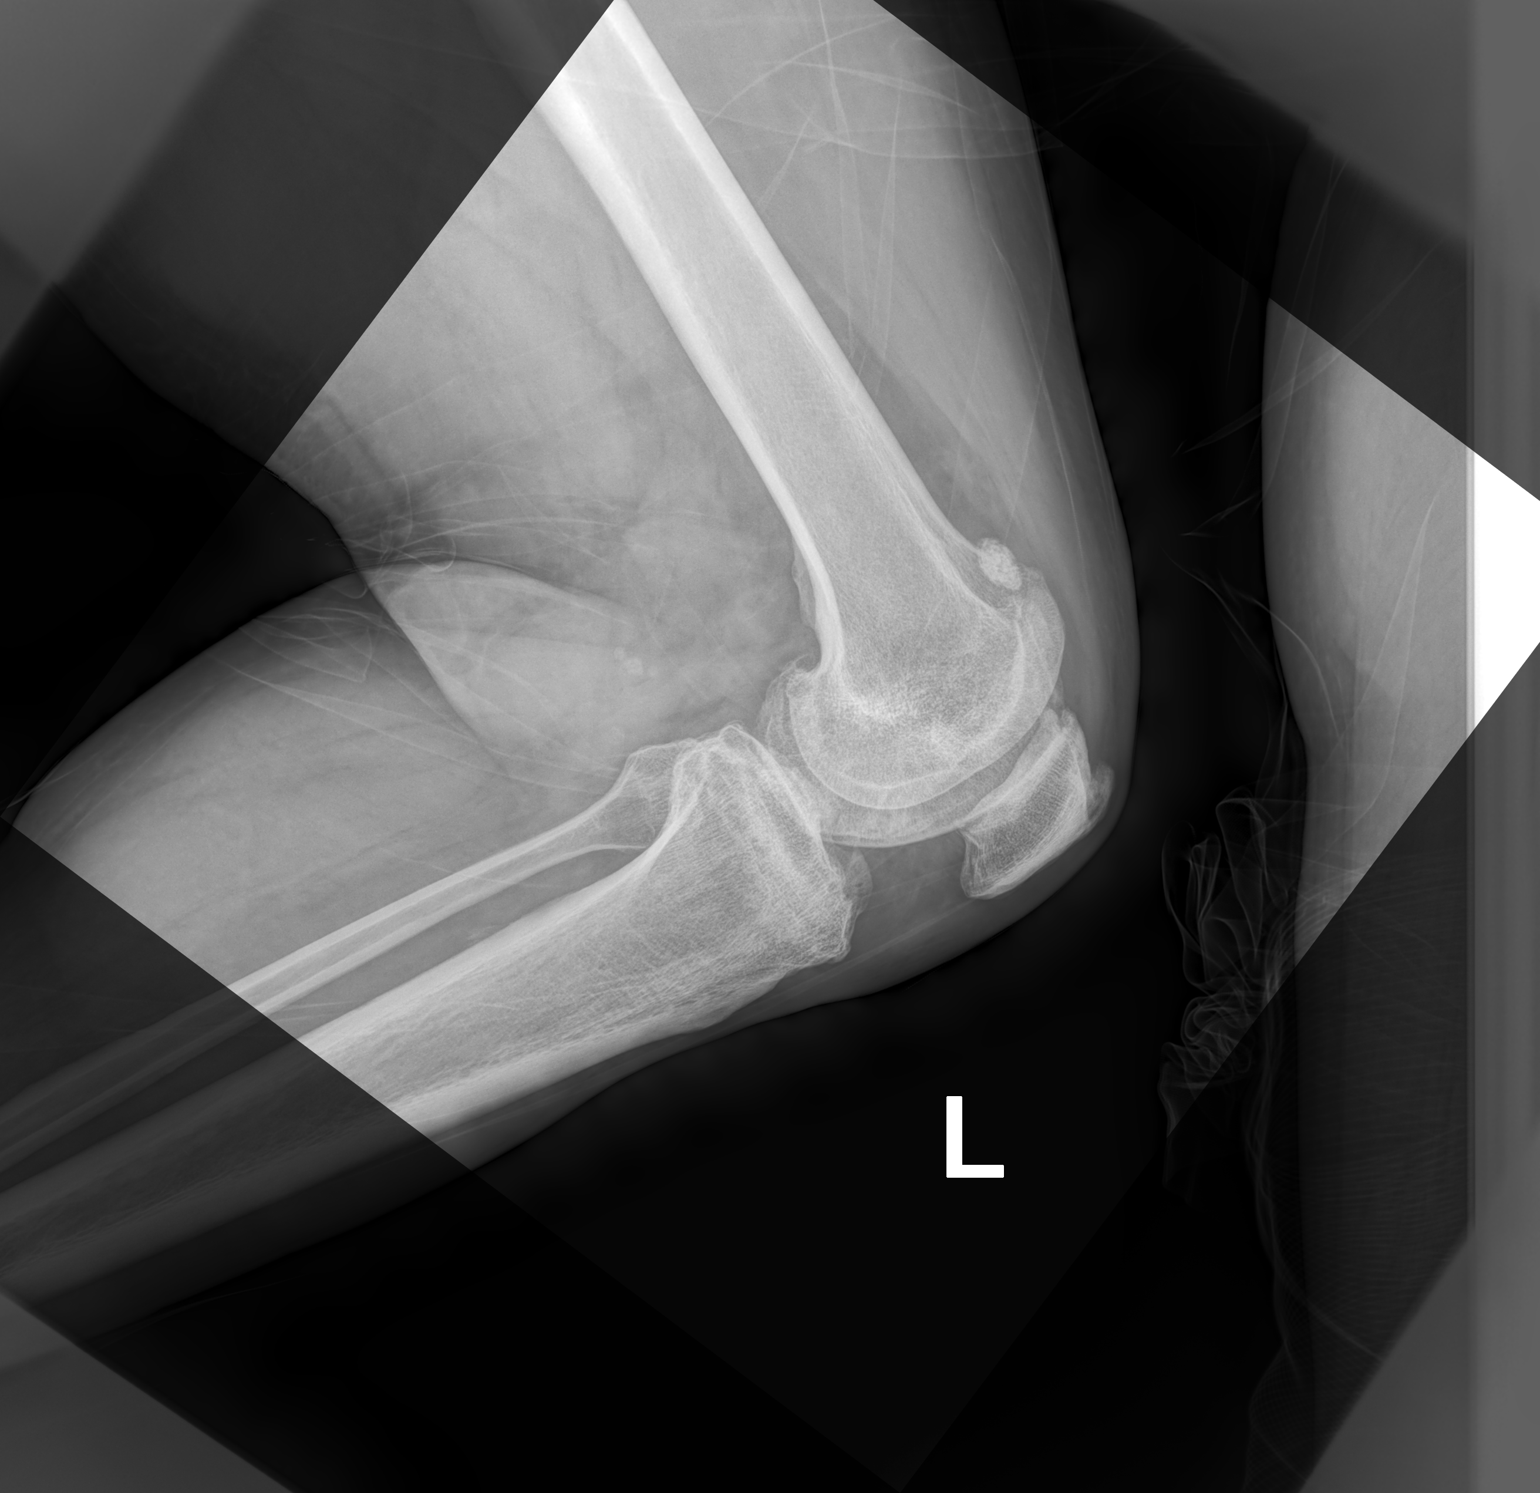

[2 of 2 positions shown; findings below may reference images not displayed]

FINDINGS: No fracture or dislocation.

Moderate medial tibiofemoral joint degenerative changes and joint
space narrowing. Mild to moderate patellofemoral joint degenerative
changes. Mild lateral tibiofemoral joint degenerative changes.

Small spur anterior superior aspect of the patella.

Suspect loose bodies within the suprapatellar region.
IMPRESSION: 1. Tricompartment degenerative changes as detailed above.
2. Suspect loose bodies within the suprapatellar region.

## 2020-10-28 ENCOUNTER — Ambulatory Visit: Payer: BC Managed Care – PPO | Admitting: Adult Health

## 2020-10-28 ENCOUNTER — Encounter: Payer: Self-pay | Admitting: Adult Health

## 2020-10-28 ENCOUNTER — Other Ambulatory Visit: Payer: Self-pay

## 2020-10-28 VITALS — BP 112/82 | HR 95 | Temp 98.5°F | Ht 69.0 in | Wt 245.0 lb

## 2020-10-28 DIAGNOSIS — Z1211 Encounter for screening for malignant neoplasm of colon: Secondary | ICD-10-CM

## 2020-10-28 DIAGNOSIS — E119 Type 2 diabetes mellitus without complications: Secondary | ICD-10-CM

## 2020-10-28 DIAGNOSIS — M25561 Pain in right knee: Secondary | ICD-10-CM

## 2020-10-28 DIAGNOSIS — I5022 Chronic systolic (congestive) heart failure: Secondary | ICD-10-CM | POA: Diagnosis not present

## 2020-10-28 DIAGNOSIS — E782 Mixed hyperlipidemia: Secondary | ICD-10-CM

## 2020-10-28 DIAGNOSIS — G8929 Other chronic pain: Secondary | ICD-10-CM

## 2020-10-28 DIAGNOSIS — Z760 Encounter for issue of repeat prescription: Secondary | ICD-10-CM

## 2020-10-28 DIAGNOSIS — M25562 Pain in left knee: Secondary | ICD-10-CM

## 2020-10-28 DIAGNOSIS — I10 Essential (primary) hypertension: Secondary | ICD-10-CM

## 2020-10-28 MED ORDER — ATORVASTATIN CALCIUM 20 MG PO TABS: 20.0000 mg | ORAL_TABLET | Freq: Every day | ORAL | 0 refills | Status: DC

## 2020-10-28 MED ORDER — POTASSIUM CHLORIDE CRYS ER 10 MEQ PO TBCR: 10.0000 meq | EXTENDED_RELEASE_TABLET | Freq: Every day | ORAL | 0 refills | Status: DC

## 2020-10-28 MED ORDER — HYDRALAZINE HCL 25 MG PO TABS: 25.0000 mg | ORAL_TABLET | Freq: Three times a day (TID) | ORAL | 0 refills | Status: DC

## 2020-10-28 MED ORDER — METFORMIN HCL 500 MG PO TABS: 500.0000 mg | ORAL_TABLET | Freq: Two times a day (BID) | ORAL | 0 refills | Status: DC

## 2020-10-28 MED ORDER — LISINOPRIL 40 MG PO TABS: 40.0000 mg | ORAL_TABLET | Freq: Every day | ORAL | 0 refills | Status: DC

## 2020-10-28 MED ORDER — FELODIPINE ER 2.5 MG PO TB24: 2.5000 mg | ORAL_TABLET | Freq: Every day | ORAL | 0 refills | Status: DC

## 2020-10-28 MED ORDER — TRAMADOL HCL 50 MG PO TABS: 50.0000 mg | ORAL_TABLET | Freq: Three times a day (TID) | ORAL | 0 refills | Status: AC

## 2020-10-28 NOTE — Patient Instructions (Addendum)
It was great seeing you today and welcome back   I am going to refill your medications for 90 days - I want to see you back for your physical in that time  I am also going to refer you to [facility] for your knee pain and refer you for your colonoscopy

## 2020-10-28 NOTE — Progress Notes (Signed)
Subjective:    Patient ID: Caleb Garcia, male    DOB: [birth year], 55 y.o.   MRN: [id number]  HPI  55 year old male who  has a past medical history of Cardiomyopathy, CHF (congestive heart failure) (HCC), Diabetes mellitus without complication (HCC), HTN (hypertension), Hyperlipidemia, Hypokalemia, Obesity, OSA (obstructive sleep apnea), Sleep apnea, and Systolic CHF (HCC).  Has returned to [place] from [place] where he had moved to for work.  Has been out of medication for quite some time was seen at [facility] emergency room recently for chest pain thought his symptoms were from not having his medication.  He was given a short course of lisinopril, metformin, Lasix, and Coreg.  He is here today to get refills on the rest of his medications.  Would also like to get scheduled for a colonoscopy.  His biggest complaint is that of bilateral chronic knee pain.  This has been a longtime issue, has been seen by orthopedics in the past and received steroid and gel shots which did not seem to work.  He is interested in seeing another orthopedic to see if he needs to have surgery  BP Readings from Last 3 Encounters:  10/28/20 112/82  10/10/20 (!) 147/99  12/14/18 104/62    Review of Systems See HPI   Past Medical History:  Diagnosis Date   Cardiomyopathy    Non-ischemic   CHF (congestive heart failure) (HCC)    Diabetes mellitus without complication (HCC)    HTN (hypertension)    Hyperlipidemia    Hypokalemia    Obesity    OSA (obstructive sleep apnea)    Sleep apnea    had surgery- no cpap   Systolic CHF (HCC)     Social History   Socioeconomic History   Marital status: Married    Spouse name: Not on file   Number of children: Not on file   Years of education: Not on file   Highest education level: Not on file  Occupational History    Employer: UNEMPLOYED  Tobacco Use   Smoking status: Never   Smokeless tobacco: Never  Substance and Sexual Activity   Alcohol use:  No   Drug use: No   Sexual activity: Not on file  Other Topics Concern   Not on file  Social History Narrative   Married 6 years   2 biologic daughters   2 stepsons, one Programme researcher, broadcasting/film/video for [employer]    Social Determinants of Health   Financial Resource Strain: Not on file  Food Insecurity: Not on file  Transportation Needs: Not on file  Physical Activity: Not on file  Stress: Not on file  Social Connections: Not on file  Intimate Partner Violence: Not on file    Past Surgical History:  Procedure Laterality Date   avulsion fracture of left hip     CARDIAC CATHETERIZATION  10/08/08   LEFT HEART CATH AND CORONARY ANGIOGRAPHY N/A 08/03/2016   Procedure: Left Heart Cath and Coronary Angiography;  Surgeon: Dolores Patty, MD;  Location: [facility] INVASIVE CV LAB;  Service: Cardiovascular;  Laterality: N/A;   TONSILLECTOMY     UVULOPALATOPHARYNGOPLASTY      Family History  Problem Relation Age of Onset   Diabetes Mother    Hypertension Mother    Heart disease Mother    Cancer Father        Throat cancer   Stroke Father    Stroke Brother 78   Prostate cancer Neg Hx  Colon cancer Neg Hx    Colon polyps Neg Hx    Esophageal cancer Neg Hx    Rectal cancer Neg Hx    Stomach cancer Neg Hx     Allergies  Allergen Reactions   Other     Almonds-caused swelling in hands   Shrimp [Shellfish Allergy] Swelling    Current Outpatient Medications on File Prior to Visit  Medication Sig Dispense Refill   acetaminophen (TYLENOL) 500 MG tablet Take 500 mg by mouth every 6 (six) hours as needed for mild pain.     aspirin 81 MG tablet Take 81 mg by mouth daily.       carvedilol (COREG) 25 MG tablet Take 1 tablet (25 mg total) by mouth 2 (two) times daily with a meal. 180 tablet 0   furosemide (LASIX) 40 MG tablet Take 1 tablet (40 mg total) by mouth daily. 90 tablet 0   spironolactone (ALDACTONE) 25 MG tablet Take 2 tablets by mouth once daily 180 tablet 0   tadalafil (CIALIS) 20  MG tablet Take 1 tablet (20 mg total) by mouth daily as needed for erectile dysfunction. 10 tablet 3   Chlorpheniramine-DM (CORICIDIN HBP COUGH/COLD PO) Take 1 tablet by mouth every 12 (twelve) hours as needed (cold symptoms). (Patient not taking: Reported on 10/28/2020)     No current facility-administered medications on file prior to visit.    BP 112/82   Pulse 95   Temp 98.5 F (36.9 C) (Oral)   Ht 5\' 9"  (1.753 m)   Wt 245 lb (111.1 kg)   SpO2 97%   BMI 36.18 kg/m       Objective:   Physical Exam Vitals and nursing note reviewed.  Constitutional:      Appearance: Normal appearance.  Cardiovascular:     Rate and Rhythm: Normal rate and regular rhythm.     Pulses: Normal pulses.     Heart sounds: Normal heart sounds.  Pulmonary:     Effort: Pulmonary effort is normal.     Breath sounds: Normal breath sounds.  Musculoskeletal:        General: Tenderness present. No deformity. Normal range of motion.     Right lower leg: No edema.     Left lower leg: No edema.  Skin:    General: Skin is warm and dry.  Neurological:     General: No focal deficit present.     Mental Status: He is alert and oriented to person, place, and time.  Psychiatric:        Mood and Affect: Mood normal.        Behavior: Behavior normal.        Thought Content: Thought content normal.          Assessment & Plan:   1. Controlled type 2 diabetes mellitus without complication, without long-term current use of insulin (HCC) - Will get him restarted on medications. Needs to follow up in 90 days for CPE  - metFORMIN (GLUCOPHAGE) 500 MG tablet; Take 1 tablet (500 mg total) by mouth 2 (two) times daily with a meal.  Dispense: 180 tablet; Refill: 0  2. Chronic systolic heart failure (HCC)  - hydrALAZINE (APRESOLINE) 25 MG tablet; Take 1 tablet (25 mg total) by mouth 3 (three) times daily.  Dispense: 270 tablet; Refill: 0  3. Essential hypertension  - potassium chloride (KLOR-CON) 10 MEQ tablet;  Take 1 tablet (10 mEq total) by mouth daily.  Dispense: 90 tablet; Refill: 0 - hydrALAZINE (APRESOLINE) 25  MG tablet; Take 1 tablet (25 mg total) by mouth 3 (three) times daily.  Dispense: 270 tablet; Refill: 0 - felodipine (PLENDIL) 2.5 MG 24 hr tablet; Take 1 tablet (2.5 mg total) by mouth daily.  Dispense: 90 tablet; Refill: 0 - lisinopril (ZESTRIL) 40 MG tablet; Take 1 tablet (40 mg total) by mouth daily.  Dispense: 90 tablet; Refill: 0  4. Mixed hyperlipidemia  - atorvastatin (LIPITOR) 20 MG tablet; Take 1 tablet (20 mg total) by mouth daily.  Dispense: 90 tablet; Refill: 0  5. Bilateral chronic knee pain  - DG Knee 1-2 Views Right; Future - DG Knee 1-2 Views Left; Future - traMADol (ULTRAM) 50 MG tablet; Take 1 tablet (50 mg total) by mouth every 8 (eight) hours for 7 days.  Dispense: 21 tablet; Refill: 0 - AMB referral to orthopedics  6. Colon cancer screening  - Ambulatory referral to Gastroenterology  Shirline Frees, NP

## 2020-11-24 ENCOUNTER — Other Ambulatory Visit: Payer: Self-pay

## 2020-11-24 ENCOUNTER — Ambulatory Visit (INDEPENDENT_AMBULATORY_CARE_PROVIDER_SITE_OTHER)
Admission: RE | Admit: 2020-11-24 | Discharge: 2020-11-24 | Disposition: A | Discharge status: Home / Self Care | Payer: BC Managed Care – PPO | Source: Ambulatory Visit | Attending: Adult Health | Admitting: Adult Health

## 2020-11-24 DIAGNOSIS — G8929 Other chronic pain: Secondary | ICD-10-CM | POA: Diagnosis not present

## 2020-11-24 DIAGNOSIS — M1711 Unilateral primary osteoarthritis, right knee: Secondary | ICD-10-CM | POA: Diagnosis not present

## 2020-11-24 DIAGNOSIS — M25561 Pain in right knee: Secondary | ICD-10-CM | POA: Diagnosis not present

## 2020-11-24 DIAGNOSIS — M25562 Pain in left knee: Secondary | ICD-10-CM

## 2020-11-26 DIAGNOSIS — M170 Bilateral primary osteoarthritis of knee: Secondary | ICD-10-CM | POA: Diagnosis not present

## 2020-12-02 ENCOUNTER — Ambulatory Visit: Payer: BC Managed Care – PPO | Admitting: Adult Health

## 2020-12-02 NOTE — Progress Notes (Deleted)
Subjective:    Patient ID: Caleb Garcia, male    DOB: November 29, 1965, 55 y.o.   MRN: [id number]  HPI 55 year old male who  has a past medical history of Cardiomyopathy, CHF (congestive heart failure) (HCC), Diabetes mellitus without complication (HCC), HTN (hypertension), Hyperlipidemia, Hypokalemia, Obesity, OSA (obstructive sleep apnea), Sleep apnea, and Systolic CHF (HCC).    Review of Systems See HPI   Past Medical History:  Diagnosis Date   Cardiomyopathy    Non-ischemic   CHF (congestive heart failure) (HCC)    Diabetes mellitus without complication (HCC)    HTN (hypertension)    Hyperlipidemia    Hypokalemia    Obesity    OSA (obstructive sleep apnea)    Sleep apnea    had surgery- no cpap   Systolic CHF (HCC)     Social History   Socioeconomic History   Marital status: Married    Spouse name: Not on file   Number of children: Not on file   Years of education: Not on file   Highest education level: Not on file  Occupational History    Employer: UNEMPLOYED  Tobacco Use   Smoking status: Never   Smokeless tobacco: Never  Substance and Sexual Activity   Alcohol use: No   Drug use: No   Sexual activity: Not on file  Other Topics Concern   Not on file  Social History Narrative   Married 6 years   2 biologic daughters   2 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, one Programme researcher, broadcasting/film/video for [employer]    Social Determinants of Health   Financial Resource Strain: Not on file  Food Insecurity: Not on file  Transportation Needs: Not on file  Physical Activity: Not on file  Stress: Not on file  Social Connections: Not on file  Intimate Partner Violence: Not on file    Past Surgical History:  Procedure Laterality Date   avulsion fracture of left hip     CARDIAC CATHETERIZATION  10/08/08   LEFT HEART CATH AND CORONARY ANGIOGRAPHY N/A 08/03/2016   Procedure: Left Heart Cath and Coronary Angiography;  Surgeon: Dolores Patty, MD;  Location: [facility] INVASIVE CV LAB;  Service:  Cardiovascular;  Laterality: N/A;   TONSILLECTOMY     UVULOPALATOPHARYNGOPLASTY      Family History  Problem Relation Age of Onset   Diabetes Mother    Hypertension Mother    Heart disease Mother    Cancer Father        Throat cancer   Stroke Father    Stroke Brother 27   Prostate cancer Neg Hx    Colon cancer Neg Hx    Colon polyps Neg Hx    Esophageal cancer Neg Hx    Rectal cancer Neg Hx    Stomach cancer Neg Hx     Allergies  Allergen Reactions   Other     Almonds-caused swelling in hands   Shrimp [Shellfish Allergy] Swelling    Current Outpatient Medications on File Prior to Visit  Medication Sig Dispense Refill   acetaminophen (TYLENOL) 500 MG tablet Take 500 mg by mouth every 6 (six) hours as needed for mild pain.     aspirin 81 MG tablet Take 81 mg by mouth daily.       atorvastatin (LIPITOR) 20 MG tablet Take 1 tablet (20 mg total) by mouth daily. 90 tablet 0   carvedilol (COREG) 25 MG tablet Take 1 tablet (25 mg total) by mouth 2 (two) times daily with  a meal. 180 tablet 0   Chlorpheniramine-DM (CORICIDIN HBP COUGH/COLD PO) Take 1 tablet by mouth every 12 (twelve) hours as needed (cold symptoms). (Patient not taking: Reported on 10/28/2020)     felodipine (PLENDIL) 2.5 MG 24 hr tablet Take 1 tablet (2.5 mg total) by mouth daily. 90 tablet 0   furosemide (LASIX) 40 MG tablet Take 1 tablet (40 mg total) by mouth daily. 90 tablet 0   hydrALAZINE (APRESOLINE) 25 MG tablet Take 1 tablet (25 mg total) by mouth 3 (three) times daily. 270 tablet 0   lisinopril (ZESTRIL) 40 MG tablet Take 1 tablet (40 mg total) by mouth daily. 90 tablet 0   metFORMIN (GLUCOPHAGE) 500 MG tablet Take 1 tablet (500 mg total) by mouth 2 (two) times daily with a meal. 180 tablet 0   potassium chloride (KLOR-CON) 10 MEQ tablet Take 1 tablet (10 mEq total) by mouth daily. 90 tablet 0   spironolactone (ALDACTONE) 25 MG tablet Take 2 tablets by mouth once daily 180 tablet 0   tadalafil (CIALIS) 20  MG tablet Take 1 tablet (20 mg total) by mouth daily as needed for erectile dysfunction. 10 tablet 3   No current facility-administered medications on file prior to visit.    There were no vitals taken for this visit.      Objective:   Physical Exam        Assessment & Plan:

## 2020-12-22 ENCOUNTER — Other Ambulatory Visit: Payer: Self-pay

## 2020-12-23 ENCOUNTER — Ambulatory Visit (INDEPENDENT_AMBULATORY_CARE_PROVIDER_SITE_OTHER): Payer: BC Managed Care – PPO | Admitting: Adult Health

## 2020-12-23 ENCOUNTER — Encounter: Payer: Self-pay | Admitting: Adult Health

## 2020-12-23 VITALS — BP 128/82 | HR 88 | Temp 98.8°F | Wt 244.0 lb

## 2020-12-23 DIAGNOSIS — I10 Essential (primary) hypertension: Secondary | ICD-10-CM | POA: Diagnosis not present

## 2020-12-23 DIAGNOSIS — E119 Type 2 diabetes mellitus without complications: Secondary | ICD-10-CM | POA: Diagnosis not present

## 2020-12-23 LAB — POCT GLYCOSYLATED HEMOGLOBIN (HGB A1C): Hemoglobin A1C: 7.3 % — AB (ref 4.0–5.6)

## 2020-12-23 NOTE — Patient Instructions (Addendum)
Your A1c was 7.3   I am going to keep you on your current medication dose. Please follow up in three months for your physical exam   Please call [facility] and let them know this number so that they can get you started on knee injections

## 2020-12-23 NOTE — Progress Notes (Signed)
Subjective:    Patient ID: Caleb Garcia, male    DOB: 1965-09-18, 55 y.o.   MRN: [id number]  HPI  55 year old male who  has a past medical history of Cardiomyopathy, CHF (congestive heart failure) (HCC), Diabetes mellitus without complication (HCC), HTN (hypertension), Hyperlipidemia, Hypokalemia, Obesity, OSA (obstructive sleep apnea), Sleep apnea, and Systolic CHF (HCC).  He presents to the office today for follow up regarding DM and HTN. He was last seen in July at which time he had been out of his medications for some time. We got him back on all of his medications and he is here to follow up to have A1c done.   He is also now going to [facility] for chronic bilateral knee pain. Supposed to start steroid injections but needs to know what his A1c is before orthopedics will do injections.   HTN - managed with hydralazine 25 mg TID, Coreg 25 mg BID, Aldactone 50 mg daily, and lisinopril 40 mg daily. He denies dizziness,lightheadedness, blurred vision, or chest pain   BP Readings from Last 3 Encounters:  12/23/20 128/82  10/28/20 112/82  10/10/20 (!) 147/99     Review of Systems See HPI   Past Medical History:  Diagnosis Date   Cardiomyopathy    Non-ischemic   CHF (congestive heart failure) (HCC)    Diabetes mellitus without complication (HCC)    HTN (hypertension)    Hyperlipidemia    Hypokalemia    Obesity    OSA (obstructive sleep apnea)    Sleep apnea    had surgery- no cpap   Systolic CHF (HCC)     Social History   Socioeconomic History   Marital status: Married    Spouse name: Not on file   Number of children: Not on file   Years of education: Not on file   Highest education level: Not on file  Occupational History    Employer: UNEMPLOYED  Tobacco Use   Smoking status: Never   Smokeless tobacco: Never  Substance and Sexual Activity   Alcohol use: No   Drug use: No   Sexual activity: Not on file  Other Topics Concern   Not on file  Social  History Narrative   Married 6 years   2 biologic daughters   2 stepsons, one Programme researcher, broadcasting/film/video for [employer]    Social Determinants of Health   Financial Resource Strain: Not on file  Food Insecurity: Not on file  Transportation Needs: Not on file  Physical Activity: Not on file  Stress: Not on file  Social Connections: Not on file  Intimate Partner Violence: Not on file    Past Surgical History:  Procedure Laterality Date   avulsion fracture of left hip     CARDIAC CATHETERIZATION  10/08/08   LEFT HEART CATH AND CORONARY ANGIOGRAPHY N/A 08/03/2016   Procedure: Left Heart Cath and Coronary Angiography;  Surgeon: Dolores Patty, MD;  Location: [facility] INVASIVE CV LAB;  Service: Cardiovascular;  Laterality: N/A;   TONSILLECTOMY     UVULOPALATOPHARYNGOPLASTY      Family History  Problem Relation Age of Onset   Diabetes Mother    Hypertension Mother    Heart disease Mother    Cancer Father        Throat cancer   Stroke Father    Stroke Brother 41   Prostate cancer Neg Hx    Colon cancer Neg Hx    Colon polyps Neg Hx  Esophageal cancer Neg Hx    Rectal cancer Neg Hx    Stomach cancer Neg Hx     Allergies  Allergen Reactions   Other     Almonds-caused swelling in hands   Shrimp [Shellfish Allergy] Swelling    Current Outpatient Medications on File Prior to Visit  Medication Sig Dispense Refill   acetaminophen (TYLENOL) 500 MG tablet Take 500 mg by mouth every 6 (six) hours as needed for mild pain.     aspirin 81 MG tablet Take 81 mg by mouth daily.       atorvastatin (LIPITOR) 20 MG tablet Take 1 tablet (20 mg total) by mouth daily. 90 tablet 0   carvedilol (COREG) 25 MG tablet Take 1 tablet (25 mg total) by mouth 2 (two) times daily with a meal. 180 tablet 0   Chlorpheniramine-DM (CORICIDIN HBP COUGH/COLD PO) Take 1 tablet by mouth every 12 (twelve) hours as needed (cold symptoms).     felodipine (PLENDIL) 2.5 MG 24 hr tablet Take 1 tablet (2.5 mg total) by mouth  daily. 90 tablet 0   furosemide (LASIX) 40 MG tablet Take 1 tablet (40 mg total) by mouth daily. 90 tablet 0   hydrALAZINE (APRESOLINE) 25 MG tablet Take 1 tablet (25 mg total) by mouth 3 (three) times daily. 270 tablet 0   lisinopril (ZESTRIL) 40 MG tablet Take 1 tablet (40 mg total) by mouth daily. 90 tablet 0   metFORMIN (GLUCOPHAGE) 500 MG tablet Take 1 tablet (500 mg total) by mouth 2 (two) times daily with a meal. 180 tablet 0   potassium chloride (KLOR-CON) 10 MEQ tablet Take 1 tablet (10 mEq total) by mouth daily. 90 tablet 0   spironolactone (ALDACTONE) 25 MG tablet Take 2 tablets by mouth once daily 180 tablet 0   tadalafil (CIALIS) 20 MG tablet Take 1 tablet (20 mg total) by mouth daily as needed for erectile dysfunction. 10 tablet 3   No current facility-administered medications on file prior to visit.    BP 128/82   Pulse 88   Temp 98.8 F (37.1 C)   Wt 244 lb (110.7 kg)   SpO2 97%   BMI 36.03 kg/m       Objective:   Physical Exam Vitals and nursing note reviewed.  Constitutional:      Appearance: Normal appearance.  Cardiovascular:     Rate and Rhythm: Normal rate and regular rhythm.     Pulses: Normal pulses.     Heart sounds: Normal heart sounds.  Pulmonary:     Effort: Pulmonary effort is normal.     Breath sounds: Normal breath sounds.  Musculoskeletal:        General: Normal range of motion.  Skin:    General: Skin is warm and dry.     Capillary Refill: Capillary refill takes less than 2 seconds.  Neurological:     General: No focal deficit present.     Mental Status: He is alert and oriented to person, place, and time.  Psychiatric:        Mood and Affect: Mood normal.        Behavior: Behavior normal.        Thought Content: Thought content normal.        Judgment: Judgment normal.      Assessment & Plan:  1. Controlled type 2 diabetes mellitus without complication, without long-term current use of insulin (HCC)  - POC HgB A1c- 7.3 - Continue  with current dose of Metformin  -  Follow up in three months for CPE   2. Essential hypertension - Well controlled.  - No change in medicaitons   Shirline Frees, NP

## 2021-01-12 ENCOUNTER — Telehealth: Payer: Self-pay | Admitting: Adult Health

## 2021-01-12 NOTE — Telephone Encounter (Signed)
Patient called because patient needs Caleb Garcia to send a confirmation over to [facility] saying it is okay to get knee injections with patient's A1C numbers. Patient is supposed to see Dr.Chabon      Fax number is [phone]    Please Advise

## 2021-01-13 NOTE — Telephone Encounter (Signed)
Please advise 

## 2021-01-14 NOTE — Telephone Encounter (Signed)
Noted  

## 2021-01-15 NOTE — Telephone Encounter (Signed)
Letter has been fax electronically through [employer]. Will f/u with [facility] to make sure this was sent.

## 2021-01-20 ENCOUNTER — Ambulatory Visit: Payer: BC Managed Care – PPO | Admitting: Adult Health

## 2021-01-20 ENCOUNTER — Encounter: Payer: Self-pay | Admitting: Adult Health

## 2021-01-20 ENCOUNTER — Other Ambulatory Visit: Payer: Self-pay

## 2021-01-20 VITALS — BP 120/82 | HR 96 | Temp 98.7°F | Ht 69.0 in | Wt 247.0 lb

## 2021-01-20 DIAGNOSIS — M25561 Pain in right knee: Secondary | ICD-10-CM

## 2021-01-20 DIAGNOSIS — M25562 Pain in left knee: Secondary | ICD-10-CM | POA: Diagnosis not present

## 2021-01-20 DIAGNOSIS — G8929 Other chronic pain: Secondary | ICD-10-CM

## 2021-01-20 MED ORDER — METHYLPREDNISOLONE ACETATE 80 MG/ML IJ SUSP
80.0000 mg | Freq: Once | INTRAMUSCULAR | Status: AC
  Administered 2021-01-20: 80 mg via INTRA_ARTICULAR

## 2021-01-20 NOTE — Progress Notes (Signed)
Subjective:    Patient ID: Caleb Garcia, male    DOB: [birth year], 55 y.o.   MRN: [id number]  HPI 55 year old male who  has a past medical history of Cardiomyopathy, CHF (congestive heart failure) (HCC), Diabetes mellitus without complication (HCC), HTN (hypertension), Hyperlipidemia, Hypokalemia, Obesity, OSA (obstructive sleep apnea), Sleep apnea, and Systolic CHF (HCC).  He presents to the office today for chronic bilateral knee pain. He was seen at [facility] due to known end-stage arthritis.  Per patient report they wanted to try gel injections but with his deductible he has to pay out-of-pocket for the first $4000.  Today he would like to have a second opinion with a new orthopedic doctor and is wondering if there is anything that I can do to help with his knee pain.  Feels as though the pain is getting worse every day, he is late for work in the morning due to not being able to get up and move around as easily.  Last injections were back in 2020.  Pain medications have not worked well for him.   Review of Systems See HPI   Past Medical History:  Diagnosis Date   Cardiomyopathy    Non-ischemic   CHF (congestive heart failure) (HCC)    Diabetes mellitus without complication (HCC)    HTN (hypertension)    Hyperlipidemia    Hypokalemia    Obesity    OSA (obstructive sleep apnea)    Sleep apnea    had surgery- no cpap   Systolic CHF (HCC)     Social History   Socioeconomic History   Marital status: Married    Spouse name: Not on file   Number of children: Not on file   Years of education: Not on file   Highest education level: Not on file  Occupational History    Employer: UNEMPLOYED  Tobacco Use   Smoking status: Never   Smokeless tobacco: Never  Substance and Sexual Activity   Alcohol use: No   Drug use: No   Sexual activity: Not on file  Other Topics Concern   Not on file  Social History Narrative   Married 6 years   2 biologic daughters   2 stepsons,  one Programme researcher, broadcasting/film/video for [employer]    Social Determinants of Health   Financial Resource Strain: Not on file  Food Insecurity: Not on file  Transportation Needs: Not on file  Physical Activity: Not on file  Stress: Not on file  Social Connections: Not on file  Intimate Partner Violence: Not on file    Past Surgical History:  Procedure Laterality Date   avulsion fracture of left hip     CARDIAC CATHETERIZATION  10/08/08   LEFT HEART CATH AND CORONARY ANGIOGRAPHY N/A 08/03/2016   Procedure: Left Heart Cath and Coronary Angiography;  Surgeon: Dolores Patty, MD;  Location: [facility] INVASIVE CV LAB;  Service: Cardiovascular;  Laterality: N/A;   TONSILLECTOMY     UVULOPALATOPHARYNGOPLASTY      Family History  Problem Relation Age of Onset   Diabetes Mother    Hypertension Mother    Heart disease Mother    Cancer Father        Throat cancer   Stroke Father    Stroke Brother 75   Prostate cancer Neg Hx    Colon cancer Neg Hx    Colon polyps Neg Hx    Esophageal cancer Neg Hx    Rectal cancer Neg Hx  Stomach cancer Neg Hx     Allergies  Allergen Reactions   Other     Almonds-caused swelling in hands   Shrimp [Shellfish Allergy] Swelling    Current Outpatient Medications on File Prior to Visit  Medication Sig Dispense Refill   acetaminophen (TYLENOL) 500 MG tablet Take 500 mg by mouth every 6 (six) hours as needed for mild pain.     aspirin 81 MG tablet Take 81 mg by mouth daily.       atorvastatin (LIPITOR) 20 MG tablet Take 1 tablet (20 mg total) by mouth daily. 90 tablet 0   carvedilol (COREG) 25 MG tablet Take 1 tablet (25 mg total) by mouth 2 (two) times daily with a meal. 180 tablet 0   Chlorpheniramine-DM (CORICIDIN HBP COUGH/COLD PO) Take 1 tablet by mouth every 12 (twelve) hours as needed (cold symptoms).     felodipine (PLENDIL) 2.5 MG 24 hr tablet Take 1 tablet (2.5 mg total) by mouth daily. 90 tablet 0   furosemide (LASIX) 40 MG tablet Take 1 tablet (40 mg  total) by mouth daily. 90 tablet 0   hydrALAZINE (APRESOLINE) 25 MG tablet Take 1 tablet (25 mg total) by mouth 3 (three) times daily. 270 tablet 0   lisinopril (ZESTRIL) 40 MG tablet Take 1 tablet (40 mg total) by mouth daily. 90 tablet 0   metFORMIN (GLUCOPHAGE) 500 MG tablet Take 1 tablet (500 mg total) by mouth 2 (two) times daily with a meal. 180 tablet 0   potassium chloride (KLOR-CON) 10 MEQ tablet Take 1 tablet (10 mEq total) by mouth daily. 90 tablet 0   spironolactone (ALDACTONE) 25 MG tablet Take 2 tablets by mouth once daily 180 tablet 0   tadalafil (CIALIS) 20 MG tablet Take 1 tablet (20 mg total) by mouth daily as needed for erectile dysfunction. 10 tablet 3   No current facility-administered medications on file prior to visit.    BP 120/82   Pulse 96   Temp 98.7 F (37.1 C) (Oral)   Ht 5\' 9"  (1.753 m)   Wt 247 lb (112 kg)   SpO2 96%   BMI 36.48 kg/m       Objective:   Physical Exam Vitals and nursing note reviewed.  Constitutional:      Appearance: Normal appearance.  Musculoskeletal:     Right knee: Bony tenderness present. No crepitus. Decreased range of motion.     Left knee: Bony tenderness present. No crepitus. Decreased range of motion.  Skin:    General: Skin is warm and dry.     Capillary Refill: Capillary refill takes less than 2 seconds.  Neurological:     General: No focal deficit present.     Mental Status: He is alert and oriented to person, place, and time. Mental status is at baseline.     Gait: Gait abnormal (slow limping gait).      Assessment & Plan:  1. Chronic pain of right knee - Discussed options, which are limited due to nature of his advanced arthritis. We discussed steroid injections today, referral to another orthopedic doctor, and PT. He would like to have injections and referral for second option  Discussed risks and benefits of corticosteroid injection and patient consented.  After prepping skin with betadine, injected 80 mg  depomedrol and 2 cc of plain xylocaine with 22 gauge one and one half inch needle using anterolateral approach and pt tolerated well. - AMB referral to orthopedics - methylPREDNISolone acetate (DEPO-MEDROL) injection 80 mg  2. Chronic pain of left knee Discussed risks and benefits of corticosteroid injection and patient consented.  After prepping skin with betadine, injected 80 mg depomedrol and 2 cc of plain xylocaine with 22 gauge one and one half inch needle using anterolateral approach and pt tolerated well.  - AMB referral to orthopedics - methylPREDNISolone acetate (DEPO-MEDROL) injection 80 mg  Shirline Frees, NP

## 2021-01-20 NOTE — Telephone Encounter (Signed)
[facility] has received the fax. No further action needed!

## 2021-02-04 ENCOUNTER — Other Ambulatory Visit: Payer: Self-pay

## 2021-02-04 ENCOUNTER — Ambulatory Visit: Payer: BC Managed Care – PPO | Admitting: Orthopedic Surgery

## 2021-02-04 DIAGNOSIS — M1711 Unilateral primary osteoarthritis, right knee: Secondary | ICD-10-CM

## 2021-02-09 ENCOUNTER — Encounter: Payer: Self-pay | Admitting: Orthopedic Surgery

## 2021-02-09 NOTE — Progress Notes (Signed)
Office Visit Note   Patient: Caleb Garcia           Date of Birth: April 23, 1965           MRN: [id number] Visit Date: 02/04/2021 Requested by: Shirline Frees, NP [address] [place],  [place] [address] PCP: Shirline Frees, NP  Subjective: Chief Complaint  Patient presents with   Right Knee - Pain    HPI: Caleb Garcia is a 55 year old patient with severe bilateral knee pain right worse than left.  Had an injection 01/20/2021 which gave him no relief.  Reports constant pain difficulty sleeping as well as weakness and giving way.  He has known bilateral knee arthritis with loose body in the suprapatellar pouch on the left-hand side.  Hemoglobin A1c 7.3 from 12/23/2020.  Takes Tylenol without relief.  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  He has also tried tramadol for pain but nothing works.  Shots used to help but they have not helped as much within the last several years..  He has no history of DVT or pulmonary embolism.              ROS: All systems reviewed are negative as they relate to the chief complaint within the history of present illness.  Patient denies  fevers or chills.   Assessment & Plan: Visit Diagnoses:  1. Arthritis of right knee     Plan: Impression is right knee pain with severe end-stage arthritis.  Radiographically and clinically his arthritis has progressed.  He is in a fairly debilitated condition with his right knee at this time which is interfering with activities of daily living and work.  We discussed with him the risk and benefits of operative intervention which would be press-fit total knee replacement.  They include but not limited to infection nerve vessel damage knee stiffness incomplete pain relief as well as potential need for more surgery.  Patient understands risk and benefits and wishes to proceed.  We did provide Caleb Garcia with crutches today.  We will post him for surgery likely sometime in January due to his recent  cortisone injection.  All questions answered.  Follow-Up Instructions: No follow-ups on file.   Orders:  No orders of the defined types were placed in this encounter.  No orders of the defined types were placed in this encounter.     Procedures: No procedures performed   Clinical Data: No additional findings.  Objective: Vital Signs: There were no vitals taken for this visit.  Physical Exam:   Constitutional: Patient appears well-developed HEENT:  Head: Normocephalic Eyes:EOM are normal Neck: Normal range of motion Cardiovascular: Normal rate Pulmonary/chest: Effort normal Neurologic: Patient is alert Skin: Skin is warm Psychiatric: Patient has normal mood and affect   Ortho Exam: Ortho exam demonstrates mild varus alignment bilaterally.  Pedal pulses 2+ out of 4 with intact ankle dorsiflexion strength bilaterally.  He has a 5 to 7 degree flexion contracture in both knees and bends to about 95-100.  Extensor mechanism is intact.  No groin pain with internal or external rotation of the leg.  He has medial greater than lateral joint line tenderness  Specialty Comments:  No specialty comments available.  Imaging: No results found.   PMFS History: Patient Active Problem List   Diagnosis Date Noted   Chest discomfort 07/28/2016   Controlled type 2 diabetes mellitus without complication (HCC) 11/26/2013   Bilateral shoulder pain 10/31/2013   Erectile dysfunction  04/18/2013   Mixed hyperlipidemia 11/26/2009   LIMB PAIN 07/23/2009   GERD 04/29/2009   Obesity 10/22/2008   OBSTRUCTIVE SLEEP APNEA 10/22/2008   Essential hypertension 10/22/2008   CARDIOMYOPATHY 10/22/2008   CONGESTIVE HEART FAILURE, MILD [id number]   Chronic systolic heart failure ([place]) 10/16/2008   Past Medical History:  Diagnosis Date   Cardiomyopathy    Non-ischemic   CHF (congestive heart failure) ([place])    Diabetes mellitus without complication (HCC)    HTN (hypertension)    Hyperlipidemia     Hypokalemia    Obesity    OSA (obstructive sleep apnea)    Sleep apnea    had surgery- no cpap   Systolic CHF ([place])     Family History  Problem Relation Age of Onset   Diabetes Mother    Hypertension Mother    Heart disease Mother    Cancer Father        Throat cancer   Stroke Father    Stroke Brother 26   Prostate cancer Neg Hx    Colon cancer Neg Hx    Colon polyps Neg Hx    Esophageal cancer Neg Hx    Rectal cancer Neg Hx    Stomach cancer Neg Hx     Past Surgical History:  Procedure Laterality Date   avulsion fracture of left hip     CARDIAC CATHETERIZATION  10/08/08   LEFT HEART CATH AND CORONARY ANGIOGRAPHY N/A 08/03/2016   Procedure: Left Heart Cath and Coronary Angiography;  Surgeon: Jolaine Artist, MD;  Location: [place] CV LAB;  Service: Cardiovascular;  Laterality: N/A;   TONSILLECTOMY     UVULOPALATOPHARYNGOPLASTY     Social History   Occupational History    Employer: UNEMPLOYED  Tobacco Use   Smoking status: Never   Smokeless tobacco: Never  Substance and Sexual Activity   Alcohol use: No   Drug use: No   Sexual activity: Not on file

## 2021-03-24 ENCOUNTER — Ambulatory Visit (INDEPENDENT_AMBULATORY_CARE_PROVIDER_SITE_OTHER): Payer: BC Managed Care – PPO | Admitting: Adult Health

## 2021-03-24 NOTE — Progress Notes (Signed)
Entered in error

## 2021-03-26 ENCOUNTER — Ambulatory Visit (INDEPENDENT_AMBULATORY_CARE_PROVIDER_SITE_OTHER): Payer: BC Managed Care – PPO | Admitting: Adult Health

## 2021-03-26 ENCOUNTER — Encounter: Payer: Self-pay | Admitting: Adult Health

## 2021-03-26 ENCOUNTER — Other Ambulatory Visit: Payer: Self-pay

## 2021-03-26 ENCOUNTER — Ambulatory Visit (INDEPENDENT_AMBULATORY_CARE_PROVIDER_SITE_OTHER): Payer: BC Managed Care – PPO

## 2021-03-26 VITALS — BP 136/80 | HR 90 | Temp 98.7°F | Wt 247.6 lb

## 2021-03-26 DIAGNOSIS — Z0000 Encounter for general adult medical examination without abnormal findings: Secondary | ICD-10-CM | POA: Diagnosis not present

## 2021-03-26 DIAGNOSIS — Z1211 Encounter for screening for malignant neoplasm of colon: Secondary | ICD-10-CM

## 2021-03-26 DIAGNOSIS — E119 Type 2 diabetes mellitus without complications: Secondary | ICD-10-CM | POA: Diagnosis not present

## 2021-03-26 DIAGNOSIS — E782 Mixed hyperlipidemia: Secondary | ICD-10-CM

## 2021-03-26 DIAGNOSIS — Z01818 Encounter for other preprocedural examination: Secondary | ICD-10-CM

## 2021-03-26 DIAGNOSIS — M25561 Pain in right knee: Secondary | ICD-10-CM

## 2021-03-26 DIAGNOSIS — I5022 Chronic systolic (congestive) heart failure: Secondary | ICD-10-CM

## 2021-03-26 DIAGNOSIS — Z125 Encounter for screening for malignant neoplasm of prostate: Secondary | ICD-10-CM | POA: Diagnosis not present

## 2021-03-26 DIAGNOSIS — G8929 Other chronic pain: Secondary | ICD-10-CM

## 2021-03-26 DIAGNOSIS — I10 Essential (primary) hypertension: Secondary | ICD-10-CM

## 2021-03-26 DIAGNOSIS — M25562 Pain in left knee: Secondary | ICD-10-CM

## 2021-03-26 DIAGNOSIS — N521 Erectile dysfunction due to diseases classified elsewhere: Secondary | ICD-10-CM

## 2021-03-26 MED ORDER — METFORMIN HCL 500 MG PO TABS: 500.0000 mg | ORAL_TABLET | Freq: Two times a day (BID) | ORAL | 0 refills | Status: DC

## 2021-03-26 MED ORDER — HYDRALAZINE HCL 25 MG PO TABS: 25.0000 mg | ORAL_TABLET | Freq: Three times a day (TID) | ORAL | 3 refills | Status: DC

## 2021-03-26 MED ORDER — ATORVASTATIN CALCIUM 20 MG PO TABS
20.0000 mg | ORAL_TABLET | Freq: Every day | ORAL | 3 refills | Status: DC
Start: 2021-03-26 — End: 2022-04-16

## 2021-03-26 MED ORDER — TADALAFIL 20 MG PO TABS: 20.0000 mg | ORAL_TABLET | Freq: Every day | ORAL | 6 refills | Status: DC | PRN

## 2021-03-26 MED ORDER — SPIRONOLACTONE 25 MG PO TABS: 50.0000 mg | ORAL_TABLET | Freq: Every day | ORAL | 3 refills | Status: DC

## 2021-03-26 MED ORDER — FELODIPINE ER 2.5 MG PO TB24: 2.5000 mg | ORAL_TABLET | Freq: Every day | ORAL | 3 refills | Status: DC

## 2021-03-26 MED ORDER — CARVEDILOL 25 MG PO TABS
25.0000 mg | ORAL_TABLET | Freq: Two times a day (BID) | ORAL | 3 refills | Status: DC
Start: 2021-03-26 — End: 2022-04-16

## 2021-03-26 MED ORDER — LISINOPRIL 40 MG PO TABS: 40.0000 mg | ORAL_TABLET | Freq: Every day | ORAL | 3 refills | Status: DC

## 2021-03-26 MED ORDER — FUROSEMIDE 40 MG PO TABS: 40.0000 mg | ORAL_TABLET | Freq: Every day | ORAL | 3 refills | Status: DC

## 2021-03-26 MED ORDER — CARVEDILOL 25 MG PO TABS: 25.0000 mg | ORAL_TABLET | Freq: Two times a day (BID) | ORAL | 3 refills | Status: DC

## 2021-03-26 NOTE — Patient Instructions (Signed)
It was great seeing you today   We will do lab work and a chest xray on you today   We will follow up with you regarding your lab work   Someone from the CHF clinic will call you to schedule  Please call [facility] to schedule your colonoscopy  Phone: [phone]

## 2021-03-26 NOTE — Progress Notes (Signed)
Subjective:    Patient ID: Caleb Garcia, male    DOB: Oct 01, 1965, 55 y.o.   MRN: [id number]  HPI Patient presents for yearly preventative medicine examination. He is a pleasant 55 year old male who  has a past medical history of Cardiomyopathy, CHF (congestive heart failure) (HCC), Diabetes mellitus without complication (HCC), HTN (hypertension), Hyperlipidemia, Hypokalemia, Obesity, OSA (obstructive sleep apnea), Sleep apnea, and Systolic CHF (HCC).  DM type 2-currently managed with metformin 500 mg twice daily.  Does not monitor his blood sugars at home on a routine basis.  Denies episodes of hypoglycemia Lab Results  Component Value Date   HGBA1C 7.3 (A) 12/23/2020   HTN -managed with lisinopril 40 mg daily, hydralazine 25 mg 3 times daily, Plendil 2.5 mg daily, Lasix 40 mg daily, and Coreg 25 mg daily.   He denies dizziness, lightheadedness, chest pain, or shortness of breath. BP Readings from Last 3 Encounters:  03/26/21 136/80  01/20/21 120/82  12/23/20 128/82   CHF -has been seen at the CHF clinic but has not been seen since 2018.  He is managed with Coreg, lisinopril, hydralazine, and Lasix.  His last echo in 2016 showed an EF of 45 to 50%.  RV was normal. He would like to be referred back to CHF clinic. He denies lower extremity edema, chest pain, SOB, or palpitations   Pre Operative Clearance - will be having right total knee replacement done on 04/21/2021. The plan is to have a left total knee replacement after he heals from the first.   ED- uses Cialis  as needed  All immunizations and health maintenance protocols were reviewed with the patient and needed orders were placed. Refuses all vaccinations   Appropriate screening laboratory values were ordered for the patient including screening of hyperlipidemia, renal function and hepatic function. If indicated by BPH, a PSA was ordered.  Medication reconciliation,  past medical history, social history, problem list and  allergies were reviewed in detail with the patient  Goals were established with regard to weight loss, exercise, and  diet in compliance with medications  He is overdue for routine screening colonoscopy. GI tried to contact him but he never returned the phone call.    Review of Systems  Constitutional: Negative.   HENT: Negative.    Eyes: Negative.   Respiratory: Negative.    Cardiovascular: Negative.   Gastrointestinal: Negative.   Endocrine: Negative.   Genitourinary: Negative.   Musculoskeletal:  Positive for arthralgias and gait problem.  Skin: Negative.   Allergic/Immunologic: Negative.   Hematological: Negative.   Psychiatric/Behavioral: Negative.    All other systems reviewed and are negative.  Past Medical History:  Diagnosis Date   Cardiomyopathy    Non-ischemic   CHF (congestive heart failure) (HCC)    Diabetes mellitus without complication (HCC)    HTN (hypertension)    Hyperlipidemia    Hypokalemia    Obesity    OSA (obstructive sleep apnea)    Sleep apnea    had surgery- no cpap   Systolic CHF (HCC)     Social History   Socioeconomic History   Marital status: Married    Spouse name: Not on file   Number of children: Not on file   Years of education: Not on file   Highest education level: Not on file  Occupational History    Employer: UNEMPLOYED  Tobacco Use   Smoking status: Never   Smokeless tobacco: Never  Substance and Sexual Activity   Alcohol use:  No   Drug use: No   Sexual activity: Not on file  Other Topics Concern   Not on file  Social History Narrative   Married 6 years   2 biologic daughters   2 stepsons, one Programme researcher, broadcasting/film/video for [employer]    Social Determinants of Health   Financial Resource Strain: Not on file  Food Insecurity: Not on file  Transportation Needs: Not on file  Physical Activity: Not on file  Stress: Not on file  Social Connections: Not on file  Intimate Partner Violence: Not on file    Past Surgical  History:  Procedure Laterality Date   avulsion fracture of left hip     CARDIAC CATHETERIZATION  10/08/08   LEFT HEART CATH AND CORONARY ANGIOGRAPHY N/A 08/03/2016   Procedure: Left Heart Cath and Coronary Angiography;  Surgeon: Dolores Patty, MD;  Location: [facility] INVASIVE CV LAB;  Service: Cardiovascular;  Laterality: N/A;   TONSILLECTOMY     UVULOPALATOPHARYNGOPLASTY      Family History  Problem Relation Age of Onset   Diabetes Mother    Hypertension Mother    Heart disease Mother    Cancer Father        Throat cancer   Stroke Father    Stroke Brother 46   Prostate cancer Neg Hx    Colon cancer Neg Hx    Colon polyps Neg Hx    Esophageal cancer Neg Hx    Rectal cancer Neg Hx    Stomach cancer Neg Hx     Allergies  Allergen Reactions   Other     Almonds-caused swelling in hands   Shrimp [Shellfish Allergy] Swelling    Current Outpatient Medications on File Prior to Visit  Medication Sig Dispense Refill   acetaminophen (TYLENOL) 500 MG tablet Take 500 mg by mouth every 6 (six) hours as needed for mild pain.     aspirin 81 MG tablet Take 81 mg by mouth daily.       atorvastatin (LIPITOR) 20 MG tablet Take 1 tablet (20 mg total) by mouth daily. 90 tablet 0   carvedilol (COREG) 25 MG tablet Take 1 tablet (25 mg total) by mouth 2 (two) times daily with a meal. 180 tablet 0   Chlorpheniramine-DM (CORICIDIN HBP COUGH/COLD PO) Take 1 tablet by mouth every 12 (twelve) hours as needed (cold symptoms).     felodipine (PLENDIL) 2.5 MG 24 hr tablet Take 1 tablet (2.5 mg total) by mouth daily. 90 tablet 0   furosemide (LASIX) 40 MG tablet Take 1 tablet (40 mg total) by mouth daily. 90 tablet 0   lisinopril (ZESTRIL) 40 MG tablet Take 1 tablet (40 mg total) by mouth daily. 90 tablet 0   potassium chloride (KLOR-CON) 10 MEQ tablet Take 1 tablet (10 mEq total) by mouth daily. 90 tablet 0   spironolactone (ALDACTONE) 25 MG tablet Take 2 tablets by mouth once daily 180 tablet 0   tadalafil  (CIALIS) 20 MG tablet Take 1 tablet (20 mg total) by mouth daily as needed for erectile dysfunction. 10 tablet 3   hydrALAZINE (APRESOLINE) 25 MG tablet Take 1 tablet (25 mg total) by mouth 3 (three) times daily. 270 tablet 0   metFORMIN (GLUCOPHAGE) 500 MG tablet Take 1 tablet (500 mg total) by mouth 2 (two) times daily with a meal. 180 tablet 0   No current facility-administered medications on file prior to visit.    BP 136/80 (BP Location: Left Arm, Patient Position: Sitting,  Cuff Size: Normal)    Pulse 90    Temp 98.7 F (37.1 C) (Oral)    Wt 247 lb 9.6 oz (112.3 kg)    SpO2 98%    BMI 36.56 kg/m        Objective:   Physical Exam Vitals and nursing note reviewed.  Constitutional:      General: He is not in acute distress.    Appearance: Normal appearance. He is well-developed. He is obese.  HENT:     Head: Normocephalic and atraumatic.     Right Ear: Tympanic membrane, ear canal and external ear normal. There is no impacted cerumen.     Left Ear: Tympanic membrane, ear canal and external ear normal. There is no impacted cerumen.     Nose: Nose normal. No congestion or rhinorrhea.     Mouth/Throat:     Mouth: Mucous membranes are moist.     Pharynx: Oropharynx is clear. No oropharyngeal exudate or posterior oropharyngeal erythema.  Eyes:     General:        Right eye: No discharge.        Left eye: No discharge.     Extraocular Movements: Extraocular movements intact.     Conjunctiva/sclera: Conjunctivae normal.     Pupils: Pupils are equal, round, and reactive to light.  Neck:     Vascular: No carotid bruit.     Trachea: No tracheal deviation.  Cardiovascular:     Rate and Rhythm: Normal rate and regular rhythm.     Pulses: Normal pulses.     Heart sounds: Normal heart sounds. No murmur heard.   No friction rub. No gallop.  Pulmonary:     Effort: Pulmonary effort is normal. No respiratory distress.     Breath sounds: Normal breath sounds. No stridor. No wheezing,  rhonchi or rales.  Chest:     Chest wall: No tenderness.  Abdominal:     General: Bowel sounds are normal. There is no distension.     Palpations: Abdomen is soft. There is no mass.     Tenderness: There is no abdominal tenderness. There is no right CVA tenderness, left CVA tenderness, guarding or rebound.     Hernia: No hernia is present.  Musculoskeletal:        General: No swelling, deformity or signs of injury.     Right knee: Bony tenderness present. Decreased range of motion.     Left knee: Bony tenderness present. Decreased range of motion.     Right lower leg: No edema.     Left lower leg: No edema.  Lymphadenopathy:     Cervical: No cervical adenopathy.  Skin:    General: Skin is warm and dry.     Capillary Refill: Capillary refill takes less than 2 seconds.     Coloration: Skin is not jaundiced or pale.     Findings: No bruising, erythema, lesion or rash.  Neurological:     General: No focal deficit present.     Mental Status: He is alert and oriented to person, place, and time.     Cranial Nerves: No cranial nerve deficit.     Sensory: No sensory deficit.     Motor: No weakness.     Coordination: Coordination normal.     Gait: Gait normal.     Deep Tendon Reflexes: Reflexes normal.  Psychiatric:        Mood and Affect: Mood normal.        Behavior: Behavior normal.  Thought Content: Thought content normal.        Judgment: Judgment normal.      Assessment & Plan:  1. Routine general medical examination at a health care facility - Encouraged Mediterranean diet. Hopefully will be able to exercise more once he has knee replacements - CBC with Differential/Platelet; Future - Comprehensive metabolic panel; Future - Hemoglobin A1c; Future - Lipid panel; Future - TSH; Future   2. Controlled type 2 diabetes mellitus without complication, without long-term current use of insulin (HCC) - Consider increase in Metformin  - Follow up in three months  - CBC with  Differential/Platelet; Future - Comprehensive metabolic panel; Future - Hemoglobin A1c; Future - Lipid panel; Future - TSH; Future - metFORMIN (GLUCOPHAGE) 500 MG tablet; Take 1 tablet (500 mg total) by mouth 2 (two) times daily with a meal.  Dispense: 180 tablet; Refill: 0  3. Essential hypertension - Controlled. No change in medications - CBC with Differential/Platelet; Future - Comprehensive metabolic panel; Future - Hemoglobin A1c; Future - Lipid panel; Future - TSH; Future - spironolactone (ALDACTONE) 25 MG tablet; Take 2 tablets (50 mg total) by mouth daily.  Dispense: 180 tablet; Refill: 3 - lisinopril (ZESTRIL) 40 MG tablet; Take 1 tablet (40 mg total) by mouth daily.  Dispense: 90 tablet; Refill: 3 - hydrALAZINE (APRESOLINE) 25 MG tablet; Take 1 tablet (25 mg total) by mouth 3 (three) times daily.  Dispense: 270 tablet; Refill: 3 - furosemide (LASIX) 40 MG tablet; Take 1 tablet (40 mg total) by mouth daily.  Dispense: 90 tablet; Refill: 3 - felodipine (PLENDIL) 2.5 MG 24 hr tablet; Take 1 tablet (2.5 mg total) by mouth daily.  Dispense: 90 tablet; Refill: 3 - carvedilol (COREG) 25 MG tablet; Take 1 tablet (25 mg total) by mouth 2 (two) times daily with a meal.  Dispense: 180 tablet; Refill: 3  4. Chronic systolic heart failure (HCC) - No edema noted today. Continue with current medications until seen by CHF clinic  - AMB referral to CHF clinic - spironolactone (ALDACTONE) 25 MG tablet; Take 2 tablets (50 mg total) by mouth daily.  Dispense: 180 tablet; Refill: 3 - lisinopril (ZESTRIL) 40 MG tablet; Take 1 tablet (40 mg total) by mouth daily.  Dispense: 90 tablet; Refill: 3 - hydrALAZINE (APRESOLINE) 25 MG tablet; Take 1 tablet (25 mg total) by mouth 3 (three) times daily.  Dispense: 270 tablet; Refill: 3 - furosemide (LASIX) 40 MG tablet; Take 1 tablet (40 mg total) by mouth daily.  Dispense: 90 tablet; Refill: 3 - felodipine (PLENDIL) 2.5 MG 24 hr tablet; Take 1 tablet (2.5 mg  total) by mouth daily.  Dispense: 90 tablet; Refill: 3 - carvedilol (COREG) 25 MG tablet; Take 1 tablet (25 mg total) by mouth 2 (two) times daily with a meal.  Dispense: 180 tablet; Refill: 3  5. Mixed hyperlipidemia - Consider increase in statin  - CBC with Differential/Platelet; Future - Comprehensive metabolic panel; Future - Hemoglobin A1c; Future - Lipid panel; Future - TSH; Future - atorvastatin (LIPITOR) 20 MG tablet; Take 1 tablet (20 mg total) by mouth daily.  Dispense: 90 tablet; Refill: 3  6. Bilateral chronic knee pain   7. Preoperative clearance  - CBC with Differential/Platelet; Future - Comprehensive metabolic panel; Future - Hemoglobin A1c; Future - Lipid panel; Future - TSH; Future - EKG 12-Lead- SR- negative T wave , rate 79. Consistent with previous EKGS - DG Chest 2 View; Future  8. Prostate cancer screening - PSA; Future  9. Colon  cancer screening - phone number given for [facility] - he will call and schedule  10. Erectile dysfunction due to diseases classified elsewhere  - tadalafil (CIALIS) 20 MG tablet; Take 1 tablet (20 mg total) by mouth daily as needed for erectile dysfunction.  Dispense: 10 tablet; Refill: 6  Shirline Frees, NP

## 2021-03-27 LAB — LIPID PANEL
Cholesterol: 231 mg/dL — ABNORMAL HIGH (ref 0–200)
HDL: 54.4 mg/dL (ref >39.00)
LDL Cholesterol: 140 mg/dL — ABNORMAL HIGH (ref 0–99)
NonHDL: 176.21
Total CHOL/HDL Ratio: 4
Triglycerides: 181 mg/dL — ABNORMAL HIGH (ref 0.0–149.0)
VLDL: 36.2 mg/dL (ref 0.0–40.0)

## 2021-03-27 LAB — COMPREHENSIVE METABOLIC PANEL
ALT: 14 U/L (ref 0–53)
AST: 13 U/L (ref 0–37)
Albumin: 4.5 g/dL (ref 3.5–5.2)
Alkaline Phosphatase: 36 U/L — ABNORMAL LOW (ref 39–117)
BUN: 12 mg/dL (ref 6–23)
CO2: 26 mEq/L (ref 19–32)
Calcium: 9.7 mg/dL (ref 8.4–10.5)
Chloride: 100 mEq/L (ref 96–112)
Creatinine, Ser: 0.99 mg/dL (ref 0.40–1.50)
GFR: 85.81 mL/min (ref >60.00)
Glucose, Bld: 111 mg/dL — ABNORMAL HIGH (ref 70–99)
Potassium: 3.9 mEq/L (ref 3.5–5.1)
Sodium: 136 mEq/L (ref 135–145)
Total Bilirubin: 0.6 mg/dL (ref 0.2–1.2)
Total Protein: 7.4 g/dL (ref 6.0–8.3)

## 2021-03-27 LAB — CBC WITH DIFFERENTIAL/PLATELET
Basophils Absolute: 0 10*3/uL (ref 0.0–0.1)
Basophils Relative: 0.7 % (ref 0.0–3.0)
Eosinophils Absolute: 0.1 10*3/uL (ref 0.0–0.7)
Eosinophils Relative: 1.2 % (ref 0.0–5.0)
HCT: 41.4 % (ref 39.0–52.0)
Hemoglobin: 14 g/dL (ref 13.0–17.0)
Lymphocytes Relative: 40.6 % (ref 12.0–46.0)
Lymphs Abs: 2.6 10*3/uL (ref 0.7–4.0)
MCHC: 33.7 g/dL (ref 30.0–36.0)
MCV: 92.3 fl (ref 78.0–100.0)
Monocytes Absolute: 0.5 10*3/uL (ref 0.1–1.0)
Monocytes Relative: 7.6 % (ref 3.0–12.0)
Neutro Abs: 3.1 10*3/uL (ref 1.4–7.7)
Neutrophils Relative %: 49.9 % (ref 43.0–77.0)
Platelets: 221 10*3/uL (ref 150.0–400.0)
RBC: 4.48 Mil/uL (ref 4.22–5.81)
RDW: 14 % (ref 11.5–15.5)
WBC: 6.3 10*3/uL (ref 4.0–10.5)

## 2021-03-27 LAB — TSH: TSH: 0.89 u[IU]/mL (ref 0.35–5.50)

## 2021-03-27 LAB — PSA: PSA: 0.54 ng/mL (ref 0.10–4.00)

## 2021-03-27 LAB — HEMOGLOBIN A1C: Hgb A1c MFr Bld: 7.7 % — ABNORMAL HIGH (ref 4.6–6.5)

## 2021-04-08 ENCOUNTER — Telehealth: Payer: Self-pay | Admitting: Orthopedic Surgery

## 2021-04-08 NOTE — Telephone Encounter (Signed)
[employer] forms received. To [employer].

## 2021-04-14 ENCOUNTER — Other Ambulatory Visit: Payer: Self-pay | Admitting: Adult Health

## 2021-04-14 DIAGNOSIS — I10 Essential (primary) hypertension: Secondary | ICD-10-CM

## 2021-04-14 NOTE — Pre-Procedure Instructions (Addendum)
Surgical Instructions    Your procedure is scheduled on Tuesday 04/21/21.   Report to [facility] Main Entrance "A" at 05:30 A.M., then check in with the Admitting office.  Call this number if you have problems the morning of surgery:  [phone]   If you have any questions prior to your surgery date call [phone]: Open Monday-Friday 8am-4pm    Remember:  Do not eat after midnight the night before your surgery  You may drink clear liquids until 04:30 A.M. the morning of your surgery.   Clear liquids allowed are: Water, Non-Citrus Juices (without pulp), Carbonated Beverages, Clear Tea, Black Coffee Only, and Gatorade   Enhanced Recovery after Surgery for Orthopedics Enhanced Recovery after Surgery is a protocol used to improve the stress on your body and your recovery after surgery.  Patient Instructions The day of surgery (if you have diabetes):  Drink ONE small 10 oz bottle of water by 04:30 am the morning of surgery This bottle was given to you during your hospital  pre-op appointment visit.  Nothing else to drink after completing the  Small 10 oz bottle of water.         If you have questions, please contact your surgeons office.     Take these medicines the morning of surgery with A SIP OF WATER   atorvastatin (LIPITOR)   carvedilol (COREG)  felodipine (PLENDIL)   hydrALAZINE (APRESOLINE)   Take these medicines if needed:   acetaminophen (TYLENOL)  As of today, STOP taking any Aspirin (unless otherwise instructed by your surgeon) Aleve, Naproxen, Ibuprofen, Motrin, Advil, Goody's, BC's, all herbal medications, fish oil, and all vitamins.            WHAT DO I DO ABOUT MY DIABETES MEDICATION?   Do not take oral diabetes medicines (pills) the morning of surgery.  DO NOT TAKE metFORMIN (GLUCOPHAGE) the day of surgery.   The day of surgery, do not take other diabetes injectables, including Byetta (exenatide), Bydureon (exenatide ER), Victoza (liraglutide), or  Trulicity (dulaglutide).  HOW TO MANAGE YOUR DIABETES BEFORE AND AFTER SURGERY  Why is it important to control my blood sugar before and after surgery? Improving blood sugar levels before and after surgery helps healing and can limit problems. A way of improving blood sugar control is eating a healthy diet by:  Eating less sugar and carbohydrates  Increasing activity/exercise  Talking with your doctor about reaching your blood sugar goals High blood sugars (greater than 180 mg/dL) can raise your risk of infections and slow your recovery, so you will need to focus on controlling your diabetes during the weeks before surgery. Make sure that the doctor who takes care of your diabetes knows about your planned surgery including the date and location.  How do I manage my blood sugar before surgery? Check your blood sugar at least 4 times a day, starting 2 days before surgery, to make sure that the level is not too high or low.  Check your blood sugar the morning of your surgery when you wake up and every 2 hours until you get to the Short Stay unit.  If your blood sugar is less than 70 mg/dL, you will need to treat for low blood sugar: Do not take insulin. Treat a low blood sugar (less than 70 mg/dL) with  cup of clear juice (cranberry or apple), 4 glucose tablets, OR glucose gel. Recheck blood sugar in 15 minutes after treatment (to make sure it is greater than 70 mg/dL). If  your blood sugar is not greater than 70 mg/dL on recheck, call [phone] for further instructions. Report your blood sugar to the short stay nurse when you get to Short Stay.  If you are admitted to the hospital after surgery: Your blood sugar will be checked by the staff and you will probably be given insulin after surgery (instead of oral diabetes medicines) to make sure you have good blood sugar levels. The goal for blood sugar control after surgery is 80-180 mg/dL.           Do NOT Smoke (Tobacco/Vaping) or drink  Alcohol 24 hours prior to your procedure.  If you use a CPAP at night, you may bring all equipment for your overnight stay.   Contacts, glasses, piercing's, hearing aid's, dentures or partials may not be worn into surgery, please bring cases for these belongings.    For patients admitted to the hospital, discharge time will be determined by your treatment team.   Patients discharged the day of surgery will not be allowed to drive home, and someone needs to stay with them for 24 hours.  NO VISITORS WILL BE ALLOWED IN PRE-OP WHERE PATIENTS GET READY FOR SURGERY.  ONLY 1 SUPPORT PERSON MAY BE PRESENT IN THE WAITING ROOM WHILE YOU ARE IN SURGERY.  IF YOU ARE TO BE ADMITTED, ONCE YOU ARE IN YOUR ROOM YOU WILL BE ALLOWED TWO (2) VISITORS.  Minor children may have two parents present. Special consideration for safety and communication needs will be reviewed on a case by case basis.   Special instructions:   [place]- Preparing For Surgery  Before surgery, you can play an important role. Because skin is not sterile, your skin needs to be as free of germs as possible. You can reduce the number of germs on your skin by washing with CHG (chlorahexidine gluconate) Soap before surgery.  CHG is an antiseptic cleaner which kills germs and bonds with the skin to continue killing germs even after washing.    Oral Hygiene is also important to reduce your risk of infection.  Remember - BRUSH YOUR TEETH THE MORNING OF SURGERY WITH YOUR REGULAR TOOTHPASTE  Please do not use if you have an allergy to CHG or antibacterial soaps. If your skin becomes reddened/irritated stop using the CHG.  Do not shave (including legs and underarms) for at least 48 hours prior to first CHG shower. It is OK to shave your face.  Please follow these instructions carefully.   Shower the NIGHT BEFORE SURGERY and the MORNING OF SURGERY  If you chose to wash your hair, wash your hair first as usual with your normal shampoo.  After  you shampoo, rinse your hair and body thoroughly to remove the shampoo.  Use CHG Soap as you would any other liquid soap. You can apply CHG directly to the skin and wash gently with a scrungie or a clean washcloth.   Apply the CHG Soap to your body ONLY FROM THE NECK DOWN.  Do not use on open wounds or open sores. Avoid contact with your eyes, ears, mouth and genitals (private parts). Wash Face and genitals (private parts)  with your normal soap.   Wash thoroughly, paying special attention to the area where your surgery will be performed.  Thoroughly rinse your body with warm water from the neck down.  DO NOT shower/wash with your normal soap after using and rinsing off the CHG Soap.  Pat yourself dry with a CLEAN TOWEL.  Wear CLEAN PAJAMAS  to bed the night before surgery  Place CLEAN SHEETS on your bed the night before your surgery  DO NOT SLEEP WITH PETS.   Day of Surgery: Shower with CHG soap. Do not wear jewelry, make up, nail polish, gel polish, artificial nails, or any other type of covering on natural nails including finger and toenails. If patients have artificial nails, gel coating, etc. that need to be removed by a nail salon please have this removed prior to surgery. Surgery may need to be canceled/delayed if the surgeon/ anesthesia feels like the patient is unable to be adequately monitored. Do not wear lotions, powders, perfumes/colognes, or deodorant. Do not shave 48 hours prior to surgery.  Men may shave face and neck. Do not bring valuables to the hospital. [facility] is not responsible for any belongings or valuables. Wear Clean/Comfortable clothing the morning of surgery Remember to brush your teeth WITH YOUR REGULAR TOOTHPASTE.   Please read over the following fact sheets that you were given.   3 days prior to your procedure or After your COVID test   You are not required to quarantine however you are required to wear a well-fitting mask when you are out and  around people not in your household. If your mask becomes wet or soiled, replace with a new one.   Wash your hands often with soap and water for 20 seconds or clean your hands with an alcohol-based hand sanitizer that contains at least 60% alcohol.   Do not share personal items.   Notify your provider:  o if you are in close contact with someone who has COVID  o or if you develop a fever of 100.4 or greater, sneezing, cough, sore throat, shortness of breath or body aches.

## 2021-04-15 ENCOUNTER — Encounter (HOSPITAL_COMMUNITY): Payer: Self-pay

## 2021-04-15 ENCOUNTER — Encounter (HOSPITAL_COMMUNITY)
Admission: RE | Admit: 2021-04-15 | Discharge: 2021-04-15 | Disposition: A | Discharge status: Home / Self Care | Payer: BC Managed Care – PPO | Source: Ambulatory Visit | Attending: Orthopedic Surgery | Admitting: Orthopedic Surgery

## 2021-04-15 ENCOUNTER — Other Ambulatory Visit: Payer: Self-pay

## 2021-04-15 VITALS — BP 127/86 | HR 92 | Temp 98.7°F | Resp 18 | Ht 70.0 in | Wt 241.4 lb

## 2021-04-15 DIAGNOSIS — G4733 Obstructive sleep apnea (adult) (pediatric): Secondary | ICD-10-CM | POA: Diagnosis not present

## 2021-04-15 DIAGNOSIS — I5022 Chronic systolic (congestive) heart failure: Secondary | ICD-10-CM | POA: Insufficient documentation

## 2021-04-15 DIAGNOSIS — I428 Other cardiomyopathies: Secondary | ICD-10-CM | POA: Insufficient documentation

## 2021-04-15 DIAGNOSIS — E785 Hyperlipidemia, unspecified: Secondary | ICD-10-CM | POA: Diagnosis not present

## 2021-04-15 DIAGNOSIS — I110 Hypertensive heart disease with heart failure: Secondary | ICD-10-CM | POA: Insufficient documentation

## 2021-04-15 DIAGNOSIS — E119 Type 2 diabetes mellitus without complications: Secondary | ICD-10-CM | POA: Diagnosis not present

## 2021-04-15 DIAGNOSIS — I639 Cerebral infarction, unspecified: Secondary | ICD-10-CM | POA: Diagnosis not present

## 2021-04-15 DIAGNOSIS — F17210 Nicotine dependence, cigarettes, uncomplicated: Secondary | ICD-10-CM | POA: Insufficient documentation

## 2021-04-15 DIAGNOSIS — Z01818 Encounter for other preprocedural examination: Secondary | ICD-10-CM

## 2021-04-15 DIAGNOSIS — Z01812 Encounter for preprocedural laboratory examination: Secondary | ICD-10-CM | POA: Diagnosis not present

## 2021-04-15 HISTORY — DX: Unspecified osteoarthritis, unspecified site: M19.90

## 2021-04-15 LAB — CBC
HCT: 44.8 % (ref 39.0–52.0)
Hemoglobin: 14.8 g/dL (ref 13.0–17.0)
MCH: 30.7 pg (ref 26.0–34.0)
MCHC: 33 g/dL (ref 30.0–36.0)
MCV: 92.9 fL (ref 80.0–100.0)
Platelets: 249 10*3/uL (ref 150–400)
RBC: 4.82 MIL/uL (ref 4.22–5.81)
RDW: 13 % (ref 11.5–15.5)
WBC: 8.8 10*3/uL (ref 4.0–10.5)
nRBC: 0 % (ref 0.0–0.2)

## 2021-04-15 LAB — URINALYSIS, ROUTINE W REFLEX MICROSCOPIC
Bilirubin Urine: NEGATIVE
Glucose, UA: NEGATIVE mg/dL
Hgb urine dipstick: NEGATIVE
Ketones, ur: 15 mg/dL — AB
Leukocytes,Ua: NEGATIVE
Nitrite: NEGATIVE
Protein, ur: NEGATIVE mg/dL
Specific Gravity, Urine: 1.025 (ref 1.005–1.030)
pH: 6 (ref 5.0–8.0)

## 2021-04-15 LAB — GLUCOSE, CAPILLARY: Glucose-Capillary: 104 mg/dL — ABNORMAL HIGH (ref 70–99)

## 2021-04-15 LAB — SURGICAL PCR SCREEN
MRSA, PCR: NEGATIVE
Staphylococcus aureus: NEGATIVE

## 2021-04-15 NOTE — Progress Notes (Addendum)
PCP - Dr. Shirline Frees Cardiologist - Dr. Lyndee Hensen  PPM/ICD - n/a  Chest x-ray - 03/26/21 EKG - 03/26/21 Stress Test - denies ECHO - 06/18/14 Cardiac Cath - 08/03/16  Sleep Study - Mild OSA diagnosed 05/08/09 CPAP - Does not use. Per patient has not used since weight loss and uvuloplasty.   CBG today= 104 Patient states he does not check his blood sugar A1 C 7.7 on 03/26/21  Blood Thinner Instructions: n/a Aspirin Instructions: As of today stop taking Aspirin unless otherwise instructed by your surgeon.   ERAS Protcol - Yes PRE-SURGERY Ensure or G2- G2  COVID TEST- Scheduled for Monday 04/20/21. Patient is aware of date, time, and location.    Anesthesia review: Yes. Cardiac history. Patient denies needing clearance for surgery.   Patient denies shortness of breath, fever, cough and chest pain at PAT appointment   All instructions explained to the patient, with a verbal understanding of the material. Patient agrees to go over the instructions while at home for a better understanding. Patient also instructed to self quarantine after being tested for COVID-19. The opportunity to ask questions was provided.

## 2021-04-16 ENCOUNTER — Encounter (HOSPITAL_COMMUNITY): Payer: Self-pay

## 2021-04-16 LAB — URINE CULTURE: Culture: NO GROWTH

## 2021-04-16 NOTE — Anesthesia Preprocedure Evaluation (Addendum)
Anesthesia Evaluation  Patient identified by MRN, date of birth, ID band Patient awake    Reviewed: Allergy & Precautions, NPO status , Patient's Chart, lab work & pertinent test results, reviewed documented beta blocker date and time   History of Anesthesia Complications Negative for: history of anesthetic complications  Airway Mallampati: IV  TM Distance: >3 FB Neck ROM: Full    Dental  (+) Teeth Intact, Dental Advisory Given   Pulmonary neg shortness of breath, neg sleep apnea, neg COPD, neg recent URI, Current Smoker and Patient abstained from smoking.,    breath sounds clear to auscultation       Cardiovascular hypertension, Pt. on medications and Pt. on home beta blockers +CHF   Rhythm:Regular  Cardiac cath 08/03/16:   The left ventricular ejection fraction is 45-50% by visual estimate.  Assessment & Plan: 1. Normal coronary arteries 2. EF ~50%  3. Normal LVEDP  Continue medical management.     Neuro/Psych negative psych ROS   GI/Hepatic Neg liver ROS,   Endo/Other  diabetes, Type 2, Oral Hypoglycemic AgentsLab Results      Component                Value               Date                      HGBA1C                   7.7 (H)             03/26/2021             Renal/GU negative Renal ROSLab Results      Component                Value               Date                      CREATININE               0.99                03/26/2021                Musculoskeletal  (+) Arthritis ,   Abdominal   Peds  Hematology negative hematology ROS (+) Lab Results      Component                Value               Date                      WBC                      8.8                 04/15/2021                HGB                      14.8                04/15/2021                HCT  44.8                04/15/2021                MCV                      92.9                04/15/2021                 PLT                      249                 04/15/2021            Denies blood thinners   Anesthesia Other Findings History includes smoking (occasional cigar), nonischemic cardiomyopathy (diagnosed 10/2008, likely due to HTN), chronic systolic CHF, HTN, DM2, OSA (s/p UPPP, tonsillectomy; mild OSA w/AHI ), HLD, CVA (left parietal-occipital 10/2008).  He was diagnosed with non-ischemic CM and chronic systolic CHF in 8099 (EF 83-38%, no significant CAD). He saw HF cardiologist Dr. Haroldine Laws fairly regularly up to 2016, with last office visits on 06/18/14 and 07/28/16. At 07/28/16 visit, volume status was okay, but reported chest pain with somewhat exertional component, so LHC ordered. Updating echo was also planned, but did not happen. He did follow through with LHC on 08/03/16 which showed normal coronaries with EF ~ 50% (previously 45-50%). He remains on ASA 81 mg, Lipitor 20 mg, Coreg 25 mg BID, felodipine 2.5 mg daily, Lasix 40 mg daily, hydralazine 25 mg TID, KCL 10 mEq, spironolactone 25 mg daily per primary care.   He had a preventative medicine exam with preoperative evaluation by his PCP Dorothyann Peng, NP on 03/26/21. EKG stable. No LE edema, chest pain, SOB, palpitations, dizziness, lightheadedness. BP stable. A1c 7.7%. Continue current medications, consider increased statin. Patient expressed desire to get re-established with the HF Clinic, so referral was sent, as well as referral for colon cancer screening. These visits have not yet been scheduled, but Nafziger, Tommi Rumps, NP did sign a surgical clearance note clearing Mr. Jeancharles for right TKA from both a medical and cardiac standpoint.   Reviewed above with anesthesiologist Tamela Gammon, MD. Patient with normal coronaries with stable EF to ~ 50% in 2018. Denied CV/HF symptoms at recent PCP preoperative evaluation and cleared for surgery. Anesthesia team to evaluate on the day of surgery to assess for any acute changes. I communicated with Debbie at Dr.  Randel Pigg office that last A1c was 7.7.%.  Reproductive/Obstetrics                            Anesthesia Physical Anesthesia Plan  ASA: 2  Anesthesia Plan: MAC, Regional and Spinal   Post-op Pain Management: Regional block   Induction:   PONV Risk Score and Plan: 0 and Propofol infusion and Treatment may vary due to age or medical condition  Airway Management Planned: Nasal Cannula  Additional Equipment: None  Intra-op Plan:   Post-operative Plan:   Informed Consent: I have reviewed the patients History and Physical, chart, labs and discussed the procedure including the risks, benefits and alternatives for the proposed anesthesia with the patient or authorized representative who has indicated his/her understanding and acceptance.     Dental advisory given  Plan Discussed with: CRNA and Anesthesiologist  Anesthesia Plan Comments: (PAT note  written 04/16/2021 by Myra Gianotti, PA-C. )       Anesthesia Quick Evaluation

## 2021-04-16 NOTE — Progress Notes (Addendum)
Anesthesia Chart Review:  Case: [id number] Date/Time: 04/21/21 0715   Procedure: RIGHT TOTAL KNEE ARTHROPLASTY (Right: Knee)   Anesthesia type: Spinal   Pre-op diagnosis: right knee osteoarthritis   [facility] / [facility]   Surgeons: Cammy Copa, MD       DISCUSSION: Patient is a 56 year old male scheduled for the above procedure.  History includes smoking (occasional cigar), nonischemic cardiomyopathy (diagnosed 10/2008, likely due to HTN), chronic systolic CHF, HTN, DM2, OSA (s/p UPPP, tonsillectomy; mild OSA w/AHI ), HLD, CVA (left parietal-occipital 10/2008).  He was diagnosed with non-ischemic CM and chronic systolic CHF in 2010 (EF 25-30%, no significant CAD). He saw HF cardiologist Dr. Gala Romney fairly regularly up to 2016, with last office visits on 06/18/14 and 07/28/16. At 07/28/16 visit, volume status was okay, but reported chest pain with somewhat exertional component, so LHC ordered. Updating echo was also planned, but did not happen. He did follow through with LHC on 08/03/16 which showed normal coronaries with EF ~ 50% (previously 45-50%). He remains on ASA 81 mg, Lipitor 20 mg, Coreg 25 mg BID, felodipine 2.5 mg daily, Lasix 40 mg daily, hydralazine 25 mg TID, KCL 10 mEq, spironolactone 25 mg daily per primary care.   He had a preventative medicine exam with preoperative evaluation by his PCP Shirline Frees, NP on 03/26/21. EKG stable. No LE edema, chest pain, SOB, palpitations, dizziness, lightheadedness. BP stable. A1c 7.7%. Continue current medications, consider increased statin. Patient expressed desire to get re-established with the HF Clinic, so referral was sent, as well as referral for colon cancer screening. These visits have not yet been scheduled, but Nafziger, Kandee Keen, NP did sign a surgical clearance note clearing Caleb Garcia for right TKA from both a medical and cardiac standpoint.   Reviewed above with anesthesiologist Adonis Huguenin, MD. Patient with normal  coronaries with stable EF to ~ 50% in 2018. Denied CV/HF symptoms at recent PCP preoperative evaluation and cleared for surgery. Anesthesia team to evaluate on the day of surgery to assess for any acute changes. I communicated with Debbie at Dr. Diamantina Providence office that last A1c was 7.7.%.  Presurgical COVID-19 test is scheduled for 04/20/21.    VS: BP 127/86    Pulse 92    Temp 37.1 C (Oral)    Resp 18    Ht 5\' 10"  (1.778 m)    Wt 109.5 kg    SpO2 99%    BMI 34.64 kg/m   PROVIDERS: , NP is PCP Shirline Frees, MD is HF    LABS: Labs from 04/15/21 and 03/26/21 noted. Results include:  Lab Results  Component Value Date   WBC 8.8 04/15/2021   HGB 14.8 04/15/2021   HCT 44.8 04/15/2021   PLT 249 04/15/2021   GLUCOSE 111 (H) 03/26/2021   ALT 14 03/26/2021   AST 13 03/26/2021   NA 136 03/26/2021   K 3.9 03/26/2021   CL 100 03/26/2021   CREATININE 0.99 03/26/2021   BUN 12 03/26/2021   CO2 26 03/26/2021   TSH 0.89 03/26/2021   PSA 0.54 03/26/2021   HGBA1C 7.7 (H) 03/26/2021  04/15/21 urine culture negative.    IMAGES: CXR 03/26/21: FINDINGS: The heart size and mediastinal contours are within normal limits. Both lungs are clear. The visualized skeletal structures are unremarkable. IMPRESSION: No active cardiopulmonary disease.  MRA Neck 10/08/08: IMPRESSION:  Evidence of minor atherosclerosis of the bilateral ICA bulbs.  Otherwise, negative neck MRA.   MRI/MRA Head 10/06/08: MRI  IMPRESSION:  - Moderate sized acute non hemorrhagic left parietal - occipital lobe  infarct.  - Remote basal ganglia infarcts.  - Moderate white matter type changes as noted above. [See full report] MRA IMPRESSION: - Mild intracranial atherosclerotic type changes as noted above. [See full report]   EKG: 03/26/21: Sinus  Rhythm. Negative T-waves, Possible  Anterolateral  ischemia.  - EKG appears stable when compared to 07/28/16 tracing. T wave inversions less prominent on current tracing.     CV: Cardiac cath 08/03/16:   The left ventricular ejection fraction is 45-50% by visual estimate.   Assessment & Plan: 1. Normal coronary arteries 2. EF ~50%  3. Normal LVEDP  Continue medical management.    Echo 06/18/14: Study Conclusions  - Left ventricle: The cavity size was mildly dilated. Wall    thickness was increased in a pattern of mild LVH. Systolic    function was mildly reduced. The estimated ejection fraction was    in the range of 45% to 50%. Diffuse hypokinesis.  - Atrial septum: No defect or patent foramen ovale was identified.   Comparison: ECHO 10/2008 25-30%  ECHO 02/2009 EF 35-40%  ECHO 04/2013 EF 35-40%    Past Medical History:  Diagnosis Date   Arthritis    Cardiomyopathy    Non-ischemic   CHF (congestive heart failure) (HCC)    Diabetes mellitus without complication (HCC)    HTN (hypertension)    Hyperlipidemia    Hypokalemia    Obesity    OSA (obstructive sleep apnea)    Sleep apnea    had surgery- no cpap   Systolic CHF [facility])     Past Surgical History:  Procedure Laterality Date   avulsion fracture of left hip     CARDIAC CATHETERIZATION  10/08/08   LEFT HEART CATH AND CORONARY ANGIOGRAPHY N/A 08/03/2016   Procedure: Left Heart Cath and Coronary Angiography;  Surgeon: Dolores Patty, MD;  Location: [facility] INVASIVE CV LAB;  Service: Cardiovascular;  Laterality: N/A;   TONSILLECTOMY     UVULOPALATOPHARYNGOPLASTY      MEDICATIONS:  potassium chloride (KLOR-CON M) 10 MEQ tablet   acetaminophen (TYLENOL) 500 MG tablet   aspirin 81 MG tablet   atorvastatin (LIPITOR) 20 MG tablet   carvedilol (COREG) 25 MG tablet   Chlorpheniramine-DM (CORICIDIN HBP COUGH/COLD PO)   felodipine (PLENDIL) 2.5 MG 24 hr tablet   furosemide (LASIX) 40 MG tablet   hydrALAZINE (APRESOLINE) 25 MG tablet   lisinopril (ZESTRIL) 40 MG tablet   metFORMIN (GLUCOPHAGE) 500 MG tablet   potassium chloride (KLOR-CON M) 10 MEQ tablet   spironolactone (ALDACTONE) 25 MG  tablet   tadalafil (CIALIS) 20 MG tablet   No current facility-administered medications for this encounter.    Shonna Chock, PA-C Surgical Short Stay/Anesthesiology [facility] Phone [phone] [facility] Phone [phone] 04/16/2021 5:42 PM

## 2021-04-20 ENCOUNTER — Other Ambulatory Visit (HOSPITAL_COMMUNITY)
Admission: RE | Admit: 2021-04-20 | Discharge: 2021-04-20 | Disposition: A | Discharge status: Home / Self Care | Payer: BC Managed Care – PPO | Source: Ambulatory Visit | Attending: Orthopedic Surgery | Admitting: Orthopedic Surgery

## 2021-04-20 DIAGNOSIS — Z01812 Encounter for preprocedural laboratory examination: Secondary | ICD-10-CM | POA: Diagnosis not present

## 2021-04-20 DIAGNOSIS — Z01818 Encounter for other preprocedural examination: Secondary | ICD-10-CM

## 2021-04-20 DIAGNOSIS — Z20822 Contact with and (suspected) exposure to covid-19: Secondary | ICD-10-CM | POA: Insufficient documentation

## 2021-04-20 LAB — SARS CORONAVIRUS 2 (TAT 6-24 HRS): SARS Coronavirus 2: NEGATIVE

## 2021-04-20 MED ORDER — TRANEXAMIC ACID 1000 MG/10ML IV SOLN
2000.0000 mg | INTRAVENOUS | Status: AC
  Administered 2021-04-21: 2000 mg via TOPICAL
  Filled 2021-04-20: qty 20

## 2021-04-21 ENCOUNTER — Encounter (HOSPITAL_COMMUNITY): Payer: Self-pay | Admitting: Orthopedic Surgery

## 2021-04-21 ENCOUNTER — Observation Stay (HOSPITAL_COMMUNITY)
Admission: RE | Admit: 2021-04-21 | Discharge: 2021-04-23 | Disposition: A | Discharge status: Home / Self Care | Payer: BC Managed Care – PPO | Attending: Orthopedic Surgery | Admitting: Orthopedic Surgery

## 2021-04-21 ENCOUNTER — Encounter (HOSPITAL_COMMUNITY)
Admission: RE | Disposition: A | Discharge status: Home / Self Care | Payer: Self-pay | Source: Home / Self Care | Attending: Orthopedic Surgery

## 2021-04-21 ENCOUNTER — Ambulatory Visit (HOSPITAL_COMMUNITY): Payer: BC Managed Care – PPO | Admitting: Anesthesiology

## 2021-04-21 ENCOUNTER — Ambulatory Visit (HOSPITAL_COMMUNITY): Payer: BC Managed Care – PPO | Admitting: Vascular Surgery

## 2021-04-21 DIAGNOSIS — Z96651 Presence of right artificial knee joint: Secondary | ICD-10-CM

## 2021-04-21 DIAGNOSIS — Z01818 Encounter for other preprocedural examination: Secondary | ICD-10-CM

## 2021-04-21 DIAGNOSIS — I5022 Chronic systolic (congestive) heart failure: Secondary | ICD-10-CM | POA: Diagnosis not present

## 2021-04-21 DIAGNOSIS — I110 Hypertensive heart disease with heart failure: Secondary | ICD-10-CM | POA: Insufficient documentation

## 2021-04-21 DIAGNOSIS — G8918 Other acute postprocedural pain: Secondary | ICD-10-CM | POA: Diagnosis not present

## 2021-04-21 DIAGNOSIS — M1711 Unilateral primary osteoarthritis, right knee: Secondary | ICD-10-CM | POA: Diagnosis not present

## 2021-04-21 DIAGNOSIS — F17290 Nicotine dependence, other tobacco product, uncomplicated: Secondary | ICD-10-CM | POA: Diagnosis not present

## 2021-04-21 DIAGNOSIS — E119 Type 2 diabetes mellitus without complications: Secondary | ICD-10-CM | POA: Diagnosis not present

## 2021-04-21 HISTORY — PX: TOTAL KNEE ARTHROPLASTY: SHX125

## 2021-04-21 LAB — BASIC METABOLIC PANEL
Anion gap: 9 (ref 5–15)
BUN: 10 mg/dL (ref 6–20)
CO2: 25 mmol/L (ref 22–32)
Calcium: 9 mg/dL (ref 8.9–10.3)
Chloride: 102 mmol/L (ref 98–111)
Creatinine, Ser: 0.87 mg/dL (ref 0.61–1.24)
GFR, Estimated: >60 mL/min (ref >60)
Glucose, Bld: 152 mg/dL — ABNORMAL HIGH (ref 70–99)
Potassium: 3.9 mmol/L (ref 3.5–5.1)
Sodium: 136 mmol/L (ref 135–145)

## 2021-04-21 LAB — GLUCOSE, CAPILLARY
Glucose-Capillary: 146 mg/dL — ABNORMAL HIGH (ref 70–99)
Glucose-Capillary: 159 mg/dL — ABNORMAL HIGH (ref 70–99)
Glucose-Capillary: 156 mg/dL — ABNORMAL HIGH (ref 70–99)
Glucose-Capillary: 234 mg/dL — ABNORMAL HIGH (ref 70–99)
Glucose-Capillary: 168 mg/dL — ABNORMAL HIGH (ref 70–99)

## 2021-04-21 SURGERY — ARTHROPLASTY, KNEE, TOTAL
Anesthesia: Monitor Anesthesia Care | Site: Knee | Laterality: Right

## 2021-04-21 MED ORDER — DEXAMETHASONE SODIUM PHOSPHATE 10 MG/ML IJ SOLN
INTRAMUSCULAR | Status: DC | PRN
  Administered 2021-04-21: 4 mg via INTRAVENOUS

## 2021-04-21 MED ORDER — PHENYLEPHRINE 40 MCG/ML (10ML) SYRINGE FOR IV PUSH (FOR BLOOD PRESSURE SUPPORT)
PREFILLED_SYRINGE | INTRAVENOUS | Status: AC
  Filled 2021-04-21: qty 10

## 2021-04-21 MED ORDER — OXYCODONE HCL 5 MG PO TABS
10.0000 mg | ORAL_TABLET | ORAL | Status: AC
  Administered 2021-04-21 – 2021-04-23 (×8): 10 mg via ORAL
  Filled 2021-04-21 (×8): qty 2

## 2021-04-21 MED ORDER — METHOCARBAMOL 1000 MG/10ML IJ SOLN
500.0000 mg | Freq: Four times a day (QID) | INTRAVENOUS | Status: DC | PRN
  Filled 2021-04-21: qty 5

## 2021-04-21 MED ORDER — INSULIN ASPART 100 UNIT/ML IJ SOLN: 0.0000 [IU] | Freq: Every day | INTRAMUSCULAR | Status: DC

## 2021-04-21 MED ORDER — FENTANYL CITRATE (PF) 250 MCG/5ML IJ SOLN
INTRAMUSCULAR | Status: DC | PRN
  Administered 2021-04-21: 50 ug via INTRAVENOUS
  Administered 2021-04-21: 25 ug via INTRAVENOUS
  Administered 2021-04-21: 50 ug via INTRAVENOUS
  Administered 2021-04-21: 25 ug via INTRAVENOUS
  Administered 2021-04-21 (×2): 50 ug via INTRAVENOUS

## 2021-04-21 MED ORDER — LIDOCAINE 2% (20 MG/ML) 5 ML SYRINGE
INTRAMUSCULAR | Status: AC
  Filled 2021-04-21: qty 5

## 2021-04-21 MED ORDER — ACETAMINOPHEN 160 MG/5ML PO SOLN: 1000.0000 mg | Freq: Once | ORAL | Status: DC | PRN

## 2021-04-21 MED ORDER — ONDANSETRON HCL 4 MG/2ML IJ SOLN: 4.0000 mg | Freq: Four times a day (QID) | INTRAMUSCULAR | Status: DC | PRN

## 2021-04-21 MED ORDER — ONDANSETRON HCL 4 MG/2ML IJ SOLN
INTRAMUSCULAR | Status: DC | PRN
  Administered 2021-04-21: 4 mg via INTRAVENOUS

## 2021-04-21 MED ORDER — BUPIVACAINE HCL 0.25 % IJ SOLN
INTRAMUSCULAR | Status: DC | PRN
  Administered 2021-04-21: 10 mL
  Administered 2021-04-21: 20 mL

## 2021-04-21 MED ORDER — MIDAZOLAM HCL 2 MG/2ML IJ SOLN
INTRAMUSCULAR | Status: DC | PRN
  Administered 2021-04-21: 2 mg via INTRAVENOUS

## 2021-04-21 MED ORDER — INSULIN ASPART 100 UNIT/ML IJ SOLN
0.0000 [IU] | Freq: Three times a day (TID) | INTRAMUSCULAR | Status: DC
  Administered 2021-04-21: 5 [IU] via SUBCUTANEOUS
  Administered 2021-04-22 (×2): 2 [IU] via SUBCUTANEOUS
  Administered 2021-04-22 – 2021-04-23 (×2): 3 [IU] via SUBCUTANEOUS
  Administered 2021-04-23: 2 [IU] via SUBCUTANEOUS

## 2021-04-21 MED ORDER — ACETAMINOPHEN 500 MG PO TABS: 1000.0000 mg | ORAL_TABLET | Freq: Once | ORAL | Status: DC | PRN

## 2021-04-21 MED ORDER — DEXAMETHASONE SODIUM PHOSPHATE 10 MG/ML IJ SOLN
INTRAMUSCULAR | Status: AC
  Filled 2021-04-21: qty 1

## 2021-04-21 MED ORDER — VANCOMYCIN HCL 1000 MG IV SOLR
INTRAVENOUS | Status: AC
  Filled 2021-04-21: qty 20

## 2021-04-21 MED ORDER — OXYCODONE HCL 5 MG PO TABS
ORAL_TABLET | ORAL | Status: AC
  Filled 2021-04-21: qty 1

## 2021-04-21 MED ORDER — OXYCODONE HCL 5 MG PO TABS
5.0000 mg | ORAL_TABLET | Freq: Once | ORAL | Status: AC | PRN
  Administered 2021-04-21: 5 mg via ORAL

## 2021-04-21 MED ORDER — ROPIVACAINE HCL 7.5 MG/ML IJ SOLN
INTRAMUSCULAR | Status: DC | PRN
Start: 2021-04-21 — End: 2021-04-21
  Administered 2021-04-21: 20 mL via PERINEURAL

## 2021-04-21 MED ORDER — POVIDONE-IODINE 10 % EX SWAB: 2.0000 "application " | Freq: Once | CUTANEOUS | Status: DC

## 2021-04-21 MED ORDER — BUPIVACAINE LIPOSOME 1.3 % IJ SUSP
INTRAMUSCULAR | Status: DC | PRN
  Administered 2021-04-21: 20 mL

## 2021-04-21 MED ORDER — LACTATED RINGERS IV SOLN: INTRAVENOUS | Status: DC

## 2021-04-21 MED ORDER — MORPHINE SULFATE (PF) 4 MG/ML IV SOLN
INTRAVENOUS | Status: AC
  Filled 2021-04-21: qty 2

## 2021-04-21 MED ORDER — SODIUM CHLORIDE 0.9 % IR SOLN
Status: DC | PRN
  Administered 2021-04-21: 3000 mL

## 2021-04-21 MED ORDER — CHLORHEXIDINE GLUCONATE 0.12 % MT SOLN
OROMUCOSAL | Status: AC
  Administered 2021-04-21: 15 mL via OROMUCOSAL
  Filled 2021-04-21: qty 15

## 2021-04-21 MED ORDER — METHOCARBAMOL 500 MG PO TABS
500.0000 mg | ORAL_TABLET | Freq: Four times a day (QID) | ORAL | Status: DC | PRN
  Administered 2021-04-22 – 2021-04-23 (×5): 500 mg via ORAL
  Filled 2021-04-21 (×5): qty 1

## 2021-04-21 MED ORDER — FENTANYL CITRATE (PF) 100 MCG/2ML IJ SOLN
25.0000 ug | INTRAMUSCULAR | Status: DC | PRN
  Administered 2021-04-21 (×2): 50 ug via INTRAVENOUS

## 2021-04-21 MED ORDER — CLONIDINE HCL (ANALGESIA) 100 MCG/ML EP SOLN
EPIDURAL | Status: AC
  Filled 2021-04-21: qty 10

## 2021-04-21 MED ORDER — BUPIVACAINE HCL (PF) 0.25 % IJ SOLN
INTRAMUSCULAR | Status: AC
  Filled 2021-04-21: qty 30

## 2021-04-21 MED ORDER — PROPOFOL 500 MG/50ML IV EMUL
INTRAVENOUS | Status: DC | PRN
  Administered 2021-04-21: 100 ug/kg/min via INTRAVENOUS

## 2021-04-21 MED ORDER — PHENYLEPHRINE 40 MCG/ML (10ML) SYRINGE FOR IV PUSH (FOR BLOOD PRESSURE SUPPORT)
PREFILLED_SYRINGE | INTRAVENOUS | Status: DC | PRN
  Administered 2021-04-21: 80 ug via INTRAVENOUS

## 2021-04-21 MED ORDER — ACETAMINOPHEN 500 MG PO TABS
1000.0000 mg | ORAL_TABLET | Freq: Four times a day (QID) | ORAL | Status: AC
  Administered 2021-04-21 – 2021-04-22 (×4): 1000 mg via ORAL
  Filled 2021-04-21 (×4): qty 2

## 2021-04-21 MED ORDER — FENTANYL CITRATE (PF) 100 MCG/2ML IJ SOLN
INTRAMUSCULAR | Status: AC
  Filled 2021-04-21: qty 2

## 2021-04-21 MED ORDER — PROPOFOL 10 MG/ML IV BOLUS
INTRAVENOUS | Status: DC | PRN
  Administered 2021-04-21: 30 mg via INTRAVENOUS
  Administered 2021-04-21 (×2): 20 mg via INTRAVENOUS

## 2021-04-21 MED ORDER — LACTATED RINGERS IV SOLN: INTRAVENOUS | Status: DC | PRN

## 2021-04-21 MED ORDER — DOCUSATE SODIUM 100 MG PO CAPS
100.0000 mg | ORAL_CAPSULE | Freq: Two times a day (BID) | ORAL | Status: DC
  Administered 2021-04-21 – 2021-04-23 (×5): 100 mg via ORAL
  Filled 2021-04-21 (×5): qty 1

## 2021-04-21 MED ORDER — 0.9 % SODIUM CHLORIDE (POUR BTL) OPTIME
TOPICAL | Status: DC | PRN
  Administered 2021-04-21: 5000 mL

## 2021-04-21 MED ORDER — MENTHOL 3 MG MT LOZG: 1.0000 | LOZENGE | OROMUCOSAL | Status: DC | PRN

## 2021-04-21 MED ORDER — HYDROMORPHONE HCL 1 MG/ML IJ SOLN
0.5000 mg | INTRAMUSCULAR | Status: DC | PRN
  Administered 2021-04-21 – 2021-04-23 (×7): 0.5 mg via INTRAVENOUS
  Filled 2021-04-21 (×7): qty 0.5

## 2021-04-21 MED ORDER — FENTANYL CITRATE (PF) 250 MCG/5ML IJ SOLN
INTRAMUSCULAR | Status: AC
  Filled 2021-04-21: qty 5

## 2021-04-21 MED ORDER — IRRISEPT - 450ML BOTTLE WITH 0.05% CHG IN STERILE WATER, USP 99.95% OPTIME
TOPICAL | Status: DC | PRN
  Administered 2021-04-21: 450 mL

## 2021-04-21 MED ORDER — PROPOFOL 10 MG/ML IV BOLUS
INTRAVENOUS | Status: AC
  Filled 2021-04-21: qty 20

## 2021-04-21 MED ORDER — ASPIRIN 81 MG PO CHEW
81.0000 mg | CHEWABLE_TABLET | Freq: Two times a day (BID) | ORAL | Status: DC
  Administered 2021-04-21 – 2021-04-23 (×5): 81 mg via ORAL
  Filled 2021-04-21 (×5): qty 1

## 2021-04-21 MED ORDER — INSULIN ASPART 100 UNIT/ML IJ SOLN
4.0000 [IU] | Freq: Three times a day (TID) | INTRAMUSCULAR | Status: DC
  Administered 2021-04-21 – 2021-04-23 (×6): 4 [IU] via SUBCUTANEOUS

## 2021-04-21 MED ORDER — CEFAZOLIN SODIUM-DEXTROSE 2-4 GM/100ML-% IV SOLN
2.0000 g | INTRAVENOUS | Status: AC
  Administered 2021-04-21: 2 g via INTRAVENOUS
  Filled 2021-04-21: qty 100

## 2021-04-21 MED ORDER — PHENYLEPHRINE HCL-NACL 20-0.9 MG/250ML-% IV SOLN
INTRAVENOUS | Status: DC | PRN
  Administered 2021-04-21: 25 ug/min via INTRAVENOUS

## 2021-04-21 MED ORDER — MORPHINE SULFATE (PF) 4 MG/ML IV SOLN
INTRAVENOUS | Status: DC | PRN
  Administered 2021-04-21: 8 mg via INTRAMUSCULAR

## 2021-04-21 MED ORDER — METOCLOPRAMIDE HCL 5 MG/ML IJ SOLN: 5.0000 mg | Freq: Three times a day (TID) | INTRAMUSCULAR | Status: DC | PRN

## 2021-04-21 MED ORDER — VANCOMYCIN HCL 1000 MG IV SOLR
INTRAVENOUS | Status: DC | PRN
  Administered 2021-04-21: 1000 mg

## 2021-04-21 MED ORDER — BUPIVACAINE LIPOSOME 1.3 % IJ SUSP
INTRAMUSCULAR | Status: AC
  Filled 2021-04-21: qty 20

## 2021-04-21 MED ORDER — METOCLOPRAMIDE HCL 5 MG PO TABS: 5.0000 mg | ORAL_TABLET | Freq: Three times a day (TID) | ORAL | Status: DC | PRN

## 2021-04-21 MED ORDER — CHLORHEXIDINE GLUCONATE 0.12 % MT SOLN: 15.0000 mL | OROMUCOSAL | Status: AC

## 2021-04-21 MED ORDER — MIDAZOLAM HCL 2 MG/2ML IJ SOLN
INTRAMUSCULAR | Status: AC
  Filled 2021-04-21: qty 2

## 2021-04-21 MED ORDER — ONDANSETRON HCL 4 MG/2ML IJ SOLN
INTRAMUSCULAR | Status: AC
  Filled 2021-04-21: qty 2

## 2021-04-21 MED ORDER — OXYCODONE HCL 5 MG PO TABS
5.0000 mg | ORAL_TABLET | ORAL | Status: DC | PRN
  Administered 2021-04-21: 10 mg via ORAL
  Filled 2021-04-21: qty 2

## 2021-04-21 MED ORDER — PHENOL 1.4 % MT LIQD: 1.0000 | OROMUCOSAL | Status: DC | PRN

## 2021-04-21 MED ORDER — ONDANSETRON HCL 4 MG PO TABS: 4.0000 mg | ORAL_TABLET | Freq: Four times a day (QID) | ORAL | Status: DC | PRN

## 2021-04-21 MED ORDER — BUPIVACAINE IN DEXTROSE 0.75-8.25 % IT SOLN
INTRATHECAL | Status: DC | PRN
  Administered 2021-04-21: 2 mL via INTRATHECAL

## 2021-04-21 MED ORDER — POVIDONE-IODINE 7.5 % EX SOLN
Freq: Once | CUTANEOUS | Status: DC
  Filled 2021-04-21: qty 118

## 2021-04-21 MED ORDER — SODIUM CHLORIDE 0.9% FLUSH
INTRAVENOUS | Status: DC | PRN
Start: 2021-04-21 — End: 2021-04-21
  Administered 2021-04-21: 20 mL

## 2021-04-21 MED ORDER — CEFAZOLIN SODIUM-DEXTROSE 2-4 GM/100ML-% IV SOLN
2.0000 g | Freq: Three times a day (TID) | INTRAVENOUS | Status: AC
  Administered 2021-04-21 (×2): 2 g via INTRAVENOUS
  Filled 2021-04-21 (×2): qty 100

## 2021-04-21 MED ORDER — ACETAMINOPHEN 10 MG/ML IV SOLN
1000.0000 mg | Freq: Once | INTRAVENOUS | Status: DC | PRN
  Administered 2021-04-21: 1000 mg via INTRAVENOUS

## 2021-04-21 MED ORDER — OXYCODONE HCL 5 MG/5ML PO SOLN: 5.0000 mg | Freq: Once | ORAL | Status: AC | PRN

## 2021-04-21 MED ORDER — CLONIDINE HCL (ANALGESIA) 100 MCG/ML EP SOLN
EPIDURAL | Status: DC | PRN
  Administered 2021-04-21: 1 mL

## 2021-04-21 MED ORDER — TRANEXAMIC ACID-NACL 1000-0.7 MG/100ML-% IV SOLN
1000.0000 mg | INTRAVENOUS | Status: AC
  Administered 2021-04-21: 1000 mg via INTRAVENOUS
  Filled 2021-04-21: qty 100

## 2021-04-21 MED ORDER — ACETAMINOPHEN 10 MG/ML IV SOLN
INTRAVENOUS | Status: AC
  Filled 2021-04-21: qty 100

## 2021-04-21 MED ORDER — ACETAMINOPHEN 325 MG PO TABS
325.0000 mg | ORAL_TABLET | Freq: Four times a day (QID) | ORAL | Status: DC | PRN
  Administered 2021-04-22 – 2021-04-23 (×4): 650 mg via ORAL
  Filled 2021-04-21 (×4): qty 2

## 2021-04-21 SURGICAL SUPPLY — 74 items
BAG COUNTER SPONGE SURGICOUNT (BAG) ×2 IMPLANT
BAG DECANTER FOR FLEXI CONT (MISCELLANEOUS) ×2 IMPLANT
BANDAGE ESMARK 6X9 LF (GAUZE/BANDAGES/DRESSINGS) ×1 IMPLANT
BLADE SAG 18X100X1.27 (BLADE) ×2 IMPLANT
BLADE SAGITTAL (BLADE) ×2
BLADE SAW THK.89X75X18XSGTL (BLADE) ×1 IMPLANT
BNDG ELASTIC 6X15 VLCR STRL LF (GAUZE/BANDAGES/DRESSINGS) ×1 IMPLANT
BNDG ESMARK 6X9 LF (GAUZE/BANDAGES/DRESSINGS) ×2
CNTNR URN SCR LID CUP LEK RST (MISCELLANEOUS) ×1 IMPLANT
CONT SPEC 4OZ STRL OR WHT (MISCELLANEOUS) ×2
COOLER ICEMAN CLASSIC (MISCELLANEOUS) ×1 IMPLANT
COVER SURGICAL LIGHT HANDLE (MISCELLANEOUS) ×2 IMPLANT
CUFF TOURN SGL QUICK 34 (TOURNIQUET CUFF) ×2
CUFF TOURN SGL QUICK 42 (TOURNIQUET CUFF) IMPLANT
CUFF TRNQT CYL 34X4.125X (TOURNIQUET CUFF) ×1 IMPLANT
DRAPE INCISE IOBAN 66X45 STRL (DRAPES) IMPLANT
DRAPE ORTHO SPLIT 77X108 STRL (DRAPES) ×6
DRAPE SURG ORHT 6 SPLT 77X108 (DRAPES) ×3 IMPLANT
DRAPE U-SHAPE 47X51 STRL (DRAPES) ×2 IMPLANT
DRSG AQUACEL AG ADV 3.5X14 (GAUZE/BANDAGES/DRESSINGS) ×1 IMPLANT
DURAPREP 26ML APPLICATOR (WOUND CARE) ×4 IMPLANT
ELECT CAUTERY BLADE 6.4 (BLADE) ×2 IMPLANT
ELECT REM PT RETURN 9FT ADLT (ELECTROSURGICAL) ×2
ELECTRODE REM PT RTRN 9FT ADLT (ELECTROSURGICAL) ×1 IMPLANT
GAUZE SPONGE 4X4 12PLY STRL (GAUZE/BANDAGES/DRESSINGS) ×1 IMPLANT
GLOVE SRG 8 PF TXTR STRL LF DI (GLOVE) ×1 IMPLANT
GLOVE SURG ENC MOIS LTX SZ6.5 (GLOVE) ×6 IMPLANT
GLOVE SURG LTX SZ8 (GLOVE) ×2 IMPLANT
GLOVE SURG UNDER POLY LF SZ7 (GLOVE) ×2 IMPLANT
GLOVE SURG UNDER POLY LF SZ8 (GLOVE) ×2
GOWN STRL REUS W/ TWL LRG LVL3 (GOWN DISPOSABLE) ×3 IMPLANT
GOWN STRL REUS W/TWL LRG LVL3 (GOWN DISPOSABLE) ×6
HANDPIECE INTERPULSE COAX TIP (DISPOSABLE) ×2
HOOD PEEL AWAY FLYTE STAYCOOL (MISCELLANEOUS) ×7 IMPLANT
IMMOBILIZER KNEE 20 (SOFTGOODS)
IMMOBILIZER KNEE 20 THIGH 36 (SOFTGOODS) IMPLANT
IMMOBILIZER KNEE 22 UNIV (SOFTGOODS) ×1 IMPLANT
INSERT TRIATH CS X3 11 (Insert) ×1 IMPLANT
KIT BASIN OR (CUSTOM PROCEDURE TRAY) ×2 IMPLANT
KIT TURNOVER KIT B (KITS) ×2 IMPLANT
KNEE FEMORAL COMP RT RETAIN (Knees) ×1 IMPLANT
KNEE TIBIAL COMPONENT SZ5 (Knees) ×1 IMPLANT
MANIFOLD NEPTUNE II (INSTRUMENTS) ×2 IMPLANT
NDL 18GX1X1/2 (RX/OR ONLY) (NEEDLE) IMPLANT
NDL SPNL 18GX3.5 QUINCKE PK (NEEDLE) ×1 IMPLANT
NEEDLE 18GX1X1/2 (RX/OR ONLY) (NEEDLE) ×4 IMPLANT
NEEDLE 22X1 1/2 (OR ONLY) (NEEDLE) ×4 IMPLANT
NEEDLE SPNL 18GX3.5 QUINCKE PK (NEEDLE) ×2 IMPLANT
NS IRRIG 1000ML POUR BTL (IV SOLUTION) ×3 IMPLANT
PACK TOTAL JOINT (CUSTOM PROCEDURE TRAY) ×2 IMPLANT
PAD ARMBOARD 7.5X6 YLW CONV (MISCELLANEOUS) ×4 IMPLANT
PAD CAST 4YDX4 CTTN HI CHSV (CAST SUPPLIES) IMPLANT
PADDING CAST COTTON 4X4 STRL (CAST SUPPLIES) ×2
PADDING CAST COTTON 6X4 STRL (CAST SUPPLIES) ×2 IMPLANT
PATELLA ASYMMETRIC 38X11 KNEE (Orthopedic Implant) ×1 IMPLANT
SET HNDPC FAN SPRY TIP SCT (DISPOSABLE) ×1 IMPLANT
STRIP CLOSURE SKIN 1/2X4 (GAUZE/BANDAGES/DRESSINGS) ×2 IMPLANT
SUCTION FRAZIER HANDLE 10FR (MISCELLANEOUS) ×2
SUCTION TUBE FRAZIER 10FR DISP (MISCELLANEOUS) ×1 IMPLANT
SUT MNCRL AB 3-0 PS2 18 (SUTURE) ×2 IMPLANT
SUT MON AB 4-0 PS1 27 (SUTURE) ×1 IMPLANT
SUT VIC AB 0 CT1 27 (SUTURE) ×12
SUT VIC AB 0 CT1 27XBRD ANBCTR (SUTURE) ×3 IMPLANT
SUT VIC AB 1 CT1 27 (SUTURE) ×6
SUT VIC AB 1 CT1 27XBRD ANBCTR (SUTURE) IMPLANT
SUT VIC AB 1 CT1 36 (SUTURE) ×7 IMPLANT
SUT VIC AB 2-0 CT1 27 (SUTURE) ×6
SUT VIC AB 2-0 CT1 TAPERPNT 27 (SUTURE) ×4 IMPLANT
SYR 30ML LL (SYRINGE) ×6 IMPLANT
SYR TB 1ML LUER SLIP (SYRINGE) ×2 IMPLANT
TOWEL GREEN STERILE (TOWEL DISPOSABLE) ×4 IMPLANT
TOWEL GREEN STERILE FF (TOWEL DISPOSABLE) ×4 IMPLANT
TRAY CATH 16FR W/PLASTIC CATH (SET/KITS/TRAYS/PACK) IMPLANT
YANKAUER SUCT BULB TIP NO VENT (SUCTIONS) ×2 IMPLANT

## 2021-04-21 NOTE — Transfer of Care (Signed)
Immediate Anesthesia Transfer of Care Note  Patient: Caleb Garcia  Procedure(s) Performed: RIGHT TOTAL KNEE ARTHROPLASTY (Right: Knee)  Patient Location: PACU  Anesthesia Type:MAC and Spinal  Level of Consciousness: awake, oriented and patient cooperative  Airway & Oxygen Therapy: Patient Spontanous Breathing and Patient connected to face mask oxygen  Post-op Assessment: Report given to RN and Post -op Vital signs reviewed and stable  Post vital signs: Reviewed  Last Vitals:  Vitals Value Taken Time  BP    Temp    Pulse    Resp    SpO2      Last Pain:  Vitals:   04/21/21 0609  TempSrc: Oral  PainSc:       Patients Stated Pain Goal: 1 (50/35/46 5681)  Complications: No notable events documented.

## 2021-04-21 NOTE — Plan of Care (Signed)

## 2021-04-21 NOTE — Anesthesia Procedure Notes (Signed)
Anesthesia Regional Block: Adductor canal block   Pre-Anesthetic Checklist: , timeout performed,  Correct Patient, Correct Site, Correct Laterality,  Correct Procedure, Correct Position, site marked,  Risks and benefits discussed,  Surgical consent,  Pre-op evaluation,  At surgeon's request and post-op pain management  Laterality: Right and Lower  Prep: chloraprep       Needles:  Injection technique: Single-shot      Needle Length: 9cm  Needle Gauge: 21     Additional Needles: Arrow StimuQuik ECHO Echogenic Stimulating PNB Needle  Procedures:,,,, ultrasound used (permanent image in chart),,    Narrative:  Start time: 04/21/2021 7:18 AM End time: 04/21/2021 7:26 AM Injection made incrementally with aspirations every 5 mL.  Performed by: Personally  Anesthesiologist: Val Eagle, MD

## 2021-04-21 NOTE — Progress Notes (Signed)
Orthopedic Tech Progress Note Patient Details:  Caleb Garcia 13-Sep-1965 [id number]  CPM Right Knee CPM Right Knee: On Right Knee Flexion (Degrees): 10 Right Knee Extension (Degrees): 40  Post Interventions Patient Tolerated: Well Instructions Provided: Care of device Ortho Devices Type of Ortho Device: Bone foam zero knee, CPM padding Ortho Device/Splint Location: RLE Ortho Device/Splint Interventions: Ordered   Post Interventions Patient Tolerated: Well Instructions Provided: Care of device  Donald Pore 04/21/2021, 11:28 AM

## 2021-04-21 NOTE — H&P (Signed)
TOTAL KNEE ADMISSION H&P  Patient is being admitted for right total knee arthroplasty.  Subjective:  Chief Complaint:right knee pain.  HPI: Caleb Garcia, 56 y.o. male, has a history of pain and functional disability in the right knee due to arthritis and has failed non-surgical conservative treatments for greater than 12 weeks to includeNSAID's and/or analgesics, corticosteriod injections, flexibility and strengthening excercises, and activity modification.  Onset of symptoms was gradual, starting >10 years ago with gradually worsening course since that time. The patient noted no past surgery on the right knee(s).  Patient currently rates pain in the right knee(s) at 9 out of 10 with activity. Patient has night pain, worsening of pain with activity and weight bearing, pain that interferes with activities of daily living, pain with passive range of motion, crepitus, and joint swelling.  Patient has evidence of subchondral sclerosis and joint space narrowing by imaging studies. This patient has had  bilateral knee pain with the right knee becoming worse over the past several months.  He did have a cortisone injection 3 months ago.  No personal or family history of DVT or pulmonary embolism. . There is no active infection.  Patient Active Problem List   Diagnosis Date Noted   Chest discomfort 07/28/2016   Controlled type 2 diabetes mellitus without complication ([place]) 0000000   Bilateral shoulder pain 10/31/2013   Erectile dysfunction 04/18/2013   Mixed hyperlipidemia 11/26/2009   LIMB PAIN 07/23/2009   GERD 04/29/2009   Obesity 10/22/2008   OBSTRUCTIVE SLEEP APNEA 10/22/2008   Essential hypertension 10/22/2008   CARDIOMYOPATHY 10/22/2008   CONGESTIVE HEART FAILURE, MILD [id number]   Chronic systolic heart failure ([place]) 10/16/2008   Past Medical History:  Diagnosis Date   Arthritis    Cardiomyopathy    Non-ischemic   CHF (congestive heart failure) (HCC)    Diabetes mellitus without  complication (HCC)    HTN (hypertension)    Hyperlipidemia    Hypokalemia    Obesity    OSA (obstructive sleep apnea)    Sleep apnea    had surgery- no cpap   Stroke ([place]) 99991111   Systolic CHF ([place])     Past Surgical History:  Procedure Laterality Date   avulsion fracture of left hip     CARDIAC CATHETERIZATION  10/08/08   LEFT HEART CATH AND CORONARY ANGIOGRAPHY N/A 08/03/2016   Procedure: Left Heart Cath and Coronary Angiography;  Surgeon: Jolaine Artist, MD;  Location: [facility] CV LAB;  Service: Cardiovascular;  Laterality: N/A;   TONSILLECTOMY     UVULOPALATOPHARYNGOPLASTY      Current Facility-Administered Medications  Medication Dose Route Frequency Provider Last Rate Last Admin   ceFAZolin (ANCEF) IVPB 2g/100 mL premix  2 g Intravenous On Call to OR Magnant, Gerrianne Scale, PA-C       lactated ringers infusion   Intravenous Continuous Oleta Mouse, MD       povidone-iodine (BETADINE) 7.5 % scrub   Topical Once Magnant, Charles L, PA-C       povidone-iodine 10 % swab 2 application  2 application Topical Once Magnant, Charles L, PA-C       povidone-iodine 10 % swab 2 application  2 application Topical Once Magnant, Charles L, PA-C       tranexamic acid (CYKLOKAPRON) 2,000 mg in sodium chloride 0.9 % 50 mL Topical Application  [id number] mg Topical To OR Meredith Pel, MD       tranexamic acid (CYKLOKAPRON) IVPB 1,000 mg  1,000 mg Intravenous To  OR Magnant, Charles L, PA-C       Allergies  Allergen Reactions   Other     Almonds-caused swelling in hands   Shrimp [Shellfish Allergy] Swelling    Social History   Tobacco Use   Smoking status: Some Days    Types: Cigars   Smokeless tobacco: Never  Substance Use Topics   Alcohol use: No    Family History  Problem Relation Age of Onset   Diabetes Mother    Hypertension Mother    Heart disease Mother    Cancer Father        Throat cancer   Stroke Father    Stroke Brother 65   Prostate cancer Neg Hx    Colon  cancer Neg Hx    Colon polyps Neg Hx    Esophageal cancer Neg Hx    Rectal cancer Neg Hx    Stomach cancer Neg Hx      Review of Systems  Musculoskeletal:  Positive for arthralgias.  All other systems reviewed and are negative.  Objective:  Physical Exam Vitals reviewed.  HENT:     Head: Normocephalic.     Nose: Nose normal.     Mouth/Throat:     Mouth: Mucous membranes are moist.  Eyes:     Pupils: Pupils are equal, round, and reactive to light.  Cardiovascular:     Rate and Rhythm: Normal rate.     Pulses: Normal pulses.  Pulmonary:     Effort: Pulmonary effort is normal.  Abdominal:     General: Abdomen is flat.  Musculoskeletal:     Cervical back: Normal range of motion.  Skin:    General: Skin is warm.     Capillary Refill: Capillary refill takes less than 2 seconds.  Neurological:     General: No focal deficit present.     Mental Status: He is alert.  Psychiatric:        Mood and Affect: Mood normal.  Right knee range of motion is 7-100.  Collaterals are stable.  Extensor mechanism intact intact.  No masses lymphadenopathy or skin changes noted in that right knee region.  Pedal pulses palpable.  Vital signs in last 24 hours: Temp:  [98.5 F (36.9 C)] 98.5 F (36.9 C) (01/17 0609) Pulse Rate:  [74] 74 (01/17 0609) Resp:  [20] 20 (01/17 0609) BP: (172)/(97) 172/97 (01/17 0609) SpO2:  [98 %] 98 % (01/17 0609) Weight:  [109.3 kg-109.5 kg] 109.3 kg (01/17 0609)  Labs:   Estimated body mass index is 34.58 kg/m as calculated from the following:   Height as of this encounter: 5\' 10"  (1.778 m).   Weight as of this encounter: 109.3 kg.   Imaging Review Plain radiographs demonstrate severe degenerative joint disease of the right knee(s). The overall alignment ismild varus. The bone quality appears to be good for age and reported activity level.      Assessment/Plan:  End stage arthritis, right knee   The patient history, physical examination, clinical  judgment of the provider and imaging studies are consistent with end stage degenerative joint disease of the right knee(s) and total knee arthroplasty is deemed medically necessary. The treatment options including medical management, injection therapy arthroscopy and arthroplasty were discussed at length. The risks and benefits of total knee arthroplasty were presented and reviewed. The risks due to aseptic loosening, infection, stiffness, patella tracking problems, thromboembolic complications and other imponderables were discussed. The patient acknowledged the explanation, agreed to proceed with the plan and  consent was signed. Patient is being admitted for inpatient treatment for surgery, pain control, PT, OT, prophylactic antibiotics, VTE prophylaxis, progressive ambulation and ADL's and discharge planning. The patient is planning to be discharged home with home health services     Patient's anticipated LOS is less than 2 midnights, meeting these requirements: - Younger than 8 - Lives within 1 hour of care - Has a competent adult at home to recover with post-op recover - NO history of  - Chronic pain requiring opiods  - Diabetes  - Coronary Artery Disease  - Heart failure  - Heart attack  - Stroke  - DVT/VTE  - Cardiac arrhythmia  - Respiratory Failure/COPD  - Renal failure  - Anemia  - Advanced Liver disease

## 2021-04-21 NOTE — Anesthesia Procedure Notes (Signed)
Procedure Name: MAC Date/Time: 04/21/2021 7:35 AM Performed by: Jenne Campus, CRNA Pre-anesthesia Checklist: Patient identified, Suction available and Patient being monitored Oxygen Delivery Method: Simple face mask

## 2021-04-21 NOTE — Progress Notes (Signed)
Patient stable.  Pain controlled. Patient has been up walking in the room Plan at this time is to do pain medicine on a scheduled basis every 4 hours. Continue physical therapy tomorrow with anticipated discharge tomorrow afternoon

## 2021-04-21 NOTE — Evaluation (Signed)
Physical Therapy Evaluation Patient Details Name: Caleb Garcia MRN: [id number] DOB: Jul 30, 1965 Today's Date: 04/21/2021  History of Present Illness  Pt is a 55 y/o male s/p R TKA. PMH includes HTN, DM, CVA, CHF.  Clinical Impression  Pt is s/p surgery above with deficits below. Pt requiring min guard to min A for transfers and gait in the room secondary to RW. Reviewed knee precautions with pt. Will continue to follow acutely.        Recommendations for follow up therapy are one component of a multi-disciplinary discharge planning process, led by the attending physician.  Recommendations may be updated based on patient status, additional functional criteria and insurance authorization.  Follow Up Recommendations Follow physician's recommendations for discharge plan and follow up therapies    Assistance Recommended at Discharge Frequent or constant Supervision/Assistance  Patient can return home with the following  A little help with walking and/or transfers;A little help with bathing/dressing/bathroom    Equipment Recommendations None recommended by PT  Recommendations for Other Services       Functional Status Assessment Patient has had a recent decline in their functional status and demonstrates the ability to make significant improvements in function in a reasonable and predictable amount of time.     Precautions / Restrictions Precautions Precautions: Knee Precaution Booklet Issued: No Precaution Comments: Verbally reviewed knee precautions with pt. Restrictions Weight Bearing Restrictions: Yes RLE Weight Bearing: Weight bearing as tolerated      Mobility  Bed Mobility Overal bed mobility: Needs Assistance Bed Mobility: Supine to Sit, Sit to Supine     Supine to sit: Min assist Sit to supine: Min assist   General bed mobility comments: Min A for RLE management    Transfers Overall transfer level: Needs assistance Equipment used: Rolling walker (2  wheels) Transfers: Sit to/from Stand Sit to Stand: Min guard           General transfer comment: Min guard for safety.    Ambulation/Gait Ambulation/Gait assistance: Min guard Gait Distance (Feet): 15 Feet Assistive device: Rolling walker (2 wheels) Gait Pattern/deviations: Step-to pattern, Decreased step length - right, Decreased step length - left, Antalgic Gait velocity: Decreased     General Gait Details: Slow, cautious gait. Min guard for safety. Distance limited to within the room secondary to pain.  Stairs            Wheelchair Mobility    Modified Rankin (Stroke Patients Only)       Balance Overall balance assessment: Needs assistance Sitting-balance support: No upper extremity supported, Feet supported Sitting balance-Leahy Scale: Fair     Standing balance support: Bilateral upper extremity supported Standing balance-Leahy Scale: Poor Standing balance comment: Reliant on BUE support                             Pertinent Vitals/Pain Pain Assessment Pain Assessment: 0-10 Pain Score: 9  Pain Location: R knee Pain Descriptors / Indicators: Grimacing, Guarding Pain Intervention(s): Limited activity within patient's tolerance, Monitored during session, Repositioned    Home Living Family/patient expects to be discharged to:: Private residence Living Arrangements: Alone Available Help at Discharge: Family;Friend(s);Available 24 hours/day Type of Home: House Home Access: Stairs to enter Entrance Stairs-Rails: Right;Left;Can reach both Entrance Stairs-Number of Steps: 2   Home Layout: One level Home Equipment: Conservation officer, nature (2 wheels);Other (comment) (CPM)      Prior Function Prior Level of Function : Independent/Modified Independent  Hand Dominance        Extremity/Trunk Assessment   Upper Extremity Assessment Upper Extremity Assessment: Overall WFL for tasks assessed    Lower Extremity  Assessment Lower Extremity Assessment: RLE deficits/detail RLE Deficits / Details: Deficits consistent with post op pain and weakness.    Cervical / Trunk Assessment Cervical / Trunk Assessment: Normal  Communication   Communication: No difficulties  Cognition Arousal/Alertness: Awake/alert Behavior During Therapy: WFL for tasks assessed/performed Overall Cognitive Status: Within Functional Limits for tasks assessed                                          General Comments      Exercises     Assessment/Plan    PT Assessment Patient needs continued PT services  PT Problem List Decreased strength;Decreased range of motion;Decreased activity tolerance;Decreased mobility;Decreased balance;Decreased knowledge of use of DME;Decreased knowledge of precautions;Pain       PT Treatment Interventions DME instruction;Gait training;Stair training;Functional mobility training;Therapeutic activities;Therapeutic exercise;Balance training;Patient/family education    PT Goals (Current goals can be found in the Care Plan section)  Acute Rehab PT Goals Patient Stated Goal: to go home PT Goal Formulation: With patient Time For Goal Achievement: 05/05/21 Potential to Achieve Goals: Good    Frequency 7X/week     Co-evaluation               AM-PAC PT "6 Clicks" Mobility  Outcome Measure Help needed turning from your back to your side while in a flat bed without using bedrails?: A Little Help needed moving from lying on your back to sitting on the side of a flat bed without using bedrails?: A Little Help needed moving to and from a bed to a chair (including a wheelchair)?: A Little Help needed standing up from a chair using your arms (e.g., wheelchair or bedside chair)?: A Little Help needed to walk in hospital room?: A Little Help needed climbing 3-5 steps with a railing? : A Lot 6 Click Score: 17    End of Session Equipment Utilized During Treatment: Gait  belt Activity Tolerance: Patient tolerated treatment well Patient left: in bed;with call bell/phone within reach Nurse Communication: Mobility status PT Visit Diagnosis: Pain;Other abnormalities of gait and mobility (R26.89);Difficulty in walking, not elsewhere classified (R26.2) Pain - Right/Left: Right Pain - part of body: Knee    Time: [id number] PT Time Calculation (min) (ACUTE ONLY): 28 min   Charges:   PT Evaluation $PT Eval Low Complexity: 1 Low PT Treatments $Gait Training: 8-22 mins        Lou Miner, DPT  Acute Rehabilitation Services  Pager: [phone] Office: [phone]   Rudean Hitt 04/21/2021, 4:13 PM

## 2021-04-21 NOTE — Progress Notes (Signed)
Patient arrived to unit at 1430. VSS. CPM machine one. Drsg intact. Ice man on. C/o pain, given IV PRN medication. Call bell within reach. Bed alarm on.

## 2021-04-21 NOTE — Op Note (Signed)
NAME: Caleb Garcia, LIVING MEDICAL RECORD NO: [id number] ACCOUNT NO: [id number] DATE OF BIRTH: 06-Mar-1966 FACILITY: [facility] LOCATION: [facility] PHYSICIAN: Yetta Barre. Marlou Sa, MD  Operative Report   DATE OF PROCEDURE: 04/21/2021  PREOPERATIVE DIAGNOSIS:  Right knee arthritis.  POSTOPERATIVE DIAGNOSIS:  Right knee arthritis.  PROCEDURE:  Right total knee replacement using Stryker press-fit components, size 4 femur, 5 tibia press-fit posterior cruciate retaining with 11 mm deep-dish polyethylene insert, 38 mm 3-peg press-fit patella.  SURGEON:  Meredith Pel, MD  ASSISTANT:  Annie Main, PA  INDICATIONS:  The patient is a 56 year old patient with end-stage right knee arthritis who presents for operative management after explanation of risks and benefits.  DESCRIPTION OF PROCEDURE:  The patient was brought to the operating room where general endotracheal anesthesia was induced.  Preoperative antibiotics were administered.  Timeout was called.  The right leg was prescrubbed with alcohol and Betadine,  allowed to air dry, prepped with DuraPrep solution, and draped in a sterile manner.  [name] was used to cover the operative field.  A timeout was called.  Leg was elevated and exsanguinated with the Esmarch wrap. Tourniquet was inflated to 300 mmHg.   Anterior approach to the knee was made.  Skin and subcutaneous tissue were sharply divided.  IrriSept solution was utilized at this time. Medial parapatellar arthrotomy was made and marked with a #1 Vicryl suture.  The IrriSept solution also utilized in  the joint at this time.  Lateral patellofemoral ligament was released.  Fat pad was partially excised.  The patient had preoperative varus deformity, and thus some medial soft tissue dissection was performed on the tibial side proximally.  Soft tissue  was removed from the anterior distal femur.  Patella was everted. Knee was flexed.  Intramedullary alignment was then used to cut the tibia initially 2 mm  and then 4 mm total off the most affected medial side.  This gave a cut, which was perpendicular to  the mechanical axis with some slight posterior tilt.  The femur was then cut intramedullary in 5 degrees of valgus 10 mm cut.  This allowed a 9 mm spacer to fit well along with 11 mm spacer in full extension with good alignment and symmetric extension  gap.  Next, the femur was sized to a size 4.  Tibia was sized to a size 5.  Anterior, posterior, and chamfer cuts were then made in 3 degrees of external rotation.  Tibial tray was placed in the appropriate rotation.  Femoral trial and then the tibial  trial were performed with both 9 mm spacer and 11 mm spacer.  The 11 mm spacer with trial implants in position allowed for about 2-3 degrees of hyperextension and good stability to varus and valgus stress at 0, 30, and 90 degrees.  The patella was then  cut down from 26 to 16 mm and a 3-peg patellar trial was placed.  With trial components in position, the patient had full extension, full flexion with no liftoff, and excellent patellar tracking using no thumbs technique.  This was with the 11 mm spacer.   Trial components were removed.  The tibial was keel punched and final preparation was made.  Next, the capsule was anesthetized using a combination of Marcaine, Exparel, and saline.  Tranexamic acid sponge was then allowed to sit within the incision  for 3 minutes.  Next, this was removed, and vancomycin powder was placed into the tibial canal.  IrriSept solution was utilized.  The components were tapped  into position with excellent press fit obtained.  Same stability parameters were maintained with  full extension, very good alignment, full flexion, and no liftoff with excellent patellar tracking.  Tourniquet was released at this time.  Three liters of pouring irrigation solution was utilized at this time.  It should be noted that prior to placement  of the tranexamic acid into the incision before implantation  of the components we did irrigate with 3 liters of pulsatile irrigating solution.  Following implantation, pouring solution was utilized.  The arthrotomy was closed nearly completely over a  bolster and then IrriSept solution was placed through the proximal part of the arthrotomy along with the rest of the vancomycin powder.  Then, the arthrotomy was closed completely using #1 Vicryl suture.  Next, 0 Vicryl suture, 2-0 Vicryl suture, and 3-0  Monocryl with Steri-Strips applied.  Solution of Marcaine, morphine, clonidine was injected into the knee joint for postoperative pain relief.  Next, Aquacel followed by Ace wrap, Iceman pack, and then knee immobilizer placed.  The patient tolerated the  procedure well without immediate complication and transferred to the recovery room in stable condition.  [name]'s assistance was required for opening, closing, mobilization of tissue.  His assistance was a medical necessity.  [TIME: 04:58]   ROH D: 04/21/2021 11:10:03 am T: 04/21/2021 12:49:00 pm  JOB: [id number]

## 2021-04-21 NOTE — Anesthesia Procedure Notes (Signed)
Spinal  Patient location during procedure: OR Start time: 04/21/2021 7:36 AM End time: 04/21/2021 7:42 AM Reason for block: surgical anesthesia Staffing Performed: anesthesiologist  Anesthesiologist: Val Eagle, MD Preanesthetic Checklist Completed: patient identified, IV checked, risks and benefits discussed, surgical consent, monitors and equipment checked, pre-op evaluation and timeout performed Spinal Block Patient position: sitting Prep: DuraPrep Patient monitoring: heart rate, cardiac monitor, continuous pulse ox and blood pressure Approach: midline Location: L3-4 Injection technique: single-shot Needle Needle type: Pencan  Needle gauge: 24 G Needle length: 9 cm Assessment Sensory level: T6 Events: CSF return

## 2021-04-21 NOTE — Brief Op Note (Signed)
° °  04/21/2021  11:03 AM  PATIENT:  Sandie Ano  56 y.o. male  PRE-OPERATIVE DIAGNOSIS:  right knee osteoarthritis  POST-OPERATIVE DIAGNOSIS:  right knee osteoarthritis  PROCEDURE:  Procedure(s): RIGHT TOTAL KNEE ARTHROPLASTY  SURGEON:  Surgeon(s): Cammy Copa, MD  ASSISTANT: magnant pa  ANESTHESIA:   spinal  EBL: 50 ml    Total I/O In: 1200 [I.V.:1000; IV Piggyback:200] Out: 850 [Urine:800; Blood:50]  BLOOD ADMINISTERED: none  DRAINS: none   LOCAL MEDICATIONS USED:  marcaine mso4 clonidine exparel  SPECIMEN:  No Specimen  COUNTS:  YES  TOURNIQUET:   Total Tourniquet Time Documented: area (Right) - 96 minutes Total: area (Right) - 96 minutes   DICTATION: .Other Dictation: Dictation Number [id number]  PLAN OF CARE: Admit for overnight observation  PATIENT DISPOSITION:  PACU - hemodynamically stable

## 2021-04-22 ENCOUNTER — Encounter (HOSPITAL_COMMUNITY): Payer: Self-pay | Admitting: Orthopedic Surgery

## 2021-04-22 DIAGNOSIS — E119 Type 2 diabetes mellitus without complications: Secondary | ICD-10-CM | POA: Diagnosis not present

## 2021-04-22 DIAGNOSIS — I5022 Chronic systolic (congestive) heart failure: Secondary | ICD-10-CM | POA: Diagnosis not present

## 2021-04-22 DIAGNOSIS — F17290 Nicotine dependence, other tobacco product, uncomplicated: Secondary | ICD-10-CM | POA: Diagnosis not present

## 2021-04-22 DIAGNOSIS — I110 Hypertensive heart disease with heart failure: Secondary | ICD-10-CM | POA: Diagnosis not present

## 2021-04-22 DIAGNOSIS — M1711 Unilateral primary osteoarthritis, right knee: Secondary | ICD-10-CM | POA: Diagnosis not present

## 2021-04-22 LAB — GLUCOSE, CAPILLARY
Glucose-Capillary: 154 mg/dL — ABNORMAL HIGH (ref 70–99)
Glucose-Capillary: 147 mg/dL — ABNORMAL HIGH (ref 70–99)
Glucose-Capillary: 144 mg/dL — ABNORMAL HIGH (ref 70–99)
Glucose-Capillary: 142 mg/dL — ABNORMAL HIGH (ref 70–99)

## 2021-04-22 LAB — CBC
HCT: 35.4 % — ABNORMAL LOW (ref 39.0–52.0)
Hemoglobin: 12 g/dL — ABNORMAL LOW (ref 13.0–17.0)
MCH: 31.3 pg (ref 26.0–34.0)
MCHC: 33.9 g/dL (ref 30.0–36.0)
MCV: 92.2 fL (ref 80.0–100.0)
Platelets: 196 10*3/uL (ref 150–400)
RBC: 3.84 MIL/uL — ABNORMAL LOW (ref 4.22–5.81)
RDW: 13.1 % (ref 11.5–15.5)
WBC: 10.4 10*3/uL (ref 4.0–10.5)
nRBC: 0 % (ref 0.0–0.2)

## 2021-04-22 MED ORDER — LISINOPRIL 20 MG PO TABS
40.0000 mg | ORAL_TABLET | Freq: Every day | ORAL | Status: DC
  Administered 2021-04-22 – 2021-04-23 (×2): 40 mg via ORAL
  Filled 2021-04-22 (×2): qty 2

## 2021-04-22 MED ORDER — FELODIPINE ER 2.5 MG PO TB24
2.5000 mg | ORAL_TABLET | Freq: Every day | ORAL | Status: DC
  Administered 2021-04-22 – 2021-04-23 (×2): 2.5 mg via ORAL
  Filled 2021-04-22 (×2): qty 1

## 2021-04-22 MED ORDER — CARVEDILOL 25 MG PO TABS
25.0000 mg | ORAL_TABLET | Freq: Two times a day (BID) | ORAL | Status: DC
  Administered 2021-04-22 – 2021-04-23 (×2): 25 mg via ORAL
  Filled 2021-04-22 (×2): qty 1

## 2021-04-22 MED ORDER — METFORMIN HCL 500 MG PO TABS
500.0000 mg | ORAL_TABLET | Freq: Two times a day (BID) | ORAL | Status: DC
  Administered 2021-04-22 – 2021-04-23 (×2): 500 mg via ORAL
  Filled 2021-04-22 (×2): qty 1

## 2021-04-22 NOTE — Anesthesia Postprocedure Evaluation (Signed)
Anesthesia Post Note  Patient: MANCE VALLEJO  Procedure(s) Performed: RIGHT TOTAL KNEE ARTHROPLASTY (Right: Knee)     Patient location during evaluation: PACU Anesthesia Type: Regional, Spinal and MAC Level of consciousness: awake and alert Pain management: pain level controlled Vital Signs Assessment: post-procedure vital signs reviewed and stable Respiratory status: spontaneous breathing, nonlabored ventilation, respiratory function stable and patient connected to nasal cannula oxygen Cardiovascular status: stable and blood pressure returned to baseline Postop Assessment: no apparent nausea or vomiting Anesthetic complications: no   No notable events documented.  Last Vitals:  Vitals:   04/21/21 2037 04/22/21 0722  BP: 118/72 131/72  Pulse: 84 84  Resp: 18 18  Temp: 36.9 C 36.9 C  SpO2: 97% 98%    Last Pain:  Vitals:   04/22/21 1606  TempSrc:   PainSc: 10-Worst pain ever   Pain Goal: Patients Stated Pain Goal: 3 (04/21/21 1040)                 Lyric Rossano

## 2021-04-22 NOTE — Progress Notes (Signed)
Physical Therapy Treatment Patient Details Name: Caleb Garcia MRN: [id number] DOB: 1965-04-15 Today's Date: 04/22/2021   History of Present Illness Pt is a 56 y/o male s/p R TKA. PMH includes HTN, DM, CVA, CHF.    PT Comments    Received pt semi-reclined in bed with lights off. Per RN, pt in extreme pain today. Upon entering, pt required increased time and convincing to initiate OOB mobility but was ultimately agreeable to try. Pt performed bed mobility with supervision using BUE to assist RLE in/out of bed. Sit<>stand with RW and min guard and pt ambulated throughout room with RW and min guard - pt avoiding placing weight on RLE due to high pain levels. Pt declined sitting up in recliner therefore returned to bed. Placed ice pack on R knee and assisted with repositioning in bed for comfort - emphasis on no pillows under R knee to promote extension. Acute PT to cont to follow.     Recommendations for follow up therapy are one component of a multi-disciplinary discharge planning process, led by the attending physician.  Recommendations may be updated based on patient status, additional functional criteria and insurance authorization.  Follow Up Recommendations  Follow physician's recommendations for discharge plan and follow up therapies     Assistance Recommended at Discharge Intermittent Supervision/Assistance  Patient can return home with the following A little help with walking and/or transfers;A little help with bathing/dressing/bathroom;Assistance with cooking/housework;Help with stairs or ramp for entrance;Assist for transportation   Equipment Recommendations  Rolling walker (2 wheels)    Recommendations for Other Services       Precautions / Restrictions Precautions Precautions: Knee Precaution Booklet Issued: No Precaution Comments: Verbally reviewed knee precautions with pt. Restrictions Weight Bearing Restrictions: Yes RLE Weight Bearing: Weight bearing as tolerated      Mobility  Bed Mobility Overal bed mobility: Needs Assistance Bed Mobility: Rolling, Supine to Sit, Sit to Supine Rolling: Supervision   Supine to sit: Supervision Sit to supine: Supervision   General bed mobility comments: HOB elevated and use of bedrails. Pt able to move RLE without assist from therapist but with heavy reliance on BUE support. Patient Response: Cooperative  Transfers Overall transfer level: Needs assistance Equipment used: Rolling walker (2 wheels) Transfers: Sit to/from Stand Sit to Stand: Min guard           General transfer comment: sit<>stand from EOB with min guard for safety    Ambulation/Gait Ambulation/Gait assistance: Min guard Gait Distance (Feet): 20 Feet Assistive device: Rolling walker (2 wheels) Gait Pattern/deviations: Step-to pattern, Decreased step length - right, Decreased step length - left, Antalgic, Decreased stance time - right, Decreased stride length, Decreased weight shift to right, Trunk flexed, Narrow base of support Gait velocity: Decreased Gait velocity interpretation: <1.31 ft/sec, indicative of household ambulator   General Gait Details: pt avoiding placing weight on RLE due to pain, mild R knee flexion in stance. Distance limited to within room due to high pain levels.   Stairs             Wheelchair Mobility    Modified Rankin (Stroke Patients Only)       Balance Overall balance assessment: Needs assistance Sitting-balance support: No upper extremity supported, Feet supported Sitting balance-Leahy Scale: Fair     Standing balance support: Bilateral upper extremity supported, During functional activity (RW) Standing balance-Leahy Scale: Poor Standing balance comment: pt required min guard for static/dynamic balance but heavy reliance on RW for BUE support.  Cognition Arousal/Alertness: Awake/alert Behavior During Therapy: WFL for tasks assessed/performed Overall  Cognitive Status: Within Functional Limits for tasks assessed                                          Exercises      General Comments        Pertinent Vitals/Pain Pain Assessment Pain Assessment: 0-10 Pain Score: 10-Worst pain ever Pain Location: R knee Pain Descriptors / Indicators: Grimacing, Guarding, Aching, Burning, Discomfort, Dull, Moaning, Sore, Tightness Pain Intervention(s): Limited activity within patient's tolerance, Monitored during session, Premedicated before session, Repositioned, Ice applied    Home Living                          Prior Function            PT Goals (current goals can now be found in the care plan section) Acute Rehab PT Goals Patient Stated Goal: to go home PT Goal Formulation: With patient Time For Goal Achievement: 05/05/21 Potential to Achieve Goals: Good Progress towards PT goals: Progressing toward goals    Frequency    7X/week      PT Plan Current plan remains appropriate    Co-evaluation              AM-PAC PT "6 Clicks" Mobility   Outcome Measure  Help needed turning from your back to your side while in a flat bed without using bedrails?: A Little Help needed moving from lying on your back to sitting on the side of a flat bed without using bedrails?: A Little Help needed moving to and from a bed to a chair (including a wheelchair)?: A Little Help needed standing up from a chair using your arms (e.g., wheelchair or bedside chair)?: A Little Help needed to walk in hospital room?: A Little Help needed climbing 3-5 steps with a railing? : A Lot 6 Click Score: 17    End of Session   Activity Tolerance: Patient limited by pain Patient left: in bed;with call bell/phone within reach Nurse Communication: Mobility status PT Visit Diagnosis: Pain;Other abnormalities of gait and mobility (R26.89);Difficulty in walking, not elsewhere classified (R26.2);Muscle weakness (generalized)  (M62.81) Pain - Right/Left: Right Pain - part of body: Knee     Time: [id number] PT Time Calculation (min) (ACUTE ONLY): 18 min  Charges:  $Gait Training: 8-22 mins                     Becky Sax PT, DPT  Blenda Nicely 04/22/2021, 1:55 PM

## 2021-04-22 NOTE — Progress Notes (Signed)
°  Subjective: Caleb Garcia is a 56 y.o. male s/p right TKA.  They are POD 1.  Pt's pain is moderate but controlled.  Pt denies numbness/tingling/weakness.  Pt has ambulated with some difficulty around his room yesterday.  He has not walked today.  Has not worked with physical therapy yet.  He was in the CPM machine earlier this morning and tolerated that well but his pain is flared up after the CPM.  Denies any chest pain, shortness of breath, calf pain, other complaints.  Objective: Vital signs in last 24 hours: Temp:  [98 F (36.7 C)-98.5 F (36.9 C)] 98.5 F (36.9 C) (01/18 0722) Pulse Rate:  [63-84] 84 (01/18 0722) Resp:  [13-18] 18 (01/18 0722) BP: (116-131)/(72-75) 131/72 (01/18 0722) SpO2:  [95 %-100 %] 98 % (01/18 0722)  Intake/Output from previous day: 01/17 0701 - 01/18 0700 In: 1357.3 [I.V.:1000; IV Piggyback:357.3] Out: 1950 [Urine:1900; Blood:50] Intake/Output this shift: Total I/O In: 240 [P.O.:240] Out: 250 [Urine:250]  Exam:  No gross blood or drainage overlying the dressing 2+ DP pulse Sensation intact distally in the right foot Able to dorsiflex and plantarflex the right foot No calf tenderness.  Negative Homans' sign.  Patient is able to perform straight leg raise   Labs: Recent Labs    04/22/21 0201  HGB 12.0*   Recent Labs    04/22/21 0201  WBC 10.4  RBC 3.84*  HCT 35.4*  PLT 196   Recent Labs    04/21/21 1454  NA 136  K 3.9  CL 102  CO2 25  BUN 10  CREATININE 0.87  GLUCOSE 152*  CALCIUM 9.0   No results for input(s): LABPT, INR in the last 72 hours.  Assessment/Plan: Pt is POD 1 s/p right TKA.    -Plan to discharge to home today or tomorrow pending patient's pain and PT eval  -WBAT with a walker    Barbar Brede L Morgann Woodburn 04/22/2021, 11:48 AM

## 2021-04-23 ENCOUNTER — Other Ambulatory Visit: Payer: Self-pay | Admitting: Surgical

## 2021-04-23 DIAGNOSIS — I5022 Chronic systolic (congestive) heart failure: Secondary | ICD-10-CM | POA: Diagnosis not present

## 2021-04-23 DIAGNOSIS — M1711 Unilateral primary osteoarthritis, right knee: Secondary | ICD-10-CM | POA: Diagnosis not present

## 2021-04-23 DIAGNOSIS — Z96651 Presence of right artificial knee joint: Secondary | ICD-10-CM | POA: Diagnosis not present

## 2021-04-23 DIAGNOSIS — F17290 Nicotine dependence, other tobacco product, uncomplicated: Secondary | ICD-10-CM | POA: Diagnosis not present

## 2021-04-23 DIAGNOSIS — I110 Hypertensive heart disease with heart failure: Secondary | ICD-10-CM | POA: Diagnosis not present

## 2021-04-23 DIAGNOSIS — E119 Type 2 diabetes mellitus without complications: Secondary | ICD-10-CM | POA: Diagnosis not present

## 2021-04-23 LAB — CBC
HCT: 33.4 % — ABNORMAL LOW (ref 39.0–52.0)
Hemoglobin: 11.3 g/dL — ABNORMAL LOW (ref 13.0–17.0)
MCH: 30.9 pg (ref 26.0–34.0)
MCHC: 33.8 g/dL (ref 30.0–36.0)
MCV: 91.3 fL (ref 80.0–100.0)
Platelets: 165 10*3/uL (ref 150–400)
RBC: 3.66 MIL/uL — ABNORMAL LOW (ref 4.22–5.81)
RDW: 13.1 % (ref 11.5–15.5)
WBC: 10.6 10*3/uL — ABNORMAL HIGH (ref 4.0–10.5)
nRBC: 0 % (ref 0.0–0.2)

## 2021-04-23 LAB — GLUCOSE, CAPILLARY
Glucose-Capillary: 173 mg/dL — ABNORMAL HIGH (ref 70–99)
Glucose-Capillary: 147 mg/dL — ABNORMAL HIGH (ref 70–99)
Glucose-Capillary: 114 mg/dL — ABNORMAL HIGH (ref 70–99)

## 2021-04-23 MED ORDER — DOCUSATE SODIUM 100 MG PO CAPS: 100.0000 mg | ORAL_CAPSULE | Freq: Two times a day (BID) | ORAL | 0 refills | Status: DC

## 2021-04-23 MED ORDER — OXYCODONE HCL 5 MG PO TABS
5.0000 mg | ORAL_TABLET | ORAL | Status: DC | PRN
  Administered 2021-04-23: 10 mg via ORAL
  Filled 2021-04-23: qty 2

## 2021-04-23 MED ORDER — METHOCARBAMOL 500 MG PO TABS: 500.0000 mg | ORAL_TABLET | Freq: Three times a day (TID) | ORAL | 0 refills | Status: DC | PRN

## 2021-04-23 MED ORDER — OXYCODONE HCL 5 MG PO TABS
5.0000 mg | ORAL_TABLET | ORAL | 0 refills | Status: DC | PRN
Start: 2021-04-23 — End: 2021-04-23

## 2021-04-23 MED ORDER — OXYCODONE HCL 5 MG PO TABS: 5.0000 mg | ORAL_TABLET | ORAL | 0 refills | Status: DC | PRN

## 2021-04-23 MED ORDER — ASPIRIN 81 MG PO CHEW: 81.0000 mg | CHEWABLE_TABLET | Freq: Two times a day (BID) | ORAL | 0 refills | Status: DC

## 2021-04-23 NOTE — TOC Transition Note (Signed)
Transition of Care [facility]) - CM/SW Discharge Note   Patient Details  Name: Caleb Garcia MRN: [id number] Date of Birth: May 22, 1965  [facility] CM/SW Contact:  Epifanio Lesches, RN Phone Number: 04/23/2021, 5:20 PM   Clinical Narrative:    Patient will DC to: home  Anticipated DC date: 04/23/2021 Family notified: yes Transport by: car        -s/p Right total knee replacement , 1/17  Per MD patient ready for DC today. RN, and patient notified of DC.   Home health orders noted for PT. Pt agreeable to services. [facility], [facility], [facility] declined for services. [facility] acceptance pending. Pt states friend to assist with care if need once d/c. Pt without DME needs. States has RW, CPM  device. Pt without Rx MED concerns.  Post hospital f/u noted on AVS Friend to provide transportation to home.  Pt's cell # [phone]  RNCM will sign off for now as intervention is no longer needed. Please consult [place] again if new needs arise.    Final next level of care: Home w Home Health Services Barriers to Discharge: No Barriers Identified   Patient Goals and CMS Choice     Choice offered to / list presented to : Patient  Discharge Placement                       Discharge Plan and Services                                     Social Determinants of Health (SDOH) Interventions     Readmission Risk Interventions No flowsheet data found.

## 2021-04-23 NOTE — Progress Notes (Signed)
Patient doing well. Plan to trial oral only medication with Oxycodone 5-10mg  q4h. Discontinued IV dilaudid. If he can control his pain with oral oxycodone, we can discharge home this afternoon. Will reassess after procedure around 3-4 PM

## 2021-04-23 NOTE — Plan of Care (Signed)

## 2021-04-23 NOTE — Progress Notes (Signed)
°  Subjective: Caleb Garcia is a 56 y.o. male s/p right TKA.  They are POD 2.  Pt's pain is controlled.  Pt denies numbness/tingling/weakness.  Pt has ambulated with some difficulty.  No symptomatic lightheadedness or dizziness.  Denies any chest pain or shortness of breath.  No calf pain or abdominal pain.  Feels well and his only complaint is his knee pain.  He has been able to ambulate a significant distance.  Had a short-lived fever but no current complaint of fever, chills, night sweats.  He feels he is ready for discharge.  He was evaluated at around noon.   Objective: Vital signs in last 24 hours: Temp:  [99.1 F (37.3 C)-101.1 F (38.4 C)] 99.3 F (37.4 C) (01/19 0718) Pulse Rate:  [86-95] 95 (01/19 0718) Resp:  [16-18] 18 (01/19 0718) BP: (148-163)/(76-83) 148/81 (01/19 0718) SpO2:  [98 %-100 %] 100 % (01/19 0718)  Intake/Output from previous day: 01/18 0701 - 01/19 0700 In: 240 [P.O.:240] Out: 550 [Urine:550] Intake/Output this shift: No intake/output data recorded.  Exam:  No gross blood or drainage overlying the dressing 2+ DP pulse Sensation intact distally in the right foot Able to dorsiflex and plantarflex the right foot Able to perform straight leg raise with right lower extremity.  No calf tenderness.  Negative Homans' sign.   Labs: Recent Labs    04/22/21 0201 04/23/21 0245  HGB 12.0* 11.3*   Recent Labs    04/22/21 0201 04/23/21 0245  WBC 10.4 10.6*  RBC 3.84* 3.66*  HCT 35.4* 33.4*  PLT 196 165   Recent Labs    04/21/21 1454  NA 136  K 3.9  CL 102  CO2 25  BUN 10  CREATININE 0.87  GLUCOSE 152*  CALCIUM 9.0   No results for input(s): LABPT, INR in the last 72 hours.  Assessment/Plan: Pt is POD 2 s/p right TKA.    -Plan to discharge to home today  -WBAT with a walker  -Did well with trial of oral medication for pain control.  He was able to mobilize well with technician and thus was discharged home.     Zuzanna Maroney L  Diamone Whistler 04/23/2021, 9:20 PM

## 2021-04-23 NOTE — Progress Notes (Signed)
Physical Therapy Treatment Patient Details Name: Caleb Garcia MRN: [id number] DOB: [birth year] Today's Date: 04/23/2021   History of Present Illness Pt is a 56 y/o male s/p R TKA. PMH includes HTN, DM, CVA, CHF.    PT Comments    Pt continues to have c/o pain and reluctant to mobilize or complete exercises. Pt educated extensively on the importance of R knee ROM and ambulation, pt not appreciative. Pt did amb 175' with antalgic step to pattern with RW. Pt did progressively tolerate increased weightbearing on R foot t/o ambulation. Pt with good return demonstration on sequencing of stairs in prep for d/c home. Pt functioning at supervision level. Pt safe to d/c home from mobility stand point and does not need a second session. Discussed car transfers with pt and family member. Acute PT to cont to follow.   Recommendations for follow up therapy are one component of a multi-disciplinary discharge planning process, led by the attending physician.  Recommendations may be updated based on patient status, additional functional criteria and insurance authorization.  Follow Up Recommendations  Follow physician's recommendations for discharge plan and follow up therapies     Assistance Recommended at Discharge Intermittent Supervision/Assistance  Patient can return home with the following A little help with walking and/or transfers;A little help with bathing/dressing/bathroom;Assistance with cooking/housework;Help with stairs or ramp for entrance;Assist for transportation   Equipment Recommendations  Rolling walker (2 wheels)    Recommendations for Other Services       Precautions / Restrictions Precautions Precautions: Knee Precaution Comments: verberally reviewed importance of bone foam and not putting anything under the knee Restrictions Weight Bearing Restrictions: Yes RLE Weight Bearing: Weight bearing as tolerated     Mobility  Bed Mobility Overal bed mobility: Needs  Assistance Bed Mobility: Supine to Sit     Supine to sit: Supervision     General bed mobility comments: HOB elevated, verbal cues on how to move R LE off EOB, increased time, labored effort    Transfers Overall transfer level: Needs assistance Equipment used: Rolling walker (2 wheels) Transfers: Sit to/from Stand Sit to Stand: Min guard           General transfer comment: verbal cues for hand placement/not to pull up on walker, verbal cues to reach back and control descent into chair    Ambulation/Gait Ambulation/Gait assistance: Min guard Gait Distance (Feet): 175 Feet Assistive device: Rolling walker (2 wheels) Gait Pattern/deviations: Step-to pattern, Decreased stride length Gait velocity: dec Gait velocity interpretation: <1.31 ft/sec, indicative of household ambulator   General Gait Details: pt initially with minimal WBIng on R LE with max encouragement to increase R LE WBing and decrease dependency on UEs   Stairs Stairs: Yes Stairs assistance: Min guard Stair Management: Two rails, Step to pattern, Forwards Number of Stairs: 2 General stair comments: 2 steps to mimic home, educated pt on sequencing "up with the good, down with the bad (R) in step to pattern. Pt with good return demonstration   Wheelchair Mobility    Modified Rankin (Stroke Patients Only)       Balance Overall balance assessment: Needs assistance Sitting-balance support: No upper extremity supported, Feet supported Sitting balance-Leahy Scale: Fair     Standing balance support: Bilateral upper extremity supported, During functional activity Standing balance-Leahy Scale: Fair Standing balance comment: pt able to stand up to urinate with unilateral  Cognition Arousal/Alertness: Awake/alert Behavior During Therapy: WFL for tasks assessed/performed Overall Cognitive Status: Within Functional Limits for tasks assessed                                  General Comments: pt irritated due to pain        Exercises Total Joint Exercises Ankle Circles/Pumps: AROM, Both, 5 reps, Supine Quad Sets: AROM, Right, 10 reps, Supine Heel Slides: AROM, AAROM, Right, 10 reps, Seated (used towel below foot, pt actively completed and then PT would gain more range passively, pt with poor tolerance) Long Arc Quad: AROM, Right, 10 reps, Seated Goniometric ROM: active sitting R knee flexion to 47 deg    General Comments General comments (skin integrity, edema, etc.): pt with noted mild drainage in dressing, pt with noted edema t/o R LE      Pertinent Vitals/Pain Pain Assessment Pain Assessment: 0-10 Pain Score: 7  Pain Location: R knee Pain Descriptors / Indicators: Grimacing, Guarding, Aching, Burning, Discomfort, Dull, Moaning, Sore, Tightness Pain Intervention(s): Premedicated before session (pt given IV dilaudid 30 min prior to PT session and then asked for more pain meds after session, RN notified)    Home Living                          Prior Function            PT Goals (current goals can now be found in the care plan section) Acute Rehab PT Goals Patient Stated Goal: stop the pain Progress towards PT goals: Progressing toward goals    Frequency    7X/week      PT Plan Current plan remains appropriate    Co-evaluation              AM-PAC PT "6 Clicks" Mobility   Outcome Measure  Help needed turning from your back to your side while in a flat bed without using bedrails?: A Little Help needed moving from lying on your back to sitting on the side of a flat bed without using bedrails?: A Little Help needed moving to and from a bed to a chair (including a wheelchair)?: A Little Help needed standing up from a chair using your arms (e.g., wheelchair or bedside chair)?: A Little Help needed to walk in hospital room?: A Little Help needed climbing 3-5 steps with a railing? : A Little 6 Click Score:  18    End of Session Equipment Utilized During Treatment: Gait belt Activity Tolerance: Patient limited by pain Patient left: in chair;with call bell/phone within reach;with chair alarm set;with family/visitor present Nurse Communication: Mobility status PT Visit Diagnosis: Pain;Other abnormalities of gait and mobility (R26.89);Difficulty in walking, not elsewhere classified (R26.2);Muscle weakness (generalized) (M62.81) Pain - Right/Left: Right Pain - part of body: Knee     Time: 0912-0950 PT Time Calculation (min) (ACUTE ONLY): 38 min  Charges:  $Gait Training: 23-37 mins $Therapeutic Exercise: 8-22 mins                     Lewis Shock, PT, DPT Acute Rehabilitation Services Pager #: [phone] Office #: [phone]    Iona Hansen 04/23/2021, 12:03 PM

## 2021-04-23 NOTE — Progress Notes (Signed)
Patient taught discharge education, belongings collected and IV removed

## 2021-04-23 NOTE — Progress Notes (Signed)
Mobility Specialist Criteria Algorithm Info. ° ° 04/23/21 1425  °Pain Assessment  °Pain Assessment 0-10  °Pain Score 5  °Pain Location R knee  °Pain Descriptors / Indicators Aching  °Pain Intervention(s) Premedicated before session;Limited activity within patient's tolerance  °Mobility  °Bed Position Semi-fowlers  °Activity Ambulated with assistance in hallway  °Range of Motion/Exercises Active;All extremities  °Level of Assistance Standby assist, set-up cues, supervision of patient - no hands on  °Assistive Device Front wheel walker  °RLE Weight Bearing WBAT  °Activity Response Tolerated well  ° °Patient received lying supine in bed. Denies pain prior to ambulation. Was independent to EOB from supine > sit. Ambulated in hallway supervision level with steady gait. Returned to room without incident or complaint. Pain is better managed and seem to tolerate bearing weight better. Was left lying supine in bed with all needs met and call bell in reach. ° °04/23/2021 °2:51 PM ° °Jordan Tripp, CMS, BS EXP °Acute Rehabilitation Services  °Phone:[phone] °Office: [phone] ° °

## 2021-04-24 NOTE — Telephone Encounter (Signed)
Already addressed this, [facility], got it worked out

## 2021-04-27 ENCOUNTER — Telehealth: Payer: Self-pay | Admitting: Surgical

## 2021-04-27 ENCOUNTER — Telehealth: Payer: Self-pay | Admitting: Orthopedic Surgery

## 2021-04-27 ENCOUNTER — Other Ambulatory Visit: Payer: Self-pay | Admitting: Surgical

## 2021-04-27 MED ORDER — OXYCODONE HCL 5 MG PO TABS: 5.0000 mg | ORAL_TABLET | Freq: Four times a day (QID) | ORAL | 0 refills | Status: DC | PRN

## 2021-04-27 NOTE — Telephone Encounter (Signed)
Pt called requesting refill of oxycodone. Please send to pharmacy on file. Pt phone number is [phone].

## 2021-04-27 NOTE — Telephone Encounter (Signed)
Pt called stated that [facility] [address] is out of Oxycodone. He is asking for medication to be sent to [facility] on [address]. Please call pt when sent to new [facility]. Pt phone number is [phone].

## 2021-04-27 NOTE — Telephone Encounter (Signed)
Sent in refill

## 2021-04-28 NOTE — Telephone Encounter (Signed)
Resubmitted to new pharmacy and I called him to inform him last night

## 2021-04-29 ENCOUNTER — Telehealth: Payer: Self-pay | Admitting: Orthopedic Surgery

## 2021-04-29 NOTE — Telephone Encounter (Signed)
Received $25.00 cash and medical records release form/Forwarding to [facility] today 

## 2021-04-30 ENCOUNTER — Other Ambulatory Visit: Payer: Self-pay | Admitting: Surgical

## 2021-04-30 ENCOUNTER — Telehealth: Payer: Self-pay | Admitting: Orthopedic Surgery

## 2021-04-30 MED ORDER — OXYCODONE HCL 5 MG PO TABS: ORAL_TABLET | ORAL | 0 refills | Status: DC

## 2021-04-30 NOTE — Telephone Encounter (Signed)
Pt called requesting another refill of oxycodone. Please call pt about this matter at [phone]

## 2021-04-30 NOTE — Telephone Encounter (Signed)
IC advised.  

## 2021-04-30 NOTE — Telephone Encounter (Signed)
Need to stop taking 2 pills every 4 hours. Okay for one pill every 4-6 hours.  I will refill but he needs to stick with this as the longer he is on high dose opioids, the harder it will be to wean off.  [facility] [address]

## 2021-05-01 ENCOUNTER — Other Ambulatory Visit: Payer: Self-pay | Admitting: Surgical

## 2021-05-01 ENCOUNTER — Telehealth: Payer: Self-pay | Admitting: Orthopedic Surgery

## 2021-05-01 NOTE — Telephone Encounter (Signed)
Patient called needing Rx refilled Methocarbamol. Patient uses [facility] [address].   The number to contact patient is [phone]

## 2021-05-01 NOTE — Telephone Encounter (Signed)
refilled 

## 2021-05-01 NOTE — Telephone Encounter (Signed)
Please advise 

## 2021-05-06 ENCOUNTER — Ambulatory Visit (INDEPENDENT_AMBULATORY_CARE_PROVIDER_SITE_OTHER): Payer: BC Managed Care – PPO

## 2021-05-06 ENCOUNTER — Ambulatory Visit (INDEPENDENT_AMBULATORY_CARE_PROVIDER_SITE_OTHER): Payer: BC Managed Care – PPO | Admitting: Surgical

## 2021-05-06 ENCOUNTER — Other Ambulatory Visit: Payer: Self-pay

## 2021-05-06 ENCOUNTER — Encounter: Payer: Self-pay | Admitting: Orthopedic Surgery

## 2021-05-06 DIAGNOSIS — Z9889 Other specified postprocedural states: Secondary | ICD-10-CM

## 2021-05-06 MED ORDER — GABAPENTIN 300 MG PO CAPS: 300.0000 mg | ORAL_CAPSULE | Freq: Three times a day (TID) | ORAL | 0 refills | Status: DC

## 2021-05-06 MED ORDER — OXYCODONE HCL 5 MG PO TABS: 5.0000 mg | ORAL_TABLET | Freq: Four times a day (QID) | ORAL | 0 refills | Status: DC | PRN

## 2021-05-06 MED ORDER — METHOCARBAMOL 500 MG PO TABS: ORAL_TABLET | ORAL | 0 refills | Status: DC

## 2021-05-06 MED ORDER — CELECOXIB 100 MG PO CAPS: 100.0000 mg | ORAL_CAPSULE | Freq: Two times a day (BID) | ORAL | 0 refills | Status: DC

## 2021-05-07 ENCOUNTER — Ambulatory Visit: Payer: BC Managed Care – PPO | Admitting: Rehabilitative and Restorative Service Providers"

## 2021-05-07 ENCOUNTER — Encounter: Payer: Self-pay | Admitting: Rehabilitative and Restorative Service Providers"

## 2021-05-07 DIAGNOSIS — M25661 Stiffness of right knee, not elsewhere classified: Secondary | ICD-10-CM

## 2021-05-07 DIAGNOSIS — R262 Difficulty in walking, not elsewhere classified: Secondary | ICD-10-CM

## 2021-05-07 DIAGNOSIS — M25561 Pain in right knee: Secondary | ICD-10-CM | POA: Diagnosis not present

## 2021-05-07 DIAGNOSIS — M6281 Muscle weakness (generalized): Secondary | ICD-10-CM

## 2021-05-07 DIAGNOSIS — R600 Localized edema: Secondary | ICD-10-CM | POA: Diagnosis not present

## 2021-05-07 NOTE — Therapy (Signed)
OUTPATIENT PHYSICAL THERAPY LOWER EXTREMITY EVALUATION   Patient Name: Caleb Garcia MRN: [id number] DOB:[birth year], 56 y.o., male Today's Date: 05/07/2021   PT End of Session - 05/07/21 1506     Visit Number 1    Number of Visits 18    Date for PT Re-Evaluation 07/02/21    Authorization - Number of Visits 30    PT Start Time 1431    PT Stop Time 1516    PT Time Calculation (min) 45 min    Activity Tolerance Patient limited by pain    Behavior During Therapy [facility] for tasks assessed/performed             Past Medical History:  Diagnosis Date   Arthritis    Cardiomyopathy    Non-ischemic   CHF (congestive heart failure) (HCC)    Diabetes mellitus without complication (HCC)    HTN (hypertension)    Hyperlipidemia    Hypokalemia    Obesity    OSA (obstructive sleep apnea)    Sleep apnea    had surgery- no cpap   Stroke (HCC) 10/2008   Systolic CHF [facility])    Past Surgical History:  Procedure Laterality Date   avulsion fracture of left hip     CARDIAC CATHETERIZATION  10/08/08   LEFT HEART CATH AND CORONARY ANGIOGRAPHY N/A 08/03/2016   Procedure: Left Heart Cath and Coronary Angiography;  Surgeon: Dolores Patty, MD;  Location: [facility] INVASIVE CV LAB;  Service: Cardiovascular;  Laterality: N/A;   TONSILLECTOMY     TOTAL KNEE ARTHROPLASTY Right 04/21/2021   Procedure: RIGHT TOTAL KNEE ARTHROPLASTY;  Surgeon: Cammy Copa, MD;  Location: [facility] OR;  Service: Orthopedics;  Laterality: Right;   UVULOPALATOPHARYNGOPLASTY     Patient Active Problem List   Diagnosis Date Noted   S/P total knee arthroplasty, right 04/21/2021   Chest discomfort 07/28/2016   Controlled type 2 diabetes mellitus without complication (HCC) 11/26/2013   Bilateral shoulder pain 10/31/2013   Erectile dysfunction 04/18/2013   Mixed hyperlipidemia 11/26/2009   LIMB PAIN 07/23/2009   GERD 04/29/2009   Obesity 10/22/2008   OBSTRUCTIVE SLEEP APNEA 10/22/2008   Essential hypertension 10/22/2008    CARDIOMYOPATHY 10/22/2008   CONGESTIVE HEART FAILURE, MILD 10/22/2008   Chronic systolic heart failure (HCC) 10/16/2008    PCP: Shirline Frees, NP  REFERRING PROVIDER: Cammy Copa, MD  REFERRING DIAG: [phone] (ICD-10-CM) - Post-operative state Z96.659 (ICD-10-CM) - S/P TKR (total knee replacement)   THERAPY DIAG:  Difficulty in walking, not elsewhere classified  Localized edema  Stiffness of right knee, not elsewhere classified  Right knee pain, unspecified chronicity  Muscle weakness (generalized)  ONSET DATE: 04/21/2021  SUBJECTIVE:   SUBJECTIVE STATEMENT: Caleb Garcia had a R knee TKA 02/19/2022.  PERTINENT HISTORY: Previous L hip fracture, cardiac history, DM and previous stroke.  PAIN:  Are you having pain? Yes NPRS scale: 7/10 Pain location: R knee Pain orientation: Right  PAIN TYPE: aching Pain description: constant  Aggravating factors: Hurts constantly Relieving factors: Pain medications  PRECAUTIONS: Knee  WEIGHT BEARING RESTRICTIONS Yes WBAT (do not force WB due to edema concerns)  FALLS:  Has patient fallen in last 6 months? No, Number of falls: 0  OCCUPATION: Drives a truck  PLOF: Independent  PATIENT GOALS Return to work, be able to walk normally and do stairs without pain.   OBJECTIVE:   DIAGNOSTIC FINDINGS: Montrez has increased R knee edema, limited R knee AROM, poor quadriceps strength and gait impairments (B axillary crutches) in  need of skilled care s/p R TKA.  PATIENT SURVEYS:  FOTO 49 (Goal 68 in 14 visits)  COGNITION:  Overall cognitive status: Within functional limits for tasks assessed     LE AROM/PROM:  A/PROM Right 05/07/2021 Left 05/07/2021  Hip flexion    Hip extension    Hip abduction    Hip adduction    Hip internal rotation    Hip external rotation    Knee flexion 74 122  Knee extension -8 0  Ankle dorsiflexion    Ankle plantarflexion    Ankle inversion    Ankle eversion     (Blank rows = not  tested)  LE MMT:  MMT Deferred due to edema and time constraints Right 05/07/2021 Left 05/07/2021  Hip flexion    Hip extension    Hip abduction    Hip adduction    Hip internal rotation    Hip external rotation    Knee flexion    Knee extension    Ankle dorsiflexion    Ankle plantarflexion    Ankle inversion    Ankle eversion     (Blank rows = not tested)   TODAY'S TREATMENT: Access Code: [email or web address] URL: [email or web address] Date: 05/07/2021 Prepared by: Pauletta Browns  Exercises Supine Quadricep Sets - 3-5 x daily - 7 x weekly - 2-3 sets - 10 reps - 5 second hold Seated Knee Flexion AAROM - 3 x daily - 7 x weekly - 1 sets - 1 reps - 3 minutes hold    PATIENT EDUCATION:  Education details: Reviewed exam findings, emphasis on edema control, AROM and quadriceps activation. Person educated: Patient Education method: Explanation, Demonstration, Tactile cues, Verbal cues, and Handouts Education comprehension: verbalized understanding, returned demonstration, verbal cues required, tactile cues required, and needs further education   HOME EXERCISE PROGRAM: Access Code: [email or web address] URL: [email or web address] Date: 05/07/2021 Prepared by: Pauletta Browns  Exercises Supine Quadricep Sets - 3-5 x daily - 7 x weekly - 2-3 sets - 10 reps - 5 second hold Seated Knee Flexion AAROM - 3 x daily - 7 x weekly - 1 sets - 1 reps - 3 minutes hold   ASSESSMENT:  CLINICAL IMPRESSION: Patient is a 56 y.o. male who was seen today for physical therapy evaluation and treatment for s/p R TKA. Objective impairments include Abnormal gait, decreased activity tolerance, decreased endurance, decreased knowledge of condition, difficulty walking, decreased ROM, decreased strength, increased edema, impaired perceived functional ability, and pain. These impairments are limiting patient from community activity, driving, and occupation. Personal factors including Previous L hip  fracture, cardiac history, DM and previous stroke are also affecting patient's functional outcome. Patient will benefit from skilled PT to address above impairments and improve overall function.  REHAB POTENTIAL: Good  CLINICAL DECISION MAKING: Stable/uncomplicated  EVALUATION COMPLEXITY: Low   GOALS: Goals reviewed with patient? Yes  SHORT TERM GOALS:  STG Name Target Date Goal status  1 Vihaan will have R knee AROM at -4 to 90 degrees. Baseline: -8 to 74 degrees 06/04/2021 INITIAL   LONG TERM GOALS:   LTG Name Target Date Goal status  1 Improve FOTO to 68 in 14 visits. Baseline: 49 07/02/2021 INITIAL  2 Improve R knee pain to 0-4/10 (0-3/10 long-term) on the VAS. Baseline: 7/10 07/02/2021 INITIAL  3 Improve R knee AROM to 0-110 degrees. Baseline: -8 to 74 degrees 07/02/2021 INITIAL  4 Improve R quadriceps strength as assessed by objective testing and functional scores. Baseline: Using crutches, FOTO 49, objective test deferred due to  time and edema 07/02/2021 INITIAL  5 Demetrice will be independent with his long-term HEP at DC. Baseline: 07/02/2021 INITIAL   PLAN: PT FREQUENCY:  1-3X/week starting at 2-3X/week  PT DURATION: 8 weeks  PLANNED INTERVENTIONS: Therapeutic exercises, Therapeutic activity, Neuro Muscular re-education, Balance training, Gait training, Patient/Family education, Stair training, Cryotherapy, Vasopneumatic device, and Manual therapy  PLAN FOR NEXT SESSION: Review starter HEP, emphasis on edema control, AROM (extension & flexion) and quadriceps strengthening.   Cherlyn Cushing, PT, MPT 05/07/2021, 3:14 PM

## 2021-05-08 ENCOUNTER — Encounter: Payer: Self-pay | Admitting: Orthopedic Surgery

## 2021-05-08 NOTE — Progress Notes (Signed)
Post-Op Visit Note   Patient: Caleb Garcia           Date of Birth: 29-Nov-1965           MRN: [id number] Visit Date: 05/06/2021 PCP: Shirline Frees, NP   Assessment & Plan:  Chief Complaint:  Chief Complaint  Patient presents with   Right Knee - Routine Post Op   Visit Diagnoses:  1. Post-operative state     Plan: Patient is a 56 year old male who presents s/p right total knee arthroplasty 04/21/2021.  Patient is ambulating with crutches.  He notes "some good hours and a lot of bad hours".  Denies any fevers, chills, drainage.  No chest pain or shortness of breath.  Denies any calf pain.  Pain is waking him up at night at times.  Has not had any falls since surgery.  On exam, patient has 10 degrees extension and 75 degrees of knee flexion.  Able to perform straight leg raise with slight extensor lag.  Small effusion noted.  No calf tenderness.  Negative Homans' sign.  Patient appears well and does not appear ill.  Plan is to refill medications.  Did inform him that with each refill of the pain medicine, the frequency will be reduced until he has opioid medication discontinued entirely.  Start physical therapy upstairs to work on knee range of motion, gait training, quadricep strengthening.  Copy of short-term disability paperwork and operative note was provided to the patient.  He will follow-up in 2 weeks for clinical recheck with Dr. August Saucer, primarily to check his extension.  He was counseled extensively on how to best work on achieving 0 degrees of extension.  He has not really had any home health physical therapy according to him so he will benefit from performing exercise at home and for being set up with physical therapy.  Right knee radiographs look good today.  Follow-Up Instructions: No follow-ups on file.   Orders:  Orders Placed This Encounter  Procedures   XR Knee 1-2 Views Right   Ambulatory referral to Physical Therapy   Meds ordered this encounter  Medications    oxyCODONE (OXY IR/ROXICODONE) 5 MG immediate release tablet    Sig: Take 1 tablet (5 mg total) by mouth every 6 (six) hours as needed for severe pain.    Dispense:  30 tablet    Refill:  0    Call [phone] if there is not enough tablets, other pharmacies have run out   methocarbamol (ROBAXIN) 500 MG tablet    Sig: TAKE 1 TABLET BY MOUTH EVERY 8 HOURS AS NEEDED FOR MUSCLE SPASM    Dispense:  30 tablet    Refill:  0   celecoxib (CELEBREX) 100 MG capsule    Sig: Take 1 capsule (100 mg total) by mouth 2 (two) times daily.    Dispense:  60 capsule    Refill:  0   gabapentin (NEURONTIN) 300 MG capsule    Sig: Take 1 capsule (300 mg total) by mouth 3 (three) times daily.    Dispense:  30 capsule    Refill:  0    Imaging: No results found.  PMFS History: Patient Active Problem List   Diagnosis Date Noted   S/P total knee arthroplasty, right 04/21/2021   Chest discomfort 07/28/2016   Controlled type 2 diabetes mellitus without complication (HCC) 11/26/2013   Bilateral shoulder pain 10/31/2013   Erectile dysfunction 04/18/2013   Mixed hyperlipidemia 11/26/2009   LIMB PAIN 07/23/2009  GERD 04/29/2009   Obesity 10/22/2008   OBSTRUCTIVE SLEEP APNEA 10/22/2008   Essential hypertension 10/22/2008   CARDIOMYOPATHY 10/22/2008   CONGESTIVE HEART FAILURE, MILD 10/22/2008   Chronic systolic heart failure (HCC) 10/16/2008   Past Medical History:  Diagnosis Date   Arthritis    Cardiomyopathy    Non-ischemic   CHF (congestive heart failure) (HCC)    Diabetes mellitus without complication (HCC)    HTN (hypertension)    Hyperlipidemia    Hypokalemia    Obesity    OSA (obstructive sleep apnea)    Sleep apnea    had surgery- no cpap   Stroke (HCC) 10/2008   Systolic CHF (HCC)     Family History  Problem Relation Age of Onset   Diabetes Mother    Hypertension Mother    Heart disease Mother    Cancer Father        Throat cancer   Stroke Father    Stroke Brother 89   Prostate  cancer Neg Hx    Colon cancer Neg Hx    Colon polyps Neg Hx    Esophageal cancer Neg Hx    Rectal cancer Neg Hx    Stomach cancer Neg Hx     Past Surgical History:  Procedure Laterality Date   avulsion fracture of left hip     CARDIAC CATHETERIZATION  10/08/08   LEFT HEART CATH AND CORONARY ANGIOGRAPHY N/A 08/03/2016   Procedure: Left Heart Cath and Coronary Angiography;  Surgeon: Dolores Patty, MD;  Location: [facility] INVASIVE CV LAB;  Service: Cardiovascular;  Laterality: N/A;   TONSILLECTOMY     TOTAL KNEE ARTHROPLASTY Right 04/21/2021   Procedure: RIGHT TOTAL KNEE ARTHROPLASTY;  Surgeon: Cammy Copa, MD;  Location: [facility] OR;  Service: Orthopedics;  Laterality: Right;   UVULOPALATOPHARYNGOPLASTY     Social History   Occupational History    Employer: UNEMPLOYED  Tobacco Use   Smoking status: Some Days    Types: Cigars   Smokeless tobacco: Never  Vaping Use   Vaping Use: Never used  Substance and Sexual Activity   Alcohol use: No   Drug use: No   Sexual activity: Not on file

## 2021-05-09 DIAGNOSIS — M1711 Unilateral primary osteoarthritis, right knee: Secondary | ICD-10-CM

## 2021-05-13 ENCOUNTER — Ambulatory Visit: Payer: BC Managed Care – PPO | Admitting: Physical Therapy

## 2021-05-13 ENCOUNTER — Encounter: Payer: Self-pay | Admitting: Physical Therapy

## 2021-05-13 ENCOUNTER — Other Ambulatory Visit: Payer: Self-pay

## 2021-05-13 DIAGNOSIS — M25561 Pain in right knee: Secondary | ICD-10-CM

## 2021-05-13 DIAGNOSIS — R600 Localized edema: Secondary | ICD-10-CM

## 2021-05-13 DIAGNOSIS — R262 Difficulty in walking, not elsewhere classified: Secondary | ICD-10-CM

## 2021-05-13 DIAGNOSIS — M6281 Muscle weakness (generalized): Secondary | ICD-10-CM

## 2021-05-13 DIAGNOSIS — M25661 Stiffness of right knee, not elsewhere classified: Secondary | ICD-10-CM | POA: Diagnosis not present

## 2021-05-13 NOTE — Therapy (Signed)
OUTPATIENT PHYSICAL THERAPY TREATMENT NOTE   Patient Name: Caleb Garcia MRN: [id number] DOB:19-Jan-1966, 56 y.o., male Today's Date: 05/13/2021  PCP: Dorothyann Peng, NP REFERRING PROVIDER: Meredith Pel, MD   PT End of Session - 05/13/21 1004     Visit Number 2    Number of Visits 18    Date for PT Re-Evaluation 07/02/21    Authorization - Number of Visits 59    PT Start Time 1004    PT Stop Time 1054    PT Time Calculation (min) 50 min    Activity Tolerance Patient limited by pain    Behavior During Therapy [facility] for tasks assessed/performed             Past Medical History:  Diagnosis Date   Arthritis    Cardiomyopathy    Non-ischemic   CHF (congestive heart failure) ([place])    Diabetes mellitus without complication ([place])    HTN (hypertension)    Hyperlipidemia    Hypokalemia    Obesity    OSA (obstructive sleep apnea)    Sleep apnea    had surgery- no cpap   Stroke ([place]) [id number]   Systolic CHF [facility])    Past Surgical History:  Procedure Laterality Date   avulsion fracture of left hip     CARDIAC CATHETERIZATION  10/08/08   LEFT HEART CATH AND CORONARY ANGIOGRAPHY N/A 08/03/2016   Procedure: Left Heart Cath and Coronary Angiography;  Surgeon: Jolaine Artist, MD;  Location: [facility];  Service: Cardiovascular;  Laterality: N/A;   TONSILLECTOMY     TOTAL KNEE ARTHROPLASTY Right 04/21/2021   Procedure: RIGHT TOTAL KNEE ARTHROPLASTY;  Surgeon: Meredith Pel, MD;  Location: [place];  Service: Orthopedics;  Laterality: Right;   UVULOPALATOPHARYNGOPLASTY     Patient Active Problem List   Diagnosis Date Noted   Arthritis of right knee    S/P total knee arthroplasty, right 04/21/2021   Chest discomfort 07/28/2016   Controlled type 2 diabetes mellitus without complication ([place]) 0000000   Bilateral shoulder pain 10/31/2013   Erectile dysfunction 04/18/2013   Mixed hyperlipidemia 11/26/2009   LIMB PAIN 07/23/2009   GERD 04/29/2009   Obesity  10/22/2008   OBSTRUCTIVE SLEEP APNEA 10/22/2008   Essential hypertension 10/22/2008   CARDIOMYOPATHY 10/22/2008   CONGESTIVE HEART FAILURE, MILD [id number]   Chronic systolic heart failure ([place]) 10/16/2008    REFERRING DIAG: [id number] (ICD-10-CM) - Post-operative state Z96.659 (ICD-10-CM) - S/P TKR (total knee replacement)   ONSET DATE: 04/21/2021  THERAPY DIAG:  Difficulty in walking, not elsewhere classified  Localized edema  Stiffness of right knee, not elsewhere classified  Right knee pain, unspecified chronicity  Muscle weakness (generalized)  PERTINENT HISTORY: Previous L hip fracture, cardiac history, DM and previous stroke.  PRECAUTIONS: Knee  SUBJECTIVE: He has been doing his HEP.  He uses [facility] for 1 hour 2x/day.    PAIN:  Are you having pain? Yes NPRS scale: 6/10 since PT eval lowest 2-3/10 and 8/10 Pain location: knee Pain orientation: Right, Medial, Lateral, and Anterior  PAIN TYPE: surgical Pain description: constant, sharp, aching, and sore   Aggravating factors: laying down Relieving factors: mobile  PATIENT GOALS Return to work, be able to walk normally and do stairs without pain.     OBJECTIVE:    DIAGNOSTIC FINDINGS: [place] has increased R knee edema, limited R knee AROM, poor quadriceps strength and gait impairments (B axillary crutches) in need of skilled care s/p R TKA.   PATIENT  SURVEYS:  05/07/2021 FOTO 49 (Goal 68 in 14 visits)   COGNITION:          Overall cognitive status: Within functional limits for tasks assessed                        LE AROM/PROM:   A/PROM Right 05/07/2021 Left 05/07/2021 RLE 05/13/21  Hip flexion       Hip extension       Hip abduction       Hip adduction       Hip internal rotation       Hip external rotation       Knee flexion 74 122 PROM 96* Seated after man  Knee extension -8 0 PROM -4* Supine after man  Ankle dorsiflexion       Ankle plantarflexion       Ankle inversion       Ankle eversion         (Blank rows = not tested)   LE MMT:   MMT Deferred due to edema and time constraints Right 05/07/2021 Left 05/07/2021  Hip flexion      Hip extension      Hip abduction      Hip adduction      Hip internal rotation      Hip external rotation      Knee flexion      Knee extension      Ankle dorsiflexion      Ankle plantarflexion      Ankle inversion      Ankle eversion       (Blank rows = not tested)     TODAY'S TREATMENT: 05/13/2021  Therapeutic Exercise: Aerobic:  Nustep seat 10 level 5 with BLEs & BUEs for 8 min Supine:  RLE SLR 0# 10reps 2 sets quad set prior to ea rep  Heel slides with foot on 55cm ball with strap 10reps 2 sets 5"hold Prone: Seated:   Heel Slides max flex & ext with foot on pillow 15 reps Lt LAQs 0# 15 reps 2 sets Active Hamstring stretch during LAQ rest LE ext with strap DF 30" hold 2 reps  Standing: Neuromuscular Re-education: Manual Therapy:  Grade 3-4 Rt knee flex with traction, tibia IR & LLE LAQ  and ext supine with femoral IR Scar mobilizations to decrease adherance & increase tensile strength Therapeutic Activity: Self Care: Trigger Point Dry Needling:  Modalities:  Vaso Lt knee medium 34* 10 minutes  05/07/2021 Treatment Access Code: [email or web address] URL: [email or web address] Date: 05/07/2021 Prepared by: Vista Mink   Exercises Supine Quadricep Sets - 3-5 x daily - 7 x weekly - 2-3 sets - 10 reps - 5 second hold Seated Knee Flexion AAROM - 3 x daily - 7 x weekly - 1 sets - 1 reps - 3 minutes hold       PATIENT EDUCATION:  Education details: Reviewed exam findings, emphasis on edema control, AROM and quadriceps activation. Person educated: Patient Education method: Explanation, Demonstration, Tactile cues, Verbal cues, and Handouts Education comprehension: verbalized understanding, returned demonstration, verbal cues required, tactile cues required, and needs further education     HOME EXERCISE PROGRAM: Access Code:  [email or web address] URL: [email or web address] Date: 05/07/2021 Prepared by: Vista Mink   Exercises Supine Quadricep Sets - 3-5 x daily - 7 x weekly - 2-3 sets - 10 reps - 5 second hold Seated Knee Flexion AAROM - 3 x daily - 7 x weekly - 1 sets - 1  reps - 3 minutes hold     ASSESSMENT:   CLINICAL IMPRESSION: Patient's range improved with manual therapy and exercise.  Pain, edema & weakness continue to limit his knee mobility & function.  Patient continues to benefit from skilled PT to address his deficits.  REHAB POTENTIAL: Good   CLINICAL DECISION MAKING: Stable/uncomplicated   EVALUATION COMPLEXITY: Low     GOALS: Goals reviewed with patient? Yes   SHORT TERM GOALS:   STG Name Target Date Goal status  1 [place] will have R knee AROM at -4 to 90 degrees. Baseline: -8 to 74 degrees 06/04/2021 INITIAL    LONG TERM GOALS:    LTG Name Target Date Goal status  1 Improve FOTO to 68 in 14 visits. Baseline: 49 07/02/2021 INITIAL  2 Improve R knee pain to 0-4/10 (0-3/10 long-term) on the VAS. Baseline: 7/10 07/02/2021 INITIAL  3 Improve R knee AROM to 0-110 degrees. Baseline: -8 to 74 degrees 07/02/2021 INITIAL  4 Improve R quadriceps strength as assessed by objective testing and functional scores. Baseline: Using crutches, FOTO 49, objective test deferred due to time and edema 07/02/2021 INITIAL  5 K[place] will be independent with his long-term HEP at [place]. Baseline: 07/02/2021 INITIAL    PLAN: PT FREQUENCY:  1-3X/week starting at 2-3X/week   PT DURATION: 8 weeks   PLANNED INTERVENTIONS: Therapeutic exercises, Therapeutic activity, Neuro Muscular re-education, Balance training, Gait training, Patient/Family education, Stair training, Cryotherapy, Vasopneumatic device, and Manual therapy   PLAN FOR NEXT SESSION  Manual therapy & therapeutic exercise to increase PROM, AROM and function.  Add leg press.      Jamey Reas, PT, DPT 05/13/2021, 10:48 AM

## 2021-05-14 NOTE — Discharge Summary (Signed)
Physician Discharge Summary      Patient ID: Caleb Garcia MRN: [id number] DOB/AGE: 07/16/1965 56 y.o.  Admit date: 04/21/2021 Discharge date: 04/23/21  Admission Diagnoses:  Principal Problem:   S/P total knee arthroplasty, right Active Problems:   Arthritis of right knee   Discharge Diagnoses:  Same  Surgeries: Procedure(s): RIGHT TOTAL KNEE ARTHROPLASTY on 04/21/2021   Consultants:   Discharged Condition: Stable  Hospital Course: Caleb Garcia is an 56 y.o. male who was admitted 04/21/2021 with a chief complaint of right knee pain, and found to have a diagnosis of right knee osteoarthritis.  They were brought to the operating room on 04/21/2021 and underwent the above named procedures.  Pt awoke from anesthesia without complication and was transferred to the floor. On POD1, patient's pain was moderate but controlled.  No sign or symptom of DVT/PE.  He performed well with physical therapy but still required IV pain medication on POD 2.  He was weaned off of IV pain medication with trial of oral only opioid medication and did well with this so he was discharged home on POD 2..  Pt will f/u with Dr. August Saucer in clinic in ~2 weeks.   Antibiotics given:  Anti-infectives (From admission, onward)    Start     Dose/Rate Route Frequency Ordered Stop   04/21/21 1400  ceFAZolin (ANCEF) IVPB 2g/100 mL premix        2 g 200 mL/hr over 30 Minutes Intravenous Every 8 hours 04/21/21 1219 04/21/21 2119   04/21/21 0830  vancomycin (VANCOCIN) powder  Status:  Discontinued          As needed 04/21/21 0830 04/21/21 1039   04/21/21 0600  ceFAZolin (ANCEF) IVPB 2g/100 mL premix        2 g 200 mL/hr over 30 Minutes Intravenous On call to O.R. 04/21/21 2831 04/21/21 5176     .  Recent vital signs:  Vitals:   04/23/21 0230 04/23/21 0718  BP: (!) 163/80 (!) 148/81  Pulse: 91 95  Resp: 16 18  Temp: 99.1 F (37.3 C) 99.3 F (37.4 C)  SpO2: 98% 100%    Recent laboratory studies:  Results  for orders placed or performed during the hospital encounter of 04/21/21  Glucose, capillary  Result Value Ref Range   Glucose-Capillary 146 (H) 70 - 99 mg/dL  Glucose, capillary  Result Value Ref Range   Glucose-Capillary 159 (H) 70 - 99 mg/dL  Basic metabolic panel  Result Value Ref Range   Sodium 136 135 - 145 mmol/L   Potassium 3.9 3.5 - 5.1 mmol/L   Chloride 102 98 - 111 mmol/L   CO2 25 22 - 32 mmol/L   Glucose, Bld 152 (H) 70 - 99 mg/dL   BUN 10 6 - 20 mg/dL   Creatinine, Ser 1.60 0.61 - 1.24 mg/dL   Calcium 9.0 8.9 - 73.7 mg/dL   GFR, Estimated >10 >62 mL/min   Anion gap 9 5 - 15  Glucose, capillary  Result Value Ref Range   Glucose-Capillary 156 (H) 70 - 99 mg/dL  CBC  Result Value Ref Range   WBC 10.4 4.0 - 10.5 K/uL   RBC 3.84 (L) 4.22 - 5.81 MIL/uL   Hemoglobin 12.0 (L) 13.0 - 17.0 g/dL   HCT 69.4 (L) 85.4 - 62.7 %   MCV 92.2 80.0 - 100.0 fL   MCH 31.3 26.0 - 34.0 pg   MCHC 33.9 30.0 - 36.0 g/dL   RDW 03.5 00.9 - 38.1 %  Platelets 196 150 - 400 K/uL   nRBC 0.0 0.0 - 0.2 %  Glucose, capillary  Result Value Ref Range   Glucose-Capillary 234 (H) 70 - 99 mg/dL  Glucose, capillary  Result Value Ref Range   Glucose-Capillary 168 (H) 70 - 99 mg/dL  Glucose, capillary  Result Value Ref Range   Glucose-Capillary 154 (H) 70 - 99 mg/dL  Glucose, capillary  Result Value Ref Range   Glucose-Capillary 147 (H) 70 - 99 mg/dL  Glucose, capillary  Result Value Ref Range   Glucose-Capillary 144 (H) 70 - 99 mg/dL  CBC  Result Value Ref Range   WBC 10.6 (H) 4.0 - 10.5 K/uL   RBC 3.66 (L) 4.22 - 5.81 MIL/uL   Hemoglobin 11.3 (L) 13.0 - 17.0 g/dL   HCT 40.9 (L) 81.1 - 91.4 %   MCV 91.3 80.0 - 100.0 fL   MCH 30.9 26.0 - 34.0 pg   MCHC 33.8 30.0 - 36.0 g/dL   RDW 78.2 95.6 - 21.3 %   Platelets 165 150 - 400 K/uL   nRBC 0.0 0.0 - 0.2 %  Glucose, capillary  Result Value Ref Range   Glucose-Capillary 142 (H) 70 - 99 mg/dL  Glucose, capillary  Result Value Ref Range    Glucose-Capillary 173 (H) 70 - 99 mg/dL  Glucose, capillary  Result Value Ref Range   Glucose-Capillary 147 (H) 70 - 99 mg/dL  Glucose, capillary  Result Value Ref Range   Glucose-Capillary 114 (H) 70 - 99 mg/dL    Discharge Medications:   Allergies as of 04/23/2021       Reactions   Other    Almonds-caused swelling in hands   Shrimp [shellfish Allergy] Swelling        Medication List     TAKE these medications    acetaminophen 500 MG tablet Commonly known as: TYLENOL Take 500 mg by mouth every 6 (six) hours as needed for mild pain.   aspirin 81 MG tablet Take 81 mg by mouth daily. What changed: Another medication with the same name was added. Make sure you understand how and when to take each.   aspirin 81 MG chewable tablet Chew 1 tablet (81 mg total) by mouth 2 (two) times daily. What changed: You were already taking a medication with the same name, and this prescription was added. Make sure you understand how and when to take each.   atorvastatin 20 MG tablet Commonly known as: LIPITOR Take 1 tablet (20 mg total) by mouth daily.   carvedilol 25 MG tablet Commonly known as: COREG Take 1 tablet (25 mg total) by mouth 2 (two) times daily with a meal.   CORICIDIN HBP COUGH/COLD PO Take 1 tablet by mouth every 12 (twelve) hours as needed (cold symptoms).   docusate sodium 100 MG capsule Commonly known as: COLACE Take 1 capsule (100 mg total) by mouth 2 (two) times daily.   felodipine 2.5 MG 24 hr tablet Commonly known as: PLENDIL Take 1 tablet (2.5 mg total) by mouth daily.   furosemide 40 MG tablet Commonly known as: LASIX Take 1 tablet (40 mg total) by mouth daily.   hydrALAZINE 25 MG tablet Commonly known as: APRESOLINE Take 1 tablet (25 mg total) by mouth 3 (three) times daily.   lisinopril 40 MG tablet Commonly known as: ZESTRIL Take 1 tablet (40 mg total) by mouth daily.   metFORMIN 500 MG tablet Commonly known as: GLUCOPHAGE Take 1 tablet  (500 mg total) by mouth 2 (two)  times daily with a meal.   potassium chloride 10 MEQ tablet Commonly known as: KLOR-CON M Take 10 mEq by mouth daily.   potassium chloride 10 MEQ tablet Commonly known as: KLOR-CON M Take 1 tablet by mouth once daily   spironolactone 25 MG tablet Commonly known as: ALDACTONE Take 2 tablets (50 mg total) by mouth daily.   tadalafil 20 MG tablet Commonly known as: Cialis Take 1 tablet (20 mg total) by mouth daily as needed for erectile dysfunction.        Diagnostic Studies: XR Knee 1-2 Views Right  Result Date: 05/08/2021 AP and lateral views of right knee reviewed.  Right total knee press-fit prosthesis in good position and alignment without any complicating features.  No periprosthetic lucency or fracture.   Disposition: Discharge disposition: 01-Home or Self Care       Discharge Instructions     Call MD / Call 911   Complete by: As directed    If you experience chest pain or shortness of breath, CALL 911 and be transported to the hospital emergency room.  If you develope a fever above 101 F, pus (white drainage) or increased drainage or redness at the wound, or calf pain, call your surgeon's office.   Constipation Prevention   Complete by: As directed    Drink plenty of fluids.  Prune juice may be helpful.  You may use a stool softener, such as Colace (over the counter) 100 mg twice a day.  Use MiraLax (over the counter) for constipation as needed.   Diet - low sodium heart healthy   Complete by: As directed    Discharge instructions   Complete by: As directed    You may shower, dressing is waterproof.  Do not remove the dressing, we will remove it at your first post-op appointment.  Do not take a bath or soak the knee in a tub or pool.  You may weightbear as you can tolerate on the operative leg with a walker.  Continue using the CPM machine 3 times per day for one hour each time, increasing the degrees of range of motion daily.  Use the  blue cradle boot under your heel to work on getting your leg straight.  Do NOT put a pillow under your knee.  You will follow-up with Dr. August Saucer in the clinic in 2 weeks at your given appointment date.  Call the office at [phone] with any questions or concerns.    INSTRUCTIONS AFTER JOINT REPLACEMENT   Remove items at home which could result in a fall. This includes throw rugs or furniture in walking pathways ICE to the affected joint every three hours while awake for 30 minutes at a time, for at least the first 3-5 days, and then as needed for pain and swelling.  Continue to use ice for pain and swelling. You may notice swelling that will progress down to the foot and ankle.  This is normal after surgery.  Elevate your leg when you are not up walking on it.   Continue to use the breathing machine you got in the hospital (incentive spirometer) which will help keep your temperature down.  It is common for your temperature to cycle up and down following surgery, especially at night when you are not up moving around and exerting yourself.  The breathing machine keeps your lungs expanded and your temperature down.   DIET:  As you were doing prior to hospitalization, we recommend a well-balanced diet.  DRESSING /  WOUND CARE / SHOWERING  Keep the surgical dressing until follow up.  The dressing is water proof, so you can shower without any extra covering.  IF THE DRESSING FALLS OFF or the wound gets wet inside, change the dressing with sterile gauze.  Please use good hand washing techniques before changing the dressing.  Do not use any lotions or creams on the incision until instructed by your surgeon.    ACTIVITY  Increase activity slowly as tolerated, but follow the weight bearing instructions below.   No driving for 6 weeks or until further direction given by your physician.  You cannot drive while taking narcotics.  No lifting or carrying greater than 10 lbs. until further directed by your  surgeon. Avoid periods of inactivity such as sitting longer than an hour when not asleep. This helps prevent blood clots.  You may return to work once you are authorized by your doctor.     WEIGHT BEARING   Weight bearing as tolerated with assist device (walker, cane, etc) as directed, use it as long as suggested by your surgeon or therapist, typically at least 4-6 weeks.   EXERCISES  Results after joint replacement surgery are often greatly improved when you follow the exercise, range of motion and muscle strengthening exercises prescribed by your doctor. Safety measures are also important to protect the joint from further injury. Any time any of these exercises cause you to have increased pain or swelling, decrease what you are doing until you are comfortable again and then slowly increase them. If you have problems or questions, call your caregiver or physical therapist for advice.   Rehabilitation is important following a joint replacement. After just a few days of immobilization, the muscles of the leg can become weakened and shrink (atrophy).  These exercises are designed to build up the tone and strength of the thigh and leg muscles and to improve motion. Often times heat used for twenty to thirty minutes before working out will loosen up your tissues and help with improving the range of motion but do not use heat for the first two weeks following surgery (sometimes heat can increase post-operative swelling).   These exercises can be done on a training (exercise) mat, on the floor, on a table or on a bed. Use whatever works the best and is most comfortable for you.    Use music or television while you are exercising so that the exercises are a pleasant break in your day. This will make your life better with the exercises acting as a break in your routine that you can look forward to.   Perform all exercises about fifteen times, three times per day or as directed.  You should exercise both the  operative leg and the other leg as well.  Exercises include:   Quad Sets - Tighten up the muscle on the front of the thigh (Quad) and hold for 5-10 seconds.   Straight Leg Raises - With your knee straight (if you were given a brace, keep it on), lift the leg to 60 degrees, hold for 3 seconds, and slowly lower the leg.  Perform this exercise against resistance later as your leg gets stronger.  Leg Slides: Lying on your back, slowly slide your foot toward your buttocks, bending your knee up off the floor (only go as far as is comfortable). Then slowly slide your foot back down until your leg is flat on the floor again.  Angel Wings: Lying on your back spread your  legs to the side as far apart as you can without causing discomfort.  Hamstring Strength:  Lying on your back, push your heel against the floor with your leg straight by tightening up the muscles of your buttocks.  Repeat, but this time bend your knee to a comfortable angle, and push your heel against the floor.  You may put a pillow under the heel to make it more comfortable if necessary.   A rehabilitation program following joint replacement surgery can speed recovery and prevent re-injury in the future due to weakened muscles. Contact your doctor or a physical therapist for more information on knee rehabilitation.    CONSTIPATION  Constipation is defined medically as fewer than three stools per week and severe constipation as less than one stool per week.  Even if you have a regular bowel pattern at home, your normal regimen is likely to be disrupted due to multiple reasons following surgery.  Combination of anesthesia, postoperative narcotics, change in appetite and fluid intake all can affect your bowels.   YOU MUST use at least one of the following options; they are listed in order of increasing strength to get the job done.  They are all available over the counter, and you may need to use some, POSSIBLY even all of these options:     Drink plenty of fluids (prune juice may be helpful) and high fiber foods Colace 100 mg by mouth twice a day  Senokot for constipation as directed and as needed Dulcolax (bisacodyl), take with full glass of water  Miralax (polyethylene glycol) once or twice a day as needed.  If you have tried all these things and are unable to have a bowel movement in the first 3-4 days after surgery call either your surgeon or your primary doctor.    If you experience loose stools or diarrhea, hold the medications until you stool forms back up.  If your symptoms do not get better within 1 week or if they get worse, check with your doctor.  If you experience "the worst abdominal pain ever" or develop nausea or vomiting, please contact the office immediately for further recommendations for treatment.   ITCHING:  If you experience itching with your medications, try taking only a single pain pill, or even half a pain pill at a time.  You can also use Benadryl over the counter for itching or also to help with sleep.   TED HOSE STOCKINGS:  Use stockings on both legs until for at least 2 weeks or as directed by physician office. They may be removed at night for sleeping.  MEDICATIONS:  See your medication summary on the "After Visit Summary" that nursing will review with you.  You may have some home medications which will be placed on hold until you complete the course of blood thinner medication.  It is important for you to complete the blood thinner medication as prescribed.  PRECAUTIONS:  If you experience chest pain or shortness of breath - call 911 immediately for transfer to the hospital emergency department.   If you develop a fever greater that 101 F, purulent drainage from wound, increased redness or drainage from wound, foul odor from the wound/dressing, or calf pain - CONTACT YOUR SURGEON.  FOLLOW-UP APPOINTMENTS:  If you do not already have a post-op  appointment, please call the office for an appointment to be seen by your surgeon.  Guidelines for how soon to be seen are listed in your "After Visit Summary", but are typically between 1-4 weeks after surgery.  OTHER INSTRUCTIONS:   Knee Replacement:  Do not place pillow under knee, focus on keeping the knee straight while resting. CPM instructions: 0-90 degrees, 2 hours in the morning, 2 hours in the afternoon, and 2 hours in the evening. Place foam block, curve side up under heel at all times except when in CPM or when walking.  DO NOT modify, tear, cut, or change the foam block in any way.  POST-OPERATIVE OPIOID TAPER INSTRUCTIONS: It is important to wean off of your opioid medication as soon as possible. If you do not need pain medication after your surgery it is ok to stop day one. Opioids include: Codeine, Hydrocodone(Norco, Vicodin), Oxycodone(Percocet, oxycontin) and hydromorphone amongst others.  Long term and even short term use of opiods can cause: Increased pain response Dependence Constipation Depression Respiratory depression And more.  Withdrawal symptoms can include Flu like symptoms Nausea, vomiting And more Techniques to manage these symptoms Hydrate well Eat regular healthy meals Stay active Use relaxation techniques(deep breathing, meditating, yoga) Do Not substitute Alcohol to help with tapering If you have been on opioids for less than two weeks and do not have pain than it is ok to stop all together.  Plan to wean off of opioids This plan should start within one week post op of your joint replacement. Maintain the same interval or time between taking each dose and first decrease the dose.  Cut the total daily intake of opioids by one tablet each day Next start to increase the time between doses. The last dose that should be eliminated is the evening dose.   MAKE SURE YOU:  Understand these instructions.  Get help right away if you are not doing well or  get worse.    Thank you for letting us be a part of your medical care team.  It is a privilege we respect greatly.  We hope these instructions will help you stay on track for a fast and full recovery!    Dental Antibiotics:  In most cases prophylactic antibiotics for Dental procdeures after total joint surgery are not necessary.  Exceptions are as follows:  1. History of prior total joint infection  2. Severely immunocompromised (Organ Transplant, cancer chemotherapy, Rheumatoid biologic meds such as Humera)  3. Poorly controlled diabetes (A1C &gt; 8.0, blood glucose over 200)  If you have one of these conditions, contact your surgeon for an antibiotic prescription, prior to your dental procedure.   Increase activity slowly as tolerated   Complete by: As directed    Post-operative opioid taper instructions:   Complete by: As directed    POST-OPERATIVE OPIOID TAPER INSTRUCTIONS: It is important to wean off of your opioid medication as soon as possible. If you do not need pain medication after your surgery it is ok to stop day one. Opioids include: Codeine, Hydrocodone(Norco, Vicodin), Oxycodone(Percocet, oxycontin) and hydromorphone amongst others.  Long term and even short term use of opiods can cause: Increased pain response Dependence Constipation Depression Respiratory depression And more.  Withdrawal symptoms can include Flu like symptoms Nausea, vomiting And more Techniques to manage these symptoms Hydrate well Eat regular healthy meals Stay active Use relaxation techniques(deep breathing, meditating, yoga) Do Not substitute Alcohol to  help with tapering If you have been on opioids for less than two weeks and do not have pain than it is ok to stop all together.  Plan to wean off of opioids This plan should start within one week post op of your joint replacement. Maintain the same interval or time between taking each dose and first decrease the dose.  Cut the  total daily intake of opioids by one tablet each day Next start to increase the time between doses. The last dose that should be eliminated is the evening dose.           Follow-up Information     Nafziger, Kandee Keen, NP Follow up.   Specialty: Family Medicine Contact information: [address] [place] [address] [phone]                  Signed: Julieanne Cotton 05/14/2021, 9:25 AM

## 2021-05-15 ENCOUNTER — Telehealth: Payer: Self-pay | Admitting: Orthopedic Surgery

## 2021-05-15 ENCOUNTER — Other Ambulatory Visit: Payer: Self-pay | Admitting: Physician Assistant

## 2021-05-15 ENCOUNTER — Encounter: Payer: Self-pay | Admitting: Rehabilitative and Restorative Service Providers"

## 2021-05-15 ENCOUNTER — Other Ambulatory Visit: Payer: Self-pay

## 2021-05-15 ENCOUNTER — Other Ambulatory Visit: Payer: Self-pay | Admitting: Surgical

## 2021-05-15 ENCOUNTER — Ambulatory Visit (INDEPENDENT_AMBULATORY_CARE_PROVIDER_SITE_OTHER): Payer: BC Managed Care – PPO | Admitting: Rehabilitative and Restorative Service Providers"

## 2021-05-15 DIAGNOSIS — M6281 Muscle weakness (generalized): Secondary | ICD-10-CM

## 2021-05-15 DIAGNOSIS — M25661 Stiffness of right knee, not elsewhere classified: Secondary | ICD-10-CM

## 2021-05-15 DIAGNOSIS — R262 Difficulty in walking, not elsewhere classified: Secondary | ICD-10-CM | POA: Diagnosis not present

## 2021-05-15 DIAGNOSIS — M25561 Pain in right knee: Secondary | ICD-10-CM | POA: Diagnosis not present

## 2021-05-15 DIAGNOSIS — R600 Localized edema: Secondary | ICD-10-CM

## 2021-05-15 MED ORDER — OXYCODONE HCL 5 MG PO TABS: 5.0000 mg | ORAL_TABLET | Freq: Four times a day (QID) | ORAL | 0 refills | Status: DC | PRN

## 2021-05-15 NOTE — Therapy (Signed)
OUTPATIENT PHYSICAL THERAPY TREATMENT NOTE   Patient Name: Caleb Garcia MRN: [id number] DOB:[birth year], 56 y.o., male Today's Date: 05/15/2021  PCP: Dorothyann Peng, NP REFERRING PROVIDER: Meredith Pel, MD   PT End of Session - 05/15/21 1522     Visit Number 3    Number of Visits 18    Date for PT Re-Evaluation 07/02/21    Authorization - Number of Visits 24    PT Start Time 1430    PT Stop Time 1518    PT Time Calculation (min) 48 min    Activity Tolerance Patient tolerated treatment well    Behavior During Therapy [facility] for tasks assessed/performed              Past Medical History:  Diagnosis Date   Arthritis    Cardiomyopathy    Non-ischemic   CHF (congestive heart failure) ([place])    Diabetes mellitus without complication ([place])    HTN (hypertension)    Hyperlipidemia    Hypokalemia    Obesity    OSA (obstructive sleep apnea)    Sleep apnea    had surgery- no cpap   Stroke ([place]) 93/5701   Systolic CHF [facility])    Past Surgical History:  Procedure Laterality Date   avulsion fracture of left hip     CARDIAC CATHETERIZATION  10/08/08   LEFT HEART CATH AND CORONARY ANGIOGRAPHY N/A 08/03/2016   Procedure: Left Heart Cath and Coronary Angiography;  Surgeon: Jolaine Artist, MD;  Location: [place] [facility];  Service: Cardiovascular;  Laterality: N/A;   TONSILLECTOMY     TOTAL KNEE ARTHROPLASTY Right 04/21/2021   Procedure: RIGHT TOTAL KNEE ARTHROPLASTY;  Surgeon: Meredith Pel, MD;  Location: [place];  Service: Orthopedics;  Laterality: Right;   UVULOPALATOPHARYNGOPLASTY     Patient Active Problem List   Diagnosis Date Noted   Arthritis of right knee    S/P total knee arthroplasty, right 04/21/2021   Chest discomfort 07/28/2016   Controlled type 2 diabetes mellitus without complication ([place]) 77/93/9030   Bilateral shoulder pain 10/31/2013   Erectile dysfunction 04/18/2013   Mixed hyperlipidemia 11/26/2009   LIMB PAIN 07/23/2009   GERD 04/29/2009    Obesity 10/22/2008   OBSTRUCTIVE SLEEP APNEA 10/22/2008   Essential hypertension 10/22/2008   CARDIOMYOPATHY 10/22/2008   CONGESTIVE HEART FAILURE, MILD 12/26/3005   Chronic systolic heart failure ([place]) 10/16/2008    REFERRING DIAG: M22.633 (ICD-10-CM) - Post-operative state Z96.659 (ICD-10-CM) - S/P TKR (total knee replacement)   ONSET DATE: 04/21/2021  THERAPY DIAG:  Difficulty in walking, not elsewhere classified  Stiffness of right knee, not elsewhere classified  Right knee pain, unspecified chronicity  Localized edema  Muscle weakness (generalized)  PERTINENT HISTORY: Previous L hip fracture, cardiac history, DM and previous stroke.  PRECAUTIONS: Knee  SUBJECTIVE: Waddell reports excellent HEP compliance.   PAIN:  Are you having pain? Yes NPRS scale: 4/10 today, lowest 2-3/10 and highest 8/10 this week Pain location: knee Pain orientation: Right, Medial, Lateral, and Anterior  PAIN TYPE: surgical Pain description: constant, sharp, aching, and sore   Aggravating factors: prolonged postures and too much WB Relieving factors: exercises and ice  PATIENT GOALS Return to work, be able to walk normally and do stairs without pain.     OBJECTIVE:    DIAGNOSTIC FINDINGS: Steffon has R knee edema, limited R knee AROM, poor quadriceps strength and gait impairments (B axillary crutches) in need of skilled care s/p R TKA.   PATIENT SURVEYS:  05/07/2021  FOTO 49 (Goal 68 in 14 visits)   COGNITION:          Overall cognitive status: Within functional limits for tasks assessed                        LE AROM/PROM:   A/PROM Right 05/07/2021 Left 05/07/2021 RLE 05/13/21  Hip flexion       Hip extension       Hip abduction       Hip adduction       Hip internal rotation       Hip external rotation       Knee flexion 74 122 PROM 96* Seated after man  Knee extension -8 0 PROM -4* Supine after man  Ankle dorsiflexion       Ankle plantarflexion       Ankle inversion        Ankle eversion        (Blank rows = not tested)   LE MMT:   MMT Deferred due to edema and time constraints Right 05/07/2021 Left 05/07/2021  Hip flexion      Hip extension      Hip abduction      Hip adduction      Hip internal rotation      Hip external rotation      Knee flexion      Knee extension      Ankle dorsiflexion      Ankle plantarflexion      Ankle inversion      Ankle eversion       (Blank rows = not tested)     TODAY'S TREATMENT: 05/15/2021 Therapeutic Exercise: Aerobic:  Recumbent bike Seat 10 4 minutes AAROM to full range and Seat 9 AAROM to full range 4 minutes Supine: Quadriceps sets 2 sets of 10 for 5 seconds (R heel on towel roll) Prone: Seated: Tailgate knee flexion 3 minutes; AAROM (L pushes R into flexion) 10X 10 seconds; knee extension machine 90-40 degrees 15# 5X slow eccentrics  Standing: Neuromuscular Re-education: Manual Therapy:  Therapeutic Activity: FUNCTIONAL ACTIVITY leg press single-leg R only 15X 50# push into extension (3 seconds) and stretch into flexion (3 seconds) for sit to stand, stairs, curbs Self Care: Trigger Point Dry Needling:  Modalities:  Vaso R knee medium 34* 10 minutes  05/13/2021  Therapeutic Exercise: Aerobic:  Nustep seat 10 level 5 with BLEs & BUEs for 8 min Supine:  RLE SLR 0# 10reps 2 sets quad set prior to ea rep  Heel slides with foot on 55cm ball with strap 10reps 2 sets 5"hold Prone: Seated:   Heel Slides max flex & ext with foot on pillow 15 reps Lt LAQs 0# 15 reps 2 sets Active Hamstring stretch during LAQ rest LE ext with strap DF 30" hold 2 reps  Standing: Neuromuscular Re-education: Manual Therapy:  Grade 3-4 Rt knee flex with traction, tibia IR & LLE LAQ  and ext supine with femoral IR Scar mobilizations to decrease adherance & increase tensile strength Therapeutic Activity: Self Care: Trigger Point Dry Needling:  Modalities:  Vaso Lt knee medium 34* 10 minutes  05/07/2021 Treatment Access Code:  [email or web address] URL: [email or web address] Date: 05/07/2021 Prepared by: Vista Mink   Exercises Supine Quadricep Sets - 3-5 x daily - 7 x weekly - 2-3 sets - 10 reps - 5 second hold Seated Knee Flexion AAROM - 3 x daily - 7 x weekly - 1 sets - 1 reps -  3 minutes hold       PATIENT EDUCATION:  Education details: Reviewed HEP, emphasis remains edema control, AROM and quadriceps activation. Person educated: Patient Education method: Explanation, Demonstration, Tactile cues, Verbal cues, and Handouts Education comprehension: verbalized understanding, returned demonstration, verbal cues required, tactile cues required, and needs further education     HOME EXERCISE PROGRAM: Access Code: [email or web address] URL: [email or web address] Date: 05/07/2021 Prepared by: Vista Mink   Exercises Supine Quadricep Sets - 3-5 x daily - 7 x weekly - 2-3 sets - 10 reps - 5 second hold Seated Knee Flexion AAROM - 3 x daily - 7 x weekly - 1 sets - 1 reps - 3 minutes hold     ASSESSMENT:   CLINICAL IMPRESSION: Granite is making good early progress with his post-surgical PT.  AROM is improving and he reports great early HEP compliance.  Continue edema control, quadriceps strength and AROM work to meet long-term goals. REHAB POTENTIAL: Good   CLINICAL DECISION MAKING: Stable/uncomplicated   EVALUATION COMPLEXITY: Low     GOALS: Goals reviewed with patient? Yes   SHORT TERM GOALS:   STG Name Target Date Goal status  1 Roshun will have R knee AROM at -4 to 90 degrees. Baseline: -2 to 92 degrees (was -8 to 74 degrees) 06/04/2021 Met    LONG TERM GOALS:    LTG Name Target Date Goal status  1 Improve FOTO to 68 in 14 visits. Baseline: 49 07/02/2021 INITIAL  2 Improve R knee pain to 0-4/10 (0-3/10 long-term) on the VAS. Baseline: 7/10 07/02/2021 INITIAL  3 Improve R knee AROM to 0-110 degrees. Baseline: -8 to 74 degrees 07/02/2021 INITIAL  4 Improve R quadriceps strength as  assessed by objective testing and functional scores. Baseline: Using crutches, FOTO 49, objective test deferred due to time and edema 07/02/2021 INITIAL  5 Davonn will be independent with his long-term HEP at DC. Baseline: 07/02/2021 INITIAL    PLAN: PT FREQUENCY:  1-3X/week starting at 2-3X/week   PT DURATION: 8 weeks   PLANNED INTERVENTIONS: Therapeutic exercises, Therapeutic activity, Neuro Muscular re-education, Balance training, Gait training, Patient/Family education, Stair training, Cryotherapy, Vasopneumatic device, and Manual therapy   PLAN FOR NEXT SESSION  Quadriceps strength, balance, progress to functional activities as needed    Farley Ly, PT, MPT 05/15/2021, 3:23 PM

## 2021-05-15 NOTE — Telephone Encounter (Signed)
Sent in

## 2021-05-15 NOTE — Telephone Encounter (Signed)
Patient called. He would like a refill on Oxycodone called in for him. His call back number is [phone]

## 2021-05-15 NOTE — Telephone Encounter (Signed)
Pt advised.

## 2021-05-18 ENCOUNTER — Encounter: Payer: BC Managed Care – PPO | Admitting: Physical Therapy

## 2021-05-18 NOTE — Therapy (Incomplete)
OUTPATIENT PHYSICAL THERAPY TREATMENT NOTE   Patient Name: Caleb Garcia MRN: [id number] DOB:[birth year], 56 y.o., male Today's Date: 05/18/2021  PCP: Dorothyann Peng, NP REFERRING PROVIDER: Meredith Pel, MD      Past Medical History:  Diagnosis Date   Arthritis    Cardiomyopathy    Non-ischemic   CHF (congestive heart failure) ([place])    Diabetes mellitus without complication ([place])    HTN (hypertension)    Hyperlipidemia    Hypokalemia    Obesity    OSA (obstructive sleep apnea)    Sleep apnea    had surgery- no cpap   Stroke ([place]) 57/3220   Systolic CHF [facility])    Past Surgical History:  Procedure Laterality Date   avulsion fracture of left hip     CARDIAC CATHETERIZATION  10/08/08   LEFT HEART CATH AND CORONARY ANGIOGRAPHY N/A 08/03/2016   Procedure: Left Heart Cath and Coronary Angiography;  Surgeon: Jolaine Artist, MD;  Location: [place] [facility];  Service: Cardiovascular;  Laterality: N/A;   TONSILLECTOMY     TOTAL KNEE ARTHROPLASTY Right 04/21/2021   Procedure: RIGHT TOTAL KNEE ARTHROPLASTY;  Surgeon: Meredith Pel, MD;  Location: [place];  Service: Orthopedics;  Laterality: Right;   UVULOPALATOPHARYNGOPLASTY     Patient Active Problem List   Diagnosis Date Noted   Arthritis of right knee    S/P total knee arthroplasty, right 04/21/2021   Chest discomfort 07/28/2016   Controlled type 2 diabetes mellitus without complication ([place]) 25/42/7062   Bilateral shoulder pain 10/31/2013   Erectile dysfunction 04/18/2013   Mixed hyperlipidemia 11/26/2009   LIMB PAIN 07/23/2009   GERD 04/29/2009   Obesity 10/22/2008   OBSTRUCTIVE SLEEP APNEA 10/22/2008   Essential hypertension 10/22/2008   CARDIOMYOPATHY 10/22/2008   CONGESTIVE HEART FAILURE, MILD 37/62/8315   Chronic systolic heart failure ([place]) 10/16/2008    REFERRING DIAG: V76.160 (ICD-10-CM) - Post-operative state Z96.659 (ICD-10-CM) - S/P TKR (total knee replacement)   ONSET DATE:  04/21/2021  THERAPY DIAG:  No diagnosis found.  PERTINENT HISTORY: Previous L hip fracture, cardiac history, DM and previous stroke.  PRECAUTIONS: Knee  SUBJECTIVE: ***   PAIN: *** Are you having pain? Yes NPRS scale: 4/10 today, lowest 2-3/10 and highest 8/10 this week Pain location: knee Pain orientation: Right, Medial, Lateral, and Anterior  PAIN TYPE: surgical Pain description: constant, sharp, aching, and sore   Aggravating factors: prolonged postures and too much WB Relieving factors: exercises and ice  PATIENT GOALS Return to work, be able to walk normally and do stairs without pain.     OBJECTIVE:    DIAGNOSTIC FINDINGS: Bernie has R knee edema, limited R knee AROM, poor quadriceps strength and gait impairments (B axillary crutches) in need of skilled care s/p R TKA.   PATIENT SURVEYS:  05/07/2021 FOTO 49 (Goal 68 in 14 visits)   COGNITION:          Overall cognitive status: Within functional limits for tasks assessed                        LE AROM/PROM:   A/PROM Right 05/07/2021 Left 05/07/2021 RLE 05/13/21  Hip flexion       Hip extension       Hip abduction       Hip adduction       Hip internal rotation       Hip external rotation       Knee flexion 74 122 PROM  96* Seated after man  Knee extension -8 0 PROM -4* Supine after man  Ankle dorsiflexion       Ankle plantarflexion       Ankle inversion       Ankle eversion        (Blank rows = not tested)   LE MMT:   MMT Deferred due to edema and time constraints Right 05/07/2021 Left 05/07/2021  Hip flexion      Hip extension      Hip abduction      Hip adduction      Hip internal rotation      Hip external rotation      Knee flexion      Knee extension      Ankle dorsiflexion      Ankle plantarflexion      Ankle inversion      Ankle eversion       (Blank rows = not tested)     TODAY'S TREATMENT: 05/18/2021   05/15/2021 Therapeutic Exercise: Aerobic:  Recumbent bike Seat 10 4 minutes AAROM to  full range and Seat 9 AAROM to full range 4 minutes Supine: Quadriceps sets 2 sets of 10 for 5 seconds (R heel on towel roll) Prone: Seated: Tailgate knee flexion 3 minutes; AAROM (L pushes R into flexion) 10X 10 seconds; knee extension machine 90-40 degrees 15# 5X slow eccentrics  Standing: Neuromuscular Re-education: Manual Therapy:  Therapeutic Activity: FUNCTIONAL ACTIVITY leg press single-leg R only 15X 50# push into extension (3 seconds) and stretch into flexion (3 seconds) for sit to stand, stairs, curbs Self Care: Trigger Point Dry Needling:  Modalities:  Vaso R knee medium 34* 10 minutes  05/13/2021  Therapeutic Exercise: Aerobic:  Nustep seat 10 level 5 with BLEs & BUEs for 8 min Supine:  RLE SLR 0# 10reps 2 sets quad set prior to ea rep  Heel slides with foot on 55cm ball with strap 10reps 2 sets 5"hold Prone: Seated:   Heel Slides max flex & ext with foot on pillow 15 reps Lt LAQs 0# 15 reps 2 sets Active Hamstring stretch during LAQ rest LE ext with strap DF 30" hold 2 reps  Standing: Neuromuscular Re-education: Manual Therapy:  Grade 3-4 Rt knee flex with traction, tibia IR & LLE LAQ  and ext supine with femoral IR Scar mobilizations to decrease adherance & increase tensile strength Therapeutic Activity: Self Care: Trigger Point Dry Needling:  Modalities:  Vaso Lt knee medium 34* 10 minutes  05/07/2021 Treatment Access Code: [email or web address] URL: [email or web address] Date: 05/07/2021 Prepared by: Vista Mink   Exercises Supine Quadricep Sets - 3-5 x daily - 7 x weekly - 2-3 sets - 10 reps - 5 second hold Seated Knee Flexion AAROM - 3 x daily - 7 x weekly - 1 sets - 1 reps - 3 minutes hold       PATIENT EDUCATION:  Education details: Reviewed HEP, emphasis remains edema control, AROM and quadriceps activation. Person educated: Patient Education method: Explanation, Demonstration, Tactile cues, Verbal cues, and Handouts Education comprehension:  verbalized understanding, returned demonstration, verbal cues required, tactile cues required, and needs further education     HOME EXERCISE PROGRAM: Access Code: [email or web address] URL: [email or web address] Date: 05/07/2021 Prepared by: Vista Mink   Exercises Supine Quadricep Sets - 3-5 x daily - 7 x weekly - 2-3 sets - 10 reps - 5 second hold Seated Knee Flexion AAROM - 3 x daily - 7 x weekly - 1 sets - 1 reps - 3  minutes hold     ASSESSMENT:   CLINICAL IMPRESSION: *** REHAB POTENTIAL: Good   CLINICAL DECISION MAKING: Stable/uncomplicated   EVALUATION COMPLEXITY: Low     GOALS: Goals reviewed with patient? Yes   SHORT TERM GOALS:   STG Name Target Date Goal status  1 Choice will have R knee AROM at -4 to 90 degrees. Baseline: -2 to 92 degrees (was -8 to 74 degrees) 06/04/2021 Met    LONG TERM GOALS:    LTG Name Target Date Goal status  1 Improve FOTO to 68 in 14 visits. Baseline: 49 07/02/2021 INITIAL  2 Improve R knee pain to 0-4/10 (0-3/10 long-term) on the VAS. Baseline: 7/10 07/02/2021 INITIAL  3 Improve R knee AROM to 0-110 degrees. Baseline: -8 to 74 degrees 07/02/2021 INITIAL  4 Improve R quadriceps strength as assessed by objective testing and functional scores. Baseline: Using crutches, FOTO 49, objective test deferred due to time and edema 07/02/2021 INITIAL  5 Jakeim will be independent with his long-term HEP at [place]. Baseline: 07/02/2021 INITIAL    PLAN: PT FREQUENCY:  1-3X/week starting at 2-3X/week   PT DURATION: 8 weeks   PLANNED INTERVENTIONS: Therapeutic exercises, Therapeutic activity, Neuro Muscular re-education, Balance training, Gait training, Patient/Family education, Stair training, Cryotherapy, Vasopneumatic device, and Manual therapy   PLAN FOR NEXT SESSION  ***Quadriceps strength, balance, progress to functional activities as needed    Jamey Reas, PT, MPT 05/18/2021, 7:46 AM

## 2021-05-20 ENCOUNTER — Other Ambulatory Visit: Payer: Self-pay

## 2021-05-20 ENCOUNTER — Ambulatory Visit (INDEPENDENT_AMBULATORY_CARE_PROVIDER_SITE_OTHER): Payer: BC Managed Care – PPO | Admitting: Orthopedic Surgery

## 2021-05-20 ENCOUNTER — Encounter: Payer: Self-pay | Admitting: Physical Therapy

## 2021-05-20 ENCOUNTER — Encounter: Payer: Self-pay | Admitting: Orthopedic Surgery

## 2021-05-20 ENCOUNTER — Ambulatory Visit (INDEPENDENT_AMBULATORY_CARE_PROVIDER_SITE_OTHER): Payer: BC Managed Care – PPO | Admitting: Physical Therapy

## 2021-05-20 DIAGNOSIS — R262 Difficulty in walking, not elsewhere classified: Secondary | ICD-10-CM | POA: Diagnosis not present

## 2021-05-20 DIAGNOSIS — M6281 Muscle weakness (generalized): Secondary | ICD-10-CM

## 2021-05-20 DIAGNOSIS — M25661 Stiffness of right knee, not elsewhere classified: Secondary | ICD-10-CM | POA: Diagnosis not present

## 2021-05-20 DIAGNOSIS — R600 Localized edema: Secondary | ICD-10-CM

## 2021-05-20 DIAGNOSIS — M25561 Pain in right knee: Secondary | ICD-10-CM

## 2021-05-20 DIAGNOSIS — M1711 Unilateral primary osteoarthritis, right knee: Secondary | ICD-10-CM

## 2021-05-20 MED ORDER — CYCLOBENZAPRINE HCL 5 MG PO TABS: 10.0000 mg | ORAL_TABLET | Freq: Three times a day (TID) | ORAL | 0 refills | Status: DC | PRN

## 2021-05-20 MED ORDER — GABAPENTIN 300 MG PO CAPS: 300.0000 mg | ORAL_CAPSULE | Freq: Three times a day (TID) | ORAL | 0 refills | Status: DC

## 2021-05-20 MED ORDER — HYDROCODONE-ACETAMINOPHEN 5-325 MG PO TABS: 1.0000 | ORAL_TABLET | Freq: Three times a day (TID) | ORAL | 0 refills | Status: DC | PRN

## 2021-05-20 NOTE — Therapy (Signed)
OUTPATIENT PHYSICAL THERAPY TREATMENT NOTE   Patient Name: Caleb Garcia MRN: [id number] DOB:Sep 01, 1965, 56 y.o., male Today's Date: 05/20/2021  PCP: Dorothyann Peng, NP REFERRING PROVIDER: Meredith Pel, MD   PT End of Session - 05/20/21 1103     Visit Number 4    Number of Visits 18    Date for PT Re-Evaluation 07/02/21    Authorization - Number of Visits 49    PT Start Time 1103    PT Stop Time 1152    PT Time Calculation (min) 49 min    Activity Tolerance Patient tolerated treatment well    Behavior During Therapy [facility] for tasks assessed/performed              Past Medical History:  Diagnosis Date   Arthritis    Cardiomyopathy    Non-ischemic   CHF (congestive heart failure) ([place])    Diabetes mellitus without complication (HCC)    HTN (hypertension)    Hyperlipidemia    Hypokalemia    Obesity    OSA (obstructive sleep apnea)    Sleep apnea    had surgery- no cpap   Stroke ([place]) 12/9831   Systolic CHF [facility])    Past Surgical History:  Procedure Laterality Date   avulsion fracture of left hip     CARDIAC CATHETERIZATION  10/08/08   LEFT HEART CATH AND CORONARY ANGIOGRAPHY N/A 08/03/2016   Procedure: Left Heart Cath and Coronary Angiography;  Surgeon: Jolaine Artist, MD;  Location: [place] [facility];  Service: Cardiovascular;  Laterality: N/A;   TONSILLECTOMY     TOTAL KNEE ARTHROPLASTY Right 04/21/2021   Procedure: RIGHT TOTAL KNEE ARTHROPLASTY;  Surgeon: Meredith Pel, MD;  Location: [place];  Service: Orthopedics;  Laterality: Right;   UVULOPALATOPHARYNGOPLASTY     Patient Active Problem List   Diagnosis Date Noted   Arthritis of right knee    S/P total knee arthroplasty, right 04/21/2021   Chest discomfort 07/28/2016   Controlled type 2 diabetes mellitus without complication ([place]) 82/50/5397   Bilateral shoulder pain 10/31/2013   Erectile dysfunction 04/18/2013   Mixed hyperlipidemia 11/26/2009   LIMB PAIN 07/23/2009   GERD 04/29/2009    Obesity 10/22/2008   OBSTRUCTIVE SLEEP APNEA 10/22/2008   Essential hypertension 10/22/2008   CARDIOMYOPATHY 10/22/2008   CONGESTIVE HEART FAILURE, MILD 67/34/1937   Chronic systolic heart failure ([place]) 10/16/2008    REFERRING DIAG: T02.409 (ICD-10-CM) - Post-operative state Z96.659 (ICD-10-CM) - S/P TKR (total knee replacement)   ONSET DATE: 04/21/2021  THERAPY DIAG:  Difficulty in walking, not elsewhere classified  Stiffness of right knee, not elsewhere classified  Right knee pain, unspecified chronicity  Localized edema  Muscle weakness (generalized)  PERTINENT HISTORY: Previous L hip fracture, cardiac history, DM and previous stroke.  PRECAUTIONS: Knee  SUBJECTIVE: [place] reports excellent HEP compliance.   PAIN:  Are you having pain? Yes NPRS scale: 4.5/10 today, lowest 2-3/10 and highest 8/10 this week Pain location: knee Pain orientation: Right, Medial, Lateral, and Anterior  PAIN TYPE: surgical Pain description: constant, sharp, aching, and sore   Aggravating factors: prolonged postures and too much WB Relieving factors: exercises and ice  PATIENT GOALS Return to work, be able to walk normally and do stairs without pain.     OBJECTIVE:    DIAGNOSTIC FINDINGS: Logan has R knee edema, limited R knee AROM, poor quadriceps strength and gait impairments (B axillary crutches) in need of skilled care s/p R TKA.   PATIENT SURVEYS:  05/07/2021  FOTO 49 (Goal 68 in 14 visits)   COGNITION:          Overall cognitive status: Within functional limits for tasks assessed                        LE AROM/PROM:   A/PROM Right 05/07/2021 Left 05/07/2021 RLE 05/13/21  Hip flexion       Hip extension       Hip abduction       Hip adduction       Hip internal rotation       Hip external rotation       Knee flexion 74 122 PROM 96* Seated after man  Knee extension -8 0 PROM -4* Supine after man  Ankle dorsiflexion       Ankle plantarflexion       Ankle inversion        Ankle eversion        (Blank rows = not tested)   LE MMT:   MMT Deferred due to edema and time constraints Right 05/07/2021 Left 05/07/2021  Hip flexion      Hip extension      Hip abduction      Hip adduction      Hip internal rotation      Hip external rotation      Knee flexion      Knee extension      Ankle dorsiflexion      Ankle plantarflexion      Ankle inversion      Ankle eversion       (Blank rows = not tested)     TODAY'S TREATMENT: 05/20/21 Therapeutic Exercise: Aerobic:  Recumbent bike partial to full ROM Seat 10 x 5 minutes  Supine:  Quadriceps sets 2 sets of 10 for 5 seconds (R heel on towel roll)   R SAQ 5 sec hold x 10 R SLR 2x10 R Heel slides x 3 (demo for HEP) Prone: Seated: knee extension machine 90-40 degrees 15# X slow eccentrics  Standing: Neuromuscular Re-education: Manual Therapy:  Therapeutic Activity: FUNCTIONAL ACTIVITY leg press single-leg R only 15X 50# push into extension (3 seconds) and stretch into flexion (3 seconds) for sit to stand, stairs, curbs Self Care: Trigger Point Dry Needling:  Modalities:  Vaso R knee high 34* 10 minutes  05/15/2021 Therapeutic Exercise: Aerobic:  Recumbent bike Seat 10 4 minutes AAROM to full range and Seat 9 AAROM to full range 4 minutes Supine: Quadriceps sets 2 sets of 10 for 5 seconds (R heel on towel roll) Prone: Seated: Tailgate knee flexion 3 minutes; AAROM (L pushes R into flexion) 10X 10 seconds; knee extension machine 90-40 degrees 15# 10X slow eccentrics  Standing: Neuromuscular Re-education: Manual Therapy:  Therapeutic Activity: FUNCTIONAL ACTIVITY leg press single-leg R only 15X 50# push into extension (3 seconds) and stretch into flexion (3 seconds) for sit to stand, stairs, curbs Self Care: Trigger Point Dry Needling:  Modalities:  Vaso R knee medium 34* 10 minutes  05/13/2021  Therapeutic Exercise: Aerobic:  Nustep seat 10 level 5 with BLEs & BUEs for 8 min Supine:  RLE SLR 0# 10reps 2  sets quad set prior to ea rep  Heel slides with foot on 55cm ball with strap 10reps 2 sets 5"hold Prone: Seated:   Heel Slides max flex & ext with foot on pillow 15 reps Lt LAQs 0# 15 reps 2 sets Active Hamstring stretch during LAQ rest LE ext with  strap DF 30" hold 2 reps  Standing: Neuromuscular Re-education: Manual Therapy:  Grade 3-4 Rt knee flex with traction, tibia IR & LLE LAQ  and ext supine with femoral IR Scar mobilizations to decrease adherance & increase tensile strength Therapeutic Activity: Self Care: Trigger Point Dry Needling:  Modalities:  Vaso Lt knee medium 34* 10 minutes  05/07/2021 Treatment Access Code: [email or web address] URL: [email or web address] Date: 05/07/2021 Prepared by: Vista Mink   Exercises Supine Quadricep Sets - 3-5 x daily - 7 x weekly - 2-3 sets - 10 reps - 5 second hold Seated Knee Flexion AAROM - 3 x daily - 7 x weekly - 1 sets - 1 reps - 3 minutes hold       PATIENT EDUCATION:  Education details: HEP progressed Person educated: Patient Education method: Explanation, Demonstration, Tactile cues, Verbal cues, and Handouts Education comprehension: verbalized understanding, returned demonstration, verbal cues required, tactile cues required, and needs further education     HOME EXERCISE PROGRAM: Access Code: [email or web address] URL: [email or web address] Date: 05/20/2021 Prepared by: Almyra Free  Exercises Supine Quadricep Sets - 3-5 x daily - 7 x weekly - 2-3 sets - 10 reps - 5 second hold Seated Knee Flexion AAROM - 3 x daily - 7 x weekly - 1 sets - 1 reps - 3 minutes hold Supine Knee Extension Strengthening - 1 x daily - 7 x weekly - 2 sets - 10 reps Supine Active Straight Leg Raise - 2 x daily - 7 x weekly - 2 sets - 10 reps Supine Heel Slide - 1 x daily - 7 x weekly - 2 sets - 10 reps      ASSESSMENT:   CLINICAL IMPRESSION: Jed tolerated machines much better today doing them prior to mat exercises. He saw MD prior to  his appt today and they placed an ACE bandage around his knee which limited his flexion capacity. Steri strips were removed and he had some bleeding so we did not remove it and did not take measurements. Requested more pressure on vaso today so changed to high pressure.  REHAB POTENTIAL: Good   CLINICAL DECISION MAKING: Stable/uncomplicated   EVALUATION COMPLEXITY: Low     GOALS: Goals reviewed with patient? Yes   SHORT TERM GOALS:   STG Name Target Date Goal status  1 Rashon will have R knee AROM at -4 to 90 degrees. Baseline: -2 to 92 degrees (was -8 to 74 degrees) 06/04/2021 Met    LONG TERM GOALS:    LTG Name Target Date Goal status  1 Improve FOTO to 68 in 14 visits. Baseline: 49 07/02/2021 INITIAL  2 Improve R knee pain to 0-4/10 (0-3/10 long-term) on the VAS. Baseline: 7/10 07/02/2021 INITIAL  3 Improve R knee AROM to 0-110 degrees. Baseline: -8 to 74 degrees 07/02/2021 INITIAL  4 Improve R quadriceps strength as assessed by objective testing and functional scores. Baseline: Using crutches, FOTO 49, objective test deferred due to time and edema 07/02/2021 INITIAL  5 Jen will be independent with his long-term HEP at DC. Baseline: 07/02/2021 INITIAL    PLAN: PT FREQUENCY:  1-3X/week starting at 2-3X/week   PT DURATION: 8 weeks   PLANNED INTERVENTIONS: Therapeutic exercises, Therapeutic activity, Neuro Muscular re-education, Balance training, Gait training, Patient/Family education, Stair training, Cryotherapy, Vasopneumatic device, and Manual therapy   PLAN FOR NEXT SESSION  Quadriceps strength, balance, progress to functional activities as needed    Zaylah Blecha, PT, MPT 05/20/2021, 2:11 PM

## 2021-05-21 ENCOUNTER — Encounter: Payer: Self-pay | Admitting: Orthopedic Surgery

## 2021-05-21 NOTE — Progress Notes (Signed)
Post-Op Visit Note   Patient: Caleb Garcia           Date of Birth: 12/12/1965           MRN: [id number] Visit Date: 05/20/2021 PCP: Shirline Frees, NP   Assessment & Plan:  Chief Complaint:  Chief Complaint  Patient presents with   Right Knee - Routine Post Op   Visit Diagnoses: No diagnosis found.  Plan: Caleb Garcia is a 56 year old patient with right total knee replacement performed 04/21/2021.  Still is having pain.  Taking muscle relaxers and Neurontin.  No fevers or chills.  On exam he has range of motion of about 8 to 90 degrees.  Incision intact but he has 2 punctate areas of granulation tissue which are removed when I took off the last Steri-Strips.  Plan is to discontinue aspirin.  He is ambulatory with negative Homans and no calf tenderness.  Flexeril prescribed along with Norco and refilled Neurontin.  3-week return with Franky Macho on a Friday afternoon for clinical recheck.  Follow-Up Instructions: No follow-ups on file.   Orders:  No orders of the defined types were placed in this encounter.  Meds ordered this encounter  Medications   gabapentin (NEURONTIN) 300 MG capsule    Sig: Take 1 capsule (300 mg total) by mouth 3 (three) times daily.    Dispense:  30 capsule    Refill:  0   cyclobenzaprine (FLEXERIL) 5 MG tablet    Sig: Take 2 tablets (10 mg total) by mouth 3 (three) times daily as needed for muscle spasms.    Dispense:  30 tablet    Refill:  0   HYDROcodone-acetaminophen (NORCO/VICODIN) 5-325 MG tablet    Sig: Take 1 tablet by mouth every 8 (eight) hours as needed for moderate pain.    Dispense:  30 tablet    Refill:  0    Imaging: No results found.  PMFS History: Patient Active Problem List   Diagnosis Date Noted   Arthritis of right knee    S/P total knee arthroplasty, right 04/21/2021   Chest discomfort 07/28/2016   Controlled type 2 diabetes mellitus without complication (HCC) 11/26/2013   Bilateral shoulder pain 10/31/2013   Erectile  dysfunction 04/18/2013   Mixed hyperlipidemia 11/26/2009   LIMB PAIN 07/23/2009   GERD 04/29/2009   Obesity 10/22/2008   OBSTRUCTIVE SLEEP APNEA 10/22/2008   Essential hypertension 10/22/2008   CARDIOMYOPATHY 10/22/2008   CONGESTIVE HEART FAILURE, MILD 10/22/2008   Chronic systolic heart failure (HCC) 10/16/2008   Past Medical History:  Diagnosis Date   Arthritis    Cardiomyopathy    Non-ischemic   CHF (congestive heart failure) (HCC)    Diabetes mellitus without complication (HCC)    HTN (hypertension)    Hyperlipidemia    Hypokalemia    Obesity    OSA (obstructive sleep apnea)    Sleep apnea    had surgery- no cpap   Stroke (HCC) 10/2008   Systolic CHF (HCC)     Family History  Problem Relation Age of Onset   Diabetes Mother    Hypertension Mother    Heart disease Mother    Cancer Father        Throat cancer   Stroke Father    Stroke Brother 47   Prostate cancer Neg Hx    Colon cancer Neg Hx    Colon polyps Neg Hx    Esophageal cancer Neg Hx    Rectal cancer Neg Hx    Stomach  cancer Neg Hx     Past Surgical History:  Procedure Laterality Date   avulsion fracture of left hip     CARDIAC CATHETERIZATION  10/08/08   LEFT HEART CATH AND CORONARY ANGIOGRAPHY N/A 08/03/2016   Procedure: Left Heart Cath and Coronary Angiography;  Surgeon: Dolores Patty, MD;  Location: [facility] INVASIVE CV LAB;  Service: Cardiovascular;  Laterality: N/A;   TONSILLECTOMY     TOTAL KNEE ARTHROPLASTY Right 04/21/2021   Procedure: RIGHT TOTAL KNEE ARTHROPLASTY;  Surgeon: Cammy Copa, MD;  Location: [facility] OR;  Service: Orthopedics;  Laterality: Right;   UVULOPALATOPHARYNGOPLASTY     Social History   Occupational History    Employer: UNEMPLOYED  Tobacco Use   Smoking status: Some Days    Types: Cigars   Smokeless tobacco: Never  Vaping Use   Vaping Use: Never used  Substance and Sexual Activity   Alcohol use: No   Drug use: No   Sexual activity: Not on file

## 2021-05-27 ENCOUNTER — Encounter: Payer: Self-pay | Admitting: Rehabilitative and Restorative Service Providers"

## 2021-05-27 ENCOUNTER — Ambulatory Visit (INDEPENDENT_AMBULATORY_CARE_PROVIDER_SITE_OTHER): Payer: BC Managed Care – PPO | Admitting: Rehabilitative and Restorative Service Providers"

## 2021-05-27 ENCOUNTER — Other Ambulatory Visit: Payer: Self-pay

## 2021-05-27 DIAGNOSIS — R600 Localized edema: Secondary | ICD-10-CM | POA: Diagnosis not present

## 2021-05-27 DIAGNOSIS — R262 Difficulty in walking, not elsewhere classified: Secondary | ICD-10-CM

## 2021-05-27 DIAGNOSIS — M25561 Pain in right knee: Secondary | ICD-10-CM

## 2021-05-27 DIAGNOSIS — M25661 Stiffness of right knee, not elsewhere classified: Secondary | ICD-10-CM

## 2021-05-27 DIAGNOSIS — M6281 Muscle weakness (generalized): Secondary | ICD-10-CM

## 2021-05-27 NOTE — Therapy (Signed)
OUTPATIENT PHYSICAL THERAPY TREATMENT NOTE   Patient Name: Caleb Garcia MRN: [id number] DOB:02/20/1966, 56 y.o., male Today's Date: 05/27/2021  PCP: Shirline Frees, NP REFERRING PROVIDER: Shirline Frees, NP   PT End of Session - 05/27/21 1443     Visit Number 5    Number of Visits 18    Date for PT Re-Evaluation 07/02/21    Authorization - Number of Visits 30    Progress Note Due on Visit 10    PT Start Time 1430    PT Stop Time 1518    PT Time Calculation (min) 48 min    Activity Tolerance Patient tolerated treatment well    Behavior During Therapy [facility] for tasks assessed/performed               Past Medical History:  Diagnosis Date   Arthritis    Cardiomyopathy    Non-ischemic   CHF (congestive heart failure) (HCC)    Diabetes mellitus without complication (HCC)    HTN (hypertension)    Hyperlipidemia    Hypokalemia    Obesity    OSA (obstructive sleep apnea)    Sleep apnea    had surgery- no cpap   Stroke (HCC) 10/2008   Systolic CHF [facility])    Past Surgical History:  Procedure Laterality Date   avulsion fracture of left hip     CARDIAC CATHETERIZATION  10/08/08   LEFT HEART CATH AND CORONARY ANGIOGRAPHY N/A 08/03/2016   Procedure: Left Heart Cath and Coronary Angiography;  Surgeon: Dolores Patty, MD;  Location: [facility] INVASIVE CV LAB;  Service: Cardiovascular;  Laterality: N/A;   TONSILLECTOMY     TOTAL KNEE ARTHROPLASTY Right 04/21/2021   Procedure: RIGHT TOTAL KNEE ARTHROPLASTY;  Surgeon: Cammy Copa, MD;  Location: [facility] OR;  Service: Orthopedics;  Laterality: Right;   UVULOPALATOPHARYNGOPLASTY     Patient Active Problem List   Diagnosis Date Noted   Arthritis of right knee    S/P total knee arthroplasty, right 04/21/2021   Chest discomfort 07/28/2016   Controlled type 2 diabetes mellitus without complication (HCC) 11/26/2013   Bilateral shoulder pain 10/31/2013   Erectile dysfunction 04/18/2013   Mixed hyperlipidemia 11/26/2009   LIMB PAIN  07/23/2009   GERD 04/29/2009   Obesity 10/22/2008   OBSTRUCTIVE SLEEP APNEA 10/22/2008   Essential hypertension 10/22/2008   CARDIOMYOPATHY 10/22/2008   CONGESTIVE HEART FAILURE, MILD 10/22/2008   Chronic systolic heart failure (HCC) 10/16/2008    REFERRING DIAG: T98.609 (ICD-10-CM) - Post-operative state Z96.659 (ICD-10-CM) - S/P TKR (total knee replacement)   ONSET DATE: 04/21/2021  THERAPY DIAG:  Difficulty in walking, not elsewhere classified  Stiffness of right knee, not elsewhere classified  Right knee pain, unspecified chronicity  Localized edema  Muscle weakness (generalized)  PERTINENT HISTORY: Previous L hip fracture, cardiac history, DM and previous stroke.  PRECAUTIONS: Knee  SUBJECTIVE: Nickalos reports excellent HEP compliance.  He is managing pain with flexeril, gabapentin and tylenol.  He still is not sleeping well and notes his sleeping is seriously interrupted by R knee pain.  PAIN:  Are you having pain? Yes NPRS scale: 5/10 today, lowest 2-3/10 and highest 8/10 this week Pain location: knee Pain orientation: Right, Medial, Lateral, and Anterior  PAIN TYPE: surgical Pain description: constant, sharp, aching, and sore   Aggravating factors: prolonged postures and too much WB Relieving factors: exercises and ice  PATIENT GOALS Return to work, be able to walk normally and do stairs without pain.     OBJECTIVE:  DIAGNOSTIC FINDINGS: Caedon has R knee edema, limited R knee AROM, poor quadriceps strength and gait impairments (B axillary crutches) in need of skilled care s/p R TKA.   PATIENT SURVEYS:  05/07/2021 FOTO 49 (Goal 68 in 14 visits)   COGNITION:          Overall cognitive status: Within functional limits for tasks assessed                        LE AROM/PROM:   A/PROM Right 05/07/2021 Left 05/07/2021 RLE 05/13/21 R LE 05/27/2021  Hip flexion        Hip extension        Hip abduction        Hip adduction        Hip internal rotation         Hip external rotation        Knee flexion 74 122 PROM 96* Seated after man 102 AROM  Knee extension -8 0 PROM -4* Supine after man -3 degrees AROM  Ankle dorsiflexion        Ankle plantarflexion        Ankle inversion        Ankle eversion         (Blank rows = not tested)   LE MMT:   MMT Deferred due to edema and time constraints Right 05/07/2021 Left 05/07/2021  Hip flexion      Hip extension      Hip abduction      Hip adduction      Hip internal rotation      Hip external rotation      Knee flexion      Knee extension      Ankle dorsiflexion      Ankle plantarflexion      Ankle inversion      Ankle eversion       (Blank rows = not tested)     TODAY'S TREATMENT: 05/27/21 Therapeutic Exercise: Aerobic:  Recumbent bike Seat 10 4 minutes AAROM to full range and Seat 9 AAROM to full range 4 minutes  Supine: Quadriceps sets 3 sets of 10 for 5 seconds (R heel on towel roll)  Prone:  Seated: Tailgate knee flexion 1 minute; AAROM (L pushes R into flexion) 10X 10 seconds; knee extension machine 90-40 degrees 20# 10X and 25# 5X slow eccentrics   Standing:  Neuromuscular Re-education:  Manual Therapy:   Therapeutic Activity: FUNCTIONAL ACTIVITY leg press single-leg R only 15X 50# push into extension (5 seconds) and stretch into flexion (5 seconds) for sit to stand, stairs, curbs  Modalities:  Vaso R knee medium 34* 10 minutes   05/20/21 Therapeutic Exercise: Aerobic:  Recumbent bike partial to full ROM Seat 10 x 5 minutes  Supine:  Quadriceps sets 2 sets of 10 for 5 seconds (R heel on towel roll)   R SAQ 5 sec hold x 10 R SLR 2x10 R Heel slides x 3 (demo for HEP) Prone: Seated: knee extension machine 90-40 degrees 15# X slow eccentrics  Standing: Neuromuscular Re-education: Manual Therapy:  Therapeutic Activity: FUNCTIONAL ACTIVITY leg press single-leg R only 15X 50# push into extension (3 seconds) and stretch into flexion (3 seconds) for sit to stand, stairs,  curbs Self Care: Trigger Point Dry Needling:  Modalities:  Vaso R knee high 34* 10 minutes   05/15/2021 Therapeutic Exercise: Aerobic:  Recumbent bike Seat 10 4 minutes AAROM to full range and Seat 9 AAROM to  full range 4 minutes Supine: Quadriceps sets 2 sets of 10 for 5 seconds (R heel on towel roll) Prone: Seated: Tailgate knee flexion 3 minutes; AAROM (L pushes R into flexion) 10X 10 seconds; knee extension machine 90-40 degrees 15# 10X slow eccentrics  Standing: Neuromuscular Re-education: Manual Therapy:  Therapeutic Activity: FUNCTIONAL ACTIVITY leg press single-leg R only 15X 50# push into extension (3 seconds) and stretch into flexion (3 seconds) for sit to stand, stairs, curbs Self Care: Trigger Point Dry Needling:  Modalities:  Vaso R knee medium 34* 10 minutes  PATIENT EDUCATION:  Education details: HEP review with edema control and quadriceps strength the primary emphasis Person educated: Patient Education method: Explanation, Demonstration, Tactile cues, Verbal cues, and Handouts Education comprehension: verbalized understanding, returned demonstration, verbal cues required, tactile cues required, and needs further education     HOME EXERCISE PROGRAM: Access Code: [email or web address] URL: [email or web address] Date: 05/20/2021 Prepared by: Almyra Free  Exercises Supine Quadricep Sets - 3-5 x daily - 7 x weekly - 2-3 sets - 10 reps - 5 second hold Seated Knee Flexion AAROM - 3 x daily - 7 x weekly - 1 sets - 1 reps - 3 minutes hold Supine Knee Extension Strengthening - 1 x daily - 7 x weekly - 2 sets - 10 reps Seated Active Straight Leg Raise - 2 x daily - 7 x weekly - 2 sets - 10 reps Supine Heel Slide - 1 x daily - 7 x weekly - 2 sets - 10 reps      ASSESSMENT:   CLINICAL IMPRESSION: [place] is giving great effort with his clinic and home programs.  Edema is limiting full extension (lacking 3 degrees) and flexion AROM (102, was 74 at evaluation).  HEP is focused  on quadriceps strength and edema control which will also hep with AROM.  Continue POC to meet LTGs.  REHAB POTENTIAL: Good   CLINICAL DECISION MAKING: Stable/uncomplicated   EVALUATION COMPLEXITY: Low     GOALS: Goals reviewed with patient? Yes   SHORT TERM GOALS:   STG Name Target Date Goal status  1 Dennies will have R knee AROM at -4 to 90 degrees. Baseline: -2 to 92 degrees (was -8 to 74 degrees) 06/04/2021 Met    LONG TERM GOALS:    LTG Name Target Date Goal status  1 Improve FOTO to 68 in 14 visits. Baseline: 49 07/02/2021 INITIAL  2 Improve R knee pain to 0-4/10 (0-3/10 long-term) on the VAS. Baseline: 7/10 07/02/2021 INITIAL  3 Improve R knee AROM to 0-110 degrees. Baseline: -8 to 74 degrees 07/02/2021 INITIAL  4 Improve R quadriceps strength as assessed by objective testing and functional scores. Baseline: Using crutches, FOTO 49, objective test deferred due to time and edema 07/02/2021 INITIAL  5 Que will be independent with his long-term HEP at [place]. Baseline: 07/02/2021 INITIAL    PLAN: PT FREQUENCY:  1-3X/week starting at 2-3X/week   PT DURATION: 8 weeks   PLANNED INTERVENTIONS: Therapeutic exercises, Therapeutic activity, Neuro Muscular re-education, Balance training, Gait training, Patient/Family education, Stair training, Cryotherapy, Vasopneumatic device, and Manual therapy   PLAN FOR NEXT SESSION  Quadriceps strength, balance, progress to functional activities as needed as edema allows.    Farley Ly, PT, MPT 05/27/2021, 3:09 PM

## 2021-05-29 ENCOUNTER — Encounter: Payer: Self-pay | Admitting: Rehabilitative and Restorative Service Providers"

## 2021-05-29 ENCOUNTER — Other Ambulatory Visit: Payer: Self-pay

## 2021-05-29 ENCOUNTER — Ambulatory Visit (INDEPENDENT_AMBULATORY_CARE_PROVIDER_SITE_OTHER): Payer: BC Managed Care – PPO | Admitting: Rehabilitative and Restorative Service Providers"

## 2021-05-29 DIAGNOSIS — R262 Difficulty in walking, not elsewhere classified: Secondary | ICD-10-CM | POA: Diagnosis not present

## 2021-05-29 DIAGNOSIS — M25661 Stiffness of right knee, not elsewhere classified: Secondary | ICD-10-CM

## 2021-05-29 DIAGNOSIS — M25561 Pain in right knee: Secondary | ICD-10-CM

## 2021-05-29 DIAGNOSIS — R600 Localized edema: Secondary | ICD-10-CM

## 2021-05-29 DIAGNOSIS — M6281 Muscle weakness (generalized): Secondary | ICD-10-CM

## 2021-05-29 NOTE — Therapy (Signed)
OUTPATIENT PHYSICAL THERAPY TREATMENT NOTE   Patient Name: Caleb Garcia MRN: [id number] DOB:[birth year], 56 y.o., male Today's Date: 05/29/2021  PCP: Dorothyann Peng, NP REFERRING PROVIDER: Meredith Pel, MD   PT End of Session - 05/29/21 1305     Visit Number 6    Number of Visits 18    Date for PT Re-Evaluation 07/02/21    Authorization - Number of Visits 30    Progress Note Due on Visit 10    PT Start Time 1258    PT Stop Time 9892    PT Time Calculation (min) 51 min    Activity Tolerance Patient tolerated treatment well    Behavior During Therapy [facility] for tasks assessed/performed                Past Medical History:  Diagnosis Date   Arthritis    Cardiomyopathy    Non-ischemic   CHF (congestive heart failure) ([place])    Diabetes mellitus without complication (HCC)    HTN (hypertension)    Hyperlipidemia    Hypokalemia    Obesity    OSA (obstructive sleep apnea)    Sleep apnea    had surgery- no cpap   Stroke ([place]) 02/9416   Systolic CHF [facility])    Past Surgical History:  Procedure Laterality Date   avulsion fracture of left hip     CARDIAC CATHETERIZATION  10/08/08   LEFT HEART CATH AND CORONARY ANGIOGRAPHY N/A 08/03/2016   Procedure: Left Heart Cath and Coronary Angiography;  Surgeon: Jolaine Artist, MD;  Location: [place] [facility];  Service: Cardiovascular;  Laterality: N/A;   TONSILLECTOMY     TOTAL KNEE ARTHROPLASTY Right 04/21/2021   Procedure: RIGHT TOTAL KNEE ARTHROPLASTY;  Surgeon: Meredith Pel, MD;  Location: [place];  Service: Orthopedics;  Laterality: Right;   UVULOPALATOPHARYNGOPLASTY     Patient Active Problem List   Diagnosis Date Noted   Arthritis of right knee    S/P total knee arthroplasty, right 04/21/2021   Chest discomfort 07/28/2016   Controlled type 2 diabetes mellitus without complication ([place]) 40/81/4481   Bilateral shoulder pain 10/31/2013   Erectile dysfunction 04/18/2013   Mixed hyperlipidemia 11/26/2009    LIMB PAIN 07/23/2009   GERD 04/29/2009   Obesity 10/22/2008   OBSTRUCTIVE SLEEP APNEA 10/22/2008   Essential hypertension 10/22/2008   CARDIOMYOPATHY 10/22/2008   CONGESTIVE HEART FAILURE, MILD 85/63/1497   Chronic systolic heart failure ([place]) 10/16/2008    REFERRING DIAG: W26.378 (ICD-10-CM) - Post-operative state Z96.659 (ICD-10-CM) - S/P TKR (total knee replacement)   ONSET DATE: 04/21/2021  THERAPY DIAG:  Difficulty in walking, not elsewhere classified  Stiffness of right knee, not elsewhere classified  Right knee pain, unspecified chronicity  Localized edema  Muscle weakness (generalized)  PERTINENT HISTORY: Previous L hip fracture, cardiac history, DM and previous stroke.  PRECAUTIONS: Knee  SUBJECTIVE: Thayer reports continued excellent HEP compliance.  He is managing pain with flexeril, gabapentin and tylenol.  He still is not sleeping well and notes his sleeping is seriously interrupted by R knee pain.    PAIN:  Are you having pain? Yes NPRS scale: 6/10 today, lowest 2-3/10 and highest 8/10 this week Pain location: knee Pain orientation: Right, Medial, Lateral, and Anterior  PAIN TYPE: surgical Pain description: constant, sharp, aching, and sore   Aggravating factors: prolonged postures and too much WB, very painful at night Relieving factors: exercises and ice  PATIENT GOALS Return to work, be able to walk normally and do  stairs without pain.     OBJECTIVE:    DIAGNOSTIC FINDINGS: Abayomi has R knee edema, limited R knee AROM, poor quadriceps strength and gait impairments (B axillary crutches) in need of skilled care s/p R TKA.   PATIENT SURVEYS:  05/07/2021 FOTO 49 (Goal 68 in 14 visits)   COGNITION:          Overall cognitive status: Within functional limits for tasks assessed                        LE AROM/PROM:   A/PROM Right 05/07/2021 Left 05/07/2021 RLE 05/13/21 R LE 05/27/2021  Hip flexion        Hip extension        Hip abduction        Hip  adduction        Hip internal rotation        Hip external rotation        Knee flexion 74 122 PROM 96* Seated after man 102 AROM  Knee extension -8 0 PROM -4* Supine after man -3 degrees AROM  Ankle dorsiflexion        Ankle plantarflexion        Ankle inversion        Ankle eversion         (Blank rows = not tested)   LE MMT:   MMT Deferred due to edema and time constraints Right 05/07/2021 Left 05/07/2021  Hip flexion      Hip extension      Hip abduction      Hip adduction      Hip internal rotation      Hip external rotation      Knee flexion      Knee extension      Ankle dorsiflexion      Ankle plantarflexion      Ankle inversion      Ankle eversion       (Blank rows = not tested)     TODAY'S TREATMENT:  05/29/21 Therapeutic Exercise: Aerobic:  Recumbent bike Seat 10 4 minutes AAROM to full range and Seat 9 AAROM to full range 4 minutes  Supine: Quadriceps sets (R heel on towel roll at home)  Prone:  Seated: Tailgate knee flexion 1 minute; AAROM (L pushes R into flexion) 10X 10 seconds  Standing:  Neuromuscular Re-education: Tandem walk forward and backward; heel to toe balance: eyes open; head turning; eyes closed and compliant surface 2X each 20 seconds  Manual Therapy:   Therapeutic Activity: FUNCTIONAL ACTIVITY leg press single-leg R only 10X 75# push into extension (5 seconds) and slow eccentrics for sit to stand, stairs, curbs  Step-down and step-up and over 4 and 6 inch step slow eccentrics and emphasize R knee flexion  Modalities:  Vaso R knee medium 34* 10 minutes   05/27/21 Therapeutic Exercise: Aerobic:  Recumbent bike Seat 10 4 minutes AAROM to full range and Seat 9 AAROM to full range 4 minutes  Supine: Quadriceps sets 3 sets of 10 for 5 seconds (R heel on towel roll)  Prone:  Seated: Tailgate knee flexion 1 minute; AAROM (L pushes R into flexion) 10X 10 seconds; knee extension machine 90-40 degrees 20# 10X and 25# 5X slow eccentrics    Standing:  Neuromuscular Re-education:  Manual Therapy:   Therapeutic Activity: FUNCTIONAL ACTIVITY leg press single-leg R only 15X 50# push into extension (5 seconds) and stretch into flexion (5 seconds) for sit to  stand, stairs, curbs  Modalities:  Vaso R knee medium 34* 10 minutes   05/20/21 Therapeutic Exercise: Aerobic:  Recumbent bike partial to full ROM Seat 10 x 5 minutes  Supine:  Quadriceps sets 2 sets of 10 for 5 seconds (R heel on towel roll)   R SAQ 5 sec hold x 10 R SLR 2x10 R Heel slides x 3 (demo for HEP) Prone: Seated: knee extension machine 90-40 degrees 15# X slow eccentrics  Standing: Neuromuscular Re-education: Manual Therapy:  Therapeutic Activity: FUNCTIONAL ACTIVITY leg press single-leg R only 15X 50# push into extension (3 seconds) and stretch into flexion (3 seconds) for sit to stand, stairs, curbs Self Care: Trigger Point Dry Needling:  Modalities:  Vaso R knee high 34* 10 minutes    PATIENT EDUCATION:  Education details: HEP review with edema control and quadriceps strength the primary emphasis Person educated: Patient Education method: Explanation, Demonstration, Tactile cues, Verbal cues, and Handouts Education comprehension: verbalized understanding, returned demonstration, verbal cues required, tactile cues required, and needs further education     HOME EXERCISE PROGRAM: Access Code: [email or web address] URL: [email or web address] Date: 05/20/2021 Prepared by: Almyra Free  Exercises Supine Quadricep Sets - 3-5 x daily - 7 x weekly - 2-3 sets - 10 reps - 5 second hold Seated Knee Flexion AAROM - 3 x daily - 7 x weekly - 1 sets - 1 reps - 3 minutes hold Supine Knee Extension Strengthening - 1 x daily - 7 x weekly - 2 sets - 10 reps Seated Active Straight Leg Raise - 2 x daily - 7 x weekly - 2 sets - 10 reps Supine Heel Slide - 1 x daily - 7 x weekly - 2 sets - 10 reps    ASSESSMENT:   CLINICAL IMPRESSION: Graciano continues to give  great effort with his clinic and home programs.  Edema is limiting full extension (lacking 3 degrees) and flexion AROM (102, was 74 at evaluation).  HEP is focused on quadriceps strength and edema control which will also help with AROM.  We progressed more functional activities and balance to help quadriceps strength and reduce edema.  Continue current plan.  REHAB POTENTIAL: Good   CLINICAL DECISION MAKING: Stable/uncomplicated   EVALUATION COMPLEXITY: Low     GOALS: Goals reviewed with patient? Yes   SHORT TERM GOALS:   STG Name Target Date Goal status  1 Montrice will have R knee AROM at -4 to 90 degrees. Baseline: -2 to 92 degrees (was -8 to 74 degrees) 06/04/2021 Met    LONG TERM GOALS:    LTG Name Target Date Goal status  1 Improve FOTO to 68 in 14 visits. Baseline: 49 07/02/2021 INITIAL  2 Improve R knee pain to 0-4/10 (0-3/10 long-term) on the VAS. Baseline: 7/10 07/02/2021 INITIAL  3 Improve R knee AROM to 0-110 degrees. Baseline: -8 to 74 degrees 07/02/2021 INITIAL  4 Improve R quadriceps strength as assessed by objective testing and functional scores. Baseline: Using crutches, FOTO 49, objective test deferred due to time and edema 07/02/2021 INITIAL  5 Derran will be independent with his long-term HEP at [place]. Baseline: 07/02/2021 INITIAL    PLAN: PT FREQUENCY:  1-3X/week starting at 2-3X/week   PT DURATION: 8 weeks   PLANNED INTERVENTIONS: Therapeutic exercises, Therapeutic activity, Neuro Muscular re-education, Balance training, Gait training, Patient/Family education, Stair training, Cryotherapy, Vasopneumatic device, and Manual therapy   PLAN FOR NEXT SESSION  More functional quadriceps strength, balance, progress to functional activities as needed as edema allows.  Reassess  and FOTO next visit.    Farley Ly, PT, MPT 05/29/2021, 1:41 PM

## 2021-06-03 ENCOUNTER — Telehealth: Payer: Self-pay | Admitting: Orthopedic Surgery

## 2021-06-03 ENCOUNTER — Other Ambulatory Visit: Payer: Self-pay

## 2021-06-03 ENCOUNTER — Encounter: Payer: Self-pay | Admitting: Rehabilitative and Restorative Service Providers"

## 2021-06-03 ENCOUNTER — Ambulatory Visit (INDEPENDENT_AMBULATORY_CARE_PROVIDER_SITE_OTHER): Payer: BC Managed Care – PPO | Admitting: Rehabilitative and Restorative Service Providers"

## 2021-06-03 DIAGNOSIS — R262 Difficulty in walking, not elsewhere classified: Secondary | ICD-10-CM | POA: Diagnosis not present

## 2021-06-03 DIAGNOSIS — R600 Localized edema: Secondary | ICD-10-CM

## 2021-06-03 DIAGNOSIS — M25661 Stiffness of right knee, not elsewhere classified: Secondary | ICD-10-CM | POA: Diagnosis not present

## 2021-06-03 DIAGNOSIS — M25561 Pain in right knee: Secondary | ICD-10-CM | POA: Diagnosis not present

## 2021-06-03 DIAGNOSIS — M6281 Muscle weakness (generalized): Secondary | ICD-10-CM

## 2021-06-03 NOTE — Telephone Encounter (Signed)
Patient called needing Rx refilled Flexeril. The number to contact patient is [phone] ?

## 2021-06-03 NOTE — Therapy (Signed)
OUTPATIENT PHYSICAL THERAPY TREATMENT NOTE   Patient Name: Caleb Garcia MRN: [id number] DOB:01-Sep-1965, 56 y.o., male Today's Date: 06/03/2021  PCP: Dorothyann Peng, NP REFERRING PROVIDER: Meredith Pel, MD   PT End of Session - 06/03/21 1423     Visit Number 7    Number of Visits 18    Date for PT Re-Evaluation 07/02/21    Authorization - Number of Visits 30    Progress Note Due on Visit 10    PT Start Time 1419    PT Stop Time 1520    PT Time Calculation (min) 61 min    Activity Tolerance Patient tolerated treatment well    Behavior During Therapy [facility] for tasks assessed/performed                 Past Medical History:  Diagnosis Date   Arthritis    Cardiomyopathy    Non-ischemic   CHF (congestive heart failure) ([place])    Diabetes mellitus without complication ([place])    HTN (hypertension)    Hyperlipidemia    Hypokalemia    Obesity    OSA (obstructive sleep apnea)    Sleep apnea    had surgery- no cpap   Stroke ([place]) 51/8841   Systolic CHF [facility])    Past Surgical History:  Procedure Laterality Date   avulsion fracture of left hip     CARDIAC CATHETERIZATION  10/08/08   LEFT HEART CATH AND CORONARY ANGIOGRAPHY N/A 08/03/2016   Procedure: Left Heart Cath and Coronary Angiography;  Surgeon: Jolaine Artist, MD;  Location: [place] [facility];  Service: Cardiovascular;  Laterality: N/A;   TONSILLECTOMY     TOTAL KNEE ARTHROPLASTY Right 04/21/2021   Procedure: RIGHT TOTAL KNEE ARTHROPLASTY;  Surgeon: Meredith Pel, MD;  Location: [place];  Service: Orthopedics;  Laterality: Right;   UVULOPALATOPHARYNGOPLASTY     Patient Active Problem List   Diagnosis Date Noted   Arthritis of right knee    S/P total knee arthroplasty, right 04/21/2021   Chest discomfort 07/28/2016   Controlled type 2 diabetes mellitus without complication ([place]) 66/09/3014   Bilateral shoulder pain 10/31/2013   Erectile dysfunction 04/18/2013   Mixed hyperlipidemia 11/26/2009    LIMB PAIN 07/23/2009   GERD 04/29/2009   Obesity 10/22/2008   OBSTRUCTIVE SLEEP APNEA 10/22/2008   Essential hypertension 10/22/2008   CARDIOMYOPATHY 10/22/2008   CONGESTIVE HEART FAILURE, MILD 04/13/3233   Chronic systolic heart failure ([place]) 10/16/2008    REFERRING DIAG: T73.220 (ICD-10-CM) - Post-operative state Z96.659 (ICD-10-CM) - S/P TKR (total knee replacement)   ONSET DATE: 04/21/2021  THERAPY DIAG:  Difficulty in walking, not elsewhere classified  Stiffness of right knee, not elsewhere classified  Right knee pain, unspecified chronicity  Localized edema  Muscle weakness (generalized)  PERTINENT HISTORY: Previous L hip fracture, cardiac history, DM and previous stroke.  PRECAUTIONS: Knee  SUBJECTIVE: Caleb Garcia reports his knee was feeling great Friday and Saturday and his sleep was better after his PT visit on Friday.  Sunday night through today he has had more difficulty with posterior knee pain and he feels as if his R knee is "tightening up."  PAIN:  Are you having pain? Yes NPRS scale: 810 today, lowest 2/10 and highest 8/10 this week Pain location: knee Pain orientation: Right, Medial, Lateral, and Anterior (posterior 06/03/2021) PAIN TYPE: surgical Pain description: constant, sharp, aching, and sore   Aggravating factors: prolonged postures and too much WB, very painful at night Relieving factors: exercises and ice, he  is out of flexoril  PATIENT GOALS Return to work, be able to walk normally and do stairs without pain.     OBJECTIVE:    DIAGNOSTIC FINDINGS: Caleb Garcia has R knee edema, limited R knee AROM, poor quadriceps strength and gait impairments (B axillary crutches) in need of skilled care s/p R TKA.   PATIENT SURVEYS:  05/07/2021 FOTO 48 (Goal 68 in 14 visits)   COGNITION:          Overall cognitive status: Within functional limits for tasks assessed                        LE AROM/PROM:   A/PROM Right 05/07/2021 Left 05/07/2021 RLE 05/13/21 R LE  05/27/2021 R LE 06/03/2021  Hip flexion         Hip extension         Hip abduction         Hip adduction         Hip internal rotation         Hip external rotation         Knee flexion 74 122 PROM 96* Seated after man 102 AROM 94 AROM (edema limited)  Knee extension -8 0 PROM -4* Supine after man -3 degrees AROM -2 degrees AROM  Ankle dorsiflexion         Ankle plantarflexion         Ankle inversion         Ankle eversion          (Blank rows = not tested)   LE MMT:   MMT Deferred due to edema and time constraints Right 05/07/2021 Left 05/07/2021  Hip flexion      Hip extension      Hip abduction      Hip adduction      Hip internal rotation      Hip external rotation      Knee flexion      Knee extension      Ankle dorsiflexion      Ankle plantarflexion      Ankle inversion      Ankle eversion       (Blank rows = not tested)     TODAY'S TREATMENT:  06/03/2021 Therapeutic Exercise: Aerobic:  Recumbent bike Seat 10 8 minutes AAROM to full range  Supine: Quadriceps sets (R heel on towel roll) 2 sets of 10 for 5 seconds  Prone:  Seated: Tailgate knee flexion 1 minute; AAROM (L pushes R into flexion) 10X 10 seconds  Hamstrings Isometrics 10X 5 seconds (30-50 a day at home)  Standing:  Neuromuscular Re-education: Tandem walk forward and backward on foam; heel to toe balance: eyes open; head turning; eyes closed and compliant surface 1X each 20 seconds.  Also, single leg stand 4X 10 seconds each side.  Manual Therapy:   Therapeutic Activity: FUNCTIONAL ACTIVITY leg press single-leg R only 10X 75# push into extension (5 seconds) and slow eccentrics for sit to stand, stairs, curbs  Step-down and step-up and over 4 and 6 inch step slow eccentrics and emphasize R knee flexion  Modalities:  Vaso R knee high 34* 10 minutes   05/29/21 Therapeutic Exercise: Aerobic:  Recumbent bike Seat 10 4 minutes AAROM to full range and Seat 9 AAROM to full range 4 minutes  Supine:  Quadriceps sets (R heel on towel roll at home)  Prone:  Seated: Tailgate knee flexion 1 minute; AAROM (L pushes R into flexion)  10X 10 seconds  Standing:  Neuromuscular Re-education: Tandem walk forward and backward; heel to toe balance: eyes open; head turning; eyes closed and compliant surface 2X each 20 seconds  Manual Therapy:   Therapeutic Activity: FUNCTIONAL ACTIVITY leg press single-leg R only 10X 75# push into extension (5 seconds) and slow eccentrics for sit to stand, stairs, curbs  Step-down and step-up and over 4 and 6 inch step slow eccentrics and emphasize R knee flexion  Modalities:  Vaso R knee medium 34* 10 minutes   05/27/21 Therapeutic Exercise: Aerobic:  Recumbent bike Seat 10 4 minutes AAROM to full range and Seat 9 AAROM to full range 4 minutes  Supine: Quadriceps sets 3 sets of 10 for 5 seconds (R heel on towel roll)  Prone:  Seated: Tailgate knee flexion 1 minute; AAROM (L pushes R into flexion) 10X 10 seconds; knee extension machine 90-40 degrees 20# 10X and 25# 5X slow eccentrics   Standing:  Neuromuscular Re-education:  Manual Therapy:   Therapeutic Activity: FUNCTIONAL ACTIVITY leg press single-leg R only 15X 50# push into extension (5 seconds) and stretch into flexion (5 seconds) for sit to stand, stairs, curbs  Modalities:  Vaso R knee medium 34* 10 minutes    PATIENT EDUCATION:  Education details: HEP review with edema control, quadriceps and (now) hamstrings strength the primary emphasis Person educated: Patient Education method: Explanation, Demonstration, Tactile cues, Verbal cues, and Handouts Education comprehension: verbalized understanding, returned demonstration, verbal cues required, tactile cues required, and needs further education     HOME EXERCISE PROGRAM: Access Code: [email or web address] URL: [email or web address] Date: 05/20/2021 Prepared by: Almyra Free  Exercises Supine Quadricep Sets - 3-5 x daily - 7 x weekly - 2-3  sets - 10 reps - 5 second hold Seated Knee Flexion AAROM - 3 x daily - 7 x weekly - 1 sets - 1 reps - 3 minutes hold Supine Knee Extension Strengthening - 1 x daily - 7 x weekly - 2 sets - 10 reps Seated Active Straight Leg Raise - 2 x daily - 7 x weekly - 2 sets - 10 reps Supine Heel Slide - 1 x daily - 7 x weekly - 2 sets - 10 reps Seated Hamstrings Isometrics (Pull R leg back into L shin) 10X 5 seconds (30-50/Day)    ASSESSMENT:   CLINICAL IMPRESSION: Caleb Garcia was doing great after his visit on last Friday.  He was sleeping better and felt as if he would be able to return to work soon.  Sunday night through today, he has been experiencing increased posterior knee pain.  It appears to be a combination of edema ans possibly some R lateral hamstrings tendonitis.  We reviewed icing recommendations and added a hamstrings isometric exercise to address this.  I anticipate this pain will improve by next week and we can reassess AROM at or before that time (edema and pain limited flexion AROM today).  HEP is focused on quadriceps and hamstrings strength and edema control which will also help with AROM.  He again looked good with functional activities and balance.  Continue current plan.  REHAB POTENTIAL: Good   CLINICAL DECISION MAKING: Stable/uncomplicated   EVALUATION COMPLEXITY: Low     GOALS: Goals reviewed with patient? Yes   SHORT TERM GOALS:   STG Name Target Date Goal status  1 Caleb Garcia will have R knee AROM at -4 to 90 degrees. Baseline: -2 to 92 degrees (was -8 to 74 degrees) 06/04/2021 Met    LONG TERM GOALS:  LTG Name Target Date Goal status  1 Improve FOTO to 68 in 14 visits. Baseline: 49 07/02/2021 INITIAL  2 Improve R knee pain to 0-4/10 (0-3/10 long-term) on the VAS. Baseline: 7/10 07/02/2021 INITIAL  3 Improve R knee AROM to 0-110 degrees. Baseline: -8 to 74 degrees 07/02/2021 INITIAL  4 Improve R quadriceps strength as assessed by objective testing and functional  scores. Baseline: Using crutches, FOTO 49, objective test deferred due to time and edema 07/02/2021 INITIAL  5 Caleb Garcia will be independent with his long-term HEP at [place]. Baseline: 07/02/2021 INITIAL    PLAN: PT FREQUENCY:  1-3X/week starting at 2-3X/week   PT DURATION: 8 weeks   PLANNED INTERVENTIONS: Therapeutic exercises, Therapeutic activity, Neuro Muscular re-education, Balance training, Gait training, Patient/Family education, Stair training, Cryotherapy, Vasopneumatic device, and Manual therapy   PLAN FOR NEXT SESSION  Continue to address hamstrings isometrics and edema.  More functional quadriceps strength, balance, progress to functional activities as needed as edema allows.  Reassess and FOTO next visit or 2.    Farley Ly, PT, MPT 06/03/2021, 3:14 PM

## 2021-06-04 ENCOUNTER — Other Ambulatory Visit: Payer: Self-pay | Admitting: Surgical

## 2021-06-04 MED ORDER — CYCLOBENZAPRINE HCL 5 MG PO TABS: 5.0000 mg | ORAL_TABLET | Freq: Three times a day (TID) | ORAL | 0 refills | Status: DC | PRN

## 2021-06-04 NOTE — Telephone Encounter (Signed)
Sent this in and canceled Robaxin RX he has in chart. Let him know not to take Flexeril if he is taking Robaxin, just choose one or the other

## 2021-06-04 NOTE — Telephone Encounter (Signed)
Patient aware of the below message  

## 2021-06-05 ENCOUNTER — Ambulatory Visit (INDEPENDENT_AMBULATORY_CARE_PROVIDER_SITE_OTHER): Payer: BC Managed Care – PPO | Admitting: Rehabilitative and Restorative Service Providers"

## 2021-06-05 ENCOUNTER — Other Ambulatory Visit: Payer: Self-pay

## 2021-06-05 ENCOUNTER — Encounter: Payer: Self-pay | Admitting: Rehabilitative and Restorative Service Providers"

## 2021-06-05 DIAGNOSIS — M25561 Pain in right knee: Secondary | ICD-10-CM

## 2021-06-05 DIAGNOSIS — R600 Localized edema: Secondary | ICD-10-CM | POA: Diagnosis not present

## 2021-06-05 DIAGNOSIS — M25661 Stiffness of right knee, not elsewhere classified: Secondary | ICD-10-CM | POA: Diagnosis not present

## 2021-06-05 DIAGNOSIS — R262 Difficulty in walking, not elsewhere classified: Secondary | ICD-10-CM

## 2021-06-05 DIAGNOSIS — M6281 Muscle weakness (generalized): Secondary | ICD-10-CM

## 2021-06-05 NOTE — Therapy (Signed)
OUTPATIENT PHYSICAL THERAPY TREATMENT/PROGRESS NOTE   Patient Name: Caleb Garcia MRN: [id number] DOB:1965/12/14, 56 y.o., male 42 Date: 06/05/2021  PCP: Dorothyann Peng, NP REFERRING PROVIDER: Meredith Pel, MD  Progress Note Reporting Period 05/07/2021 to 06/05/2021  See note below for Objective Data and Assessment of Progress/Goals.      PT End of Session - 06/05/21 1432     Visit Number 8    Number of Visits 18    Date for PT Re-Evaluation 07/02/21    Authorization - Number of Visits 30    Progress Note Due on Visit 10    PT Start Time 3785    PT Stop Time 1523    PT Time Calculation (min) 55 min    Activity Tolerance Patient tolerated treatment well    Behavior During Therapy WFL for tasks assessed/performed                  Past Medical History:  Diagnosis Date   Arthritis    Cardiomyopathy    Non-ischemic   CHF (congestive heart failure) ([place])    Diabetes mellitus without complication (HCC)    HTN (hypertension)    Hyperlipidemia    Hypokalemia    Obesity    OSA (obstructive sleep apnea)    Sleep apnea    had surgery- no cpap   Stroke ([place]) 88/5027   Systolic CHF [facility])    Past Surgical History:  Procedure Laterality Date   avulsion fracture of left hip     CARDIAC CATHETERIZATION  10/08/08   LEFT HEART CATH AND CORONARY ANGIOGRAPHY N/A 08/03/2016   Procedure: Left Heart Cath and Coronary Angiography;  Surgeon: Jolaine Artist, MD;  Location: [facility];  Service: Cardiovascular;  Laterality: N/A;   TONSILLECTOMY     TOTAL KNEE ARTHROPLASTY Right 04/21/2021   Procedure: RIGHT TOTAL KNEE ARTHROPLASTY;  Surgeon: Meredith Pel, MD;  Location: [facility];  Service: Orthopedics;  Laterality: Right;   UVULOPALATOPHARYNGOPLASTY     Patient Active Problem List   Diagnosis Date Noted   Arthritis of right knee    S/P total knee arthroplasty, right 04/21/2021   Chest discomfort 07/28/2016   Controlled type 2 diabetes mellitus without  complication ([place]) 74/03/8785   Bilateral shoulder pain 10/31/2013   Erectile dysfunction 04/18/2013   Mixed hyperlipidemia 11/26/2009   LIMB PAIN 07/23/2009   GERD 04/29/2009   Obesity 10/22/2008   OBSTRUCTIVE SLEEP APNEA 10/22/2008   Essential hypertension 10/22/2008   CARDIOMYOPATHY 10/22/2008   CONGESTIVE HEART FAILURE, MILD 76/72/0947   Chronic systolic heart failure ([place]) 10/16/2008    REFERRING DIAG: S96.283 (ICD-10-CM) - Post-operative state Z96.659 (ICD-10-CM) - S/P TKR (total knee replacement)   ONSET DATE: 04/21/2021  THERAPY DIAG:  Difficulty in walking, not elsewhere classified  Stiffness of right knee, not elsewhere classified  Right knee pain, unspecified chronicity  Localized edema  Muscle weakness (generalized)  PERTINENT HISTORY: Previous L hip fracture, cardiac history, DM and previous stroke.  PRECAUTIONS: Knee  SUBJECTIVE: Caleb Garcia continues to have more difficulty with posterior knee pain and he feels as if his R knee is "tightening up."  No change since Wednesday.  Before this past Sunday (05/29/2021), he was ready to discuss return to work.  PAIN:  Are you having pain? Yes NPRS scale: 810 today, lowest 2/10 and highest 8/10 this week Pain location: knee Pain orientation: Right, Medial, Lateral, and Anterior (posterior 06/03/2021) PAIN TYPE: surgical Pain description: constant, sharp, aching, and sore   Aggravating  factors: prolonged postures and too much WB, very painful at night Relieving factors: exercises and ice, he is out of flexoril  PATIENT GOALS Return to work, be able to walk normally and do stairs without pain.     OBJECTIVE:    DIAGNOSTIC FINDINGS: Caleb Garcia has R knee edema, limited R knee AROM, poor quadriceps strength and gait impairments (B axillary crutches) in need of skilled care s/p R TKA.   PATIENT SURVEYS:  05/07/2021 FOTO 68 (Goal 68 in 14 visits)   COGNITION:          Overall cognitive status: Within functional limits for  tasks assessed                        LE AROM/PROM:   A/PROM Right 05/07/2021 Left 05/07/2021 RLE 05/13/21 R LE 05/27/2021 R LE 06/03/2021 R LE AROM 06/05/2021  Hip flexion          Hip extension          Hip abduction          Hip adduction          Hip internal rotation          Hip external rotation          Knee flexion 74 122 PROM 96* Seated after man 102 AROM 94 AROM (edema limited) 96 degrees AROM  Knee extension -8 0 PROM -4* Supine after man -3 degrees AROM -2 degrees AROM -3 degrees AROM  Ankle dorsiflexion          Ankle plantarflexion          Ankle inversion          Ankle eversion           (Blank rows = not tested)   LE MMT:   MMT Deferred due to edema and time constraints Right 05/07/2021 Left 05/07/2021  Hip flexion      Hip extension      Hip abduction      Hip adduction      Hip internal rotation      Hip external rotation      Knee flexion      Knee extension      Ankle dorsiflexion      Ankle plantarflexion      Ankle inversion      Ankle eversion       (Blank rows = not tested)     TODAY'S TREATMENT:  06/05/2021 Therapeutic Exercise: Aerobic:  Recumbent bike Seat 9 for 8 minutes AAROM to full range  Supine: Quadriceps sets (R heel on towel roll) 2 sets of 10 for 5 seconds  Seated straight leg raises 3 sets of 5 for 3 seconds  Seated: Tailgate knee flexion 3 minute;   Hamstrings Isometrics 10X 5 seconds (30-50 a day at home)   Modalities:  Vaso R knee high 34* 15 minutes   06/03/2021 Therapeutic Exercise: Aerobic:  Recumbent bike Seat 10 8 minutes AAROM to full range  Supine: Quadriceps sets (R heel on towel roll) 2 sets of 10 for 5 seconds  Prone:  Seated: Tailgate knee flexion 1 minute; AAROM (L pushes R into flexion) 10X 10 seconds  Hamstrings Isometrics 10X 5 seconds (30-50 a day at home)  Standing:  Neuromuscular Re-education: Tandem walk forward and backward on foam; heel to toe balance: eyes open; head turning; eyes closed and  compliant surface 1X each 20 seconds.  Also, single leg stand 4X 10  seconds each side.  Manual Therapy:   Therapeutic Activity: FUNCTIONAL ACTIVITY leg press single-leg R only 10X 75# push into extension (5 seconds) and slow eccentrics for sit to stand, stairs, curbs  Step-down and step-up and over 4 and 6 inch step slow eccentrics and emphasize R knee flexion  Modalities:  Vaso R knee high 34* 10 minutes   05/29/21 Therapeutic Exercise: Aerobic:  Recumbent bike Seat 10 4 minutes AAROM to full range and Seat 9 AAROM to full range 4 minutes  Supine: Quadriceps sets (R heel on towel roll at home)  Prone:  Seated: Tailgate knee flexion 1 minute; AAROM (L pushes R into flexion) 10X 10 seconds  Standing:  Neuromuscular Re-education: Tandem walk forward and backward; heel to toe balance: eyes open; head turning; eyes closed and compliant surface 2X each 20 seconds  Manual Therapy:   Therapeutic Activity: FUNCTIONAL ACTIVITY leg press single-leg R only 10X 75# push into extension (5 seconds) and slow eccentrics for sit to stand, stairs, curbs  Step-down and step-up and over 4 and 6 inch step slow eccentrics and emphasize R knee flexion  Modalities:  Vaso R knee medium 34* 10 minutes   PATIENT EDUCATION:  Education details: HEP review with edema control, quadriceps and (now) hamstrings strength the primary emphasis.  Reviewed reassessment. Person educated: Patient Education method: Explanation, Demonstration, Tactile cues, Verbal cues, and Handouts Education comprehension: verbalized understanding, returned demonstration, verbal cues required, tactile cues required, and needs further education     HOME EXERCISE PROGRAM: Access Code: [email or web address] URL: [email or web address] Date: 05/20/2021 Prepared by: Almyra Free  Exercises Supine Quadricep Sets - 3-5 x daily - 7 x weekly - 2-3 sets - 10 reps - 5 second hold Seated Knee Flexion AAROM - 3 x daily - 7 x weekly - 1 sets - 1  reps - 3 minutes hold Supine Knee Extension Strengthening - 1 x daily - 7 x weekly - 2 sets - 10 reps Seated Active Straight Leg Raise - 2 x daily - 7 x weekly - 2 sets - 10 reps Supine Heel Slide - 1 x daily - 7 x weekly - 2 sets - 10 reps Seated Hamstrings Isometrics (Pull R leg back into L shin) 10X 5 seconds (30-50/Day)    ASSESSMENT:   CLINICAL IMPRESSION: Caleb Garcia was doing great after his visit on Friday 05/29/2021.  He was sleeping better and felt as if he would be able to return to work soon.  Sunday night (05/31/2021) through today (06/05/2021), he has been experiencing increased posterior knee pain.  It appears to be a combination of edema and possibly some R lateral hamstrings tendonitis.  We reviewed icing recommendations and added a hamstrings isometric exercise to address this.  HEP is focused on quadriceps and hamstrings strength and edema control which will also help with AROM.  He looks good with functional activities and balance.  I recommend he return to PT next week to continue to manage his recent flare-up with transfer into independent PT still expected in 3-4 weeks as edema gets back under control.  REHAB POTENTIAL: Good   CLINICAL DECISION MAKING: Stable/uncomplicated   EVALUATION COMPLEXITY: Low     GOALS: Goals reviewed with patient? Yes   SHORT TERM GOALS:   STG Name Target Date Goal status  1 Caleb Garcia will have R knee AROM at -4 to 90 degrees. Baseline: -2 to 92 degrees (was -8 to 74 degrees) 06/04/2021 Met    LONG TERM GOALS:    LTG Name Target Date  Goal status  1 Improve FOTO to 68 in 14 visits. Baseline: 49 07/02/2021 On Going  2 Improve R knee pain to 0-4/10 (0-3/10 long-term) on the VAS. Baseline: 7/10 07/02/2021 On Going  3 Improve R knee AROM to 0-110 degrees. Baseline: -8 to 74 degrees 07/02/2021 -3 to 96 degrees (edema limited)  4 Improve R quadriceps strength as assessed by objective testing and functional scores. Baseline: Using crutches, FOTO 49,  objective test deferred due to time and edema 07/02/2021 Deferred due to edema  5 Caleb Garcia will be independent with his long-term HEP at [place]. Baseline: 07/02/2021 On Going    PLAN: PT FREQUENCY:  2X/week   PT DURATION: 4 additional weeks   PLANNED INTERVENTIONS: Therapeutic exercises, Therapeutic activity, Neuro Muscular re-education, Balance training, Gait training, Patient/Family education, Stair training, Cryotherapy, Vasopneumatic device, and Manual therapy   PLAN FOR NEXT SESSION  Continue to address  edema, knee AROM and quadriceps strength.  More functional quadriceps strength, balance, progress to functional activities as needed as edema allows.    Farley Ly, PT, MPT 06/05/2021, 3:16 PM

## 2021-06-10 ENCOUNTER — Encounter: Payer: Self-pay | Admitting: Orthopedic Surgery

## 2021-06-10 ENCOUNTER — Other Ambulatory Visit: Payer: Self-pay

## 2021-06-10 ENCOUNTER — Ambulatory Visit (INDEPENDENT_AMBULATORY_CARE_PROVIDER_SITE_OTHER): Payer: BC Managed Care – PPO | Admitting: Surgical

## 2021-06-10 DIAGNOSIS — Z96651 Presence of right artificial knee joint: Secondary | ICD-10-CM

## 2021-06-10 MED ORDER — MELOXICAM 15 MG PO TABS: 15.0000 mg | ORAL_TABLET | Freq: Every day | ORAL | 1 refills | Status: DC

## 2021-06-10 NOTE — Progress Notes (Signed)
? ?Post-Op Visit Note ?  ?Patient: Caleb Garcia           ?Date of Birth: March 16, 1966           ?MRN: [id number] ?Visit Date: 06/10/2021 ?PCP: Shirline Frees, NP ? ? ?Assessment & Plan: ? ?Chief Complaint:  ?Chief Complaint  ?Patient presents with  ? Right Knee - Routine Post Op  ? ?Visit Diagnoses:  ?1. S/P total knee replacement, right   ? ? ?Plan: Patient is a 56 year old male who presents s/p right total knee arthroplasty on 04/21/2021.  He reports that he has been doing well following surgery and completed physical therapy last Friday.  However, he had awakened on Sunday morning with moderate pain.  No history of injury or overdoing it in therapy.  He localizes pain to the posterior aspect of the operative knee with no radiation.  Denies any groin pain or radicular pain.  Denies any fevers, chills, night sweats, drainage from his incision, change in appearance of the incision, chest pain, shortness of breath, calf pain.  He is taking just Tylenol and gabapentin for pain control.  He had to quit taking his Celebrex due to it making him sick.  He has no history of gout. ? ?On exam, right knee has no effusion.  Incision is well-healed without evidence of infection or dehiscence.  No calf tenderness.  Negative Homans' sign.  There is no surrounding redness.  Excellent quad strength with ability to perform straight leg raise without extensor lag.  Increased pain reproduced with hamstring strength testing.  No pain with hip range of motion. ? ?With no compelling evidence of any prosthetic joint infection, plan to trial anti-inflammatory medication with follow-up in 3 weeks for clinical recheck.  Did discuss the signs and symptoms of infection and he will call the office for sooner return if he notices these. ? ?Follow-Up Instructions: No follow-ups on file.  ? ?Orders:  ?No orders of the defined types were placed in this encounter. ? ?Meds ordered this encounter  ?Medications  ? meloxicam (MOBIC) 15 MG tablet  ?   Sig: Take 1 tablet (15 mg total) by mouth daily with breakfast.  ?  Dispense:  30 tablet  ?  Refill:  1  ? ? ?Imaging: ?No results found. ? ?PMFS History: ?Patient Active Problem List  ? Diagnosis Date Noted  ? Arthritis of right knee   ? S/P total knee arthroplasty, right 04/21/2021  ? Chest discomfort 07/28/2016  ? Controlled type 2 diabetes mellitus without complication (HCC) 11/26/2013  ? Bilateral shoulder pain 10/31/2013  ? Erectile dysfunction 04/18/2013  ? Mixed hyperlipidemia 11/26/2009  ? LIMB PAIN 07/23/2009  ? GERD 04/29/2009  ? Obesity 10/22/2008  ? OBSTRUCTIVE SLEEP APNEA 10/22/2008  ? Essential hypertension 10/22/2008  ? CARDIOMYOPATHY 10/22/2008  ? CONGESTIVE HEART FAILURE, MILD 10/22/2008  ? Chronic systolic heart failure (HCC) 10/16/2008  ? ?Past Medical History:  ?Diagnosis Date  ? Arthritis   ? Cardiomyopathy   ? Non-ischemic  ? CHF (congestive heart failure) (HCC)   ? Diabetes mellitus without complication (HCC)   ? HTN (hypertension)   ? Hyperlipidemia   ? Hypokalemia   ? Obesity   ? OSA (obstructive sleep apnea)   ? Sleep apnea   ? had surgery- no cpap  ? Stroke [facility]) 10/2008  ? Systolic CHF (HCC)   ?  ?Family History  ?Problem Relation Age of Onset  ? Diabetes Mother   ? Hypertension Mother   ? Heart disease  Mother   ? Cancer Father   ?     Throat cancer  ? Stroke Father   ? Stroke Brother 79  ? Prostate cancer Neg Hx   ? Colon cancer Neg Hx   ? Colon polyps Neg Hx   ? Esophageal cancer Neg Hx   ? Rectal cancer Neg Hx   ? Stomach cancer Neg Hx   ?  ?Past Surgical History:  ?Procedure Laterality Date  ? avulsion fracture of left hip    ? CARDIAC CATHETERIZATION  10/08/08  ? LEFT HEART CATH AND CORONARY ANGIOGRAPHY N/A 08/03/2016  ? Procedure: Left Heart Cath and Coronary Angiography;  Surgeon: Dolores Patty, MD;  Location: [facility] INVASIVE [facility];  Service: Cardiovascular;  Laterality: N/A;  ? TONSILLECTOMY    ? TOTAL KNEE ARTHROPLASTY Right 04/21/2021  ? Procedure: RIGHT TOTAL KNEE ARTHROPLASTY;   Surgeon: Cammy Copa, MD;  Location: [facility] OR;  Service: Orthopedics;  Laterality: Right;  ? UVULOPALATOPHARYNGOPLASTY    ? ?Social History  ? ?Occupational History  ?  Employer: UNEMPLOYED  ?Tobacco Use  ? Smoking status: Some Days  ?  Types: Cigars  ? Smokeless tobacco: Never  ?Vaping Use  ? Vaping Use: Never used  ?Substance and Sexual Activity  ? Alcohol use: No  ? Drug use: No  ? Sexual activity: Not on file  ? ? ? ?

## 2021-06-11 ENCOUNTER — Other Ambulatory Visit: Payer: Self-pay | Admitting: Orthopedic Surgery

## 2021-07-01 ENCOUNTER — Ambulatory Visit (INDEPENDENT_AMBULATORY_CARE_PROVIDER_SITE_OTHER): Payer: BC Managed Care – PPO | Admitting: Orthopedic Surgery

## 2021-07-01 ENCOUNTER — Other Ambulatory Visit: Payer: Self-pay

## 2021-07-01 DIAGNOSIS — Z96651 Presence of right artificial knee joint: Secondary | ICD-10-CM

## 2021-07-02 ENCOUNTER — Encounter: Payer: Self-pay | Admitting: Orthopedic Surgery

## 2021-07-02 NOTE — Progress Notes (Signed)
? ?Post-Op Visit Note ?  ?Patient: Caleb Garcia           ?Date of Birth: 1965-07-26           ?MRN: [id number] ?Visit Date: 07/01/2021 ?PCP: Shirline Frees, NP ? ? ?Assessment & Plan: ? ?Chief Complaint:  ?Chief Complaint  ?Patient presents with  ? Right Knee - Routine Post Op  ? ?Visit Diagnoses:  ?1. S/P total knee replacement, right   ? ? ?Plan: [name] is a 56 year old patient who underwent right total knee replacement 04/21/2021.  He stopped physical therapy.  He is 2-1/2 months out.  On exam he has range of motion 0-90.  Plan at this time is out of work for 6 more weeks.  Physical therapy upstairs 2 times a week for 6 weeks to try to get a little bit more flexion around 10 to 15 degrees so he can go downstairs.  5-week return for clinical recheck and decision at that time about returning to work ? ?Follow-Up Instructions: No follow-ups on file.  ? ?Orders:  ?Orders Placed This Encounter  ?Procedures  ? Ambulatory referral to Physical Therapy  ? ?No orders of the defined types were placed in this encounter. ? ? ?Imaging: ?No results found. ? ?PMFS History: ?Patient Active Problem List  ? Diagnosis Date Noted  ? Arthritis of right knee   ? S/P total knee arthroplasty, right 04/21/2021  ? Chest discomfort 07/28/2016  ? Controlled type 2 diabetes mellitus without complication (HCC) 11/26/2013  ? Bilateral shoulder pain 10/31/2013  ? Erectile dysfunction 04/18/2013  ? Mixed hyperlipidemia 11/26/2009  ? LIMB PAIN 07/23/2009  ? GERD 04/29/2009  ? Obesity 10/22/2008  ? OBSTRUCTIVE SLEEP APNEA 10/22/2008  ? Essential hypertension 10/22/2008  ? CARDIOMYOPATHY 10/22/2008  ? CONGESTIVE HEART FAILURE, MILD 10/22/2008  ? Chronic systolic heart failure (HCC) 10/16/2008  ? ?Past Medical History:  ?Diagnosis Date  ? Arthritis   ? Cardiomyopathy   ? Non-ischemic  ? CHF (congestive heart failure) (HCC)   ? Diabetes mellitus without complication (HCC)   ? HTN (hypertension)   ? Hyperlipidemia   ? Hypokalemia   ? Obesity   ?  OSA (obstructive sleep apnea)   ? Sleep apnea   ? had surgery- no cpap  ? Stroke [facility]) 10/2008  ? Systolic CHF (HCC)   ?  ?Family History  ?Problem Relation Age of Onset  ? Diabetes Mother   ? Hypertension Mother   ? Heart disease Mother   ? Cancer Father   ?     Throat cancer  ? Stroke Father   ? Stroke Brother 45  ? Prostate cancer Neg Hx   ? Colon cancer Neg Hx   ? Colon polyps Neg Hx   ? Esophageal cancer Neg Hx   ? Rectal cancer Neg Hx   ? Stomach cancer Neg Hx   ?  ?Past Surgical History:  ?Procedure Laterality Date  ? avulsion fracture of left hip    ? CARDIAC CATHETERIZATION  10/08/08  ? LEFT HEART CATH AND CORONARY ANGIOGRAPHY N/A 08/03/2016  ? Procedure: Left Heart Cath and Coronary Angiography;  Surgeon: Dolores Patty, MD;  Location: [facility] INVASIVE CV LAB;  Service: Cardiovascular;  Laterality: N/A;  ? TONSILLECTOMY    ? TOTAL KNEE ARTHROPLASTY Right 04/21/2021  ? Procedure: RIGHT TOTAL KNEE ARTHROPLASTY;  Surgeon: Cammy Copa, MD;  Location: [facility] OR;  Service: Orthopedics;  Laterality: Right;  ? UVULOPALATOPHARYNGOPLASTY    ? ?Social History  ? ?Occupational  History  ?  Employer: UNEMPLOYED  ?Tobacco Use  ? Smoking status: Some Days  ?  Types: Cigars  ? Smokeless tobacco: Never  ?Vaping Use  ? Vaping Use: Never used  ?Substance and Sexual Activity  ? Alcohol use: No  ? Drug use: No  ? Sexual activity: Not on file  ? ? ? ?

## 2021-07-03 ENCOUNTER — Encounter: Payer: Self-pay | Admitting: Rehabilitative and Restorative Service Providers"

## 2021-07-03 ENCOUNTER — Ambulatory Visit (INDEPENDENT_AMBULATORY_CARE_PROVIDER_SITE_OTHER): Payer: BC Managed Care – PPO | Admitting: Rehabilitative and Restorative Service Providers"

## 2021-07-03 DIAGNOSIS — R600 Localized edema: Secondary | ICD-10-CM | POA: Diagnosis not present

## 2021-07-03 DIAGNOSIS — M25661 Stiffness of right knee, not elsewhere classified: Secondary | ICD-10-CM | POA: Diagnosis not present

## 2021-07-03 DIAGNOSIS — M6281 Muscle weakness (generalized): Secondary | ICD-10-CM

## 2021-07-03 DIAGNOSIS — M25561 Pain in right knee: Secondary | ICD-10-CM

## 2021-07-03 DIAGNOSIS — R262 Difficulty in walking, not elsewhere classified: Secondary | ICD-10-CM | POA: Diagnosis not present

## 2021-07-03 NOTE — Therapy (Signed)
?OUTPATIENT PHYSICAL THERAPY TREATMENT NOTE ? ? ?Patient Name: Caleb Garcia ?MRN: [id number] ?DOB:1966/02/19, 56 y.o., male ?Today's Date: 07/03/2021 ? ?PCP: Shirline Frees, NP ?REFERRING PROVIDER: Cammy Copa, MD ? ?Progress Note ?Reporting Period 05/07/2021 to 06/05/2021 ? ?See note below for Objective Data and Assessment of Progress/Goals.  ? ?  ? PT End of Session - 07/03/21 1106   ? ? Visit Number 9   ? Number of Visits 18   ? Date for PT Re-Evaluation 07/02/21   ? Authorization - Number of Visits 30   ? Progress Note Due on Visit 18   ? PT Start Time 1102   ? PT Stop Time 1155   ? PT Time Calculation (min) 53 min   ? Activity Tolerance Patient tolerated treatment well   ? Behavior During Therapy [facility] for tasks assessed/performed   ? ?  ?  ? ?  ? ? ? ? ? ? ? ? ?Past Medical History:  ?Diagnosis Date  ? Arthritis   ? Cardiomyopathy   ? Non-ischemic  ? CHF (congestive heart failure) (HCC)   ? Diabetes mellitus without complication (HCC)   ? HTN (hypertension)   ? Hyperlipidemia   ? Hypokalemia   ? Obesity   ? OSA (obstructive sleep apnea)   ? Sleep apnea   ? had surgery- no cpap  ? Stroke [facility]) 10/2008  ? Systolic CHF (HCC)   ? ?Past Surgical History:  ?Procedure Laterality Date  ? avulsion fracture of left hip    ? CARDIAC CATHETERIZATION  10/08/08  ? LEFT HEART CATH AND CORONARY ANGIOGRAPHY N/A 08/03/2016  ? Procedure: Left Heart Cath and Coronary Angiography;  Surgeon: Dolores Patty, MD;  Location: [facility] INVASIVE CV LAB;  Service: Cardiovascular;  Laterality: N/A;  ? TONSILLECTOMY    ? TOTAL KNEE ARTHROPLASTY Right 04/21/2021  ? Procedure: RIGHT TOTAL KNEE ARTHROPLASTY;  Surgeon: Cammy Copa, MD;  Location: [facility];  Service: Orthopedics;  Laterality: Right;  ? UVULOPALATOPHARYNGOPLASTY    ? ?Patient Active Problem List  ? Diagnosis Date Noted  ? Arthritis of right knee   ? S/P total knee arthroplasty, right 04/21/2021  ? Chest discomfort 07/28/2016  ? Controlled type 2 diabetes mellitus without  complication (HCC) 11/26/2013  ? Bilateral shoulder pain 10/31/2013  ? Erectile dysfunction 04/18/2013  ? Mixed hyperlipidemia 11/26/2009  ? LIMB PAIN 07/23/2009  ? GERD 04/29/2009  ? Obesity 10/22/2008  ? OBSTRUCTIVE SLEEP APNEA 10/22/2008  ? Essential hypertension 10/22/2008  ? CARDIOMYOPATHY 10/22/2008  ? CONGESTIVE HEART FAILURE, MILD 10/22/2008  ? Chronic systolic heart failure (HCC) 10/16/2008  ? ? ?REFERRING DIAG: Z98.890 (ICD-10-CM) - Post-operative state Z96.659 (ICD-10-CM) - S/P TKR (total knee replacement)  ? ?ONSET DATE: 04/21/2021 ? ?THERAPY DIAG:  ?Difficulty in walking, not elsewhere classified ? ?Stiffness of right knee, not elsewhere classified ? ?Right knee pain, unspecified chronicity ? ?Localized edema ? ?Muscle weakness (generalized) ? ?PERTINENT HISTORY: Previous L hip fracture, cardiac history, DM and previous stroke. ? ?PRECAUTIONS: Knee ? ?SUBJECTIVE: Robby has been independent with his PT over the past month.  He is happy with his extension AROM but would like better flexion AROM and quadriceps strength and requests more supervised help with this. ? ?PAIN:  ?Are you having pain? Yes ?NPRS scale: 5/10 today, lowest 2/10 and highest 5/10 this week ?Pain location: knee ?Pain orientation: Right, Medial, Lateral, and Anterior (posterior 06/03/2021) ?PAIN TYPE: surgical ?Pain description: constant, sharp, aching, and sore   ?Aggravating factors: prolonged postures and  too much WB, very painful at night ?Relieving factors: exercises and ice, he is out of flexoril ? ?PATIENT GOALS Return to work, be able to walk normally and do stairs without pain. ?  ?  ?OBJECTIVE:  ?  ?DIAGNOSTIC FINDINGS: Keon has R knee edema, limited R knee AROM, poor quadriceps strength and gait impairments (B axillary crutches) in need of skilled care s/p R TKA. ?  ?PATIENT SURVEYS:  ?05/07/2021 FOTO 49 (Goal 68 in 14 visits) ?  ?COGNITION: ?         Overall cognitive status: Within functional limits for tasks assessed               ?          ?LE AROM/PROM: ?  ?A/PROM Right ?05/07/2021 Left ?05/07/2021 RLE ?05/13/21 R LE 05/27/2021 R LE 06/03/2021 R LE AROM 06/05/2021 R LE AROM 07/03/2021  ?Hip flexion           ?Hip extension           ?Hip abduction           ?Hip adduction           ?Hip internal rotation           ?Hip external rotation           ?Knee flexion 74 122 PROM 96* ?Seated after man 102 AROM 94 AROM (edema limited) 96 degrees AROM 101 degrees AROM  ?Knee extension -8 0 PROM -4* ?Supine after man -3 degrees AROM -2 degrees AROM -3 degrees AROM 0 degrees  ?Ankle dorsiflexion           ?Ankle plantarflexion           ?Ankle inversion           ?Ankle eversion           ? (Blank rows = not tested) ?  ?LE MMT: ?  ?MMT Deferred due to edema and time constraints Right ?05/07/2021 Left ?05/07/2021  ?Hip flexion      ?Hip extension      ?Hip abduction      ?Hip adduction      ?Hip internal rotation      ?Hip external rotation      ?Knee flexion      ?Knee extension      ?Ankle dorsiflexion      ?Ankle plantarflexion      ?Ankle inversion      ?Ankle eversion      ? (Blank rows = not tested) ?  ?  ?TODAY'S TREATMENT: ? ?07/03/2021 ?Therapeutic Exercise: ?Aerobic:  Recumbent bike Seat 8 & 7 for 8 minutes (4 minutes each) AAROM to full range ? ?Prone: Quadriceps stretch 5X 20 seconds with belt over opposite shoulder ? ?Seated: Tailgate knee flexion 1 minute;  ? ?AAROM knee flexion (L pushes R into flexion) 10X 10 seconds ? ?Functional Activities:  ?Step-down off 4 & 6 inch step 2 sets of 10 slow eccentrics for stairs, chairs and curbs ? ?Leg Press R leg only push into extension and stretch into flexion 15X slow eccentrics 75# ? ?Sit to stand slow eccentrics 5X ? ?Ball squats (for sit to stand) with 5 bounces 10X  ? ?Modalities:  Vaso R knee high 34* 10 minutes ? ? ?06/05/2021 ?Therapeutic Exercise: ?Aerobic:  Recumbent bike Seat 9 8 minutes AAROM to full range ? ?Supine: Quadriceps sets (R heel on towel roll) 2 sets of 10 for 5 seconds ? ?Seated  straight  leg raises 3 sets of 5 for 3 seconds ? ?Seated: Tailgate knee flexion 3 minute;  ? ?Hamstrings Isometrics 10X 5 seconds (30-50 a day at home) ? ? ?Modalities:  Vaso R knee high 34* 15 minutes ? ? ?06/03/2021 ?Therapeutic Exercise: ?Aerobic:  Recumbent bike Seat 10 8 minutes AAROM to full range ? ?Supine: Quadriceps sets (R heel on towel roll) 2 sets of 10 for 5 seconds ? ?Prone: ? ?Seated: [place]gate knee flexion 1 minute; AAROM (L pushes R into flexion) 10X 10 seconds ? ?Hamstrings Isometrics 10X 5 seconds (30-50 a day at home) ? ?Standing: ? ?Neuromuscular Re-education: Tandem walk forward and backward on foam; heel to toe balance: eyes open; head turning; eyes closed and compliant surface 1X each 20 seconds.  Also, single leg stand 4X 10 seconds each side. ? ?Manual Therapy:  ? ?Therapeutic Activity: FUNCTIONAL ACTIVITY leg press single-leg R only 10X 75# push into extension (5 seconds) and slow eccentrics for sit to stand, stairs, curbs ? ?Step-down and step-up and over 4 and 6 inch step slow eccentrics and emphasize R knee flexion ? ?Modalities:  Vaso R knee high 34* 10 minutes ? ? ?PATIENT EDUCATION:  ?Education details: HEP review with edema control, quadriceps and (now) hamstrings strength the primary emphasis.  Reviewed reassessment. ?Person educated: Patient ?Education method: Explanation, Demonstration, Tactile cues, Verbal cues, and Handouts ?Education comprehension: verbalized understanding, returned demonstration, verbal cues required, tactile cues required, and needs further education ?  ?  ?HOME EXERCISE PROGRAM: ? Access Code: [email or web address] ?URL: [email or web address] ?Date: 07/03/2021 ?Prepared by: Pauletta Browns ? ?Exercises ?- Supine Quadricep Sets  - 3-5 x daily - 7 x weekly - 2-3 sets - 10 reps - 5 second hold ?- Seated Knee Flexion AAROM  - 3 x daily - 7 x weekly - 1 sets - 1 reps - 3 minutes hold ?- Supine Knee Extension Strengthening  - 1 x daily - 7 x weekly - 2 sets - 10  reps ?- Supine Active Straight Leg Raise  - 2 x daily - 7 x weekly - 2 sets - 10 reps ?- Supine Heel Slide  - 1 x daily - 7 x weekly - 2 sets - 10 reps ?- Prone Quadricep Stretch with Strap  - 2-3 x daily - 7 x weekl

## 2021-07-06 ENCOUNTER — Encounter: Payer: Self-pay | Admitting: Physical Therapy

## 2021-07-06 ENCOUNTER — Ambulatory Visit (INDEPENDENT_AMBULATORY_CARE_PROVIDER_SITE_OTHER): Payer: BC Managed Care – PPO | Admitting: Physical Therapy

## 2021-07-06 DIAGNOSIS — M6281 Muscle weakness (generalized): Secondary | ICD-10-CM

## 2021-07-06 DIAGNOSIS — R262 Difficulty in walking, not elsewhere classified: Secondary | ICD-10-CM

## 2021-07-06 DIAGNOSIS — R600 Localized edema: Secondary | ICD-10-CM

## 2021-07-06 DIAGNOSIS — M25561 Pain in right knee: Secondary | ICD-10-CM

## 2021-07-06 DIAGNOSIS — M25661 Stiffness of right knee, not elsewhere classified: Secondary | ICD-10-CM | POA: Diagnosis not present

## 2021-07-06 NOTE — Therapy (Addendum)
?OUTPATIENT PHYSICAL THERAPY TREATMENT NOTE ? ? ?Patient Name: Caleb Garcia ?MRN: [id number] ?DOB:January 12, 1966, 56 y.o., male ?Today's Date: 07/06/2021 ? ?PCP: Caleb Peng, NP ?REFERRING PROVIDER: Meredith Pel, MD ? ? ? ?  ? PT End of Session - 07/06/21 1514   ? ? Visit Number 10   ? Number of Visits 18   ? Authorization - Number of Visits 30   ? Progress Note Due on Visit 18   ? PT Start Time [id number]   ? PT Stop Time 1515   ? PT Time Calculation (min) 42 min   ? Activity Tolerance Patient tolerated treatment well   ? Behavior During Therapy [facility] for tasks assessed/performed   ? ?  ?  ? ?  ? ? ? ? ? ? ? ? ? ?Past Medical History:  ?Diagnosis Date  ? Arthritis   ? Cardiomyopathy   ? Non-ischemic  ? CHF (congestive heart failure) ([place])   ? Diabetes mellitus without complication ([place])   ? HTN (hypertension)   ? Hyperlipidemia   ? Hypokalemia   ? Obesity   ? OSA (obstructive sleep apnea)   ? Sleep apnea   ? had surgery- no cpap  ? Stroke [facility]) 10/2008  ? Systolic CHF ([place])   ? ?Past Surgical History:  ?Procedure Laterality Date  ? avulsion fracture of left hip    ? CARDIAC CATHETERIZATION  10/08/08  ? LEFT HEART CATH AND CORONARY ANGIOGRAPHY N/A 08/03/2016  ? Procedure: Left Heart Cath and Coronary Angiography;  Surgeon: Jolaine Artist, MD;  Location: [place] CV LAB;  Service: Cardiovascular;  Laterality: N/A;  ? TONSILLECTOMY    ? TOTAL KNEE ARTHROPLASTY Right 04/21/2021  ? Procedure: RIGHT TOTAL KNEE ARTHROPLASTY;  Surgeon: Caleb Pel, MD;  Location: [place];  Service: Orthopedics;  Laterality: Right;  ? UVULOPALATOPHARYNGOPLASTY    ? ?Patient Active Problem List  ? Diagnosis Date Noted  ? Arthritis of right knee   ? S/P total knee arthroplasty, right 04/21/2021  ? Chest discomfort 07/28/2016  ? Controlled type 2 diabetes mellitus without complication ([place]) 0000000  ? Bilateral shoulder pain 10/31/2013  ? Erectile dysfunction 04/18/2013  ? Mixed hyperlipidemia 11/26/2009  ? LIMB PAIN 07/23/2009  ?  GERD 04/29/2009  ? Obesity 10/22/2008  ? OBSTRUCTIVE SLEEP APNEA 10/22/2008  ? Essential hypertension 10/22/2008  ? CARDIOMYOPATHY 10/22/2008  ? CONGESTIVE HEART FAILURE, MILD 10/22/2008  ? Chronic systolic heart failure ([place]) 10/16/2008  ? ? ?REFERRING DIAG: Z98.890 (ICD-10-CM) - Post-operative state Z96.659 (ICD-10-CM) - S/P TKR (total knee replacement)  ? ?ONSET DATE: 04/21/2021 ? ?THERAPY DIAG:  ?Difficulty in walking, not elsewhere classified ? ?Stiffness of right knee, not elsewhere classified ? ?Localized edema ? ?Right knee pain, unspecified chronicity ? ?Muscle weakness (generalized) ? ?PERTINENT HISTORY: Previous L hip fracture, cardiac history, DM and previous stroke. ? ?PRECAUTIONS: Knee ? ?SUBJECTIVE: Pt arriving to therapy reporting compliance in his HEP and mild pain reported in his Rt knee. Pt reporting more stiffness first thing in the morning.  ? ?PAIN:  ?Are you having pain? Yes ?NPRS scale: 5/10 today, lowest 2/10 and highest 5/10 this week ?Pain location: knee ?Pain orientation: Right, Medial, Lateral, and Anterior  ?PAIN TYPE: surgical ?Pain description: constant, sharp, aching, and sore   ?Aggravating factors: prolonged postures and too much WB, very painful at night ?Relieving factors: exercises and ice, he is out of flexoril ? ?PATIENT GOALS Return to work, be able to walk normally and do stairs without pain. ?  ?  ?  OBJECTIVE:  ?  ?DIAGNOSTIC FINDINGS: Caleb Garcia has R knee edema, limited R knee AROM, poor quadriceps strength and gait impairments (B axillary crutches) in need of skilled care s/p R TKA. ?  ?PATIENT SURVEYS:  ?05/07/2021 FOTO 49 (Goal 68 in 14 visits) ?  ?COGNITION: ?         Overall cognitive status: Within functional limits for tasks assessed              ?          ?LE AROM/PROM: ?  ?A/PROM Right ?05/07/2021 Left ?05/07/2021 RLE ?05/13/21 R LE 05/27/2021 R LE 06/03/2021 R LE AROM 06/05/2021 R LE AROM 07/03/2021 Rt LE AROM ?07/06/2021  ?Hip flexion            ?Hip extension            ?Hip  abduction            ?Hip adduction            ?Hip internal rotation            ?Hip external rotation            ?Knee flexion 74 122 PROM 96* ?Seated after man 102 AROM 94 AROM (edema limited) 96 degrees AROM 101 degrees AROM 102 degrees ?PROM: 105 degrees  ?Knee extension -8 0 PROM -4* ?Supine after man -3 degrees AROM -2 degrees AROM -3 degrees AROM 0 degrees 0 degrees  ?Ankle dorsiflexion            ?Ankle plantarflexion            ?Ankle inversion            ?Ankle eversion            ? (Blank rows = not tested) ?  ?LE MMT: ?  ?MMT Deferred due to edema and time constraints Right ?05/07/2021 Left ?05/07/2021  ?Hip flexion      ?Hip extension      ?Hip abduction      ?Hip adduction      ?Hip internal rotation      ?Hip external rotation      ?Knee flexion      ?Knee extension      ?Ankle dorsiflexion      ?Ankle plantarflexion      ?Ankle inversion      ?Ankle eversion      ? (Blank rows = not tested) ?  ?  ?TODAY'S TREATMENT: ?07/06/2021:  ? Aerobic: Scifit bike seat at 11 Level 3 for 8 minutes ? Leg Press: 75# Rt LE only 3 x 15 ? Wall squats x 15 ? RT LAQ: 5# 2x10 ? Calf raises: x 15 ? Vectors with sliding disc toward 3 cones x 10 ? Contract/Relax x 10  ? AROM: 0-102 degrees ? PROM; 0-105 degrees ?   ? ?07/03/2021 ?Therapeutic Exercise: ?Aerobic:  Recumbent bike Seat 8 & 7 for 8 minutes (4 minutes each) AAROM to full range ? ?Prone: Quadriceps stretch 5X 20 seconds with belt over opposite shoulder ? ?Seated: Tailgate knee flexion 1 minute;  ? ?AAROM knee flexion (L pushes R into flexion) 10X 10 seconds ? ?Functional Activities:  ?Step-down off 4 & 6 inch step 2 sets of 10 slow eccentrics for stairs, chairs and curbs ? ?Leg Press R leg only push into extension and stretch into flexion 15X slow eccentrics 75# ? ?Sit to stand slow eccentrics 5X ? ?Ball squats (for sit to stand) with 5 bounces  10X  ? ?Modalities:  Vaso R knee high 34* 10 minutes ? ? ?06/05/2021 ?Therapeutic Exercise: ?Aerobic:  Recumbent bike Seat 9 8  minutes AAROM to full range ? ?Supine: Quadriceps sets (R heel on towel roll) 2 sets of 10 for 5 seconds ? ?Seated straight leg raises 3 sets of 5 for 3 seconds ? ?Seated: Tailgate knee flexion 3 minute;  ? ?Hamstrings Isometrics 10X 5 seconds (30-50 a day at home) ? ? ?Modalities:  Vaso R knee high 34* 15 minutes ? ? ?06/03/2021 ?Therapeutic Exercise: ?Aerobic:  Recumbent bike Seat 10 8 minutes AAROM to full range ? ?Supine: Quadriceps sets (R heel on towel roll) 2 sets of 10 for 5 seconds ? ?Prone: ? ?Seated: Tailgate knee flexion 1 minute; AAROM (L pushes R into flexion) 10X 10 seconds ? ?Hamstrings Isometrics 10X 5 seconds (30-50 a day at home) ? ?Standing: ? ?Neuromuscular Re-education: Tandem walk forward and backward on foam; heel to toe balance: eyes open; head turning; eyes closed and compliant surface 1X each 20 seconds.  Also, single leg stand 4X 10 seconds each side. ? ?Manual Therapy:  ? ?Therapeutic Activity: FUNCTIONAL ACTIVITY leg press single-leg R only 10X 75# push into extension (5 seconds) and slow eccentrics for sit to stand, stairs, curbs ? ?Step-down and step-up and over 4 and 6 inch step slow eccentrics and emphasize R knee flexion ? ?Modalities:  Vaso R knee high 34* 10 minutes ? ? ?PATIENT EDUCATION:  ?Education details: HEP review with edema control, quadriceps and (now) hamstrings strength the primary emphasis.  Reviewed reassessment. ?Person educated: Patient ?Education method: Explanation, Demonstration, Tactile cues, Verbal cues, and Handouts ?Education comprehension: verbalized understanding, returned demonstration, verbal cues required, tactile cues required, and needs further education ?  ?  ?HOME EXERCISE PROGRAM: ? Access Code: [email or web address] ?URL: [email or web address] ?Date: 07/03/2021 ?Prepared by: Vista Mink ? ?Exercises ?- Supine Quadricep Sets  - 3-5 x daily - 7 x weekly - 2-3 sets - 10 reps - 5 second hold ?- Seated Knee Flexion AAROM  - 3 x daily - 7 x weekly - 1  sets - 1 reps - 3 minutes hold ?- Supine Knee Extension Strengthening  - 1 x daily - 7 x weekly - 2 sets - 10 reps ?- Supine Active Straight Leg Raise  - 2 x daily - 7 x weekly - 2 sets - 10 reps ?- Supine He

## 2021-07-07 ENCOUNTER — Ambulatory Visit (INDEPENDENT_AMBULATORY_CARE_PROVIDER_SITE_OTHER): Payer: BC Managed Care – PPO | Admitting: Rehabilitative and Restorative Service Providers"

## 2021-07-07 ENCOUNTER — Other Ambulatory Visit: Payer: Self-pay

## 2021-07-07 ENCOUNTER — Encounter: Payer: Self-pay | Admitting: Rehabilitative and Restorative Service Providers"

## 2021-07-07 DIAGNOSIS — R262 Difficulty in walking, not elsewhere classified: Secondary | ICD-10-CM

## 2021-07-07 DIAGNOSIS — M25561 Pain in right knee: Secondary | ICD-10-CM

## 2021-07-07 DIAGNOSIS — R600 Localized edema: Secondary | ICD-10-CM | POA: Diagnosis not present

## 2021-07-07 DIAGNOSIS — M25661 Stiffness of right knee, not elsewhere classified: Secondary | ICD-10-CM | POA: Diagnosis not present

## 2021-07-07 DIAGNOSIS — M6281 Muscle weakness (generalized): Secondary | ICD-10-CM

## 2021-07-07 NOTE — Therapy (Signed)
?OUTPATIENT PHYSICAL THERAPY TREATMENT NOTE ? ? ?Patient Name: Caleb Garcia ?MRN: [id number] ?DOB:05-14-1965, 56 y.o., male ?Today's Date: 07/07/2021 ? ?PCP: Shirline Frees, NP ?REFERRING PROVIDER: Cammy Copa, MD ? ?  ? PT End of Session - 07/07/21 8657   ? ? Visit Number 11   ? Number of Visits 18   ? Date for PT Re-Evaluation 07/31/21   ? Authorization - Number of Visits 30   ? Progress Note Due on Visit 18   ? PT Start Time 1554   ? PT Stop Time 1633   ? PT Time Calculation (min) 39 min   ? Activity Tolerance Patient tolerated treatment well   ? Behavior During Therapy [facility] for tasks assessed/performed   ? ?  ?  ? ?  ? ? ? ? ?Past Medical History:  ?Diagnosis Date  ? Arthritis   ? Cardiomyopathy   ? Non-ischemic  ? CHF (congestive heart failure) (HCC)   ? Diabetes mellitus without complication (HCC)   ? HTN (hypertension)   ? Hyperlipidemia   ? Hypokalemia   ? Obesity   ? OSA (obstructive sleep apnea)   ? Sleep apnea   ? had surgery- no cpap  ? Stroke [facility]) 10/2008  ? Systolic CHF (HCC)   ? ?Past Surgical History:  ?Procedure Laterality Date  ? avulsion fracture of left hip    ? CARDIAC CATHETERIZATION  10/08/08  ? LEFT HEART CATH AND CORONARY ANGIOGRAPHY N/A 08/03/2016  ? Procedure: Left Heart Cath and Coronary Angiography;  Surgeon: Dolores Patty, MD;  Location: [facility] INVASIVE CV LAB;  Service: Cardiovascular;  Laterality: N/A;  ? TONSILLECTOMY    ? TOTAL KNEE ARTHROPLASTY Right 04/21/2021  ? Procedure: RIGHT TOTAL KNEE ARTHROPLASTY;  Surgeon: Cammy Copa, MD;  Location: [facility] OR;  Service: Orthopedics;  Laterality: Right;  ? UVULOPALATOPHARYNGOPLASTY    ? ?Patient Active Problem List  ? Diagnosis Date Noted  ? Arthritis of right knee   ? S/P total knee arthroplasty, right 04/21/2021  ? Chest discomfort 07/28/2016  ? Controlled type 2 diabetes mellitus without complication (HCC) 11/26/2013  ? Bilateral shoulder pain 10/31/2013  ? Erectile dysfunction 04/18/2013  ? Mixed hyperlipidemia 11/26/2009  ?  LIMB PAIN 07/23/2009  ? GERD 04/29/2009  ? Obesity 10/22/2008  ? OBSTRUCTIVE SLEEP APNEA 10/22/2008  ? Essential hypertension 10/22/2008  ? CARDIOMYOPATHY 10/22/2008  ? CONGESTIVE HEART FAILURE, MILD 10/22/2008  ? Chronic systolic heart failure (HCC) 10/16/2008  ? ? ?REFERRING DIAG: Z98.890 (ICD-10-CM) - Post-operative state Z96.659 (ICD-10-CM) - S/P TKR (total knee replacement)  ? ?ONSET DATE: 04/21/2021 ? ?THERAPY DIAG:  ?Difficulty in walking, not elsewhere classified ? ?Localized edema ? ?Stiffness of right knee, not elsewhere classified ? ?Right knee pain, unspecified chronicity ? ?Muscle weakness (generalized) ? ?PERTINENT HISTORY: Previous L hip fracture, cardiac history, DM and previous stroke. ? ?PRECAUTIONS: Knee ? ?SUBJECTIVE: Pt indicated higher number 4/10 in last 24 hours.  ? ?PAIN:  ?Are you having pain? Yes ?NPRS scale: 4/10 ?Pain location: knee ?Pain orientation: Right ?PAIN TYPE: surgical ?Pain description soreness, achy ?Aggravating factors: increased activity, spontaneous with nerves returning ?Relieving factors: exercises and ice ? ?PATIENT GOALS Return to work, be able to walk normally and do stairs without pain. ?  ?  ?OBJECTIVE:  ?  ?DIAGNOSTIC FINDINGS: Brown has R knee edema, limited R knee AROM, poor quadriceps strength and gait impairments (B axillary crutches) in need of skilled care s/p R TKA. ?  ?PATIENT SURVEYS:  ?05/07/2021 FOTO 49 (  Goal 68 in 14 visits) ?  ?COGNITION: ?         Overall cognitive status: Within functional limits for tasks assessed              ?          ?LE AROM/PROM: ?  ?A/PROM Right ?05/07/2021 Left ?05/07/2021 RLE ?05/13/21 R LE 05/27/2021 R LE 06/03/2021 R LE AROM 06/05/2021 R LE AROM 07/03/2021 Rt LE AROM ?4/3/ 2023  ?Hip flexion            ?Hip extension            ?Hip abduction            ?Hip adduction            ?Hip internal rotation            ?Hip external rotation            ?Knee flexion 74 122 PROM 96* ?Seated after man 102 AROM 94 AROM (edema limited) 96 degrees  AROM 101 degrees AROM 102 degrees ?PROM: 105 degrees  ?Knee extension -8 0 PROM -4* ?Supine after man -3 degrees AROM -2 degrees AROM -3 degrees AROM 0 degrees 0 degrees  ?Ankle dorsiflexion            ?Ankle plantarflexion            ?Ankle inversion            ?Ankle eversion            ? (Blank rows = not tested) ?  ?LE MMT: ?  ?MMT  Right ?05/07/2021 Left ?05/07/2021  ?Hip flexion      ?Hip extension      ?Hip abduction      ?Hip adduction      ?Hip internal rotation      ?Hip external rotation      ?Knee flexion      ?Knee extension      ?Ankle dorsiflexion      ?Ankle plantarflexion      ?Ankle inversion      ?Ankle eversion      ? (Blank rows = not tested) ?  ?  ?TODAY'S TREATMENT: ?07/07/2021:  ?Therex: ?Scifit bike seat at 11 Level 3 for 8 minutes ?Knee extension Double leg up, Rt leg lowering eccentrically 15 lbs x 10, 20 lbs 2 x 10 ?Seated Rt knee flexion stretch c overpressure from Lt leg 15 sec x 5 ?  ?  ?TherActivity: ? Step up forward 6 inch c TKE blue band 2 x 10 Rt leg ? Lateral step down 4 inch on Rt leg x 10 (pain in knee noted) ? 18 inch table transfer slow lowering x 10 ? ?Manual: ? Seated Rt knee flexion c IR/distraction mobilization c movement c contralateral leg movement opposite. ER/IR tibia mobilizations g3 ? ?07/06/2021:  ? Aerobic: Scifit bike seat at 11 Level 3 for 8 minutes ? Leg Press: 75# Rt LE only 3 x 15 ? Wall squats x 15 ? RT LAQ: 5# 2x10 ? Calf raises: x 15 ? Vectors with sliding disc toward 3 cones x 10 ? Contract/Relax x 10  ? AROM: 0-102 degrees ? PROM; 0-105 degrees ?   ? ?07/03/2021 ?Therapeutic Exercise: ?Aerobic:  Recumbent bike Seat 8 & 7 for 8 minutes (4 minutes each) AAROM to full range ? ?Prone: Quadriceps stretch 5X 20 seconds with belt over opposite shoulder ? ?Seated: [place]gate knee flexion 1 minute;  ? ?AAROM  knee flexion (L pushes R into flexion) 10X 10 seconds ? ?Functional Activities:  ?Step-down off 4 & 6 inch step 2 sets of 10 slow eccentrics for stairs, chairs and  curbs ? ?Leg Press R leg only push into extension and stretch into flexion 15X slow eccentrics 75# ? ?Sit to stand slow eccentrics 5X ? ?Ball squats (for sit to stand) with 5 bounces 10X  ? ?Modalities:  Vaso R knee high 34* 10 minutes ? ? ?06/05/2021 ?Therapeutic Exercise: ?Aerobic:  Recumbent bike Seat 9 8 minutes AAROM to full range ? ?Supine: Quadriceps sets (R heel on towel roll) 2 sets of 10 for 5 seconds ? ?Seated straight leg raises 3 sets of 5 for 3 seconds ? ?Seated: [place]gate knee flexion 3 minute;  ? ?Hamstrings Isometrics 10X 5 seconds (30-50 a day at home) ? ? ?Modalities:  Vaso R knee high 34* 15 minutes ? ? ? ?PATIENT EDUCATION:  ?Education details: HEP review with edema control, quadriceps and (now) hamstrings strength the primary emphasis.  Reviewed reassessment. ?Person educated: Patient ?Education method: Explanation, Demonstration, Tactile cues, Verbal cues, and Handouts ?Education comprehension: verbalized understanding, returned demonstration, verbal cues required, tactile cues required, and needs further education ?  ?  ?HOME EXERCISE PROGRAM: ? Access Code: [email or web address] ?URL: [email or web address] ?Date: 07/03/2021 ?Prepared by: Pauletta Browns ? ?Exercises ?- Supine Quadricep Sets  - 3-5 x daily - 7 x weekly - 2-3 sets - 10 reps - 5 second hold ?- Seated Knee Flexion AAROM  - 3 x daily - 7 x weekly - 1 sets - 1 reps - 3 minutes hold ?- Supine Knee Extension Strengthening  - 1 x daily - 7 x weekly - 2 sets - 10 reps ?- Supine Active Straight Leg Raise  - 2 x daily - 7 x weekly - 2 sets - 10 reps ?- Supine Heel Slide  - 1 x daily - 7 x weekly - 2 sets - 10 reps ?- Prone Quadricep Stretch with Strap  - 2-3 x daily - 7 x weekly - 1 sets - 5 reps - 20 seconds hold ?- Sit to Stand with Armchair  - 1 x daily - 7 x weekly - 2-3 sets - 10 reps ?- Lateral Step Down  - 1 x daily - 7 x weekly - 2-3 sets - 10 reps ? ?ASSESSMENT: ?  ?CLINICAL IMPRESSION: Progression in strength intervention c good  response in [place] activity.  Step activity revealed some complaints in eccentric lowering despite lowered step height. Adjusted today c focus on step up activity.  Continued flexion gains to help improve fun

## 2021-07-15 ENCOUNTER — Telehealth: Payer: Self-pay

## 2021-07-15 NOTE — Telephone Encounter (Signed)
Patient came into the office stating that he would like his office visit notes faxed to [facility]. The last two visits were 07/01/21-06/10/21 ? ?Fax # [phone] ?

## 2021-07-15 NOTE — Telephone Encounter (Signed)
faxed

## 2021-07-16 ENCOUNTER — Encounter: Payer: Self-pay | Admitting: Rehabilitative and Restorative Service Providers"

## 2021-07-16 ENCOUNTER — Ambulatory Visit (INDEPENDENT_AMBULATORY_CARE_PROVIDER_SITE_OTHER): Payer: BC Managed Care – PPO | Admitting: Rehabilitative and Restorative Service Providers"

## 2021-07-16 DIAGNOSIS — M25661 Stiffness of right knee, not elsewhere classified: Secondary | ICD-10-CM

## 2021-07-16 DIAGNOSIS — R600 Localized edema: Secondary | ICD-10-CM | POA: Diagnosis not present

## 2021-07-16 DIAGNOSIS — M25561 Pain in right knee: Secondary | ICD-10-CM

## 2021-07-16 DIAGNOSIS — M6281 Muscle weakness (generalized): Secondary | ICD-10-CM

## 2021-07-16 DIAGNOSIS — R262 Difficulty in walking, not elsewhere classified: Secondary | ICD-10-CM | POA: Diagnosis not present

## 2021-07-16 NOTE — Therapy (Signed)
?OUTPATIENT PHYSICAL THERAPY TREATMENT/PROGRESS NOTE ? ? ?Patient Name: Caleb Garcia ?MRN: [id number] ?DOB:10/17/1965, 56 y.o., male ?Today's Date: 07/16/2021 ? ?PCP: Dorothyann Peng, NP ?REFERRING PROVIDER: Meredith Pel, MD ? ?  ? PT End of Session - 07/16/21 1531   ? ? Visit Number 12   ? Number of Visits 18   ? Date for PT Re-Evaluation 07/31/21   ? Authorization - Number of Visits 30   ? Progress Note Due on Visit 18   ? PT Start Time 1520   ? PT Stop Time 1615   ? PT Time Calculation (min) 55 min   ? Activity Tolerance Patient tolerated treatment well   ? Behavior During Therapy [facility] for tasks assessed/performed   ? ?  ?  ? ?  ? ? ? ? ? ?Past Medical History:  ?Diagnosis Date  ? Arthritis   ? Cardiomyopathy   ? Non-ischemic  ? CHF (congestive heart failure) ([place])   ? Diabetes mellitus without complication ([place])   ? HTN (hypertension)   ? Hyperlipidemia   ? Hypokalemia   ? Obesity   ? OSA (obstructive sleep apnea)   ? Sleep apnea   ? had surgery- no cpap  ? Stroke [facility]) 10/2008  ? Systolic CHF ([place])   ? ?Past Surgical History:  ?Procedure Laterality Date  ? avulsion fracture of left hip    ? CARDIAC CATHETERIZATION  10/08/08  ? LEFT HEART CATH AND CORONARY ANGIOGRAPHY N/A 08/03/2016  ? Procedure: Left Heart Cath and Coronary Angiography;  Surgeon: Jolaine Artist, MD;  Location: [place] CV LAB;  Service: Cardiovascular;  Laterality: N/A;  ? TONSILLECTOMY    ? TOTAL KNEE ARTHROPLASTY Right 04/21/2021  ? Procedure: RIGHT TOTAL KNEE ARTHROPLASTY;  Surgeon: Meredith Pel, MD;  Location: [place];  Service: Orthopedics;  Laterality: Right;  ? UVULOPALATOPHARYNGOPLASTY    ? ?Patient Active Problem List  ? Diagnosis Date Noted  ? Arthritis of right knee   ? S/P total knee arthroplasty, right 04/21/2021  ? Chest discomfort 07/28/2016  ? Controlled type 2 diabetes mellitus without complication ([place]) 66/29/4765  ? Bilateral shoulder pain 10/31/2013  ? Erectile dysfunction 04/18/2013  ? Mixed hyperlipidemia  11/26/2009  ? LIMB PAIN 07/23/2009  ? GERD 04/29/2009  ? Obesity 10/22/2008  ? OBSTRUCTIVE SLEEP APNEA 10/22/2008  ? Essential hypertension 10/22/2008  ? CARDIOMYOPATHY 10/22/2008  ? CONGESTIVE HEART FAILURE, MILD 10/22/2008  ? Chronic systolic heart failure ([place]) 10/16/2008  ? ? ?REFERRING DIAG: Z98.890 (ICD-10-CM) - Post-operative state Z96.659 (ICD-10-CM) - S/P TKR (total knee replacement)  ? ?ONSET DATE: 04/21/2021 ? ?THERAPY DIAG:  ?Difficulty in walking, not elsewhere classified ? ?Localized edema ? ?Stiffness of right knee, not elsewhere classified ? ?Right knee pain, unspecified chronicity ? ?Muscle weakness (generalized) ? ?PERTINENT HISTORY: Previous L hip fracture, cardiac history, DM and previous stroke. ? ?PRECAUTIONS: Knee ? ?SUBJECTIVE: Johncarlos reports noticeable progress since re-starting PT. ? ?PAIN:  ?Are you having pain? Yes ?NPRS scale: 1-4/10 ?Pain location: Knee ?Pain orientation: Right ?PAIN TYPE: surgical ?Pain description soreness, achy ?Aggravating factors: increased activity, spontaneous with nerves returning ?Relieving factors: exercises and ice ? ?PATIENT GOALS Return to work, be able to walk normally and do stairs without pain. ?  ?  ?OBJECTIVE:  ?  ?DIAGNOSTIC FINDINGS: Juelz has R knee edema, limited R knee AROM, poor quadriceps strength and gait impairments (B axillary crutches) in need of skilled care s/p R TKA. ?  ?PATIENT SURVEYS:  ?07/16/2021 FOTO 69, Goal met) ?  05/07/2021 FOTO 49 (Goal 68 in 14 visits) ?  ?COGNITION: ?         Overall cognitive status: Within functional limits for tasks assessed              ?          ?LE AROM/PROM: ?  ?A/PROM Right ?05/07/2021 Left ?05/07/2021 RLE ?05/13/21 R LE 05/27/2021 R LE 06/03/2021 R LE AROM 06/05/2021 R LE AROM 07/03/2021 Rt LE AROM ?4/3/ 2023 L/R in degrees assessed supine 07/16/2021  ?Hip flexion             ?Hip extension             ?Hip abduction             ?Hip adduction             ?Hip internal rotation             ?Hip external rotation              ?Knee flexion 74 122 PROM 96* ?Seated after man 102 AROM 94 AROM (edema limited) 96 degrees AROM 101 degrees AROM 102 degrees ?PROM: 105 degrees 107 degrees  ?Knee extension -8 0 PROM -4* ?Supine after man -3 degrees AROM -2 degrees AROM -3 degrees AROM 0 degrees 0 degrees 0 degrees  ?Ankle dorsiflexion             ?Ankle plantarflexion             ?Ankle inversion             ?Ankle eversion             ? (Blank rows = not tested) ?  ?LE MMT: ?  ?MMT  Right ?05/07/2021 Left ?05/07/2021 L/R thigh girth in cm assessed 15 cm proximal to the superior patellar pole ?  ?Hip flexion       ?Hip extension       ?Hip abduction       ?Hip adduction       ?Hip internal rotation       ?Hip external rotation       ?Knee flexion       ?Knee extension     58.5/58.0  ?Ankle dorsiflexion       ?Ankle plantarflexion       ?Ankle inversion       ?Ankle eversion       ? (Blank rows = not tested) ?  ?  ?TODAY'S TREATMENT: ?07/16/2021: ?Therapeutic Exercise: ?Aerobic: Recumbent bike Seat 7 & 6 for 8 minutes (4 minutes each) Full range ? ?Prone: Quadriceps stretch 5X 20 seconds with belt over opposite shoulder ? ?Seated: T[place]gate knee flexion 1 minute;  ? ?AAROM knee flexion (L pushes R into flexion) 5X 10 seconds ?   ?Knee extension machine (up with B, 90-40 degrees, slow eccentrics down 1 leg only) 2 sets of 5 R and 1 set of 5 L 25# ?    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 ?Step-down off 8 inch step 2 sets of 10 B with slow eccentrics ? ?Leg Press R leg only push quickly into extension and slow into flexion 15X slow eccentrics 100# ? ?Sit to stand slow eccentrics 10X ? ?Modalities:  Vaso R knee high 34* 10 minutes ? ? ?07/07/2021:  ?Therex: ?Scifit bike seat at 11 Level 3 for 8 minutes ?  Knee extension Double leg up, Rt leg lowering eccentrically 15 lbs x 10, 20 lbs 2 x 10 ?Seated Rt knee flexion stretch c overpressure from Lt leg 15 sec x  5 ?  ?  ?TherActivity: ? Step up forward 6 inch c TKE blue band 2 x 10 Rt leg ? Lateral step down 4 inch on Rt leg x 10 (pain in knee noted) ? 18 inch table transfer slow lowering x 10 ? ?Manual: ? Seated Rt knee flexion c IR/distraction mobilization c movement c contralateral leg movement opposite. ER/IR tibia mobilizations g3 ? ? ?07/06/2021:  ? Aerobic: Scifit bike seat at 11 Level 3 for 8 minutes ? Leg Press: 75# Rt LE only 3 x 15 ? Wall squats x 15 ? RT LAQ: 5# 2x10 ? Calf raises: x 15 ? Vectors with sliding disc toward 3 cones x 10 ? Contract/Relax x 10  ? AROM: 0-102 degrees ? PROM; 0-105 degrees ?   ? ? ?PATIENT EDUCATION:  ?Education details: HEP review with edema control, flexion AROM and quadriceps strength as the primary emphasis.  Reviewed reassessment. ?Person educated: Patient ?Education method: Explanation, Demonstration, Tactile cues, Verbal cues, and Handouts ?Education comprehension: verbalized understanding, returned demonstration, verbal cues required, tactile cues required, and needs further education ?  ?  ?HOME EXERCISE PROGRAM: ? Access Code: [email or web address] ?URL: [email or web address] ?Date: 07/03/2021 ?Prepared by: Vista Mink ? ?Exercises ?- Supine Quadricep Sets  - 3-5 x daily - 7 x weekly - 2-3 sets - 10 reps - 5 second hold ?- Seated Knee Flexion AAROM  - 3 x daily - 7 x weekly - 1 sets - 1 reps - 3 minutes hold ?- Supine Knee Extension Strengthening  - 1 x daily - 7 x weekly - 2 sets - 10 reps ?- Supine Active Straight Leg Raise  - 2 x daily - 7 x weekly - 2 sets - 10 reps ?- Supine Heel Slide  - 1 x daily - 7 x weekly - 2 sets - 10 reps ?- Prone Quadricep Stretch with Strap  - 2-3 x daily - 7 x weekly - 1 sets - 5 reps - 20 seconds hold ?- Sit to Stand with Armchair  - 1 x daily - 7 x weekly - 2-3 sets - 10 reps ?- Lateral Step Down  - 1 x daily - 7 x weekly - 2-3 sets - 10 reps ? ?ASSESSMENT: ?  ?CLINICAL IMPRESSION: AROM is 0-107 degrees.  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  He does have some pain with the large step, flexion AROM and quadriceps strength with still benefit from continued work, particularly given

## 2021-07-20 ENCOUNTER — Encounter: Payer: Self-pay | Admitting: Rehabilitative and Restorative Service Providers"

## 2021-07-20 ENCOUNTER — Ambulatory Visit (INDEPENDENT_AMBULATORY_CARE_PROVIDER_SITE_OTHER): Payer: BC Managed Care – PPO | Admitting: Rehabilitative and Restorative Service Providers"

## 2021-07-20 ENCOUNTER — Other Ambulatory Visit: Payer: Self-pay

## 2021-07-20 DIAGNOSIS — R600 Localized edema: Secondary | ICD-10-CM | POA: Diagnosis not present

## 2021-07-20 DIAGNOSIS — M25561 Pain in right knee: Secondary | ICD-10-CM | POA: Diagnosis not present

## 2021-07-20 DIAGNOSIS — M25661 Stiffness of right knee, not elsewhere classified: Secondary | ICD-10-CM | POA: Diagnosis not present

## 2021-07-20 DIAGNOSIS — R262 Difficulty in walking, not elsewhere classified: Secondary | ICD-10-CM

## 2021-07-20 DIAGNOSIS — M6281 Muscle weakness (generalized): Secondary | ICD-10-CM

## 2021-07-20 NOTE — Therapy (Signed)
?OUTPATIENT PHYSICAL THERAPY TREATMENT ? ? ?Patient Name: Caleb Garcia ?MRN: [id number] ?DOB:07/20/1965, 55 y.o., male ?Today's Date: 07/20/2021 ? ?PCP: Nafziger, Cory, NP ?REFERRING PROVIDER: Dean, Gregory Scott, MD ? ?  ? PT End of Session - 07/20/21 1353   ? ? Visit Number 13   ? Number of Visits 18   ? Date for PT Re-Evaluation 07/31/21   ? Authorization - Number of Visits 30   ? Progress Note Due on Visit 18   ? PT Start Time 1341   ? PT Stop Time 1430   ? PT Time Calculation (min) 49 min   ? Activity Tolerance Patient tolerated treatment well   ? Behavior During Therapy WFL for tasks assessed/performed   ? ?  ?  ? ?  ? ? ? ? ? ? ?Past Medical History:  ?Diagnosis Date  ? Arthritis   ? Cardiomyopathy   ? Non-ischemic  ? CHF (congestive heart failure) (HCC)   ? Diabetes mellitus without complication (HCC)   ? HTN (hypertension)   ? Hyperlipidemia   ? Hypokalemia   ? Obesity   ? OSA (obstructive sleep apnea)   ? Sleep apnea   ? had surgery- no cpap  ? Stroke (HCC) 10/2008  ? Systolic CHF (HCC)   ? ?Past Surgical History:  ?Procedure Laterality Date  ? avulsion fracture of left hip    ? CARDIAC CATHETERIZATION  10/08/08  ? LEFT HEART CATH AND CORONARY ANGIOGRAPHY N/A 08/03/2016  ? Procedure: Left Heart Cath and Coronary Angiography;  Surgeon: Daniel R Bensimhon, MD;  Location: [facility];  Service: Cardiovascular;  Laterality: N/A;  ? TONSILLECTOMY    ? TOTAL KNEE ARTHROPLASTY Right 04/21/2021  ? Procedure: RIGHT TOTAL KNEE ARTHROPLASTY;  Surgeon: Dean, Gregory Scott, MD;  Location: [facility];  Service: Orthopedics;  Laterality: Right;  ? UVULOPALATOPHARYNGOPLASTY    ? ?Patient Active Problem List  ? Diagnosis Date Noted  ? Arthritis of right knee   ? S/P total knee arthroplasty, right 04/21/2021  ? Chest discomfort 07/28/2016  ? Controlled type 2 diabetes mellitus without complication (HCC) 11/26/2013  ? Bilateral shoulder pain 10/31/2013  ? Erectile dysfunction 04/18/2013  ? Mixed hyperlipidemia 11/26/2009  ?  LIMB PAIN 07/23/2009  ? GERD 04/29/2009  ? Obesity 10/22/2008  ? OBSTRUCTIVE SLEEP APNEA 10/22/2008  ? Essential hypertension 10/22/2008  ? CARDIOMYOPATHY 10/22/2008  ? CONGESTIVE HEART FAILURE, MILD 10/22/2008  ? Chronic systolic heart failure (HCC) 10/16/2008  ? ? ?REFERRING DIAG: Z98.890 (ICD-10-CM) - Post-operative state Z96.659 (ICD-10-CM) - S/P TKR (total knee replacement)  ? ?ONSET DATE: 04/21/2021 ? ?THERAPY DIAG:  ?Difficulty in walking, not elsewhere classified ? ?Localized edema ? ?Stiffness of right knee, not elsewhere classified ? ?Right knee pain, unspecified chronicity ? ?Muscle weakness (generalized) ? ?PERTINENT HISTORY: Previous Lt hip fracture, cardiac history, DM and previous stroke. ? ?PRECAUTIONS: Knee ? ?SUBJECTIVE: Pt indicated a little in Rt knee today but noticing complaints in Lt knee today more.  ? ?PAIN:  ?Are you having pain? Yes ?NPRS scale: (rated as "a little") ?Pain location: Knee ?Pain orientation: Right ?PAIN TYPE: surgical ?Pain description : pressure within joint on Rt ?Aggravating factors: nothing specific reported ?Relieving factors: rest ? ?PATIENT GOALS Return to work, be able to walk normally and do stairs without pain. ?  ?  ?OBJECTIVE:  ?  ?  ?PATIENT SURVEYS:  ?07/16/2021 FOTO 69, Goal met) ?05/07/2021 FOTO 49 (Goal 68 in 14 visits) ?  ?COGNITION: ?           Overall cognitive status: Within functional limits for tasks assessed              ?          ?LE AROM/PROM: ?  ?A/PROM Right ?05/07/2021 Left ?05/07/2021 RLE ?05/13/21 R LE 05/27/2021 R LE 06/03/2021 R LE AROM 06/05/2021 R LE AROM 07/03/2021 Rt LE AROM ?4/3/ 2023 Rt assessed supine 07/16/2021  ?Hip flexion             ?Hip extension             ?Hip abduction             ?Hip adduction             ?Hip internal rotation             ?Hip external rotation             ?Knee flexion 74 122 PROM 96* ?Seated after man 102 AROM 94 AROM (edema limited) 96 degrees AROM 101 degrees AROM 102 degrees ?PROM: 105 degrees 107 degrees  ?Knee  extension -8 0 PROM -4* ?Supine after man -3 degrees AROM -2 degrees AROM -3 degrees AROM 0 degrees 0 degrees 0 degrees  ?Ankle dorsiflexion             ?Ankle plantarflexion             ?Ankle inversion             ?Ankle eversion             ? (Blank rows = not tested) ?  ?LE MMT: ?  ?MMT  Right ?05/07/2021 Left ?05/07/2021 Right ?07/20/2021 Left ?07/20/2021  ?Hip flexion        ?Hip extension        ?Hip abduction        ?Hip adduction        ?Hip internal rotation        ?Hip external rotation        ?Knee flexion        ?Knee extension     5/5 ?77.2, 73 lbs 5/5 ?79.4, 76 lbs  ?Ankle dorsiflexion        ?Ankle plantarflexion        ?Ankle inversion        ?Ankle eversion        ? (Blank rows = not tested) ? ?Girth: ?07/16/2021:  Lt/Rt thigh girth in cm assessed 15 cm proximal to the superior patellar pole Lt 58.5, Rt 58 ?  ?  ?TODAY'S TREATMENT: ?07/20/2021:  ?Therex: ?Recumbent bike Seat 7 Lvl 5 6 mins  ?Knee extension Double leg up, Rt leg lowering eccentrically 25 lbs 2 x 10 ? ?  ?  ?TherActivity: ? Fwd step up on Rt leg 8 inch x 15, 12 inch (8 inch block + 4 inch block)x 15 to simulate entry into/out of truck ? Lateral step down 6 inch step on Rt leg x 15  ? Functional squat 15 lb weight 2 x 15 ? ?Neuro Re-ed ? Vector reach light touch fwd/lateral/back x 15 each - WB on Rt leg ? ?Modalities:  Vaso Rt knee high 34* 10 minutes ?  ? ? ? ?07/16/2021: ?Therapeutic Exercise: ?Aerobic: Recumbent bike Seat 7 & 6 for 8 minutes (4 minutes each) Full range ? ?Prone: Quadriceps stretch 5X 20 seconds with belt over opposite shoulder ? ?Seated: Tailgate knee flexion 1 minute;  ? ?AAROM knee flexion (L pushes R into flexion) 5X 10 seconds ?   ?  Knee extension machine (up with B, 90-40 degrees, slow eccentrics down 1 leg only) 2 sets of 5 R and 1 set of 5 L 25# ?    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 ?Step-down off 8 inch step 2 sets of 10 B  with slow eccentrics ? ?Leg Press R leg only push quickly into extension and slow into flexion 15X slow eccentrics 100# ? ?Sit to stand slow eccentrics 10X ? ?Modalities:  Vaso R knee high 34* 10 minutes ? ? ?07/07/2021:  ?Therex: ?Scifit bike seat at 11 Level 3 for 8 minutes ?Knee extension Double leg up, Rt leg lowering eccentrically 15 lbs x 10, 20 lbs 2 x 10 ?Seated Rt knee flexion stretch c overpressure from Lt leg 15 sec x 5 ?  ?  ?TherActivity: ? Step up forward 6 inch c TKE blue band 2 x 10 Rt leg ? Lateral step down 4 inch on Rt leg x 10 (pain in knee noted) ? 18 inch table transfer slow lowering x 10 ? ?Manual: ? Seated Rt knee flexion c IR/distraction mobilization c movement c contralateral leg movement opposite. ER/IR tibia mobilizations g3 ? ? ?PATIENT EDUCATION:  ?07/03/2021: ?Education details: HEP review with edema control, flexion AROM and quadriceps strength as the primary emphasis.  Reviewed reassessment. ?Person educated: Patient ?Education method: Explanation, Demonstration, Tactile cues, Verbal cues, and Handouts ?Education comprehension: verbalized understanding, returned demonstration, verbal cues required, tactile cues required, and needs further education ?  ?  ?HOME EXERCISE PROGRAM: ? Access Code: [email or web address] ?URL: [email or web address] ?Date: 07/03/2021 ?Prepared by: Robert Lovell ? ?Exercises ?- Supine Quadricep Sets  - 3-5 x daily - 7 x weekly - 2-3 sets - 10 reps - 5 second hold ?- Seated Knee Flexion AAROM  - 3 x daily - 7 x weekly - 1 sets - 1 reps - 3 minutes hold ?- Supine Knee Extension Strengthening  - 1 x daily - 7 x weekly - 2 sets - 10 reps ?- Supine Active Straight Leg Raise  - 2 x daily - 7 x weekly - 2 sets - 10 reps ?- Supine Heel Slide  - 1 x daily - 7 x weekly - 2 sets - 10 reps ?- Prone Quadricep Stretch with Strap  - 2-3 x daily - 7 x weekly - 1 sets - 5 reps - 20 seconds hold ?- Sit to Stand with Armchair  - 1 x daily - 7 x weekly - 2-3 sets - 10 reps ?-  Lateral Step Down  - 1 x daily - 7 x weekly - 2-3 sets - 10 reps ? ?ASSESSMENT: ?  ?CLINICAL IMPRESSION:  ?Dynamometry strength testing showed equal compared bilaterally and 5/5 today.  Functional WB strength for

## 2021-07-22 ENCOUNTER — Ambulatory Visit (INDEPENDENT_AMBULATORY_CARE_PROVIDER_SITE_OTHER): Payer: BC Managed Care – PPO | Admitting: Rehabilitative and Restorative Service Providers"

## 2021-07-22 ENCOUNTER — Encounter: Payer: Self-pay | Admitting: Rehabilitative and Restorative Service Providers"

## 2021-07-22 DIAGNOSIS — M25661 Stiffness of right knee, not elsewhere classified: Secondary | ICD-10-CM | POA: Diagnosis not present

## 2021-07-22 DIAGNOSIS — R262 Difficulty in walking, not elsewhere classified: Secondary | ICD-10-CM

## 2021-07-22 DIAGNOSIS — R600 Localized edema: Secondary | ICD-10-CM | POA: Diagnosis not present

## 2021-07-22 DIAGNOSIS — M6281 Muscle weakness (generalized): Secondary | ICD-10-CM

## 2021-07-22 DIAGNOSIS — M25561 Pain in right knee: Secondary | ICD-10-CM

## 2021-07-22 NOTE — Therapy (Signed)
?OUTPATIENT PHYSICAL THERAPY TREATMENT ? ? ?Patient Name: Caleb Garcia ?MRN: [id number] ?DOB:[birth year], 56 y.o., male ?Today's Date: 07/22/2021 ? ?PCP: Dorothyann Peng, NP ?REFERRING PROVIDER: Meredith Pel, MD ? ?  ? PT End of Session - 07/22/21 1347   ? ? Visit Number 14   ? Number of Visits 18   ? Date for PT Re-Evaluation 07/31/21   ? Authorization - Number of Visits 30   ? Progress Note Due on Visit 18   ? PT Start Time 1344   ? PT Stop Time 2376   ? PT Time Calculation (min) 49 min   ? Activity Tolerance Patient tolerated treatment well   ? Behavior During Therapy [facility] for tasks assessed/performed   ? ?  ?  ? ?  ? ? ? ? ? ? ? ?Past Medical History:  ?Diagnosis Date  ? Arthritis   ? Cardiomyopathy   ? Non-ischemic  ? CHF (congestive heart failure) ([place])   ? Diabetes mellitus without complication ([place])   ? HTN (hypertension)   ? Hyperlipidemia   ? Hypokalemia   ? Obesity   ? OSA (obstructive sleep apnea)   ? Sleep apnea   ? had surgery- no cpap  ? Stroke [facility]) 10/2008  ? Systolic CHF ([place])   ? ?Past Surgical History:  ?Procedure Laterality Date  ? avulsion fracture of left hip    ? CARDIAC CATHETERIZATION  10/08/08  ? LEFT HEART CATH AND CORONARY ANGIOGRAPHY N/A 08/03/2016  ? Procedure: Left Heart Cath and Coronary Angiography;  Surgeon: Jolaine Artist, MD;  Location: [place] [facility];  Service: Cardiovascular;  Laterality: N/A;  ? TONSILLECTOMY    ? TOTAL KNEE ARTHROPLASTY Right 04/21/2021  ? Procedure: RIGHT TOTAL KNEE ARTHROPLASTY;  Surgeon: Meredith Pel, MD;  Location: [place];  Service: Orthopedics;  Laterality: Right;  ? UVULOPALATOPHARYNGOPLASTY    ? ?Patient Active Problem List  ? Diagnosis Date Noted  ? Arthritis of right knee   ? S/P total knee arthroplasty, right 04/21/2021  ? Chest discomfort 07/28/2016  ? Controlled type 2 diabetes mellitus without complication ([place]) 28/31/5176  ? Bilateral shoulder pain 10/31/2013  ? Erectile dysfunction 04/18/2013  ? Mixed hyperlipidemia 11/26/2009  ?  LIMB PAIN 07/23/2009  ? GERD 04/29/2009  ? Obesity 10/22/2008  ? OBSTRUCTIVE SLEEP APNEA 10/22/2008  ? Essential hypertension 10/22/2008  ? CARDIOMYOPATHY 10/22/2008  ? CONGESTIVE HEART FAILURE, MILD 10/22/2008  ? Chronic systolic heart failure ([place]) 10/16/2008  ? ? ?REFERRING DIAG: Z98.890 (ICD-10-CM) - Post-operative state Z96.659 (ICD-10-CM) - S/P TKR (total knee replacement)  ? ?ONSET DATE: 04/21/2021 ? ?THERAPY DIAG:  ?Difficulty in walking, not elsewhere classified ? ?Muscle weakness (generalized) ? ?Localized edema ? ?Stiffness of right knee, not elsewhere classified ? ?Right knee pain, unspecified chronicity ? ?PERTINENT HISTORY: Previous Lt hip fracture, cardiac history, DM and previous stroke. ? ?PRECAUTIONS: Knee ? ?SUBJECTIVE: Page notes B knee pain.  He feels like he still benefits from the gabapentin and meloxicam. ? ?PAIN:  ?Are you having pain? Yes ?NPRS scale: 2-5/10 ?Pain location: Knee ?Pain orientation: Right ?PAIN TYPE: surgical ?Pain description : pressure within joint on Rt ?Aggravating factors: nothing specific reported ?Relieving factors: rest ? ?PATIENT GOALS Return to work, be able to walk normally and do stairs without pain. ?  ?  ?OBJECTIVE:  ?  ?  ?PATIENT SURVEYS:  ?07/16/2021 FOTO 69, Goal met) ?05/07/2021 FOTO 49 (Goal 68 in 14 visits) ?  ?COGNITION: ?  Overall cognitive status: Within functional limits for tasks assessed              ?          ?LE AROM/PROM: ?  ?A/PROM Right ?05/07/2021 Left ?05/07/2021 RLE ?05/13/21 R LE 05/27/2021 R LE 06/03/2021 R LE AROM 06/05/2021 R LE AROM 07/03/2021 Rt LE AROM ?4/3/ 2023 Rt assessed supine 07/16/2021  ?Hip flexion             ?Hip extension             ?Hip abduction             ?Hip adduction             ?Hip internal rotation             ?Hip external rotation             ?Knee flexion 74 122 PROM 96* ?Seated after man 102 AROM 94 AROM (edema limited) 96 degrees AROM 101 degrees AROM 102 degrees ?PROM: 105 degrees 107 degrees  ?Knee extension -8  0 PROM -4* ?Supine after man -3 degrees AROM -2 degrees AROM -3 degrees AROM 0 degrees 0 degrees 0 degrees  ?Ankle dorsiflexion             ?Ankle plantarflexion             ?Ankle inversion             ?Ankle eversion             ? (Blank rows = not tested) ?  ?LE MMT: ?  ?MMT  Right ?05/07/2021 Left ?05/07/2021 Right ?07/20/2021 Left ?07/20/2021  ?Hip flexion        ?Hip extension        ?Hip abduction        ?Hip adduction        ?Hip internal rotation        ?Hip external rotation        ?Knee flexion        ?Knee extension     5/5 ?77.2, 73 lbs 5/5 ?79.4, 76 lbs  ?Ankle dorsiflexion        ?Ankle plantarflexion        ?Ankle inversion        ?Ankle eversion        ? (Blank rows = not tested) ? ?Girth: ?07/16/2021:  Lt/Rt thigh girth in cm assessed 15 cm proximal to the superior patellar pole Lt 58.5, Rt 58 ?  ?  ?TODAY'S TREATMENT: ?07/22/2021: ?Aerobic: Recumbent bike Seat 7 for 5 minutes Full range ? ?Prone: Quadriceps stretch 5X 20 seconds with belt over opposite shoulder ? ?Seated: Tailgate knee flexion 1 minute;  ? ?AAROM knee flexion (L pushes R into flexion) 5X 10 seconds ?   ?Knee extension machine (up with B, 90-40 degrees, slow eccentrics down 1 leg only) 2 sets of 5 R and L 35# ?   ? ?Neuromuscular re-education: Single-leg stance on foam 5X 10 seconds B ?  ? 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 ?Step-down off 8 inch step 2 sets of 10 B with slow eccentrics ? ?Leg Press R or L leg only push quickly into extension and slow into flexion 15X slow eccentrics 100# ? ?Sit to stand slow eccentrics 10X ? ?Modalities:  Vaso R knee high 34* 10 minutes ? ? ?07/20/2021:  ?Therex: ?Recumbent bike Seat 7 Lvl 5 6 mins  ?Knee extension Double leg up, Rt leg lowering eccentrically 25 lbs 2 x 10 ? ?  ?  ?TherActivity: ? Fwd step up on Rt leg 8 inch x 15, 12 inch (8 inch  block + 4 inch block)x 15 to simulate entry into/out of truck ? Lateral step down 6 inch step on Rt leg x 15  ? Functional squat 15 lb weight 2 x 15 ? ?Neuro Re-ed ? Vector reach light touch fwd/lateral/back x 15 each - WB on Rt leg ? ?Modalities:  Vaso Rt knee high 34* 10 minutes ?  ? ? ? ?07/16/2021: ?Therapeutic Exercise: ?Aerobic: Recumbent bike Seat 7 & 6 for 8 minutes (4 minutes each) Full range ? ?Prone: Quadriceps stretch 5X 20 seconds with belt over opposite shoulder ? ?Seated: Tailgate knee flexion 1 minute;  ? ?AAROM knee flexion (L pushes R into flexion) 5X 10 seconds ?   ?Knee extension machine (up with B, 90-40 degrees, slow eccentrics down 1 leg only) 2 sets of 5 R and 1 set of 5 L 25# ?    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 ?Step-down off 8 inch step 2 sets of 10 B with slow eccentrics ? ?Leg Press R leg only push quickly into extension and slow into flexion 15X slow eccentrics 100# ? ?Sit to stand slow eccentrics 10X ? ?Modalities:  Vaso R knee high 34* 10 minutes ? ? ? ?PATIENT EDUCATION:  ?07/03/2021: ?Education details: HEP review with edema control, flexion AROM and quadriceps strength as the primary emphasis.  Reviewed reassessment. ?Person educated: Patient ?Education method: Explanation, Demonstration, Tactile cues, Verbal cues, and Handouts ?Education comprehension: verbalized understanding, returned demonstration, verbal cues required, tactile cues required, and needs further education ?  ?  ?HOME EXERCISE PROGRAM: ? Access Code: [email or web address] ?URL: [email or web address] ?Date: 07/03/2021 ?Prepared by: Vista Mink ? ?Exercises ?- Supine Quadricep Sets  - 3-5 x daily - 7 x weekly - 2-3 sets - 10 reps - 5 second hold ?- Seated Knee Flexion AAROM  - 3 x daily - 7 x weekly - 1 sets - 1 reps - 3 minutes hold ?- Supine Knee Extension Strengthening  - 1 x daily - 7 x weekly - 2 sets - 10 reps ?-  Supine Active Straight Leg Raise  - 2 x daily - 7 x weekly - 2 sets - 10 reps ?- Supine Heel Slide  - 1 x daily - 7 x weekly - 2 sets - 10 reps ?- Prone Quadricep Stretch with Strap  - 2-3 x daily - 7 x we

## 2021-07-29 ENCOUNTER — Ambulatory Visit (INDEPENDENT_AMBULATORY_CARE_PROVIDER_SITE_OTHER): Payer: BC Managed Care – PPO | Admitting: Rehabilitative and Restorative Service Providers"

## 2021-07-29 ENCOUNTER — Encounter: Payer: Self-pay | Admitting: Rehabilitative and Restorative Service Providers"

## 2021-07-29 DIAGNOSIS — M25661 Stiffness of right knee, not elsewhere classified: Secondary | ICD-10-CM | POA: Diagnosis not present

## 2021-07-29 DIAGNOSIS — R262 Difficulty in walking, not elsewhere classified: Secondary | ICD-10-CM

## 2021-07-29 DIAGNOSIS — R600 Localized edema: Secondary | ICD-10-CM | POA: Diagnosis not present

## 2021-07-29 DIAGNOSIS — M25561 Pain in right knee: Secondary | ICD-10-CM

## 2021-07-29 DIAGNOSIS — M6281 Muscle weakness (generalized): Secondary | ICD-10-CM | POA: Diagnosis not present

## 2021-07-29 NOTE — Therapy (Signed)
?OUTPATIENT PHYSICAL THERAPY TREATMENT ? ? ?Patient Name: Caleb Garcia ?MRN: [id number] ?DOB:[birth year], 56 y.o., male ?Today's Date: 07/29/2021 ? ?PCP: Caleb Peng, NP ?REFERRING PROVIDER: Meredith Pel, MD ? ?  ? PT End of Session - 07/29/21 1305   ? ? Visit Number 15   ? Number of Visits 18   ? Date for PT Re-Evaluation 07/31/21   ? Authorization - Number of Visits 30   ? Progress Note Due on Visit 18   ? PT Start Time 1300   ? PT Stop Time 1355   ? PT Time Calculation (min) 55 min   ? Activity Tolerance Patient tolerated treatment well   ? Behavior During Therapy [facility] for tasks assessed/performed   ? ?  ?  ? ?  ? ? ? ? ? ? ? ? ?Past Medical History:  ?Diagnosis Date  ? Arthritis   ? Cardiomyopathy   ? Non-ischemic  ? CHF (congestive heart failure) ([place])   ? Diabetes mellitus without complication ([place])   ? HTN (hypertension)   ? Hyperlipidemia   ? Hypokalemia   ? Obesity   ? OSA (obstructive sleep apnea)   ? Sleep apnea   ? had surgery- no cpap  ? Stroke [facility]) 10/2008  ? Systolic CHF ([place])   ? ?Past Surgical History:  ?Procedure Laterality Date  ? avulsion fracture of left hip    ? CARDIAC CATHETERIZATION  10/08/08  ? LEFT HEART CATH AND CORONARY ANGIOGRAPHY N/A 08/03/2016  ? Procedure: Left Heart Cath and Coronary Angiography;  Surgeon: Jolaine Artist, MD;  Location: [place] [facility];  Service: Cardiovascular;  Laterality: N/A;  ? TONSILLECTOMY    ? TOTAL KNEE ARTHROPLASTY Right 04/21/2021  ? Procedure: RIGHT TOTAL KNEE ARTHROPLASTY;  Surgeon: Caleb Pel, MD;  Location: [place];  Service: Orthopedics;  Laterality: Right;  ? UVULOPALATOPHARYNGOPLASTY    ? ?Patient Active Problem List  ? Diagnosis Date Noted  ? Arthritis of right knee   ? S/P total knee arthroplasty, right 04/21/2021  ? Chest discomfort 07/28/2016  ? Controlled type 2 diabetes mellitus without complication ([place]) 71/24/5809  ? Bilateral shoulder pain 10/31/2013  ? Erectile dysfunction 04/18/2013  ? Mixed hyperlipidemia 11/26/2009   ? LIMB PAIN 07/23/2009  ? GERD 04/29/2009  ? Obesity 10/22/2008  ? OBSTRUCTIVE SLEEP APNEA 10/22/2008  ? Essential hypertension 10/22/2008  ? CARDIOMYOPATHY 10/22/2008  ? CONGESTIVE HEART FAILURE, MILD 10/22/2008  ? Chronic systolic heart failure ([place]) 10/16/2008  ? ? ?REFERRING DIAG: Z98.890 (ICD-10-CM) - Post-operative state Z96.659 (ICD-10-CM) - S/P TKR (total knee replacement)  ? ?ONSET DATE: 04/21/2021 ? ?THERAPY DIAG:  ?Difficulty in walking, not elsewhere classified ? ?Muscle weakness (generalized) ? ?Localized edema ? ?Stiffness of right knee, not elsewhere classified ? ?Right knee pain, unspecified chronicity ? ?PERTINENT HISTORY: Previous Lt hip fracture, cardiac history, DM and previous stroke. ? ?PRECAUTIONS: Knee ? ?SUBJECTIVE: Caleb Garcia notes R > L knee pain.  He feels like he still benefits from the gabapentin and meloxicam.  He has been "taking it easy" since his last visit and notes his R knee and hip are particularly achy today. ? ?PAIN:  ?Are you having pain? Yes ?NPRS scale: 2-5/10 ?Pain location: Knee ?Pain orientation: Right ?PAIN TYPE: surgical ?Pain description : pressure within joint on Rt ?Aggravating factors: nothing specific reported ?Relieving factors: rest ? ?PATIENT GOALS Return to work, be able to walk normally and do stairs without pain. ?  ?  ?OBJECTIVE:  ?  ?  ?PATIENT  SURVEYS:  ?07/16/2021 FOTO 69, Goal met) ?05/07/2021 FOTO 49 (Goal 68 in 14 visits) ?  ?COGNITION: ?         Overall cognitive status: Within functional limits for tasks assessed              ?          ?LE AROM/PROM: ?  ?A/PROM Right ?05/07/2021 Left ?05/07/2021 RLE ?05/13/21 R LE 05/27/2021 R LE 06/03/2021 R LE AROM 06/05/2021 R LE AROM 07/03/2021 Rt LE AROM ?4/3/ 2023 Rt assessed supine 07/16/2021 R knee/hip ? AROM assessed supine 07/29/2021  ?Hip flexion            90 degrees  ?Hamstrings length            60 degrees  ?Hip abduction              ?Hip adduction              ?Hip internal rotation            11 degrees  ?Hip external  rotation            24 degrees  ?Knee flexion 74 122 PROM 96* ?Seated after man 102 AROM 94 AROM (edema limited) 96 degrees AROM 101 degrees AROM 102 degrees ?PROM: 105 degrees 107 degrees 107 degrees  ?Knee extension -8 0 PROM -4* ?Supine after man -3 degrees AROM -2 degrees AROM -3 degrees AROM 0 degrees 0 degrees 0 degrees 0 degrees  ?Ankle dorsiflexion              ?Ankle plantarflexion              ?Ankle inversion              ?Ankle eversion              ? (Blank rows = not tested) ?  ?LE MMT: ?  ?MMT  Right ?05/07/2021 Left ?05/07/2021 Right ?07/20/2021 Left ?07/20/2021  ?Hip flexion        ?Hip extension        ?Hip abduction        ?Hip adduction        ?Hip internal rotation        ?Hip external rotation        ?Knee flexion        ?Knee extension     5/5 ?77.2, 73 lbs 5/5 ?79.4, 76 lbs  ?Ankle dorsiflexion        ?Ankle plantarflexion        ?Ankle inversion        ?Ankle eversion        ? (Blank rows = not tested) ? ?Girth: ?07/16/2021:  Lt/Rt thigh girth in cm assessed 15 cm proximal to the superior patellar pole Lt 58.5, Rt 58 ?  ?  ?TODAY'S TREATMENT: ?07/29/2021 ?Aerobic: Recumbent bike Seat 7 for 8 minutes Full range ? ?Prone: Quadriceps stretch 5X 20 seconds with belt over opposite shoulder ? ?Seated: [place] knee flexion 1 minute;  ? ?AAROM knee flexion (L pushes R into flexion) 5X 10 seconds ?   ?Knee extension machine (up with B, 90-40 degrees, slow eccentrics down 1 leg only) 2 sets of 5 R and L 35# ?   ?Supine hip flexors stretch (R leg hangs off table) 4X 20 seconds ? ?  Figure 4 stretch 4X 20 seconds ? ? ?Neuromuscular re-education: Single-leg stance on foam 5X 20 seconds B ?  ? ?Functional Activities:  ?Step-down  off 8 inch step 2 sets of 10 B with slow eccentrics ? ?Leg Press R or L leg only push quickly into extension and slow into flexion 15X slow eccentrics 100# ? ?(HEP, not done in office today) Sit to stand slow eccentrics 10X ? ?Modalities:  Vaso R knee high 34* 10  minutes ? ? ?07/22/2021: ?Aerobic: Recumbent bike Seat 7 for 5 minutes Full range ? ?Prone: Quadriceps stretch 5X 20 seconds with belt over opposite shoulder ? ?Seated: [place] knee flexion 1 minute;  ? ?AAROM knee flexion (L pushes R into flexion) 5X 10 seconds ?   ?Knee extension machine (up with B, 90-40 degrees, slow eccentrics down 1 leg only) 2 sets of 5 R and L 35# ?   ? ?Neuromuscular re-education: Single-leg stance on foam 5X 10 seconds B ?  ? 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 ?Step-down off 8 inch step 2 sets of 10 B with slow eccentrics ? ?Leg Press R or L leg only push quickly into extension and slow into flexion 15X slow eccentrics 100# ? ?Sit to stand slow eccentrics 10X ? ?Modalities:  Vaso R knee high 34* 10 minutes ? ? ?07/20/2021:  ?Therex: ?Recumbent bike Seat 7 Lvl 5 6 mins  ?Knee extension Double leg up, Rt leg lowering eccentrically 25 lbs 2 x 10 ? ?  ?  ?TherActivity: ? Fwd step up on Rt leg 8 inch x 15, 12 inch (8 inch block + 4 inch block)x 15 to simulate entry into/out of truck ? Lateral step down 6 inch step on Rt leg x 15  ? Functional squat 15 lb weight 2 x 15 ? ?Neuro Re-ed ? Vector reach light touch fwd/lateral/back x 15 each - WB on Rt leg ? ?Modalities:  Vaso Rt knee high 34* 10 minutes ?  ? ? ? ? ?PATIENT EDUCATION:  ?07/03/2021: ?Education details: HEP review with edema control, flexion AROM and quadriceps strength as the primary emphasis.  Reviewed reassessment. ?Person educated: Patient ?Education method: Explanation, Demonstration, Tactile cues, Verbal cues, and Handouts ?Education comprehension: verbalized understanding, returned demonstration, verbal cues required, tactile cues required, and needs further education ?  ?  ?HOME EXERCISE PROGRAM: ?Access Code: [email or web address] ?URL: [email or web address] ?Date:  07/29/2021 ?Prepared by: Vista Mink ? ?Exercises ?- Supine Quadricep Sets  - 3-5 x daily - 7 x weekly - 2-3 sets - 10 reps - 5 second hold ?- Seated Knee Flexion AAROM  - 3 x daily - 7 x weekly - 1 sets - 1 reps - 3 minutes ho

## 2021-07-31 ENCOUNTER — Encounter: Payer: BC Managed Care – PPO | Admitting: Rehabilitative and Restorative Service Providers"

## 2021-08-05 ENCOUNTER — Encounter: Payer: Self-pay | Admitting: Orthopedic Surgery

## 2021-08-05 ENCOUNTER — Ambulatory Visit (INDEPENDENT_AMBULATORY_CARE_PROVIDER_SITE_OTHER): Payer: BC Managed Care – PPO | Admitting: Surgical

## 2021-08-05 DIAGNOSIS — Z96651 Presence of right artificial knee joint: Secondary | ICD-10-CM

## 2021-08-05 NOTE — Progress Notes (Signed)
? ?Post-Op Visit Note ?  ?Patient: Caleb Garcia           ?Date of Birth: 28-May-1965           ?MRN: [id number] ?Visit Date: 08/05/2021 ?PCP: Dorothyann Peng, NP ? ? ?Assessment & Plan: ? ?Chief Complaint:  ?Chief Complaint  ?Patient presents with  ? Right Knee - Routine Post Op  ?  04/21/21 (56m 16d) Right Total Knee Arthroplasty  ?  ? ?Visit Diagnoses:  ?1. S/P total knee replacement, right   ? ? ?Plan: Patient is a 56 year old male who presents s/p right total knee arthroplasty on 04/21/2021.  States that his knee is doing better and he feels better than he did at his last appointment.  He finished physical therapy again on 07/31/2021.  He was measuring 107 degrees in PT according to him.  He has been compliant with doing a home exercise program daily since finishing PT.  Taking Mobic and gabapentin daily for pain control.  Still has some difficulty with stairs.  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  On exam, 0 degrees extension and 100 degrees of knee flexion.  Incision looks to be healed well.  No effusion noted.  No calf tenderness.  Negative [place]' sign.  Excellent quad strength rated 5/5.  He walks without any significant antalgia. ? ?He still struggling with some stairs and this would make his job a little difficult so plan to continue with physical therapy for 4 more weeks and then return to work on 09/07/2021.  He will then follow-up in 2 months in the first week of July to see how return to work has gone and if there is been any issues.  Getting up and down stairs and getting up and down off of the truck may actually help his knee range of motion. ? ?Follow-Up Instructions: Return in about 2 months (around 10/05/2021).  ? ?Orders:  ?Orders Placed This Encounter  ?Procedures  ? Ambulatory referral to Physical Therapy  ? ?No orders of the defined types were placed in this encounter. ? ? ?Imaging: ?No results  found. ? ?PMFS History: ?Patient Active Problem List  ? Diagnosis Date Noted  ? Arthritis of right knee   ? S/P total knee arthroplasty, right 04/21/2021  ? Chest discomfort 07/28/2016  ? Controlled type 2 diabetes mellitus without complication ([place]) 0000000  ? Bilateral shoulder pain 10/31/2013  ? Erectile dysfunction 04/18/2013  ? Mixed hyperlipidemia 11/26/2009  ? LIMB PAIN 07/23/2009  ? GERD 04/29/2009  ? Obesity 10/22/2008  ? OBSTRUCTIVE SLEEP APNEA 10/22/2008  ? Essential hypertension 10/22/2008  ? CARDIOMYOPATHY 10/22/2008  ? CONGESTIVE HEART FAILURE, MILD 10/22/2008  ? Chronic systolic heart failure ([place]) 10/16/2008  ? ?Past Medical History:  ?Diagnosis Date  ? Arthritis   ? Cardiomyopathy   ? Non-ischemic  ? CHF (congestive heart failure) ([place])   ? Diabetes mellitus without complication ([place])   ? HTN (hypertension)   ? Hyperlipidemia   ? Hypokalemia   ? Obesity   ? OSA (obstructive sleep apnea)   ? Sleep apnea   ? had surgery- no cpap  ? Stroke [facility]) 10/2008  ? Systolic CHF ([place])   ?  ?Family History  ?Problem Relation Age of Onset  ? Diabetes Mother   ? Hypertension Mother   ? Heart disease Mother   ? Cancer Father   ?  Throat cancer  ? Stroke Father   ? Stroke Brother 45  ? Prostate cancer Neg Hx   ? Colon cancer Neg Hx   ? Colon polyps Neg Hx   ? Esophageal cancer Neg Hx   ? Rectal cancer Neg Hx   ? Stomach cancer Neg Hx   ?  ?Past Surgical History:  ?Procedure Laterality Date  ? avulsion fracture of left hip    ? CARDIAC CATHETERIZATION  10/08/08  ? LEFT HEART CATH AND CORONARY ANGIOGRAPHY N/A 08/03/2016  ? Procedure: Left Heart Cath and Coronary Angiography;  Surgeon: Jolaine Artist, MD;  Location: [place] [facility];  Service: Cardiovascular;  Laterality: N/A;  ? TONSILLECTOMY    ? TOTAL KNEE ARTHROPLASTY Right 04/21/2021  ? Procedure: RIGHT TOTAL KNEE ARTHROPLASTY;  Surgeon: Meredith Pel, MD;  Location: [place];  Service: Orthopedics;  Laterality: Right;  ? UVULOPALATOPHARYNGOPLASTY     ? ?Social History  ? ?Occupational History  ?  Employer: UNEMPLOYED  ?Tobacco Use  ? Smoking status: Some Days  ?  Types: Cigars  ? Smokeless tobacco: Never  ?Vaping Use  ? Vaping Use: Never used  ?Substance and Sexual Activity  ? Alcohol use: No  ? Drug use: No  ? Sexual activity: Not on file  ? ? ? ?

## 2021-08-17 ENCOUNTER — Encounter: Payer: Self-pay | Admitting: Rehabilitative and Restorative Service Providers"

## 2021-08-17 ENCOUNTER — Ambulatory Visit (INDEPENDENT_AMBULATORY_CARE_PROVIDER_SITE_OTHER): Payer: BC Managed Care – PPO | Admitting: Rehabilitative and Restorative Service Providers"

## 2021-08-17 DIAGNOSIS — M25661 Stiffness of right knee, not elsewhere classified: Secondary | ICD-10-CM

## 2021-08-17 DIAGNOSIS — R262 Difficulty in walking, not elsewhere classified: Secondary | ICD-10-CM | POA: Diagnosis not present

## 2021-08-17 DIAGNOSIS — M25561 Pain in right knee: Secondary | ICD-10-CM

## 2021-08-17 DIAGNOSIS — R600 Localized edema: Secondary | ICD-10-CM | POA: Diagnosis not present

## 2021-08-17 DIAGNOSIS — M6281 Muscle weakness (generalized): Secondary | ICD-10-CM

## 2021-08-17 NOTE — Therapy (Signed)
?OUTPATIENT PHYSICAL THERAPY TREATMENT ? ? ?Patient Name: Caleb Garcia ?MRN: [id number] ?DOB:[birth year], 56 y.o., male ?Today's Date: 08/17/2021 ? ?PCP: Caleb Peng, NP ?REFERRING PROVIDER: Meredith Pel, MD ? ?  ? PT End of Session - 08/17/21 1159   ? ? Visit Number 16   ? Number of Visits 18   ? Date for PT Re-Evaluation 07/31/21   ? Authorization - Number of Visits 30   ? Progress Note Due on Visit 18   ? PT Start Time 1151   ? PT Stop Time 1230   ? PT Time Calculation (min) 39 min   ? Activity Tolerance Patient tolerated treatment well;No increased pain   ? Behavior During Therapy [facility] for tasks assessed/performed   ? ?  ?  ? ?  ? ? ? ? ? ? ? ? ? ?Past Medical History:  ?Diagnosis Date  ? Arthritis   ? Cardiomyopathy   ? Non-ischemic  ? CHF (congestive heart failure) ([place])   ? Diabetes mellitus without complication ([place])   ? HTN (hypertension)   ? Hyperlipidemia   ? Hypokalemia   ? Obesity   ? OSA (obstructive sleep apnea)   ? Sleep apnea   ? had surgery- no cpap  ? Stroke [facility]) 10/2008  ? Systolic CHF ([place])   ? ?Past Surgical History:  ?Procedure Laterality Date  ? avulsion fracture of left hip    ? CARDIAC CATHETERIZATION  10/08/08  ? LEFT HEART CATH AND CORONARY ANGIOGRAPHY N/A 08/03/2016  ? Procedure: Left Heart Cath and Coronary Angiography;  Surgeon: Caleb Artist, MD;  Location: [place] [facility];  Service: Cardiovascular;  Laterality: N/A;  ? TONSILLECTOMY    ? TOTAL KNEE ARTHROPLASTY Right 04/21/2021  ? Procedure: RIGHT TOTAL KNEE ARTHROPLASTY;  Surgeon: Caleb Pel, MD;  Location: [place];  Service: Orthopedics;  Laterality: Right;  ? UVULOPALATOPHARYNGOPLASTY    ? ?Patient Active Problem List  ? Diagnosis Date Noted  ? Arthritis of right knee   ? S/P total knee arthroplasty, right 04/21/2021  ? Chest discomfort 07/28/2016  ? Controlled type 2 diabetes mellitus without complication ([place]) 58/85/0277  ? Bilateral shoulder pain 10/31/2013  ? Erectile dysfunction 04/18/2013  ? Mixed  hyperlipidemia 11/26/2009  ? LIMB PAIN 07/23/2009  ? GERD 04/29/2009  ? Obesity 10/22/2008  ? OBSTRUCTIVE SLEEP APNEA 10/22/2008  ? Essential hypertension 10/22/2008  ? CARDIOMYOPATHY 10/22/2008  ? CONGESTIVE HEART FAILURE, MILD 10/22/2008  ? Chronic systolic heart failure ([place]) 10/16/2008  ? ? ?REFERRING DIAG: Z98.890 (ICD-10-CM) - Post-operative state Z96.659 (ICD-10-CM) - S/P TKR (total knee replacement)  ? ?ONSET DATE: 04/21/2021 ? ?THERAPY DIAG:  ?Difficulty in walking, not elsewhere classified ? ?Muscle weakness (generalized) ? ?Localized edema ? ?Stiffness of right knee, not elsewhere classified ? ?Right knee pain, unspecified chronicity ? ?PERTINENT HISTORY: Previous Lt hip fracture, cardiac history, DM and previous stroke. ? ?PRECAUTIONS: Knee ? ?SUBJECTIVE: Caleb Garcia notes he will be returning to work June 5.  Caleb Garcia notes R > L knee pain is about the same as his last visit.  He feels like he still benefits from the gabapentin and meloxicam.  He has been "inconsistent" with his HEP since his last visit. ? ?PAIN:  ?Are you having pain? Yes ?NPRS scale: 2-5/10 ?Pain location: Knee ?Pain orientation: Right ?PAIN TYPE: surgical ?Pain description : pressure within joint on Rt ?Aggravating factors: nothing specific reported ?Relieving factors: rest ? ?PATIENT GOALS Return to work, be able to walk normally and do stairs without  pain. ?  ?  ?OBJECTIVE:  ?  ?  ?PATIENT SURVEYS:  ?08/17/2021 FOTO 63 (Goal 68) ?07/16/2021 FOTO 69, Goal met) ?05/07/2021 FOTO 49 (Goal 68 in 14 visits) ?  ?COGNITION: ?         Overall cognitive status: Within functional limits for tasks assessed              ?          ?LE AROM/PROM: ?  ?A/PROM Right ?05/07/2021 Left ?05/07/2021 RLE ?05/13/21 R LE 05/27/2021 R LE 06/03/2021 R LE AROM 06/05/2021 R LE AROM 07/03/2021 Rt LE AROM ?4/3/ 2023 Rt assessed supine 07/16/2021 R knee/hip ? AROM assessed supine 07/29/2021 R knee/hip ? AROM assessed supine 08/17/2021  ?Hip flexion            90 degrees   ?Hamstrings  length            60 degrees   ?Hip abduction               ?Hip adduction               ?Hip internal rotation            11 degrees   ?Hip external rotation            24 degrees   ?Knee flexion 74 122 PROM 96* ?Seated after man 102 AROM 94 AROM (edema limited) 96 degrees AROM 101 degrees AROM 102 degrees ?PROM: 105 degrees 107 degrees 107 degrees 109 degrees  ?Knee extension -8 0 PROM -4* ?Supine after man -3 degrees AROM -2 degrees AROM -3 degrees AROM 0 degrees 0 degrees 0 degrees 0 degrees 0 degrees  ?Ankle dorsiflexion               ?Ankle plantarflexion               ?Ankle inversion               ?Ankle eversion               ? (Blank rows = not tested) ?  ?LE MMT: ?  ?MMT  Right ?05/07/2021 Left ?05/07/2021 Right ?07/20/2021 Left ?07/20/2021  ?Hip flexion        ?Hip extension        ?Hip abduction        ?Hip adduction        ?Hip internal rotation        ?Hip external rotation        ?Knee flexion        ?Knee extension     5/5 ?77.2, 73 lbs 5/5 ?79.4, 76 lbs  ?Ankle dorsiflexion        ?Ankle plantarflexion        ?Ankle inversion        ?Ankle eversion        ? (Blank rows = not tested) ? ?Girth: ?07/16/2021:  Lt/Rt thigh girth in cm assessed 15 cm proximal to the superior patellar pole Lt 58.5, Rt 58 ?  ?  ?TODAY'S TREATMENT: ?08/17/2021 ?Aerobic: Recumbent bike Seat 7 for 8 minutes Full range Level 5 ? ?Prone: Quadriceps stretch 5X 20 seconds with belt over opposite shoulder ? ?Seated: [place] knee flexion 1 minute;  ? ?AAROM knee flexion (L pushes R into flexion) 5X 10 seconds ?   ?Seated straight leg raises 3 sets of 5 for 3 seconds with slow eccentrics 3.5# ? ? ?Neuromuscular re-education: Single-leg stance on foam 6X 10  seconds B ?  ? ?Functional Activities:  ?Step-down off 16 & 13 inch step 10 each B with slow eccentrics ? ?Leg Press R or L leg only push quickly into extension and slow into flexion 10X slow eccentrics 100# ? ?Sit to stand slow eccentrics 10X ? ? ?07/29/2021 ?Aerobic: Recumbent bike Seat  7 for 8 minutes Full range ? ?Prone: Quadriceps stretch 5X 20 seconds with belt over opposite shoulder ? ?Seated: [place] knee flexion 1 minute;  ? ?AAROM knee flexion (L pushes R into flexion) 5X 10 seconds ?   ?Knee extension machine (up with B, 90-40 degrees, slow eccentrics down 1 leg only) 2 sets of 5 R and L 35# ?   ?Supine hip flexors stretch (R leg hangs off table) 4X 20 seconds ? ?  Figure 4 stretch 4X 20 seconds ? ? ?Neuromuscular re-education: Single-leg stance on foam 5X 20 seconds B ?  ? ?Functional Activities:  ?Step-down off 8 inch step 2 sets of 10 B with slow eccentrics ? ?Leg Press R or L leg only push quickly into extension and slow into flexion 15X slow eccentrics 100# ? ?(HEP, not done in office today) Sit to stand slow eccentrics 10X ? ?Modalities:  Vaso R knee high 34* 10 minutes ? ? ?07/22/2021: ?Aerobic: Recumbent bike Seat 7 for 5 minutes Full range ? ?Prone: Quadriceps stretch 5X 20 seconds with belt over opposite shoulder ? ?Seated: [place] knee flexion 1 minute;  ? ?AAROM knee flexion (L pushes R into flexion) 5X 10 seconds ?   ?Knee extension machine (up with B, 90-40 degrees, slow eccentrics down 1 leg only) 2 sets of 5 R and L 35# ?   ? ?Neuromuscular re-education: Single-leg stance on foam 5X 10 seconds B ?  ? 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 ?Step-down off 8 inch step 2 sets of 10 B with slow eccentrics ? ?Leg Press R or L leg only push quickly into extension and slow into flexion 15X slow eccentrics 100# ? ?Sit to stand slow eccentrics 10X ? ?Modalities:  Vaso R knee high 34* 10 minutes ? ? ? ?PATIENT EDUCATION:  ?07/03/2021: ?Education details: HEP review with edema control, flexion AROM and quadriceps strength as the primary emphasis.  Reviewed reassessment. ?Person educated: Patient ?Education method: Explanation,  Demonstration, Tactile cues, Verbal cues, and Handouts ?Education comprehension: verbalized understanding, returned demonstration, verbal cues required, tactile cues required, and needs further education ?  ?  ?HOM

## 2021-08-19 ENCOUNTER — Encounter: Payer: Self-pay | Admitting: Rehabilitative and Restorative Service Providers"

## 2021-08-19 ENCOUNTER — Ambulatory Visit (INDEPENDENT_AMBULATORY_CARE_PROVIDER_SITE_OTHER): Payer: BC Managed Care – PPO | Admitting: Rehabilitative and Restorative Service Providers"

## 2021-08-19 DIAGNOSIS — R600 Localized edema: Secondary | ICD-10-CM | POA: Diagnosis not present

## 2021-08-19 DIAGNOSIS — M25561 Pain in right knee: Secondary | ICD-10-CM

## 2021-08-19 DIAGNOSIS — M6281 Muscle weakness (generalized): Secondary | ICD-10-CM | POA: Diagnosis not present

## 2021-08-19 DIAGNOSIS — R262 Difficulty in walking, not elsewhere classified: Secondary | ICD-10-CM

## 2021-08-19 DIAGNOSIS — M25661 Stiffness of right knee, not elsewhere classified: Secondary | ICD-10-CM

## 2021-08-19 NOTE — Therapy (Signed)
?OUTPATIENT PHYSICAL THERAPY TREATMENT ? ? ?Patient Name: LOYS Garcia ?MRN: [id number] ?DOB:02-Nov-1965, 56 y.o., male ?Today's Date: 08/19/2021 ? ?PCP: Dorothyann Peng, NP ?REFERRING PROVIDER: Meredith Pel, MD ? ?  ? PT End of Session - 08/19/21 1207   ? ? Visit Number 17   ? Number of Visits 18   ? Date for PT Re-Evaluation 07/31/21   ? Authorization - Number of Visits 30   ? Progress Note Due on Visit 18   ? PT Start Time 1148   ? PT Stop Time 1237   ? PT Time Calculation (min) 49 min   ? Activity Tolerance Patient tolerated treatment well;No increased pain   ? Behavior During Therapy [facility] for tasks assessed/performed   ? ?  ?  ? ?  ? ? ? ? ? ? ? ? ? ? ?Past Medical History:  ?Diagnosis Date  ? Arthritis   ? Cardiomyopathy   ? Non-ischemic  ? CHF (congestive heart failure) ([place])   ? Diabetes mellitus without complication ([place])   ? HTN (hypertension)   ? Hyperlipidemia   ? Hypokalemia   ? Obesity   ? OSA (obstructive sleep apnea)   ? Sleep apnea   ? had surgery- no cpap  ? Stroke [facility]) 10/2008  ? Systolic CHF ([place])   ? ?Past Surgical History:  ?Procedure Laterality Date  ? avulsion fracture of left hip    ? CARDIAC CATHETERIZATION  10/08/08  ? LEFT HEART CATH AND CORONARY ANGIOGRAPHY N/A 08/03/2016  ? Procedure: Left Heart Cath and Coronary Angiography;  Surgeon: Jolaine Artist, MD;  Location: [place] [facility];  Service: Cardiovascular;  Laterality: N/A;  ? TONSILLECTOMY    ? TOTAL KNEE ARTHROPLASTY Right 04/21/2021  ? Procedure: RIGHT TOTAL KNEE ARTHROPLASTY;  Surgeon: Meredith Pel, MD;  Location: [place];  Service: Orthopedics;  Laterality: Right;  ? UVULOPALATOPHARYNGOPLASTY    ? ?Patient Active Problem List  ? Diagnosis Date Noted  ? Arthritis of right knee   ? S/P total knee arthroplasty, right 04/21/2021  ? Chest discomfort 07/28/2016  ? Controlled type 2 diabetes mellitus without complication ([place]) 62/13/0865  ? Bilateral shoulder pain 10/31/2013  ? Erectile dysfunction 04/18/2013  ? Mixed  hyperlipidemia 11/26/2009  ? LIMB PAIN 07/23/2009  ? GERD 04/29/2009  ? Obesity 10/22/2008  ? OBSTRUCTIVE SLEEP APNEA 10/22/2008  ? Essential hypertension 10/22/2008  ? CARDIOMYOPATHY 10/22/2008  ? CONGESTIVE HEART FAILURE, MILD 10/22/2008  ? Chronic systolic heart failure ([place]) 10/16/2008  ? ? ?REFERRING DIAG: Z98.890 (ICD-10-CM) - Post-operative state Z96.659 (ICD-10-CM) - S/P TKR (total knee replacement)  ? ?ONSET DATE: 04/21/2021 ? ?THERAPY DIAG:  ?Difficulty in walking, not elsewhere classified ? ?Muscle weakness (generalized) ? ?Localized edema ? ?Stiffness of right knee, not elsewhere classified ? ?Right knee pain, unspecified chronicity ? ?PERTINENT HISTORY: Previous Lt hip fracture, cardiac history, DM and previous stroke. ? ?PRECAUTIONS: Knee ? ?SUBJECTIVE: Caleb Garcia is feeling more positively about returning to work June 5.  Caleb Garcia feels like he still benefits from the gabapentin and meloxicam.  He has been  more consistent with his HEP since his last visit. ? ?PAIN:  ?Are you having pain? Yes ?NPRS scale: 2-5/10 ?Pain location: Knee ?Pain orientation: Right ?PAIN TYPE: surgical ?Pain description : pressure within joint on Rt ?Aggravating factors: nothing specific reported ?Relieving factors: rest ? ?PATIENT GOALS Return to work, be able to walk normally and do stairs without pain. ?  ?  ?OBJECTIVE:  ?  ?  ?PATIENT  SURVEYS:  ?08/17/2021 FOTO 63 (Goal 68) ?07/16/2021 FOTO 69, Goal met) ?05/07/2021 FOTO 49 (Goal 68 in 14 visits) ?  ?COGNITION: ?         Overall cognitive status: Within functional limits for tasks assessed              ?          ?LE AROM/PROM: ?  ?A/PROM Right ?05/07/2021 Left ?05/07/2021 RLE ?05/13/21 R LE 05/27/2021 R LE 06/03/2021 R LE AROM 06/05/2021 R LE AROM 07/03/2021 Rt LE AROM ?4/3/ 2023 Rt assessed supine 07/16/2021 R knee/hip ? AROM assessed supine 07/29/2021 R knee/hip ? AROM assessed supine 08/17/2021  ?Hip flexion            90 degrees   ?Hamstrings length            60 degrees   ?Hip abduction                ?Hip adduction               ?Hip internal rotation            11 degrees   ?Hip external rotation            24 degrees   ?Knee flexion 74 122 PROM 96* ?Seated after man 102 AROM 94 AROM (edema limited) 96 degrees AROM 101 degrees AROM 102 degrees ?PROM: 105 degrees 107 degrees 107 degrees 109 degrees  ?Knee extension -8 0 PROM -4* ?Supine after man -3 degrees AROM -2 degrees AROM -3 degrees AROM 0 degrees 0 degrees 0 degrees 0 degrees 0 degrees  ?Ankle dorsiflexion               ?Ankle plantarflexion               ?Ankle inversion               ?Ankle eversion               ? (Blank rows = not tested) ?  ?LE MMT: ?  ?MMT  Right ?05/07/2021 Left ?05/07/2021 Right ?07/20/2021 Left ?07/20/2021  ?Hip flexion        ?Hip extension        ?Hip abduction        ?Hip adduction        ?Hip internal rotation        ?Hip external rotation        ?Knee flexion        ?Knee extension     5/5 ?77.2, 73 lbs 5/5 ?79.4, 76 lbs  ?Ankle dorsiflexion        ?Ankle plantarflexion        ?Ankle inversion        ?Ankle eversion        ? (Blank rows = not tested) ? ?Girth: ?07/16/2021:  Lt/Rt thigh girth in cm assessed 15 cm proximal to the superior patellar pole Lt 58.5, Rt 58 ?  ?  ?TODAY'S TREATMENT: ?08/19/2021 ?Aerobic: Recumbent bike Seat 7 for 8 minutes Full range Level 5-6 ? ?Prone: Quadriceps stretch 5X 20 seconds with belt over opposite shoulder ? ?Seated: Tailgate knee flexion 1 minute;  ? ?AAROM knee flexion (L pushes R into flexion) 5X 10 seconds ?   ?Seated straight leg raises 3 sets of 5 for 3 seconds with slow eccentrics 4# ? ? ?Neuromuscular re-education: Single-leg stance: on foam; head turning; eyes closed 2X 20 seconds each B ?  ? ?Functional Activities:  ?  Step-down off 14 inch step 10 each B with slow eccentrics ? ?Leg Press R or L leg only push quickly into extension and slow into flexion 10X slow eccentrics 100# ? ?Sit to stand slow eccentrics 10X ? ? ?08/17/2021 ?Aerobic: Recumbent bike Seat 7 for 8 minutes Full  range Level 5 ? ?Prone: Quadriceps stretch 5X 20 seconds with belt over opposite shoulder ? ?Seated: Tailgate knee flexion 1 minute;  ? ?AAROM knee flexion (L pushes R into flexion) 5X 10 seconds ?   ?Seated straight leg raises 3 sets of 5 for 3 seconds with slow eccentrics 3.5# ? ? ?Neuromuscular re-education: Single-leg stance on foam 6X 10 seconds B ?  ? ?Functional Activities:  ?Step-down off 16 & 13 inch step 10 each B with slow eccentrics ? ?Leg Press R or L leg only push quickly into extension and slow into flexion 10X slow eccentrics 100# ? ?Sit to stand slow eccentrics 10X ? ? ?07/29/2021 ?Aerobic: Recumbent bike Seat 7 for 8 minutes Full range ? ?Prone: Quadriceps stretch 5X 20 seconds with belt over opposite shoulder ? ?Seated: Tailgate knee flexion 1 minute;  ? ?AAROM knee flexion (L pushes R into flexion) 5X 10 seconds ?   ?Knee extension machine (up with B, 90-40 degrees, slow eccentrics down 1 leg only) 2 sets of 5 R and L 35# ?   ?Supine hip flexors stretch (R leg hangs off table) 4X 20 seconds ? ?  Figure 4 stretch 4X 20 seconds ? ? ?Neuromuscular re-education: Single-leg stance on foam 5X 20 seconds B ?  ? ?Functional Activities:  ?Step-down off 8 inch step 2 sets of 10 B with slow eccentrics ? ?Leg Press R or L leg only push quickly into extension and slow into flexion 15X slow eccentrics 100# ? ?(HEP, not done in office today) Sit to stand slow eccentrics 10X ? ?Modalities:  Vaso R knee high 34* 10 minutes ? ? ? ?PATIENT EDUCATION:  ?07/03/2021: ?Education details: HEP review with edema control, flexion AROM and quadriceps strength as the primary emphasis.  Reviewed reassessment. ?Person educated: Patient ?Education method: Explanation, Demonstration, Tactile cues, Verbal cues, and Handouts ?Education comprehension: verbalized understanding, returned demonstration, verbal cues required, tactile cues required, and needs further education ?  ?  ?HOME EXERCISE PROGRAM: ?Access Code: [email or web address] ?URL:  [email or web address] ?Date: 07/29/2021 ?Prepared by: Vista Mink ? ?Exercises ?- Supine Quadricep Sets  - 3-5 x daily - 7 x weekly - 2-3 sets - 10 reps - 5 second hold ?- Seated Knee Flexi

## 2021-08-25 ENCOUNTER — Telehealth: Payer: Self-pay | Admitting: Orthopedic Surgery

## 2021-08-25 NOTE — Telephone Encounter (Signed)
[employer] forms received. To ciox. °

## 2021-08-26 ENCOUNTER — Encounter: Payer: BC Managed Care – PPO | Admitting: Rehabilitative and Restorative Service Providers"

## 2021-08-26 ENCOUNTER — Telehealth: Payer: Self-pay | Admitting: Rehabilitative and Restorative Service Providers"

## 2021-08-26 NOTE — Telephone Encounter (Signed)
No show.  Spoke to [facility] and he will be in next Wednesday at 1 PM.

## 2021-09-02 ENCOUNTER — Encounter: Payer: Self-pay | Admitting: Rehabilitative and Restorative Service Providers"

## 2021-09-02 ENCOUNTER — Ambulatory Visit (INDEPENDENT_AMBULATORY_CARE_PROVIDER_SITE_OTHER): Payer: BC Managed Care – PPO | Admitting: Rehabilitative and Restorative Service Providers"

## 2021-09-02 DIAGNOSIS — M25661 Stiffness of right knee, not elsewhere classified: Secondary | ICD-10-CM | POA: Diagnosis not present

## 2021-09-02 DIAGNOSIS — R600 Localized edema: Secondary | ICD-10-CM | POA: Diagnosis not present

## 2021-09-02 DIAGNOSIS — M6281 Muscle weakness (generalized): Secondary | ICD-10-CM

## 2021-09-02 DIAGNOSIS — M25561 Pain in right knee: Secondary | ICD-10-CM

## 2021-09-02 DIAGNOSIS — R262 Difficulty in walking, not elsewhere classified: Secondary | ICD-10-CM | POA: Diagnosis not present

## 2021-09-02 NOTE — Therapy (Addendum)
OUTPATIENT PHYSICAL THERAPY TREATMENT/DISCHARGE   Patient Name: Caleb Garcia MRN: [id number] DOB:1965/05/08, 56 y.o., male Today's Date: 09/02/2021  PCP: Caleb Peng, NP REFERRING PROVIDER: Meredith Pel, MD   PHYSICAL THERAPY DISCHARGE SUMMARY  Visits from Start of Care: 18  Current functional level related to goals / functional outcomes: See note   Remaining deficits: See note   Education / Equipment: Updated HEP   Patient agrees to discharge. Patient goals were met. Patient is being discharged due to being pleased with the current functional level.    PT End of Session - 09/02/21 1305     Visit Number 18    Number of Visits 18    Date for PT Re-Evaluation 07/31/21    Authorization - Number of Visits 30    Progress Note Due on Visit 18    PT Start Time 1300    PT Stop Time 1343    PT Time Calculation (min) 43 min    Activity Tolerance Patient tolerated treatment well;No increased pain    Behavior During Therapy WFL for tasks assessed/performed                      Past Medical History:  Diagnosis Date   Arthritis    Cardiomyopathy    Non-ischemic   CHF (congestive heart failure) ([facility])    Diabetes mellitus without complication ([facility])    HTN (hypertension)    Hyperlipidemia    Hypokalemia    Obesity    OSA (obstructive sleep apnea)    Sleep apnea    had surgery- no cpap   Stroke ([place]) 16/3846   Systolic CHF [facility])    Past Surgical History:  Procedure Laterality Date   avulsion fracture of left hip     CARDIAC CATHETERIZATION  10/08/08   LEFT HEART CATH AND CORONARY ANGIOGRAPHY N/A 08/03/2016   Procedure: Left Heart Cath and Coronary Angiography;  Surgeon: Caleb Artist, MD;  Location: [place] [facility];  Service: Cardiovascular;  Laterality: N/A;   TONSILLECTOMY     TOTAL KNEE ARTHROPLASTY Right 04/21/2021   Procedure: RIGHT TOTAL KNEE ARTHROPLASTY;  Surgeon: Caleb Pel, MD;  Location: [place];  Service: Orthopedics;   Laterality: Right;   UVULOPALATOPHARYNGOPLASTY     Patient Active Problem List   Diagnosis Date Noted   Arthritis of right knee    S/P total knee arthroplasty, right 04/21/2021   Chest discomfort 07/28/2016   Controlled type 2 diabetes mellitus without complication ([place]) 65/99/3570   Bilateral shoulder pain 10/31/2013   Erectile dysfunction 04/18/2013   Mixed hyperlipidemia 11/26/2009   LIMB PAIN 07/23/2009   GERD 04/29/2009   Obesity 10/22/2008   OBSTRUCTIVE SLEEP APNEA 10/22/2008   Essential hypertension 10/22/2008   CARDIOMYOPATHY 10/22/2008   CONGESTIVE HEART FAILURE, MILD 17/79/3903   Chronic systolic heart failure ([place]) 10/16/2008    REFERRING DIAG: E09.233 (ICD-10-CM) - Post-operative state Z96.659 (ICD-10-CM) - S/P TKR (total knee replacement)   ONSET DATE: 04/21/2021  THERAPY DIAG:  Difficulty in walking, not elsewhere classified  Muscle weakness (generalized)  Localized edema  Stiffness of right knee, not elsewhere classified  Right knee pain, unspecified chronicity  PERTINENT HISTORY: Previous Lt hip fracture, cardiac history, DM and previous stroke.  PRECAUTIONS: Knee  SUBJECTIVE: Caleb Garcia is still taking the gabapentin and tylenol.  He is out of Meloxicam.  He is feeling a bit anxious about returning to work Monday.  PAIN:  Are you having pain? Yes NPRS scale: 2-5/10 Pain location:  Knee Pain orientation: Right PAIN TYPE: surgical Pain description : Ache Aggravating factors: Worse with bad weather and too much WB Relieving factors: Ice and exercises  PATIENT GOALS Return to work, be able to walk normally and do stairs without pain.     OBJECTIVE:      PATIENT SURVEYS:  09/02/2021 FOTO 59 08/17/2021 FOTO 63 (Goal 68) 07/16/2021 FOTO 69, Goal met) 05/07/2021 FOTO 49 (Goal 68 in 14 visits)   COGNITION:          Overall cognitive status: Within functional limits for tasks assessed                        LE AROM/PROM:   A/PROM Right 05/07/2021  Left 05/07/2021 RLE 05/13/21 R LE 05/27/2021 R LE 06/03/2021 R LE AROM 06/05/2021 R LE AROM 07/03/2021 Rt LE AROM 4/3/ 2023 Rt assessed supine 07/16/2021 R knee/hip  AROM assessed supine 07/29/2021 R knee/hip  AROM assessed supine 08/17/2021 R knee AROM 09/02/2021  Hip flexion            90 degrees    Hamstrings length            60 degrees    Hip abduction                Hip adduction                Hip internal rotation            11 degrees    Hip external rotation            24 degrees    Knee flexion 74 122 PROM 96* Seated after man 102 AROM 94 AROM (edema limited) 96 degrees AROM 101 degrees AROM 102 degrees PROM: 105 degrees 107 degrees 107 degrees 109 degrees 110 degrees  Knee extension -8 0 PROM -4* Supine after man -3 degrees AROM -2 degrees AROM -3 degrees AROM 0 _0  degrees  Ankle dorsiflexion                Ankle plantarflexion                Ankle inversion                Ankle eversion                 (Blank rows = not tested)   LE MMT:   MMT  Right 05/07/2021 Left 05/07/2021 Right 07/20/2021 Left 07/20/2021  Hip flexion        Hip extension        Hip abduction        Hip adduction        Hip internal rotation        Hip external rotation        Knee flexion        Knee extension     5/5 77.2, 73 lbs 5/5 79.4, 76 lbs  Ankle dorsiflexion        Ankle plantarflexion        Ankle inversion        Ankle eversion         (Blank rows = not tested)  Girth: 09/02/2021: Lt/Rt thigh girth in cm assessed 15 cm proximal to the superior patellar pole Lt 61.0, Rt 60.5  07/16/2021:  Lt/Rt thigh girth in cm assessed 15 cm proximal to the superior patellar pole Lt  58.5, Rt 58     TODAY'S TREATMENT: 09/02/2021 Aerobic: Recumbent bike Seat 7 for 8 minutes Full range Level 4-5  Prone: Quadriceps stretch 5X 20 seconds with belt over opposite shoulder  Seated: Tailgate knee flexion 1 minute;   AAROM knee flexion (L pushes R into flexion) 5X  10 seconds    Seated straight leg raises 3 sets of 5 for 3 seconds with slow eccentrics 4#   Functional Activities:   Sit to stand slow eccentrics 10X   08/19/2021 Aerobic: Recumbent bike Seat 7 for 8 minutes Full range Level 5-6  Prone: Quadriceps stretch 5X 20 seconds with belt over opposite shoulder  Seated: Tailgate knee flexion 1 minute;   AAROM knee flexion (L pushes R into flexion) 5X 10 seconds    Seated straight leg raises 3 sets of 5 for 3 seconds with slow eccentrics 4#   Neuromuscular re-education: Single-leg stance: on foam; head turning; eyes closed 2X 20 seconds each B    Functional Activities:  Step-down off 14 inch step 10 each B with slow eccentrics  Leg Press R or L leg only push quickly into extension and slow into flexion 10X slow eccentrics 100#  Sit to stand slow eccentrics 10X   08/17/2021 Aerobic: Recumbent bike Seat 7 for 8 minutes Full range Level 5  Prone: Quadriceps stretch 5X 20 seconds with belt over opposite shoulder  Seated: Tailgate knee flexion 1 minute;   AAROM knee flexion (L pushes R into flexion) 5X 10 seconds    Seated straight leg raises 3 sets of 5 for 3 seconds with slow eccentrics 3.5#   Neuromuscular re-education: Single-leg stance on foam 6X 10 seconds B    Functional Activities:  Step-down off 16 & 13 inch step 10 each B with slow eccentrics  Leg Press R or L leg only push quickly into extension and slow into flexion 10X slow eccentrics 100#  Sit to stand slow eccentrics 10X    PATIENT EDUCATION:  07/03/2021: Education details: HEP review with edema control, flexion AROM and quadriceps strength as the primary emphasis.  Reviewed reassessment. Person educated: Patient Education method: Explanation, Demonstration, Tactile cues, Verbal cues, and Handouts Education comprehension: verbalized understanding, returned demonstration, verbal cues required, tactile cues required, and needs further education     HOME  EXERCISE PROGRAM: Access Code: [email or web address] URL: [email or web address] Date: 07/29/2021 Prepared by: Vista Mink  Exercises - Supine Quadricep Sets  - 3-5 x daily - 7 x weekly - 2-3 sets - 10 reps - 5 second hold - Seated Knee Flexion AAROM  - 3 x daily - 7 x weekly - 1 sets - 1 reps - 3 minutes hold - Supine Knee Extension Strengthening  - 1 x daily - 7 x weekly - 2 sets - 10 reps - Supine Active Straight Leg Raise  - 2 x daily - 7 x weekly - 2 sets - 10 reps - Supine Heel Slide  - 1 x daily - 7 x weekly - 2 sets - 10 reps - Prone Quadricep Stretch with Strap  - 2-3 x daily - 7 x weekly - 1 sets - 5 reps - 20 seconds hold - Sit to Stand with Armchair  - 1 x daily - 7 x weekly - 2-3 sets - 10 reps - Lateral Step Down  - 1 x daily - 7 x weekly - 2-3 sets - 10 reps - Modified Thomas Stretch  - 2 x daily - 7 x weekly - 1 sets - 4-5  reps - 20 seconds hold - Supine Figure 4 Piriformis Stretch  - 2 x daily - 7 x weekly - 1 sets - 4-5 reps - 20 seconds hold   ASSESSMENT:   CLINICAL IMPRESSION:  Cyruss has great AROM (0-110 degrees).  Thigh atrophy assessed 15 cm proximal to the superior patellar pole is minimal.  His FOTO was lower today and I believe this is due to the poor weather (pressure changes) the past 3 days and Mike's concern with either returning to work vs taking a new job next week.  He has done great with all functional activities and work-specific activities on previous visits and although he will likely have some pain and soreness upon return to full-time work, this should improve after a few weeks.  He was given the option of following-up with Dr. Marlou Sa to seek additional PT but chooses (correctly I believe) to transfer into independent PT.  He is welcome to return if requested.  REHAB POTENTIAL: Good   CLINICAL DECISION MAKING: Stable/uncomplicated   EVALUATION COMPLEXITY: Low     GOALS: Goals reviewed with patient? Yes   SHORT TERM GOALS:   STG Name Target  Date Goal status  1 Sony will have R knee AROM at -4 to 90 degrees. Baseline: -2 to 92 degrees (was -8 to 74 degrees) 06/04/2021 Met    LONG TERM GOALS:    LTG Name Target Date Goal status  1 Improve FOTO to 68 in 14 visits. Baseline: 49 07/31/2021 69 best, Met  2 Improve Rt knee pain to 0-4/10 (0-3/10 long-term) on the VAS. Baseline: 7/10 07/31/2021 On Going  - assessed 09/02/2021  3 Improve Rt knee AROM to 0-110 degrees. B 07/31/2021 Met 0 to 110 09/02/2021  4 Improve Rt quadriceps strength as assessed by objective testing and functional scores.  07/31/2021 Met  - assessed 07/20/2021  5 Travas will be independent with his long-term HEP at DC.  07/31/2021 Met - assessed 09/02/2021    PLAN: PT FREQUENCY:  DC   PT DURATION: DC   PLANNED INTERVENTIONS: Therapeutic exercises, Therapeutic activity, Neuro Muscular re-education, Balance training, Gait training, Patient/Family education, Stair training, Cryotherapy, Vasopneumatic device, and Manual therapy   PLAN FOR NEXT [place] PT, MPT 09/02/21  5:15 PM

## 2021-09-04 ENCOUNTER — Encounter: Payer: BC Managed Care – PPO | Admitting: Rehabilitative and Restorative Service Providers"

## 2021-09-07 ENCOUNTER — Other Ambulatory Visit: Payer: Self-pay | Admitting: Adult Health

## 2021-09-07 DIAGNOSIS — I10 Essential (primary) hypertension: Secondary | ICD-10-CM

## 2021-09-07 DIAGNOSIS — I5022 Chronic systolic (congestive) heart failure: Secondary | ICD-10-CM

## 2021-12-14 ENCOUNTER — Other Ambulatory Visit: Payer: Self-pay | Admitting: Adult Health

## 2021-12-14 DIAGNOSIS — E119 Type 2 diabetes mellitus without complications: Secondary | ICD-10-CM

## 2022-01-05 ENCOUNTER — Other Ambulatory Visit: Payer: Self-pay | Admitting: Adult Health

## 2022-01-05 DIAGNOSIS — I5022 Chronic systolic (congestive) heart failure: Secondary | ICD-10-CM

## 2022-01-05 DIAGNOSIS — I10 Essential (primary) hypertension: Secondary | ICD-10-CM

## 2022-03-01 ENCOUNTER — Encounter: Payer: Self-pay | Admitting: *Deleted

## 2022-03-01 ENCOUNTER — Telehealth: Payer: Self-pay | Admitting: *Deleted

## 2022-03-01 NOTE — Patient Outreach (Signed)
  Care Coordination   Initial Visit Note   03/01/2022 Name: Caleb Garcia MRN: [id number] DOB: 11-20-1965  Caleb Garcia is a 56 y.o. year old male who sees Nafziger, Kandee Keen, NP for primary care. I spoke with  Sandie Ano by phone today.  What matters to the patients health and wellness today?  Needs assistance with Utilities    Goals Addressed               This Visit's Progress     COMPLETED: Need assistance with Utilities (pt-stated)        Care Coordination Interventions: Provided education to patient and/or caregiver about advanced directives Reviewed medications with patient and discussed adherence with no needed refills Reviewed scheduled/upcoming provider appointments including sufficient transportation source Social Work referral for assistance with Utilities Enrique Sack [place]) Screening for signs and symptoms of depression related to chronic disease state  Assessed social determinant of health barriers          SDOH assessments and interventions completed:  Yes  SDOH Interventions Today    Flowsheet Row Most Recent Value  SDOH Interventions   Food Insecurity Interventions Intervention Not Indicated  Housing Interventions Intervention Not Indicated  Transportation Interventions Intervention Not Indicated  Utilities Interventions --  [Referral to Bevelyn Ngo BSW]        Care Coordination Interventions:  Yes, provided   Follow up plan: No further intervention required.   Encounter Outcome:  Pt. Visit Completed   Elliot Cousin, RN Care Management Coordinator [facility] Main Office [phone]

## 2022-03-01 NOTE — Patient Instructions (Signed)
Visit Information  Thank you for taking time to visit with me today. Please don't hesitate to contact me if I can be of assistance to you.   Following are the goals we discussed today:   Goals Addressed               This Visit's Progress     COMPLETED: Need assistance with Utilities (pt-stated)        Care Coordination Interventions: Provided education to patient and/or caregiver about advanced directives Reviewed medications with patient and discussed adherence with no needed refills Reviewed scheduled/upcoming provider appointments including sufficient transportation source Social Work referral for assistance with Utilities Enrique Sack [place]) Screening for signs and symptoms of depression related to chronic disease state  Assessed social determinant of health barriers          Please call the care guide team at [phone] if you need to cancel or reschedule your appointment.   If you are experiencing a Mental Health or Behavioral Health Crisis or need someone to talk to, please call the Suicide and Crisis Lifeline: 988  Patient verbalizes understanding of instructions and care plan provided today and agrees to view in MyChart. Active MyChart status and patient understanding of how to access instructions and care plan via MyChart confirmed with patient.     No further follow up required: No further follow up needed.  Elliot Cousin, RN Care Management Coordinator [facility] Main Office [phone]

## 2022-03-03 ENCOUNTER — Telehealth: Payer: Self-pay

## 2022-03-03 NOTE — Patient Outreach (Signed)
  Care Coordination   03/03/2022 Name: Caleb Garcia MRN: [id number] DOB: 03/05/1966   Care Coordination Outreach Attempts:  An unsuccessful telephone outreach was attempted today to offer the patient information about available care coordination services as a benefit of their health plan.   Follow Up Plan:  Additional outreach attempts will be made to offer the patient care coordination information and services.   Encounter Outcome:  No Answer   Care Coordination Interventions:  No, not indicated    Bevelyn Ngo, BSW, CDP Social Worker, Certified Dementia Practitioner [facility] Care Management  Care Coordination [phone]

## 2022-03-08 ENCOUNTER — Ambulatory Visit: Payer: Self-pay

## 2022-03-08 NOTE — Patient Instructions (Signed)
Visit Information  Thank you for taking time to visit with me today. Please don't hesitate to contact me if I can be of assistance to you.   Following are the goals we discussed today:   Goals Addressed             This Visit's Progress    COMPLETED: Care Coordination Activities       Care Coordination Interventions: Determined the patient has paid his utility costs and is up to date Discussed the patient is concerned he will not be able to afford his rent this month as it is due tomorrow and he does not have the total amount on hand at this time Patient reports he will begin working full-time next month and will be able to pay his rent at that time Encouraged the patient to speak with his landlord about an extension Provided the patient with contact numbers to DSS, Holiday representative, and Copywriter, advertising Project to inquire if assistance funds are available         If you are experiencing a Mental Health or [facility] Crisis or need someone to talk to, please call 1-800-273-TALK (toll free, 24 hour hotline)  Patient verbalizes understanding of instructions and care plan provided today and agrees to view in [place]. Active MyChart status and patient understanding of how to access instructions and care plan via MyChart confirmed with patient.     No further follow up required: Please contact your primary care provider as needed  Bevelyn Ngo, Kenard Gower, CDP Social Worker, Certified Dementia Practitioner [facility] Care Management  Care Coordination [phone]

## 2022-03-08 NOTE — Patient Outreach (Signed)
  Care Coordination   Follow Up Visit Note   03/08/2022 Name: SRIJAN GIVAN MRN: [id number] DOB: 02-14-1966  HARSHAL SIRMON is a 56 y.o. year old male who sees Nafziger, Kandee Keen, NP for primary care. I spoke with  Sandie Ano by phone today.  What matters to the patients health and wellness today?  Resource Linking    Goals Addressed             This Visit's Progress    COMPLETED: Care Coordination Activities       Care Coordination Interventions: Determined the patient has paid his utility costs and is up to date Discussed the patient is concerned he will not be able to afford his rent this month as it is due tomorrow and he does not have the total amount on hand at this time Patient reports he will begin working full-time next month and will be able to pay his rent at that time Encouraged the patient to speak with his landlord about an extension Provided the patient with contact numbers to DSS, Holiday representative, and Designer, fashion/clothing to inquire if assistance funds are available         SDOH assessments and interventions completed:  Yes  SDOH Interventions Today    Flowsheet Row Most Recent Value  SDOH Interventions   Housing Interventions Other (Comment)  [provided contacts to resources for rent assistance]  Transportation Interventions Intervention Not Indicated  Utilities Interventions Intervention Not Indicated        Care Coordination Interventions:  Yes, provided   Follow up plan: No further intervention required.   Encounter Outcome:  Pt. Visit Completed   Bevelyn Ngo, BSW, CDP Social Worker, Certified Dementia Practitioner [facility] Care Management  Care Coordination [phone]

## 2022-04-01 ENCOUNTER — Other Ambulatory Visit: Payer: Self-pay | Admitting: Adult Health

## 2022-04-01 DIAGNOSIS — I10 Essential (primary) hypertension: Secondary | ICD-10-CM

## 2022-04-01 DIAGNOSIS — I5022 Chronic systolic (congestive) heart failure: Secondary | ICD-10-CM

## 2022-04-01 DIAGNOSIS — E782 Mixed hyperlipidemia: Secondary | ICD-10-CM

## 2022-04-16 ENCOUNTER — Ambulatory Visit (INDEPENDENT_AMBULATORY_CARE_PROVIDER_SITE_OTHER): Payer: Self-pay | Admitting: Adult Health

## 2022-04-16 ENCOUNTER — Encounter: Payer: Self-pay | Admitting: Adult Health

## 2022-04-16 VITALS — BP 140/100 | HR 90 | Temp 98.4°F | Ht 70.0 in | Wt 258.0 lb

## 2022-04-16 DIAGNOSIS — I5022 Chronic systolic (congestive) heart failure: Secondary | ICD-10-CM

## 2022-04-16 DIAGNOSIS — E782 Mixed hyperlipidemia: Secondary | ICD-10-CM

## 2022-04-16 DIAGNOSIS — I10 Essential (primary) hypertension: Secondary | ICD-10-CM

## 2022-04-16 DIAGNOSIS — E1169 Type 2 diabetes mellitus with other specified complication: Secondary | ICD-10-CM

## 2022-04-16 DIAGNOSIS — N521 Erectile dysfunction due to diseases classified elsewhere: Secondary | ICD-10-CM

## 2022-04-16 DIAGNOSIS — Z125 Encounter for screening for malignant neoplasm of prostate: Secondary | ICD-10-CM

## 2022-04-16 DIAGNOSIS — Z1159 Encounter for screening for other viral diseases: Secondary | ICD-10-CM

## 2022-04-16 DIAGNOSIS — Z1211 Encounter for screening for malignant neoplasm of colon: Secondary | ICD-10-CM

## 2022-04-16 DIAGNOSIS — Z0000 Encounter for general adult medical examination without abnormal findings: Secondary | ICD-10-CM

## 2022-04-16 MED ORDER — SPIRONOLACTONE 25 MG PO TABS: 50.0000 mg | ORAL_TABLET | Freq: Every day | ORAL | 3 refills | Status: DC

## 2022-04-16 MED ORDER — FELODIPINE ER 2.5 MG PO TB24: 2.5000 mg | ORAL_TABLET | Freq: Every day | ORAL | 3 refills | Status: DC

## 2022-04-16 MED ORDER — METFORMIN HCL 500 MG PO TABS: 500.0000 mg | ORAL_TABLET | Freq: Two times a day (BID) | ORAL | 0 refills | Status: DC

## 2022-04-16 MED ORDER — ATORVASTATIN CALCIUM 20 MG PO TABS: 20.0000 mg | ORAL_TABLET | Freq: Every day | ORAL | 3 refills | Status: DC

## 2022-04-16 MED ORDER — LISINOPRIL 40 MG PO TABS: 40.0000 mg | ORAL_TABLET | Freq: Every day | ORAL | 3 refills | Status: DC

## 2022-04-16 MED ORDER — HYDRALAZINE HCL 25 MG PO TABS: 25.0000 mg | ORAL_TABLET | Freq: Three times a day (TID) | ORAL | 3 refills | Status: DC

## 2022-04-16 MED ORDER — TADALAFIL 20 MG PO TABS: 20.0000 mg | ORAL_TABLET | Freq: Every day | ORAL | 6 refills | Status: DC | PRN

## 2022-04-16 MED ORDER — FUROSEMIDE 40 MG PO TABS: 40.0000 mg | ORAL_TABLET | Freq: Every day | ORAL | 3 refills | Status: DC

## 2022-04-16 MED ORDER — CARVEDILOL 25 MG PO TABS: 25.0000 mg | ORAL_TABLET | Freq: Two times a day (BID) | ORAL | 3 refills | Status: DC

## 2022-04-16 NOTE — Progress Notes (Signed)
Subjective:    Patient ID: Caleb Garcia, male    DOB: 1965/09/22, 56 y.o.   MRN: [id number]  HPI  Patient presents for yearly preventative medicine examination. He is a pleasant 57 year old male who  has a past medical history of Arthritis, Cardiomyopathy, CHF (congestive heart failure) ([place]), Diabetes mellitus without complication ([place]), HTN (hypertension), Hyperlipidemia, Hypokalemia, Obesity, OSA (obstructive sleep apnea), Sleep apnea, Stroke ([place]) (11/6759), and Systolic CHF ([place]).  He has been out of his medications for the last few weeks.   Diabetes mellitus type 2-managed with metformin 500 mg twice daily.  He does not monitor his blood sugars at home on a routine basis.  Denies episodes of hypoglycemia. He has not followed up for routine A1c checks this year Lab Results  Component Value Date   HGBA1C 7.7 (H) 03/26/2021   HTN -managed with lisinopril 40 mg daily, hydralazine 25 mg 3 times daily, Plendil 2.5 mg daily, Lasix 40 mg daily, and Coreg 25 mg daily.  He denies dizziness, lightheadedness, chest pain, or shortness of breath.  His last echo in 2016 showed an EF of 45 to 50%.  RV was normal. BP Readings from Last 3 Encounters:  04/16/22 (!) 140/100  04/23/21 (!) 148/81  04/15/21 127/86    CHF-has been seen in CHF clinic but had not been seen since 2019.  A referral was placed last year during his CPE but it does not appear as though he went to his appointment.  He is managed with Coreg, hydralazine, and Lasix.  Denies lower extremity edema, chest pain, shortness of breath, or palpitations  ED-uses Cialis as needed  All immunizations and health maintenance protocols were reviewed with the patient and needed orders were placed.  Appropriate screening laboratory values were ordered for the patient including screening of hyperlipidemia, renal function and hepatic function.  Medication reconciliation,  past medical history, social history, problem list and allergies were  reviewed in detail with the patient  Goals were established with regard to weight loss, exercise, and  diet in compliance with medications Wt Readings from Last 3 Encounters:  04/16/22 258 lb (117 kg)  04/21/21 241 lb (109.3 kg)  04/15/21 241 lb 6.4 oz (109.5 kg)   He is due for colon cancer screening, referrals have been placed previously but appointments were never made.  Review of Systems  Constitutional: Negative.   HENT: Negative.    Eyes: Negative.   Respiratory: Negative.    Cardiovascular: Negative.   Gastrointestinal: Negative.   Endocrine: Negative.   Genitourinary: Negative.   Musculoskeletal:  Positive for arthralgias.  Skin: Negative.   Allergic/Immunologic: Negative.   Neurological: Negative.   Hematological: Negative.   Psychiatric/Behavioral: Negative.    All other systems reviewed and are negative.  Past Medical History:  Diagnosis Date   Arthritis    Cardiomyopathy    Non-ischemic   CHF (congestive heart failure) (HCC)    Diabetes mellitus without complication (HCC)    HTN (hypertension)    Hyperlipidemia    Hypokalemia    Obesity    OSA (obstructive sleep apnea)    Sleep apnea    had surgery- no cpap   Stroke ([place]) 95/0932   Systolic CHF ([place])     Social History   Socioeconomic History   Marital status: Married    Spouse name: Not on file   Number of children: Not on file   Years of education: Not on file   Highest education level: Not on file  Occupational History    Employer: UNEMPLOYED  Tobacco Use   Smoking status: Some Days    Types: Cigars   Smokeless tobacco: Never  Vaping Use   Vaping Use: Never used  Substance and Sexual Activity   Alcohol use: No   Drug use: No   Sexual activity: Not on file  Other Topics Concern   Not on file  Social History Narrative   Married 6 years   2 biologic daughters   2 stepsons, one Programme researcher, broadcasting/film/video for [employer]    Social Determinants of Health   Financial Resource Strain: Not on  file  Food Insecurity: No Food Insecurity (03/01/2022)   Hunger Vital Sign    Worried About Running Out of Food in the Last Year: Never true    Ran Out of Food in the Last Year: Never true  Transportation Needs: No Transportation Needs (03/08/2022)   PRAPARE - Administrator, Civil Service (Medical): No    Lack of Transportation (Non-Medical): No  Physical Activity: Not on file  Stress: Not on file  Social Connections: Not on file  Intimate Partner Violence: Not on file    Past Surgical History:  Procedure Laterality Date   avulsion fracture of left hip     CARDIAC CATHETERIZATION  10/08/08   LEFT HEART CATH AND CORONARY ANGIOGRAPHY N/A 08/03/2016   Procedure: Left Heart Cath and Coronary Angiography;  Surgeon: Dolores Patty, MD;  Location: [facility] INVASIVE CV LAB;  Service: Cardiovascular;  Laterality: N/A;   TONSILLECTOMY     TOTAL KNEE ARTHROPLASTY Right 04/21/2021   Procedure: RIGHT TOTAL KNEE ARTHROPLASTY;  Surgeon: Cammy Copa, MD;  Location: [facility] OR;  Service: Orthopedics;  Laterality: Right;   UVULOPALATOPHARYNGOPLASTY      Family History  Problem Relation Age of Onset   Diabetes Mother    Hypertension Mother    Heart disease Mother    Cancer Father        Throat cancer   Stroke Father    Stroke Brother 44   Prostate cancer Neg Hx    Colon cancer Neg Hx    Colon polyps Neg Hx    Esophageal cancer Neg Hx    Rectal cancer Neg Hx    Stomach cancer Neg Hx     Allergies  Allergen Reactions   Other     Almonds-caused swelling in hands   Shrimp [Shellfish Allergy] Swelling    Current Outpatient Medications on File Prior to Visit  Medication Sig Dispense Refill   acetaminophen (TYLENOL) 500 MG tablet Take 500 mg by mouth every 6 (six) hours as needed for mild pain.     aspirin 81 MG tablet Take 81 mg by mouth daily.       Chlorpheniramine-DM (CORICIDIN HBP COUGH/COLD PO) Take 1 tablet by mouth every 12 (twelve) hours as needed (cold symptoms).      cyclobenzaprine (FLEXERIL) 5 MG tablet Take 1 tablet (5 mg total) by mouth 3 (three) times daily as needed for muscle spasms. 30 tablet 0   docusate sodium (COLACE) 100 MG capsule Take 1 capsule (100 mg total) by mouth 2 (two) times daily. 10 capsule 0   gabapentin (NEURONTIN) 300 MG capsule Take 1 capsule (300 mg total) by mouth 3 (three) times daily. 30 capsule 0   HYDROcodone-acetaminophen (NORCO/VICODIN) 5-325 MG tablet Take 1 tablet by mouth every 8 (eight) hours as needed for moderate pain. 30 tablet 0   meloxicam (MOBIC) 15 MG tablet Take  1 tablet (15 mg total) by mouth daily with breakfast. 30 tablet 1   potassium chloride (KLOR-CON M) 10 MEQ tablet Take 1 tablet by mouth once daily 90 tablet 3   No current facility-administered medications on file prior to visit.    BP (!) 140/100   Pulse 90   Temp 98.4 F (36.9 C) (Oral)   Ht 5\' 10"  (1.778 m)   Wt 258 lb (117 kg)   SpO2 98%   BMI 37.02 kg/m       Objective:   Physical Exam Vitals and nursing note reviewed.  Constitutional:      General: He is not in acute distress.    Appearance: Normal appearance. He is well-developed. He is obese.  HENT:     Head: Normocephalic and atraumatic.     Right Ear: Tympanic membrane, ear canal and external ear normal. There is no impacted cerumen.     Left Ear: Tympanic membrane, ear canal and external ear normal. There is no impacted cerumen.     Nose: Nose normal. No congestion or rhinorrhea.     Mouth/Throat:     Mouth: Mucous membranes are moist.     Pharynx: Oropharynx is clear. No oropharyngeal exudate or posterior oropharyngeal erythema.  Eyes:     General:        Right eye: No discharge.        Left eye: No discharge.     Extraocular Movements: Extraocular movements intact.     Conjunctiva/sclera: Conjunctivae normal.     Pupils: Pupils are equal, round, and reactive to light.  Neck:     Vascular: No carotid bruit.     Trachea: No tracheal deviation.  Cardiovascular:      Rate and Rhythm: Normal rate and regular rhythm.     Pulses: Normal pulses.     Heart sounds: Normal heart sounds. No murmur heard.    No friction rub. No gallop.  Pulmonary:     Effort: Pulmonary effort is normal. No respiratory distress.     Breath sounds: Normal breath sounds. No stridor. No wheezing, rhonchi or rales.  Chest:     Chest wall: No tenderness.  Abdominal:     General: Bowel sounds are normal. There is no distension.     Palpations: Abdomen is soft. There is no mass.     Tenderness: There is no abdominal tenderness. There is no right CVA tenderness, left CVA tenderness, guarding or rebound.     Hernia: No hernia is present.  Musculoskeletal:        General: No swelling, tenderness, deformity or signs of injury. Normal range of motion.     Right lower leg: No edema.     Left lower leg: No edema.  Lymphadenopathy:     Cervical: No cervical adenopathy.  Skin:    General: Skin is warm and dry.     Capillary Refill: Capillary refill takes less than 2 seconds.     Coloration: Skin is not jaundiced or pale.     Findings: No bruising, erythema, lesion or rash.  Neurological:     General: No focal deficit present.     Mental Status: He is alert and oriented to person, place, and time.     Cranial Nerves: No cranial nerve deficit.     Sensory: No sensory deficit.     Motor: No weakness.     Coordination: Coordination normal.     Gait: Gait normal.     Deep Tendon Reflexes: Reflexes  normal.  Psychiatric:        Mood and Affect: Mood normal.        Behavior: Behavior normal.        Thought Content: Thought content normal.        Judgment: Judgment normal.       Assessment & Plan:  1. Routine general medical examination at a health care facility Today patient counseled on age appropriate routine health concerns for screening and prevention, each reviewed and up to date or declined. Immunizations reviewed and up to date or declined. Labs ordered and reviewed. Risk  factors for depression reviewed and negative. Hearing function and visual acuity are intact. ADLs screened and addressed as needed. Functional ability and level of safety reviewed and appropriate. Education, counseling and referrals performed based on assessed risks today. Patient provided with a copy of personalized plan for preventive services.    2. Type 2 diabetes mellitus with other specified complication, without long-term current use of insulin (HCC) -Reiterated that I would like to see him every 3 months for diabetes follow-up.  Is in agreement. - CBC with Differential/Platelet; Future - Comprehensive metabolic panel; Future - Hemoglobin A1c; Future - Lipid panel; Future - TSH; Future - Microalbumin/Creatinine Ratio, Urine; Future - metFORMIN (GLUCOPHAGE) 500 MG tablet; Take 1 tablet (500 mg total) by mouth 2 (two) times daily with a meal.  Dispense: 180 tablet; Refill: 0  3. Essential hypertension - Elevated today but has been out of his medications  - CBC with Differential/Platelet; Future - Comprehensive metabolic panel; Future - Hemoglobin A1c; Future - Lipid panel; Future - TSH; Future - PSA; Future - carvedilol (COREG) 25 MG tablet; Take 1 tablet (25 mg total) by mouth 2 (two) times daily with a meal.  Dispense: 180 tablet; Refill: 3 - felodipine (PLENDIL) 2.5 MG 24 hr tablet; Take 1 tablet (2.5 mg total) by mouth daily.  Dispense: 90 tablet; Refill: 3 - furosemide (LASIX) 40 MG tablet; Take 1 tablet (40 mg total) by mouth daily.  Dispense: 90 tablet; Refill: 3 - hydrALAZINE (APRESOLINE) 25 MG tablet; Take 1 tablet (25 mg total) by mouth 3 (three) times daily.  Dispense: 270 tablet; Refill: 3 - lisinopril (ZESTRIL) 40 MG tablet; Take 1 tablet (40 mg total) by mouth daily.  Dispense: 90 tablet; Refill: 3 - spironolactone (ALDACTONE) 25 MG tablet; Take 2 tablets (50 mg total) by mouth daily.  Dispense: 180 tablet; Refill: 3  4. Chronic systolic heart failure (HCC) -  euvolemic today. - CBC with Differential/Platelet; Future - Comprehensive metabolic panel; Future - Hemoglobin A1c; Future - Lipid panel; Future - TSH; Future - Microalbumin/Creatinine Ratio, Urine; Future - carvedilol (COREG) 25 MG tablet; Take 1 tablet (25 mg total) by mouth 2 (two) times daily with a meal.  Dispense: 180 tablet; Refill: 3 - felodipine (PLENDIL) 2.5 MG 24 hr tablet; Take 1 tablet (2.5 mg total) by mouth daily.  Dispense: 90 tablet; Refill: 3 - furosemide (LASIX) 40 MG tablet; Take 1 tablet (40 mg total) by mouth daily.  Dispense: 90 tablet; Refill: 3 - hydrALAZINE (APRESOLINE) 25 MG tablet; Take 1 tablet (25 mg total) by mouth 3 (three) times daily.  Dispense: 270 tablet; Refill: 3 - lisinopril (ZESTRIL) 40 MG tablet; Take 1 tablet (40 mg total) by mouth daily.  Dispense: 90 tablet; Refill: 3 - spironolactone (ALDACTONE) 25 MG tablet; Take 2 tablets (50 mg total) by mouth daily.  Dispense: 180 tablet; Refill: 3 - AMB referral to CHF clinic  5. Mixed  hyperlipidemia - Consider increase in statin  - CBC with Differential/Platelet; Future - Comprehensive metabolic panel; Future - Hemoglobin A1c; Future - Lipid panel; Future - TSH; Future - Microalbumin/Creatinine Ratio, Urine; Future - atorvastatin (LIPITOR) 20 MG tablet; Take 1 tablet (20 mg total) by mouth daily.  Dispense: 90 tablet; Refill: 3  6. Prostate cancer screening  - PSA; Future  7. Erectile dysfunction due to diseases classified elsewhere - Continue with Cialis as needed  8. Need for hepatitis C screening test  - Hep C Antibody; Future  9. Screening for colon cancer  - Ambulatory referral to Gastroenterology  Dorothyann Peng, NP

## 2022-04-16 NOTE — Patient Instructions (Addendum)
It was great seeing you today   We will follow up with you regarding your lab work - please schedule a lab appointment at the front desk.   Please let me know if you need anything   I am going to refer you to the CHF clinic as well as for your colonoscopy

## 2022-04-22 ENCOUNTER — Other Ambulatory Visit: Payer: Self-pay | Admitting: Adult Health

## 2022-04-22 ENCOUNTER — Other Ambulatory Visit: Payer: Self-pay

## 2022-04-22 DIAGNOSIS — I10 Essential (primary) hypertension: Secondary | ICD-10-CM

## 2022-04-22 NOTE — Telephone Encounter (Signed)
Pt was just seen on 04/16/22.  Pt called to remind NP that "he forgot to send the potassium".  Please advise.  [place], [address] Phone: [phone]  Fax: [phone]

## 2022-04-23 ENCOUNTER — Other Ambulatory Visit (INDEPENDENT_AMBULATORY_CARE_PROVIDER_SITE_OTHER): Payer: Self-pay

## 2022-04-23 ENCOUNTER — Other Ambulatory Visit: Payer: Self-pay

## 2022-04-23 DIAGNOSIS — E782 Mixed hyperlipidemia: Secondary | ICD-10-CM

## 2022-04-23 DIAGNOSIS — I5022 Chronic systolic (congestive) heart failure: Secondary | ICD-10-CM

## 2022-04-23 DIAGNOSIS — E1169 Type 2 diabetes mellitus with other specified complication: Secondary | ICD-10-CM

## 2022-04-23 DIAGNOSIS — I10 Essential (primary) hypertension: Secondary | ICD-10-CM

## 2022-04-23 DIAGNOSIS — Z1159 Encounter for screening for other viral diseases: Secondary | ICD-10-CM

## 2022-04-23 DIAGNOSIS — Z125 Encounter for screening for malignant neoplasm of prostate: Secondary | ICD-10-CM

## 2022-04-23 LAB — LIPID PANEL
Cholesterol: 189 mg/dL (ref 0–200)
HDL: 48.9 mg/dL (ref >39.00)
NonHDL: 139.64
Total CHOL/HDL Ratio: 4
Triglycerides: 220 mg/dL — ABNORMAL HIGH (ref 0.0–149.0)
VLDL: 44 mg/dL — ABNORMAL HIGH (ref 0.0–40.0)

## 2022-04-23 LAB — COMPREHENSIVE METABOLIC PANEL
ALT: 15 U/L (ref 0–53)
AST: 15 U/L (ref 0–37)
Albumin: 4.5 g/dL (ref 3.5–5.2)
Alkaline Phosphatase: 42 U/L (ref 39–117)
BUN: 13 mg/dL (ref 6–23)
CO2: 26 mEq/L (ref 19–32)
Calcium: 9.5 mg/dL (ref 8.4–10.5)
Chloride: 102 mEq/L (ref 96–112)
Creatinine, Ser: 0.93 mg/dL (ref 0.40–1.50)
GFR: 91.8 mL/min (ref >60.00)
Glucose, Bld: 159 mg/dL — ABNORMAL HIGH (ref 70–99)
Potassium: 4.1 mEq/L (ref 3.5–5.1)
Sodium: 138 mEq/L (ref 135–145)
Total Bilirubin: 0.6 mg/dL (ref 0.2–1.2)
Total Protein: 7.3 g/dL (ref 6.0–8.3)

## 2022-04-23 LAB — CBC WITH DIFFERENTIAL/PLATELET
Basophils Absolute: 0 10*3/uL (ref 0.0–0.1)
Basophils Relative: 0.4 % (ref 0.0–3.0)
Eosinophils Absolute: 0.1 10*3/uL (ref 0.0–0.7)
Eosinophils Relative: 2 % (ref 0.0–5.0)
HCT: 40.1 % (ref 39.0–52.0)
Hemoglobin: 13.2 g/dL (ref 13.0–17.0)
Lymphocytes Relative: 41.4 % (ref 12.0–46.0)
Lymphs Abs: 2.4 10*3/uL (ref 0.7–4.0)
MCHC: 33 g/dL (ref 30.0–36.0)
MCV: 92.5 fl (ref 78.0–100.0)
Monocytes Absolute: 0.5 10*3/uL (ref 0.1–1.0)
Monocytes Relative: 8.3 % (ref 3.0–12.0)
Neutro Abs: 2.8 10*3/uL (ref 1.4–7.7)
Neutrophils Relative %: 47.9 % (ref 43.0–77.0)
Platelets: 245 10*3/uL (ref 150.0–400.0)
RBC: 4.34 Mil/uL (ref 4.22–5.81)
RDW: 13.7 % (ref 11.5–15.5)
WBC: 5.8 10*3/uL (ref 4.0–10.5)

## 2022-04-23 LAB — HEMOGLOBIN A1C: Hgb A1c MFr Bld: 8.8 % — ABNORMAL HIGH (ref 4.6–6.5)

## 2022-04-23 LAB — PSA: PSA: 0.5 ng/mL (ref 0.10–4.00)

## 2022-04-23 LAB — TSH: TSH: 1.08 u[IU]/mL (ref 0.35–5.50)

## 2022-04-23 LAB — LDL CHOLESTEROL, DIRECT: Direct LDL: 87 mg/dL

## 2022-04-23 MED ORDER — GLIPIZIDE ER 10 MG PO TB24: 10.0000 mg | ORAL_TABLET | Freq: Every day | ORAL | 2 refills | Status: DC

## 2022-05-06 ENCOUNTER — Encounter: Payer: Self-pay | Admitting: Orthopedic Surgery

## 2022-05-06 ENCOUNTER — Ambulatory Visit (INDEPENDENT_AMBULATORY_CARE_PROVIDER_SITE_OTHER): Payer: 59

## 2022-05-06 ENCOUNTER — Ambulatory Visit: Payer: 59 | Admitting: Orthopedic Surgery

## 2022-05-06 ENCOUNTER — Ambulatory Visit: Payer: Self-pay

## 2022-05-06 DIAGNOSIS — Z96651 Presence of right artificial knee joint: Secondary | ICD-10-CM | POA: Diagnosis not present

## 2022-05-06 DIAGNOSIS — M25551 Pain in right hip: Secondary | ICD-10-CM | POA: Diagnosis not present

## 2022-05-06 DIAGNOSIS — M1611 Unilateral primary osteoarthritis, right hip: Secondary | ICD-10-CM

## 2022-05-06 NOTE — Progress Notes (Signed)
Office Visit Note   Patient: Caleb Garcia           Date of Birth: 1965/05/26           MRN: [id number] Visit Date: 05/06/2022 Requested by: Dorothyann Peng, NP [place] [place],  [place] [address] PCP: Dorothyann Peng, NP  Subjective: Chief Complaint  Patient presents with   Right Knee - Pain    HPI: Caleb Garcia is a 57 y.o. male who presents to the office reporting 1 month onset of right knee pain.  Started about a month ago.  States his knee feels okay but he is having some pain radiating down to the medial side of his knee.  Goes from the thigh to the tib-fib region.  States at times it feels like it did before surgery.  Pain does wake him from sleep at night.  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  He had 1 day of burning pain.  Denies much in the way of back pain or groin pain.  Does report some cramping in his toes.  Tylenol does not help..                ROS: All systems reviewed are negative as they relate to the chief complaint within the history of present illness.  Patient denies fevers or chills.  Assessment & Plan: Visit Diagnoses:  1. S/P total knee replacement, right   2. Pain in right hip     Plan: Impression is early right hip osteoarthritis.  Could also be radicular pain.  He does have some joint space narrowing on the right compared to the left on plain radiographs.  Plan at this time is ultrasound-guided hip injection and MRI scan of the lumbar spine to evaluate for radiculopathy.  This will help [place] to determine whether or not intervention should be considered on his hip.  Follow-up in 3 weeks after his MRI scan.  Lumbar spine x-rays do demonstrate some degeneration arthritis and facet arthritis.  Follow-Up Instructions: No follow-ups on file.   Orders:  Orders Placed This Encounter  Procedures   XR KNEE 3 VIEW RIGHT   XR Lumbar Spine 2-3 Views   XR HIP UNILAT W OR W/O PELVIS 2-3 VIEWS RIGHT   US Guided Needle Placement  - No Linked Charges   No orders of the defined types were placed in this encounter.     Procedures: Large Joint Inj: R hip joint on 05/06/2022 2:50 PM Indications: pain and diagnostic evaluation Details: 22 G 3.5 in needle, ultrasound-guided anterior approach  Arthrogram: No  Medications: 5 mL lidocaine 1 %; 80 mg methylPREDNISolone acetate 80 MG/ML; 4 mL bupivacaine 0.25 % Outcome: tolerated well, no immediate complications Procedure, treatment alternatives, risks and benefits explained, specific risks discussed. Consent was given by the patient. Immediately prior to procedure a time out was called to verify the correct patient, procedure, equipment, support staff and site/side marked as required. Patient was prepped and draped in the usual sterile fashion.       Clinical Data: No additional findings.  Objective: Vital Signs: There were no vitals taken for this visit.  Physical Exam:  Constitutional: Patient appears well-developed HEENT:  Head: Normocephalic Eyes:EOM are normal Neck: Normal range of motion Cardiovascular: Normal rate Pulmonary/chest: Effort normal Neurologic: Patient is alert Skin: Skin is warm Psychiatric: Patient has normal mood and affect  Ortho Exam: Ortho evaluation demonstrates normal gait alignment.  Right knee has excellent  range of motion with no effusion.  Extensor mechanism intact.  A little bit less range of motion on the right compared to the left.  Has very good hip flexion abduction adduction strength.  Extensor mechanism intact bilaterally.  No definite nerve root tension signs right versus left.  No paresthesias L1-S1 bilaterally.  Reflexes symmetric.  No trochanteric tenderness is noted.  Mild pain with forward lateral bending.  Specialty Comments:  No specialty comments available.  Imaging: No results found.   PMFS History: Patient Active Problem List   Diagnosis Date Noted   Arthritis of right knee    S/P total knee arthroplasty,  right 04/21/2021   Chest discomfort 07/28/2016   Controlled type 2 diabetes mellitus without complication ([place]) 19/41/7408   Bilateral shoulder pain 10/31/2013   Erectile dysfunction 04/18/2013   Mixed hyperlipidemia 11/26/2009   LIMB PAIN 07/23/2009   GERD 04/29/2009   Obesity 10/22/2008   OBSTRUCTIVE SLEEP APNEA 10/22/2008   Essential hypertension 10/22/2008   CARDIOMYOPATHY 10/22/2008   CONGESTIVE HEART FAILURE, MILD 14/48/1856   Chronic systolic heart failure ([place]) 10/16/2008   Past Medical History:  Diagnosis Date   Arthritis    Cardiomyopathy    Non-ischemic   CHF (congestive heart failure) ([place])    Diabetes mellitus without complication (HCC)    HTN (hypertension)    Hyperlipidemia    Hypokalemia    Obesity    OSA (obstructive sleep apnea)    Sleep apnea    had surgery- no cpap   Stroke ([place]) 31/4970   Systolic CHF ([place])     Family History  Problem Relation Age of Onset   Diabetes Mother    Hypertension Mother    Heart disease Mother    Cancer Father        Throat cancer   Stroke Father    Stroke Brother 17   Prostate cancer Neg Hx    Colon cancer Neg Hx    Colon polyps Neg Hx    Esophageal cancer Neg Hx    Rectal cancer Neg Hx    Stomach cancer Neg Hx     Past Surgical History:  Procedure Laterality Date   avulsion fracture of left hip     CARDIAC CATHETERIZATION  10/08/08   LEFT HEART CATH AND CORONARY ANGIOGRAPHY N/A 08/03/2016   Procedure: Left Heart Cath and Coronary Angiography;  Surgeon: Jolaine Artist, MD;  Location: [facility] CV LAB;  Service: Cardiovascular;  Laterality: N/A;   TONSILLECTOMY     TOTAL KNEE ARTHROPLASTY Right 04/21/2021   Procedure: RIGHT TOTAL KNEE ARTHROPLASTY;  Surgeon: Meredith Pel, MD;  Location: [facility];  Service: Orthopedics;  Laterality: Right;   UVULOPALATOPHARYNGOPLASTY     Social History   Occupational History    Employer: UNEMPLOYED  Tobacco Use   Smoking status: Some Days    Types: Cigars   Smokeless  tobacco: Never  Vaping Use   Vaping Use: Never used  Substance and Sexual Activity   Alcohol use: No   Drug use: No   Sexual activity: Not on file

## 2022-05-09 MED ORDER — LIDOCAINE HCL 1 % IJ SOLN
5.0000 mL | INTRAMUSCULAR | Status: AC | PRN
  Administered 2022-05-06: 5 mL

## 2022-05-09 MED ORDER — BUPIVACAINE HCL 0.25 % IJ SOLN
4.0000 mL | INTRAMUSCULAR | Status: AC | PRN
  Administered 2022-05-06: 4 mL via INTRA_ARTICULAR

## 2022-05-09 MED ORDER — METHYLPREDNISOLONE ACETATE 80 MG/ML IJ SUSP
80.0000 mg | INTRAMUSCULAR | Status: AC | PRN
  Administered 2022-05-06: 80 mg via INTRA_ARTICULAR

## 2022-05-10 ENCOUNTER — Telehealth: Payer: Self-pay

## 2022-05-10 ENCOUNTER — Other Ambulatory Visit: Payer: Self-pay

## 2022-05-10 DIAGNOSIS — M5416 Radiculopathy, lumbar region: Secondary | ICD-10-CM

## 2022-05-10 NOTE — Telephone Encounter (Signed)
Referral placed.

## 2022-05-10 NOTE — Telephone Encounter (Signed)
-----   Message from Meredith Pel, MD sent at 05/09/2022  2:53 PM EST ----- Hi Jalayla Chrismer can you order MRI scan on [place] for right-sided radiculopathy.  Thanks l sp

## 2022-05-26 ENCOUNTER — Encounter: Payer: Self-pay | Admitting: Orthopedic Surgery

## 2022-05-26 ENCOUNTER — Ambulatory Visit (INDEPENDENT_AMBULATORY_CARE_PROVIDER_SITE_OTHER): Payer: 59 | Admitting: Orthopedic Surgery

## 2022-05-26 DIAGNOSIS — M25551 Pain in right hip: Secondary | ICD-10-CM | POA: Diagnosis not present

## 2022-05-26 MED ORDER — ACETAMINOPHEN-CODEINE 300-30 MG PO TABS: ORAL_TABLET | ORAL | 0 refills | Status: DC

## 2022-05-26 MED ORDER — ACETAMINOPHEN-CODEINE 300-30 MG PO TABS: 1.0000 | ORAL_TABLET | Freq: Four times a day (QID) | ORAL | 0 refills | Status: DC | PRN

## 2022-05-26 NOTE — Progress Notes (Signed)
Office Visit Note   Patient: Caleb Garcia           Date of Birth: 1965-10-25           MRN: [id number] Visit Date: 05/26/2022 Requested by: Dorothyann Peng, NP [place],  [place] [id number] PCP: Dorothyann Peng, NP  Subjective: Chief Complaint  Patient presents with   Right Leg - Follow-up, Pain    HPI: Caleb Garcia is a 57 y.o. male who presents to the office reporting continued right leg pain and knee pain.  Underwent right total knee replacement 04/21/2021.  L-spine MRI scheduled for 06/09/2022.  Had right hip injection done 05/06/2022 which gave him 1 to 2 days of good pain relief and then the pain came back.  He describes the pain radiating down to the medial side of his knee.  He has good and bad days.  Primarily medial sided knee pain.  Tylenol has not been helpful.  Cannot take anti-inflammatories because of blood pressure medication.  Describes more pain with weightbearing than when at rest.  Tramadol was not helpful..                ROS: All systems reviewed are negative as they relate to the chief complaint within the history of present illness.  Patient denies fevers or chills.  Assessment & Plan: Visit Diagnoses:  1. Pain in right hip     Plan: Impression is 1 to 2 days of good relief from right hip injection.  He does have what appears to be mild arthritis on plain radiographs.  Does have a slight Trendelenburg gait.  MRI pelvis indicated to evaluate occult arthritis right versus left hip with pain radiating possibly down to the knee.  The knee itself looks reasonable on radiographs.  No evidence of infection or loosening.  MRI scan to continue for 06/09/2022.  Tylenol 3 prescribed.  I think the pelvic MRI is necessary for surgical decision making about hip replacement.  Alternatively this could be coming from his back which should show up on the MRI scan.  Follow-up after those studies.  Follow-Up Instructions: No follow-ups on file.   Orders:  Orders  Placed This Encounter  Procedures   MR Pelvis w/o contrast   Meds ordered this encounter  Medications   DISCONTD: acetaminophen-codeine (TYLENOL #3) 300-30 MG tablet    Sig: Take 1 tablet by mouth every 6 (six) hours as needed for moderate pain.    Dispense:  30 tablet    Refill:  0   acetaminophen-codeine (TYLENOL #3) 300-30 MG tablet    Sig: 1 po q 8-12 hrs prn pain    Dispense:  30 tablet    Refill:  0      Procedures: No procedures performed   Clinical Data: No additional findings.  Objective: Vital Signs: There were no vitals taken for this visit.  Physical Exam:  Constitutional: Patient appears well-developed HEENT:  Head: Normocephalic Eyes:EOM are normal Neck: Normal range of motion Cardiovascular: Normal rate Pulmonary/chest: Effort normal Neurologic: Patient is alert Skin: Skin is warm Psychiatric: Patient has normal mood and affect  Ortho Exam: Ortho exam demonstrates slight Trendelenburg gait to the right.  Not too much in terms of reproducible groin pain at this time with internal and external rotation of both legs.  Knee has trace effusion but no warmth.  Range of motion is excellent.  No focal tenderness to palpation around the medial side of the knee.  The pain is "  deep".  No paresthesias L1 S1 bilaterally.  No muscle atrophy in the leg.  Specialty Comments:  No specialty comments available.  Imaging: No results found.   PMFS History: Patient Active Problem List   Diagnosis Date Noted   Arthritis of right knee    S/P total knee arthroplasty, right 04/21/2021   Chest discomfort 07/28/2016   Controlled type 2 diabetes mellitus without complication ([place]) 0000000   Bilateral shoulder pain 10/31/2013   Erectile dysfunction 04/18/2013   Mixed hyperlipidemia 11/26/2009   LIMB PAIN 07/23/2009   GERD 04/29/2009   Obesity 10/22/2008   OBSTRUCTIVE SLEEP APNEA 10/22/2008   Essential hypertension 10/22/2008   CARDIOMYOPATHY 10/22/2008   CONGESTIVE  HEART FAILURE, MILD 123456   Chronic systolic heart failure ([place]) 10/16/2008   Past Medical History:  Diagnosis Date   Arthritis    Cardiomyopathy    Non-ischemic   CHF (congestive heart failure) ([place])    Diabetes mellitus without complication (HCC)    HTN (hypertension)    Hyperlipidemia    Hypokalemia    Obesity    OSA (obstructive sleep apnea)    Sleep apnea    had surgery- no cpap   Stroke ([place]) 99991111   Systolic CHF ([place])     Family History  Problem Relation Age of Onset   Diabetes Mother    Hypertension Mother    Heart disease Mother    Cancer Father        Throat cancer   Stroke Father    Stroke Brother 59   Prostate cancer Neg Hx    Colon cancer Neg Hx    Colon polyps Neg Hx    Esophageal cancer Neg Hx    Rectal cancer Neg Hx    Stomach cancer Neg Hx     Past Surgical History:  Procedure Laterality Date   avulsion fracture of left hip     CARDIAC CATHETERIZATION  10/08/08   LEFT HEART CATH AND CORONARY ANGIOGRAPHY N/A 08/03/2016   Procedure: Left Heart Cath and Coronary Angiography;  Surgeon: Jolaine Artist, MD;  Location: [facility];  Service: Cardiovascular;  Laterality: N/A;   TONSILLECTOMY     TOTAL KNEE ARTHROPLASTY Right 04/21/2021   Procedure: RIGHT TOTAL KNEE ARTHROPLASTY;  Surgeon: Meredith Pel, MD;  Location: [facility];  Service: Orthopedics;  Laterality: Right;   UVULOPALATOPHARYNGOPLASTY     Social History   Occupational History    Employer: UNEMPLOYED  Tobacco Use   Smoking status: Some Days    Types: Cigars   Smokeless tobacco: Never  Vaping Use   Vaping Use: Never used  Substance and Sexual Activity   Alcohol use: No   Drug use: No   Sexual activity: Not on file

## 2022-05-28 ENCOUNTER — Other Ambulatory Visit: Payer: Self-pay | Admitting: Adult Health

## 2022-05-28 DIAGNOSIS — I10 Essential (primary) hypertension: Secondary | ICD-10-CM

## 2022-06-09 ENCOUNTER — Other Ambulatory Visit: Payer: 59

## 2022-06-16 ENCOUNTER — Ambulatory Visit
Admission: RE | Admit: 2022-06-16 | Discharge: 2022-06-16 | Disposition: A | Discharge status: Home / Self Care | Payer: 59 | Source: Ambulatory Visit | Attending: Orthopedic Surgery | Admitting: Orthopedic Surgery

## 2022-06-16 DIAGNOSIS — M5416 Radiculopathy, lumbar region: Secondary | ICD-10-CM

## 2022-06-16 DIAGNOSIS — M25551 Pain in right hip: Secondary | ICD-10-CM

## 2022-06-29 ENCOUNTER — Telehealth: Payer: Self-pay | Admitting: Orthopedic Surgery

## 2022-06-29 NOTE — Telephone Encounter (Signed)
Patient called. Would like to know if the MRI results could be given over the phone. His call back number is [phone]

## 2022-06-30 ENCOUNTER — Ambulatory Visit (INDEPENDENT_AMBULATORY_CARE_PROVIDER_SITE_OTHER): Payer: 59 | Admitting: Orthopedic Surgery

## 2022-06-30 DIAGNOSIS — M5416 Radiculopathy, lumbar region: Secondary | ICD-10-CM | POA: Diagnosis not present

## 2022-06-30 DIAGNOSIS — M1711 Unilateral primary osteoarthritis, right knee: Secondary | ICD-10-CM

## 2022-06-30 MED ORDER — ACETAMINOPHEN-CODEINE 300-30 MG PO TABS: 1.0000 | ORAL_TABLET | Freq: Four times a day (QID) | ORAL | 0 refills | Status: AC | PRN

## 2022-06-30 NOTE — Telephone Encounter (Signed)
Patient present today to review scans

## 2022-07-04 ENCOUNTER — Encounter: Payer: Self-pay | Admitting: Orthopedic Surgery

## 2022-07-04 NOTE — Progress Notes (Signed)
Office Visit Note   Patient: Caleb Garcia           Date of Birth: 02-May-1965           MRN: [id number] Visit Date: 06/30/2022 Requested by: Dorothyann Peng, NP [place],   [id number] PCP: Dorothyann Peng, NP  Subjective: Chief Complaint  Patient presents with   Other     Scan review    HPI: Caleb Garcia is a 57 y.o. male who presents to the office reporting continued right-sided knee pain and hip pain.  Has about 1 to 77-month history of medial sided right knee pain.  Right total knee replacement was 123.  Since he was last seen has had an MRI scan of the lumbar spine and pelvis.  MRI of the pelvis shows mild to moderate arthritis of bilateral hip joints.  Also has some findings consistent with femoral acetabular impingement.  Lumbar spine MRI shows moderate right neuroforaminal narrowing at L4-5 and L5-S1.  Congenitally short pedicles throughout the lumbar spine with degenerative disc disease and facet arthropathy give moderate spinal canal stenosis at L4-5 and mild stenosis at L3-4.  Patient did have a hip injection 05/06/2022 which gave him 1 to 2 days of relief..                ROS: All systems reviewed are negative as they relate to the chief complaint within the history of present illness.  Patient denies fevers or chills.  Assessment & Plan: Visit Diagnoses: No diagnosis found.  Plan: Impression is medial sided right knee pain which could be coming from the hip or the back.  Overall the knee has good range of motion and no radiographic abnormality.  Little too soon for bone scan to give [place] any meaningful information.  No evidence of loosening.  He does report short-lived improvement in the pain with a right hip injection in February.  Would like to do a second diagnostic injection into the hip on a Wednesday that is convenient for him.  Follow-Up Instructions: No follow-ups on file.   Orders:  No orders of the defined types were placed in this  encounter.  Meds ordered this encounter  Medications   acetaminophen-codeine (TYLENOL #3) 300-30 MG tablet    Sig: Take 1 tablet by mouth every 6 (six) hours as needed for moderate pain.    Dispense:  30 tablet    Refill:  0      Procedures: No procedures performed   Clinical Data: No additional findings.  Objective: Vital Signs: There were no vitals taken for this visit.  Physical Exam:  Constitutional: Patient appears well-developed HEENT:  Head: Normocephalic Eyes:EOM are normal Neck: Normal range of motion Cardiovascular: Normal rate Pulmonary/chest: Effort normal Neurologic: Patient is alert Skin: Skin is warm Psychiatric: Patient has normal mood and affect  Ortho Exam: Ortho exam demonstrates fairly minimal groin pain on the right and left-hand side with internal/external rotation of the leg.  Knee has no real warmth or effusion on the right-hand side.  Range of motion is past 90 degrees of flexion with good extension and no effusion.  No mid flexion instability and very good collateral ligament stability at 0 90 degrees and 30 degrees.  Specialty Comments:  No specialty comments available.  Imaging: No results found.   PMFS History: Patient Active Problem List   Diagnosis Date Noted   Arthritis of right knee    S/P total knee arthroplasty, right 04/21/2021  Chest discomfort 07/28/2016   Controlled type 2 diabetes mellitus without complication ([place]) 0000000   Bilateral shoulder pain 10/31/2013   Erectile dysfunction 04/18/2013   Mixed hyperlipidemia 11/26/2009   LIMB PAIN 07/23/2009   GERD 04/29/2009   Obesity 10/22/2008   OBSTRUCTIVE SLEEP APNEA 10/22/2008   Essential hypertension 10/22/2008   CARDIOMYOPATHY 10/22/2008   CONGESTIVE HEART FAILURE, MILD 123456   Chronic systolic heart failure ([place]) 10/16/2008   Past Medical History:  Diagnosis Date   Arthritis    Cardiomyopathy    Non-ischemic   CHF (congestive heart failure) ([place])     Diabetes mellitus without complication (HCC)    HTN (hypertension)    Hyperlipidemia    Hypokalemia    Obesity    OSA (obstructive sleep apnea)    Sleep apnea    had surgery- no cpap   Stroke ([place]) 99991111   Systolic CHF ([place])     Family History  Problem Relation Age of Onset   Diabetes Mother    Hypertension Mother    Heart disease Mother    Cancer Father        Throat cancer   Stroke Father    Stroke Brother 40   Prostate cancer Neg Hx    Colon cancer Neg Hx    Colon polyps Neg Hx    Esophageal cancer Neg Hx    Rectal cancer Neg Hx    Stomach cancer Neg Hx     Past Surgical History:  Procedure Laterality Date   avulsion fracture of left hip     CARDIAC CATHETERIZATION  10/08/08   LEFT HEART CATH AND CORONARY ANGIOGRAPHY N/A 08/03/2016   Procedure: Left Heart Cath and Coronary Angiography;  Surgeon: Jolaine Artist, MD;  Location: [facility];  Service: Cardiovascular;  Laterality: N/A;   TONSILLECTOMY     TOTAL KNEE ARTHROPLASTY Right 04/21/2021   Procedure: RIGHT TOTAL KNEE ARTHROPLASTY;  Surgeon: Meredith Pel, MD;  Location: [facility];  Service: Orthopedics;  Laterality: Right;   UVULOPALATOPHARYNGOPLASTY     Social History   Occupational History    Employer: UNEMPLOYED  Tobacco Use   Smoking status: Some Days    Types: Cigars   Smokeless tobacco: Never  Vaping Use   Vaping Use: Never used  Substance and Sexual Activity   Alcohol use: No   Drug use: No   Sexual activity: Not on file

## 2022-07-28 ENCOUNTER — Ambulatory Visit (INDEPENDENT_AMBULATORY_CARE_PROVIDER_SITE_OTHER): Payer: 59 | Admitting: Adult Health

## 2022-07-28 ENCOUNTER — Encounter: Payer: Self-pay | Admitting: Adult Health

## 2022-07-28 VITALS — BP 150/100 | HR 94 | Temp 98.3°F | Ht 70.0 in | Wt 241.0 lb

## 2022-07-28 DIAGNOSIS — I5022 Chronic systolic (congestive) heart failure: Secondary | ICD-10-CM

## 2022-07-28 DIAGNOSIS — E1169 Type 2 diabetes mellitus with other specified complication: Secondary | ICD-10-CM

## 2022-07-28 DIAGNOSIS — Z1159 Encounter for screening for other viral diseases: Secondary | ICD-10-CM

## 2022-07-28 DIAGNOSIS — R6882 Decreased libido: Secondary | ICD-10-CM | POA: Diagnosis not present

## 2022-07-28 DIAGNOSIS — Z113 Encounter for screening for infections with a predominantly sexual mode of transmission: Secondary | ICD-10-CM

## 2022-07-28 DIAGNOSIS — Z1211 Encounter for screening for malignant neoplasm of colon: Secondary | ICD-10-CM

## 2022-07-28 DIAGNOSIS — Z7984 Long term (current) use of oral hypoglycemic drugs: Secondary | ICD-10-CM | POA: Diagnosis not present

## 2022-07-28 DIAGNOSIS — I10 Essential (primary) hypertension: Secondary | ICD-10-CM

## 2022-07-28 LAB — URINALYSIS
Bilirubin Urine: NEGATIVE
Hgb urine dipstick: NEGATIVE
Ketones, ur: NEGATIVE
Leukocytes,Ua: NEGATIVE
Nitrite: NEGATIVE
Specific Gravity, Urine: 1.025 (ref 1.000–1.030)
Total Protein, Urine: NEGATIVE
Urine Glucose: 100 — AB
Urobilinogen, UA: 1 (ref 0.0–1.0)
pH: 6 (ref 5.0–8.0)

## 2022-07-28 LAB — MICROALBUMIN / CREATININE URINE RATIO
Creatinine,U: 100.3 mg/dL
Microalb Creat Ratio: 2 mg/g (ref 0.0–30.0)
Microalb, Ur: 2 mg/dL — ABNORMAL HIGH (ref 0.0–1.9)

## 2022-07-28 LAB — BASIC METABOLIC PANEL
BUN: 11 mg/dL (ref 6–23)
CO2: 22 mEq/L (ref 19–32)
Calcium: 9.1 mg/dL (ref 8.4–10.5)
Chloride: 102 mEq/L (ref 96–112)
Creatinine, Ser: 0.89 mg/dL (ref 0.40–1.50)
GFR: 95.63 mL/min (ref >60.00)
Glucose, Bld: 190 mg/dL — ABNORMAL HIGH (ref 70–99)
Potassium: 3.9 mEq/L (ref 3.5–5.1)
Sodium: 135 mEq/L (ref 135–145)

## 2022-07-28 LAB — POCT GLYCOSYLATED HEMOGLOBIN (HGB A1C): Hemoglobin A1C: 7.2 % — AB (ref 4.0–5.6)

## 2022-07-28 LAB — TESTOSTERONE: Testosterone: 203.47 ng/dL — ABNORMAL LOW (ref 300.00–890.00)

## 2022-07-28 MED ORDER — METFORMIN HCL 500 MG PO TABS: 500.0000 mg | ORAL_TABLET | Freq: Two times a day (BID) | ORAL | 0 refills | Status: DC

## 2022-07-28 MED ORDER — HYDRALAZINE HCL 50 MG PO TABS: 50.0000 mg | ORAL_TABLET | Freq: Three times a day (TID) | ORAL | 1 refills | Status: DC

## 2022-07-28 NOTE — Patient Instructions (Signed)
Health Maintenance Due  Topic Date Due   Diabetic kidney evaluation - Urine ACR  Never done   Hepatitis C Screening  Never done   COLONOSCOPY (Pts 45-53yrs Insurance coverage will need to be confirmed)  Never done   OPHTHALMOLOGY EXAM  12/13/2014   Zoster Vaccines- Shingrix (1 of 2) Never done   FOOT EXAM  03/26/2022      Row Labels 04/16/2022    1:56 PM 03/01/2022    3:46 PM 03/26/2021    3:09 PM  Depression screen PHQ 2/9   Section Header. No data exists in this row.     Decreased Interest   0 0 0  Down, Depressed, Hopeless   0 0 0  PHQ - 2 Score   0 0 0  Altered sleeping   0    Tired, decreased energy   0    Change in appetite   0    Feeling bad or failure about yourself    0    Trouble concentrating   0    Moving slowly or fidgety/restless   0    Suicidal thoughts   0    PHQ-9 Score   0    Difficult doing work/chores   Not difficult at all

## 2022-07-28 NOTE — Progress Notes (Addendum)
Subjective:    Patient ID: Caleb Garcia, male    DOB: 1965-12-09, 57 y.o.   MRN: [id number]  HPI 57 year old male who  has a past medical history of Arthritis, Cardiomyopathy, CHF (congestive heart failure), Diabetes mellitus without complication, HTN (hypertension), Hyperlipidemia, Hypokalemia, Obesity, OSA (obstructive sleep apnea), Sleep apnea, Stroke (10/2008), and Systolic CHF.  He presents to the office today for follow up regarding DM and HTN   Diabetes mellitus type 2-managed with metformin 500 mg twice daily and glipizide 10 mg XR.  He does not monitor his blood sugars at home on a routine basis.  Denies episodes of hypoglycemia. He does stay active at work  Lab Results  Component Value Date   HGBA1C 7.2 (A) 07/28/2022   HTN -managed with lisinopril 40 mg daily, hydralazine 25 mg 3 times daily, Plendil 2.5 mg daily, Lasix 40 mg daily, and Coreg 25 mg daily.  He denies dizziness, lightheadedness, chest pain, or shortness of breath but has started to experiencing mild headaches from time to time.Marland Kitchen  His last echo in 2016 showed an EF of 45 to 50%.  RV was normal. He took his medication just prior to arrival. He has not been checking at home. a BP Readings from Last 3 Encounters:  07/28/22 (!) 150/100  04/16/22 (!) 140/100  04/23/21 (!) 148/81   Additionally he reports that his sex drive has crashed recently and has some issues with ED periodically.  He would also like to be checked for STDs, he had unprotected sex about 3 weeks ago. Soon after he developed a burning and tingling around his penis but has not noticed any discharge.   Review of Systems See HPI   Past Medical History:  Diagnosis Date   Arthritis    Cardiomyopathy    Non-ischemic   CHF (congestive heart failure)    Diabetes mellitus without complication    HTN (hypertension)    Hyperlipidemia    Hypokalemia    Obesity    OSA (obstructive sleep apnea)    Sleep apnea    had surgery- no cpap   Stroke  10/2008   Systolic CHF     Social History   Socioeconomic History   Marital status: Married    Spouse name: Not on file   Number of children: Not on file   Years of education: Not on file   Highest education level: Not on file  Occupational History    Employer: UNEMPLOYED  Tobacco Use   Smoking status: Some Days    Types: Cigars   Smokeless tobacco: Never  Vaping Use   Vaping Use: Never used  Substance and Sexual Activity   Alcohol use: No   Drug use: No   Sexual activity: Not on file  Other Topics Concern   Not on file  Social History Narrative   Married 6 years   2 biologic daughters   2 stepsons, one Programme researcher, broadcasting/film/video for [employer]    Social Determinants of Health   Financial Resource Strain: Not on file  Food Insecurity: No Food Insecurity (03/01/2022)   Hunger Vital Sign    Worried About Running Out of Food in the Last Year: Never true    Ran Out of Food in the Last Year: Never true  Transportation Needs: No Transportation Needs (03/08/2022)   PRAPARE - Administrator, Civil Service (Medical): No    Lack of Transportation (Non-Medical): No  Physical Activity: Not on file  Stress: Not on file  Social Connections: Not on file  Intimate Partner Violence: Not on file    Past Surgical History:  Procedure Laterality Date   avulsion fracture of left hip     CARDIAC CATHETERIZATION  10/08/08   LEFT HEART CATH AND CORONARY ANGIOGRAPHY N/A 08/03/2016   Procedure: Left Heart Cath and Coronary Angiography;  Surgeon: Dolores Patty, MD;  Location: [facility];  Service: Cardiovascular;  Laterality: N/A;   TONSILLECTOMY     TOTAL KNEE ARTHROPLASTY Right 04/21/2021   Procedure: RIGHT TOTAL KNEE ARTHROPLASTY;  Surgeon: Cammy Copa, MD;  Location: [facility];  Service: Orthopedics;  Laterality: Right;   UVULOPALATOPHARYNGOPLASTY      Family History  Problem Relation Age of Onset   Diabetes Mother    Hypertension Mother    Heart disease Mother     Cancer Father        Throat cancer   Stroke Father    Stroke Brother 60   Prostate cancer Neg Hx    Colon cancer Neg Hx    Colon polyps Neg Hx    Esophageal cancer Neg Hx    Rectal cancer Neg Hx    Stomach cancer Neg Hx     Allergies  Allergen Reactions   Other     Almonds-caused swelling in hands   Shrimp [Shellfish Allergy] Swelling    Current Outpatient Medications on File Prior to Visit  Medication Sig Dispense Refill   acetaminophen (TYLENOL) 500 MG tablet Take 500 mg by mouth every 6 (six) hours as needed for mild pain.     acetaminophen-codeine (TYLENOL #3) 300-30 MG tablet 1 po q 8-12 hrs prn pain 30 tablet 0   acetaminophen-codeine (TYLENOL #3) 300-30 MG tablet Take 1 tablet by mouth every 6 (six) hours as needed for moderate pain. 30 tablet 0   aspirin 81 MG tablet Take 81 mg by mouth daily.       atorvastatin (LIPITOR) 20 MG tablet Take 1 tablet (20 mg total) by mouth daily. 90 tablet 3   carvedilol (COREG) 25 MG tablet Take 1 tablet (25 mg total) by mouth 2 (two) times daily with a meal. 180 tablet 3   Chlorpheniramine-DM (CORICIDIN HBP COUGH/COLD PO) Take 1 tablet by mouth every 12 (twelve) hours as needed (cold symptoms).     felodipine (PLENDIL) 2.5 MG 24 hr tablet Take 1 tablet (2.5 mg total) by mouth daily. 90 tablet 3   furosemide (LASIX) 40 MG tablet Take 1 tablet (40 mg total) by mouth daily. 90 tablet 3   glipiZIDE (GLUCOTROL XL) 10 MG 24 hr tablet Take 1 tablet (10 mg total) by mouth daily with breakfast. 90 tablet 2   lisinopril (ZESTRIL) 40 MG tablet Take 1 tablet (40 mg total) by mouth daily. 90 tablet 3   meloxicam (MOBIC) 15 MG tablet Take 1 tablet (15 mg total) by mouth daily with breakfast. 30 tablet 1   potassium chloride (KLOR-CON M) 10 MEQ tablet Take 1 tablet by mouth once daily 30 tablet 0   spironolactone (ALDACTONE) 25 MG tablet Take 2 tablets (50 mg total) by mouth daily. 180 tablet 3   tadalafil (CIALIS) 20 MG tablet Take 20 mg by mouth  daily as needed.     No current facility-administered medications on file prior to visit.    BP (!) 150/100   Pulse 94   Temp 98.3 F (36.8 C) (Oral)   Ht 5\' 10"  (1.778 m)   Wt 241  lb (109.3 kg)   SpO2 98%   BMI 34.58 kg/m       Objective:   Physical Exam Vitals and nursing note reviewed.  Constitutional:      Appearance: Normal appearance.  Cardiovascular:     Rate and Rhythm: Normal rate and regular rhythm.     Pulses: Normal pulses.     Heart sounds: Normal heart sounds.  Pulmonary:     Effort: Pulmonary effort is normal.     Breath sounds: Normal breath sounds.  Skin:    General: Skin is warm and dry.  Neurological:     General: No focal deficit present.     Mental Status: He is alert and oriented to person, place, and time.  Psychiatric:        Mood and Affect: Mood normal.        Behavior: Behavior normal.        Thought Content: Thought content normal.        Judgment: Judgment normal.       Assessment & Plan:   1. Type 2 diabetes mellitus with other specified complication, without long-term current use of insulin  - POC HgB A1c- has improved to 7.2  - Continue with current regimen of metformin and glipizide - He has been able to lose about 17 pounds.   - Microalbumin / creatinine urine ratio; Future - Basic Metabolic Panel; Future - metFORMIN (GLUCOPHAGE) 500 MG tablet; Take 1 tablet (500 mg total) by mouth 2 (two) times daily with a meal.  Dispense: 180 tablet; Refill: 0  2. Essential hypertension - Will increase Hydralazine to 50 mg TID - He has an appointment with Cardiology coming up in a few weeks - Need him to monitor his BP at home  - Basic Metabolic Panel; Future - hydrALAZINE (APRESOLINE) 50 MG tablet; Take 1 tablet (50 mg total) by mouth 3 (three) times daily.  Dispense: 270 tablet; Refill: 1  3. Colon cancer screening  - Ambulatory referral to Gastroenterology  4. Low libido  - Testosterone; Future  5. Screening examination for  STD (sexually transmitted disease) - Safe sex practices reviewed  - HIV Antibody (routine testing w rflx); Future - RPR; Future - Urine cytology ancillary only - Urinalysis; Future   Shirline Frees, NP

## 2022-07-29 ENCOUNTER — Other Ambulatory Visit: Payer: Self-pay | Admitting: Adult Health

## 2022-07-29 DIAGNOSIS — I5022 Chronic systolic (congestive) heart failure: Secondary | ICD-10-CM

## 2022-07-29 DIAGNOSIS — I10 Essential (primary) hypertension: Secondary | ICD-10-CM

## 2022-07-29 LAB — RPR: RPR Ser Ql: NONREACTIVE

## 2022-07-29 LAB — HEPATITIS C ANTIBODY: Hepatitis C Ab: NONREACTIVE

## 2022-07-29 LAB — HIV ANTIBODY (ROUTINE TESTING W REFLEX): HIV 1&2 Ab, 4th Generation: NONREACTIVE

## 2022-08-12 ENCOUNTER — Encounter (HOSPITAL_COMMUNITY): Payer: Self-pay | Admitting: Internal Medicine

## 2022-08-12 ENCOUNTER — Other Ambulatory Visit (HOSPITAL_COMMUNITY): Payer: Self-pay

## 2022-08-12 ENCOUNTER — Ambulatory Visit (HOSPITAL_COMMUNITY)
Admission: RE | Admit: 2022-08-12 | Discharge: 2022-08-12 | Disposition: A | Discharge status: Home / Self Care | Payer: 59 | Source: Ambulatory Visit | Attending: Internal Medicine | Admitting: Internal Medicine

## 2022-08-12 VITALS — BP 110/70 | HR 85 | Wt 245.0 lb

## 2022-08-12 DIAGNOSIS — I5022 Chronic systolic (congestive) heart failure: Secondary | ICD-10-CM | POA: Diagnosis not present

## 2022-08-12 DIAGNOSIS — I428 Other cardiomyopathies: Secondary | ICD-10-CM | POA: Diagnosis not present

## 2022-08-12 DIAGNOSIS — E669 Obesity, unspecified: Secondary | ICD-10-CM | POA: Insufficient documentation

## 2022-08-12 DIAGNOSIS — Z79899 Other long term (current) drug therapy: Secondary | ICD-10-CM | POA: Insufficient documentation

## 2022-08-12 DIAGNOSIS — G4733 Obstructive sleep apnea (adult) (pediatric): Secondary | ICD-10-CM | POA: Insufficient documentation

## 2022-08-12 DIAGNOSIS — I10 Essential (primary) hypertension: Secondary | ICD-10-CM

## 2022-08-12 DIAGNOSIS — I110 Hypertensive heart disease with heart failure: Secondary | ICD-10-CM | POA: Insufficient documentation

## 2022-08-12 LAB — CBC
HCT: 40.8 % (ref 39.0–52.0)
Hemoglobin: 14 g/dL (ref 13.0–17.0)
MCH: 31 pg (ref 26.0–34.0)
MCHC: 34.3 g/dL (ref 30.0–36.0)
MCV: 90.5 fL (ref 80.0–100.0)
Platelets: 242 10*3/uL (ref 150–400)
RBC: 4.51 MIL/uL (ref 4.22–5.81)
RDW: 13.2 % (ref 11.5–15.5)
WBC: 5.8 10*3/uL (ref 4.0–10.5)
nRBC: 0 % (ref 0.0–0.2)

## 2022-08-12 LAB — BASIC METABOLIC PANEL
Anion gap: 7 (ref 5–15)
BUN: 11 mg/dL (ref 6–20)
CO2: 25 mmol/L (ref 22–32)
Calcium: 9.3 mg/dL (ref 8.9–10.3)
Chloride: 103 mmol/L (ref 98–111)
Creatinine, Ser: 1.01 mg/dL (ref 0.61–1.24)
GFR, Estimated: >60 mL/min (ref >60)
Glucose, Bld: 88 mg/dL (ref 70–99)
Potassium: 3.7 mmol/L (ref 3.5–5.1)
Sodium: 135 mmol/L (ref 135–145)

## 2022-08-12 LAB — BRAIN NATRIURETIC PEPTIDE: B Natriuretic Peptide: 42.5 pg/mL (ref 0.0–100.0)

## 2022-08-12 MED ORDER — FUROSEMIDE 40 MG PO TABS: 40.0000 mg | ORAL_TABLET | ORAL | 0 refills | Status: DC | PRN

## 2022-08-12 MED ORDER — EMPAGLIFLOZIN 10 MG PO TABS: 10.0000 mg | ORAL_TABLET | Freq: Every day | ORAL | 3 refills | Status: DC

## 2022-08-12 NOTE — Patient Instructions (Addendum)
CHANGE Lasix to as needed for weight gain of 3lb in 24 hours, 5lb in a week, shortness of breath or edema.  START Jardiance 10 mg daily   Labs done today, your results will be available in MyChart, we will contact you for abnormal readings.  Your physician has requested that you have an echocardiogram. Echocardiography is a painless test that uses sound waves to create images of your heart. It provides your doctor with information about the size and shape of your heart and how well your heart's chambers and valves are working. This procedure takes approximately one hour. There are no restrictions for this procedure. Please do NOT wear cologne, perfume, aftershave, or lotions (deodorant is allowed). Please arrive 15 minutes prior to your appointment time.  Your physician recommends that you schedule a follow-up appointment in: 4 months.  If you have any questions or concerns before your next appointment please send [place] a message through [employer] or call our office at [phone].    TO LEAVE A MESSAGE FOR THE NURSE SELECT OPTION 2, PLEASE LEAVE A MESSAGE INCLUDING: YOUR NAME DATE OF BIRTH CALL BACK NUMBER REASON FOR CALL**this is important as we prioritize the call backs  YOU WILL RECEIVE A CALL BACK THE SAME DAY AS LONG AS YOU CALL BEFORE 4:00 PM  At the [facility], you and your health needs are our priority. As part of our continuing mission to provide you with exceptional heart care, we have created designated Provider Care Teams. These Care Teams include your primary Cardiologist (physician) and Advanced Practice Providers (APPs- Physician Assistants and Nurse Practitioners) who all work together to provide you with the care you need, when you need it.   You may see any of the following providers on your designated Care Team at your next follow up: Dr Arvilla Meres Dr Marca Ancona Dr. Marcos Eke, NP Robbie Lis, [place] [facility] [place], [place] Brynda Peon, NP Karle Plumber, PharmD   Please be sure to bring in all your medications bottles to every appointment.    Thank you for choosing [facility]

## 2022-08-12 NOTE — Progress Notes (Signed)
ADVANCED HF CLINIC CONSULT NOTE  Referring Physician: Shirline Frees, NP Primary Care: Shirline Frees, NP Primary Cardiologist: None  HPI:  Caleb Garcia is a 57 y.o. male with h/o HTN, obesity, OSA s/p UPPP, ? Familial NICM cardiomyopathy (suspect hypertensive), previous occipital stroke, and chronic systolic HF LVEF 11-91% by Echo 06/2014. We last saw him in 4/18 and he is referred back by Shirline Frees for f/u on his HF.   Cardiac catheterization in 7/10 EF 25-30% no CAD. Previously told he had very mild OSA with AHI 8. Saw Dr. Shelle Iron and decided to keep off CPAP for the time being.    Was in [facility], [place] and moved back about 1 year ago. Has not been followed by Cardiology. Still working in [employer]. Feels ok. No CP or SOB. No edema, orthopnea or PND.    ECHO 10/2008 25-30%  ECHO 02/2009 EF 35-40%  ECHO 04/2013 EF 35-40% ECHO 06/18/2014: EF 45-50%      SH: Lives at home with wife. Has alcohol on occasion. No drug use. No smoking FH: Brother has HTN        Mom kidney transplant Dad throat cancer   Review of Systems: [y] = yes, [ ]  = no   General: Weight gain [ ] ; Weight loss [ ] ; Anorexia [ ] ; Fatigue [ ] ; Fever [ ] ; Chills [ ] ; Weakness [ ]   Cardiac: Chest pain/pressure [ ] ; Resting SOB [ ] ; Exertional SOB [ ] ; Orthopnea [ ] ; Pedal Edema [ ] ; Palpitations [ ] ; Syncope [ ] ; Presyncope [ ] ; Paroxysmal nocturnal dyspnea[ ]   Pulmonary: Cough [ ] ; Wheezing[ ] ; Hemoptysis[ ] ; Sputum [ ] ; Snoring [place] ]  GI: Vomiting[ ] ; Dysphagia[ ] ; Melena[ ] ; Hematochezia [ ] ; Heartburn[ ] ; Abdominal pain [ ] ; Constipation [ ] ; Diarrhea [ ] ; BRBPR [ ]   GU: Hematuria[ ] ; Dysuria [ ] ; Nocturia[ ]   Vascular: Pain in legs with walking [ ] ; Pain in feet with lying flat [ ] ; Non-healing sores [ ] ; Stroke [ ] ; TIA [ ] ; Slurred speech [ ] ;  Neuro: Headaches[ ] ; Vertigo[ ] ; Seizures[ ] ; Paresthesias[ ] ;Blurred vision [ ] ; Diplopia [ ] ; Vision changes [ ]   Ortho/Skin: Arthritis [ y]; Joint  pain [ y]; Muscle pain [ ] ; Joint swelling [ ] ; Back Pain [ ] ; Rash [ ]   Psych: Depression[ ] ; Anxiety[ ]   Heme: Bleeding problems [ ] ; Clotting disorders [ ] ; Anemia [ ]   Endocrine: Diabetes [ ] ; Thyroid dysfunction[ ]    Past Medical History:  Diagnosis Date   Arthritis    Cardiomyopathy    Non-ischemic   CHF (congestive heart failure) (HCC)    Diabetes mellitus without complication (HCC)    HTN (hypertension)    Hyperlipidemia    Hypokalemia    Obesity    OSA (obstructive sleep apnea)    Sleep apnea    had surgery- no cpap   Stroke (HCC) 10/2008   Systolic CHF (HCC)     Current Outpatient Medications  Medication Sig Dispense Refill   acetaminophen (TYLENOL) 500 MG tablet Take 500 mg by mouth every 6 (six) hours as needed for mild pain.     acetaminophen-codeine (TYLENOL #3) 300-30 MG tablet Take 1 tablet by mouth every 6 (six) hours as needed for moderate pain. 30 tablet 0   aspirin 81 MG tablet Take 81 mg by mouth daily.       atorvastatin (LIPITOR) 20 MG tablet Take 1 tablet (20 mg total) by mouth daily.  90 tablet 3   carvedilol (COREG) 25 MG tablet Take 1 tablet (25 mg total) by mouth 2 (two) times daily with a meal. 180 tablet 3   Chlorpheniramine-DM (CORICIDIN HBP COUGH/COLD PO) Take 1 tablet by mouth every 12 (twelve) hours as needed (cold symptoms).     felodipine (PLENDIL) 2.5 MG 24 hr tablet Take 1 tablet (2.5 mg total) by mouth daily. 90 tablet 3   furosemide (LASIX) 40 MG tablet Take 1 tablet by mouth once daily 30 tablet 0   glipiZIDE (GLUCOTROL XL) 10 MG 24 hr tablet Take 1 tablet (10 mg total) by mouth daily with breakfast. 90 tablet 2   hydrALAZINE (APRESOLINE) 50 MG tablet Take 1 tablet (50 mg total) by mouth 3 (three) times daily. 270 tablet 1   lisinopril (ZESTRIL) 40 MG tablet Take 1 tablet (40 mg total) by mouth daily. 90 tablet 3   meloxicam (MOBIC) 15 MG tablet Take 1 tablet (15 mg total) by mouth daily with breakfast. 30 tablet 1   metFORMIN (GLUCOPHAGE)  500 MG tablet Take 1 tablet (500 mg total) by mouth 2 (two) times daily with a meal. 180 tablet 0   potassium chloride (KLOR-CON M) 10 MEQ tablet Take 1 tablet by mouth once daily 30 tablet 0   spironolactone (ALDACTONE) 25 MG tablet Take 2 tablets (50 mg total) by mouth daily. 180 tablet 3   tadalafil (CIALIS) 20 MG tablet Take 20 mg by mouth daily as needed.     No current facility-administered medications for this encounter.    Allergies  Allergen Reactions   Other     Almonds-caused swelling in hands   Shrimp [Shellfish Allergy] Swelling      Social History   Socioeconomic History   Marital status: Married    Spouse name: Not on file   Number of children: Not on file   Years of education: Not on file   Highest education level: Not on file  Occupational History    Employer: UNEMPLOYED  Tobacco Use   Smoking status: Some Days    Types: Cigars   Smokeless tobacco: Never  Vaping Use   Vaping Use: Never used  Substance and Sexual Activity   Alcohol use: No   Drug use: No   Sexual activity: Not on file  Other Topics Concern   Not on file  Social History Narrative   Married 6 years   2 biologic daughters   2 stepsons, one Programme researcher, broadcasting/film/video for [employer]    Social Determinants of Health   Financial Resource Strain: Not on file  Food Insecurity: No Food Insecurity (03/01/2022)   Hunger Vital Sign    Worried About Running Out of Food in the Last Year: Never true    Ran Out of Food in the Last Year: Never true  Transportation Needs: No Transportation Needs (03/08/2022)   PRAPARE - Administrator, Civil Service (Medical): No    Lack of Transportation (Non-Medical): No  Physical Activity: Not on file  Stress: Not on file  Social Connections: Not on file  Intimate Partner Violence: Not on file      Family History  Problem Relation Age of Onset   Diabetes Mother    Hypertension Mother    Heart disease Mother    Cancer Father        Throat cancer    Stroke Father    Stroke Brother 57   Prostate cancer Neg Hx    Colon cancer  Neg Hx    Colon polyps Neg Hx    Esophageal cancer Neg Hx    Rectal cancer Neg Hx    Stomach cancer Neg Hx     Vitals:   08/12/22 1359  BP: 110/70  Pulse: 85  SpO2: 98%  Weight: 111.1 kg (245 lb)    PHYSICAL EXAM: General:  Well appearing. No resp difficulty HEENT: normal Neck: supple. no JVD. Carotids 2+ bilat; no bruits. No lymphadenopathy or thryomegaly appreciated. Cor: PMI nondisplaced. Regular rate & rhythm. No rubs, gallops or murmurs. Lungs: clear Abdomen: obese soft, nontender, nondistended. No hepatosplenomegaly. No bruits or masses. Good bowel sounds. Extremities: no cyanosis, clubbing, rash, edema Neuro: alert & orientedx3, cranial nerves grossly intact. moves all 4 extremities w/o difficulty. Affect pleasant   ECG: NSR 84 diffuse TWI Personally reviewed   ASSESSMENT & PLAN:  1) Chronic systolic HF: NICM, - Cardiac catheterization on 7/10 EF 25-30% no CAD - Echo 06/2014 EF 45-50%, RV normal.  - POCUS today 08/12/22 EF 30-35%  - NYHA I  - Volume status stable on exam. Switch lasix to PRN - Add Jardiance 10 - Continue coreg 25 mg BID - Continue lisinopril 40 mg daily. - Continue hydralazine 25 mg TID - Continue spiro 50 mg daily - Check formal echo  - Will likely need cMRI 2) HTN - Blood pressure well controlled. Continue current regimen. 3) OSA  - Mild. Has not wanted CPAP 4) Obesity - consider [id number]  Arvilla Meres, MD  2:24 PM

## 2022-08-24 ENCOUNTER — Ambulatory Visit: Payer: 59 | Admitting: Adult Health

## 2022-08-24 VITALS — BP 130/82 | HR 76 | Temp 98.0°F | Ht 70.0 in | Wt 239.0 lb

## 2022-08-24 DIAGNOSIS — M25562 Pain in left knee: Secondary | ICD-10-CM

## 2022-08-24 DIAGNOSIS — R7989 Other specified abnormal findings of blood chemistry: Secondary | ICD-10-CM | POA: Diagnosis not present

## 2022-08-24 DIAGNOSIS — G8929 Other chronic pain: Secondary | ICD-10-CM | POA: Diagnosis not present

## 2022-08-24 NOTE — Progress Notes (Signed)
Subjective:    Patient ID: Caleb Garcia, male    DOB: 1965-04-30, 57 y.o.   MRN: [id number]  Knee Pain    57 year old male who  has a past medical history of Arthritis, Cardiomyopathy, CHF (congestive heart failure) (HCC), Diabetes mellitus without complication (HCC), HTN (hypertension), Hyperlipidemia, Hypokalemia, Obesity, OSA (obstructive sleep apnea), Sleep apnea, Stroke (HCC) (10/2008), and Systolic CHF (HCC).  He presents to the office today for chronic left knee pain. He has a history of of right total knee and feels like he needs to have his left knee done. He reports that the pain in his left knee has become worse and he feels like he cannot work this week. His right knee is starting to hurt.  He knows that he likely needs knee replacement surgery. He will contact Dr. August Saucer and try and get an appointment next week.  He would also like a a referral to Urology due do hypogonadism. He has fatigue and loss of libido   Lab Results  Component Value Date   TESTOSTERONE 203.47 (L) 07/28/2022      Review of Systems See HPI   Past Medical History:  Diagnosis Date   Arthritis    Cardiomyopathy    Non-ischemic   CHF (congestive heart failure) (HCC)    Diabetes mellitus without complication (HCC)    HTN (hypertension)    Hyperlipidemia    Hypokalemia    Obesity    OSA (obstructive sleep apnea)    Sleep apnea    had surgery- no cpap   Stroke (HCC) 10/2008   Systolic CHF (HCC)     Social History   Socioeconomic History   Marital status: Married    Spouse name: Not on file   Number of children: Not on file   Years of education: Not on file   Highest education level: Not on file  Occupational History    Employer: UNEMPLOYED  Tobacco Use   Smoking status: Some Days    Types: Cigars   Smokeless tobacco: Never  Vaping Use   Vaping Use: Never used  Substance and Sexual Activity   Alcohol use: No   Drug use: No   Sexual activity: Not on file  Other Topics Concern    Not on file  Social History Narrative   Married 6 years   2 biologic daughters   2 stepsons, one Programme researcher, broadcasting/film/video for [employer]    Social Determinants of Health   Financial Resource Strain: Not on file  Food Insecurity: No Food Insecurity (03/01/2022)   Hunger Vital Sign    Worried About Running Out of Food in the Last Year: Never true    Ran Out of Food in the Last Year: Never true  Transportation Needs: No Transportation Needs (03/08/2022)   PRAPARE - Administrator, Civil Service (Medical): No    Lack of Transportation (Non-Medical): No  Physical Activity: Not on file  Stress: Not on file  Social Connections: Not on file  Intimate Partner Violence: Not on file    Past Surgical History:  Procedure Laterality Date   avulsion fracture of left hip     CARDIAC CATHETERIZATION  10/08/08   LEFT HEART CATH AND CORONARY ANGIOGRAPHY N/A 08/03/2016   Procedure: Left Heart Cath and Coronary Angiography;  Surgeon: Dolores Patty, MD;  Location: [facility] INVASIVE CV LAB;  Service: Cardiovascular;  Laterality: N/A;   TONSILLECTOMY     TOTAL KNEE ARTHROPLASTY Right 04/21/2021  Procedure: RIGHT TOTAL KNEE ARTHROPLASTY;  Surgeon: Cammy Copa, MD;  Location: [facility] OR;  Service: Orthopedics;  Laterality: Right;   UVULOPALATOPHARYNGOPLASTY      Family History  Problem Relation Age of Onset   Diabetes Mother    Hypertension Mother    Heart disease Mother    Cancer Father        Throat cancer   Stroke Father    Stroke Brother 97   Prostate cancer Neg Hx    Colon cancer Neg Hx    Colon polyps Neg Hx    Esophageal cancer Neg Hx    Rectal cancer Neg Hx    Stomach cancer Neg Hx     Allergies  Allergen Reactions   Other     Almonds-caused swelling in hands   Shrimp [Shellfish Allergy] Swelling    Current Outpatient Medications on File Prior to Visit  Medication Sig Dispense Refill   acetaminophen (TYLENOL) 500 MG tablet Take 500 mg by mouth every 6 (six) hours as  needed for mild pain.     acetaminophen-codeine (TYLENOL #3) 300-30 MG tablet Take 1 tablet by mouth every 6 (six) hours as needed for moderate pain. 30 tablet 0   aspirin 81 MG tablet Take 81 mg by mouth daily.       atorvastatin (LIPITOR) 20 MG tablet Take 1 tablet (20 mg total) by mouth daily. 90 tablet 3   carvedilol (COREG) 25 MG tablet Take 1 tablet (25 mg total) by mouth 2 (two) times daily with a meal. 180 tablet 3   Chlorpheniramine-DM (CORICIDIN HBP COUGH/COLD PO) Take 1 tablet by mouth every 12 (twelve) hours as needed (cold symptoms).     empagliflozin (JARDIANCE) 10 MG TABS tablet Take 1 tablet (10 mg total) by mouth daily before breakfast. 90 tablet 3   felodipine (PLENDIL) 2.5 MG 24 hr tablet Take 1 tablet (2.5 mg total) by mouth daily. 90 tablet 3   furosemide (LASIX) 40 MG tablet Take 1 tablet (40 mg total) by mouth as needed. Gain of 3lb in 24 hours or 5lb in a week 30 tablet 0   glipiZIDE (GLUCOTROL XL) 10 MG 24 hr tablet Take 1 tablet (10 mg total) by mouth daily with breakfast. 90 tablet 2   hydrALAZINE (APRESOLINE) 50 MG tablet Take 1 tablet (50 mg total) by mouth 3 (three) times daily. 270 tablet 1   lisinopril (ZESTRIL) 40 MG tablet Take 1 tablet (40 mg total) by mouth daily. 90 tablet 3   meloxicam (MOBIC) 15 MG tablet Take 1 tablet (15 mg total) by mouth daily with breakfast. 30 tablet 1   metFORMIN (GLUCOPHAGE) 500 MG tablet Take 1 tablet (500 mg total) by mouth 2 (two) times daily with a meal. 180 tablet 0   potassium chloride (KLOR-CON M) 10 MEQ tablet Take 1 tablet by mouth once daily 30 tablet 0   spironolactone (ALDACTONE) 25 MG tablet Take 2 tablets (50 mg total) by mouth daily. 180 tablet 3   tadalafil (CIALIS) 20 MG tablet Take 20 mg by mouth daily as needed.     No current facility-administered medications on file prior to visit.    BP 130/82   Pulse 76   Temp 98 F (36.7 C) (Oral)   Ht 5\' 10"  (1.778 m)   Wt 239 lb (108.4 kg)   SpO2 97%   BMI 34.29  kg/m       Objective:   Physical Exam Vitals and nursing note reviewed.  Constitutional:  Appearance: Normal appearance.  Cardiovascular:     Pulses: Normal pulses.     Heart sounds: Normal heart sounds.  Pulmonary:     Effort: Pulmonary effort is normal.     Breath sounds: Normal breath sounds.  Musculoskeletal:        General: Tenderness present. Normal range of motion.  Skin:    General: Skin is warm and dry.  Neurological:     General: No focal deficit present.     Mental Status: He is alert and oriented to person, place, and time.  Psychiatric:        Mood and Affect: Mood normal.        Behavior: Behavior normal.        Thought Content: Thought content normal.        Judgment: Judgment normal.        Assessment & Plan:  1. Chronic pain of left knee -Work note given  - Advised follow up with Dr. August Saucer   2. Low testosterone  - Ambulatory referral to Urology  Time spent with patient today was 32 minutes which consisted of chart review, discussing, knee pain and low testosterone,  work up, treatment answering questions and documentation.

## 2022-08-28 ENCOUNTER — Other Ambulatory Visit: Payer: Self-pay | Admitting: Adult Health

## 2022-08-28 DIAGNOSIS — I10 Essential (primary) hypertension: Secondary | ICD-10-CM

## 2022-09-01 ENCOUNTER — Ambulatory Visit (HOSPITAL_COMMUNITY)
Admission: RE | Admit: 2022-09-01 | Discharge: 2022-09-01 | Disposition: A | Discharge status: Home / Self Care | Payer: 59 | Source: Ambulatory Visit | Attending: Adult Health | Admitting: Adult Health

## 2022-09-01 DIAGNOSIS — I110 Hypertensive heart disease with heart failure: Secondary | ICD-10-CM | POA: Insufficient documentation

## 2022-09-01 DIAGNOSIS — I5022 Chronic systolic (congestive) heart failure: Secondary | ICD-10-CM | POA: Diagnosis not present

## 2022-09-01 DIAGNOSIS — E785 Hyperlipidemia, unspecified: Secondary | ICD-10-CM | POA: Diagnosis not present

## 2022-09-01 DIAGNOSIS — G4730 Sleep apnea, unspecified: Secondary | ICD-10-CM | POA: Diagnosis not present

## 2022-09-01 DIAGNOSIS — I509 Heart failure, unspecified: Secondary | ICD-10-CM | POA: Insufficient documentation

## 2022-09-01 DIAGNOSIS — E119 Type 2 diabetes mellitus without complications: Secondary | ICD-10-CM | POA: Insufficient documentation

## 2022-09-01 LAB — ECHOCARDIOGRAM COMPLETE
Area-P 1/2: 4.15 cm2
Calc EF: 38.7 %
S' Lateral: 4.7 cm
Single Plane A2C EF: 40.9 %
Single Plane A4C EF: 38.5 %

## 2022-09-01 NOTE — Progress Notes (Signed)
Echocardiogram 2D Echocardiogram has been performed.  Caleb Garcia 09/01/2022, 2:30 PM

## 2022-09-09 ENCOUNTER — Other Ambulatory Visit: Payer: Self-pay | Admitting: Adult Health

## 2022-09-09 DIAGNOSIS — I5022 Chronic systolic (congestive) heart failure: Secondary | ICD-10-CM

## 2022-09-09 DIAGNOSIS — I10 Essential (primary) hypertension: Secondary | ICD-10-CM

## 2022-10-27 ENCOUNTER — Ambulatory Visit: Payer: 59 | Admitting: Adult Health

## 2022-10-27 NOTE — Progress Notes (Deleted)
Subjective:    Patient ID: Caleb Garcia, male    DOB: [birth year], 57 y.o.   MRN: [id number]  HPI 57 year old male who  has a past medical history of Arthritis, Cardiomyopathy, CHF (congestive heart failure) (HCC), Diabetes mellitus without complication (HCC), HTN (hypertension), Hyperlipidemia, Hypokalemia, Obesity, OSA (obstructive sleep apnea), Sleep apnea, Stroke (HCC) (10/2008), and Systolic CHF (HCC).  He presents to the office today for follow up regarding DM and HTN   Diabetes mellitus type 2-managed with metformin 500 mg twice daily,  glipizide 10 mg XR and Jardiance 10 mg daily.   He does not monitor his blood sugars at home on a routine basis.  Denies episodes of hypoglycemia. He does stay active at work  Lab Results  Component Value Date   HGBA1C 7.2 (A) 07/28/2022    HTN -managed with lisinopril 40 mg daily, hydralazine 25 mg 3 times daily, Plendil 2.5 mg daily, Lasix 40 mg daily, and Coreg 25 mg daily.  He denies dizziness, lightheadedness, chest pain, or shortness of breath but has started to experiencing mild headaches from time to time.Marland Kitchen  His last echo in 2016 showed an EF of 45 to 50%.  RV was normal. He took his medication just prior to arrival. He has not been checking at home. a BP Readings from Last 3 Encounters:  08/24/22 130/82  08/12/22 110/70  07/28/22 (!) 150/100    Review of Systems See HPI   Past Medical History:  Diagnosis Date   Arthritis    Cardiomyopathy    Non-ischemic   CHF (congestive heart failure) (HCC)    Diabetes mellitus without complication (HCC)    HTN (hypertension)    Hyperlipidemia    Hypokalemia    Obesity    OSA (obstructive sleep apnea)    Sleep apnea    had surgery- no cpap   Stroke (HCC) 10/2008   Systolic CHF (HCC)     Social History   Socioeconomic History   Marital status: Married    Spouse name: Not on file   Number of children: Not on file   Years of education: Not on file   Highest education level: Not  on file  Occupational History    Employer: UNEMPLOYED  Tobacco Use   Smoking status: Some Days    Types: Cigars   Smokeless tobacco: Never  Vaping Use   Vaping status: Never Used  Substance and Sexual Activity   Alcohol use: No   Drug use: No   Sexual activity: Not on file  Other Topics Concern   Not on file  Social History Narrative   Married 6 years   2 biologic daughters   2 stepsons, one Programme researcher, broadcasting/film/video for [employer]    Social Determinants of Health   Financial Resource Strain: Not on file  Food Insecurity: No Food Insecurity (03/01/2022)   Hunger Vital Sign    Worried About Running Out of Food in the Last Year: Never true    Ran Out of Food in the Last Year: Never true  Transportation Needs: No Transportation Needs (03/08/2022)   PRAPARE - Administrator, Civil Service (Medical): No    Lack of Transportation (Non-Medical): No  Physical Activity: Not on file  Stress: Not on file  Social Connections: Not on file  Intimate Partner Violence: Not on file    Past Surgical History:  Procedure Laterality Date   avulsion fracture of left hip     CARDIAC CATHETERIZATION  10/08/08   LEFT HEART CATH AND CORONARY ANGIOGRAPHY N/A 08/03/2016   Procedure: Left Heart Cath and Coronary Angiography;  Surgeon: Dolores Patty, MD;  Location: [facility] INVASIVE CV LAB;  Service: Cardiovascular;  Laterality: N/A;   TONSILLECTOMY     TOTAL KNEE ARTHROPLASTY Right 04/21/2021   Procedure: RIGHT TOTAL KNEE ARTHROPLASTY;  Surgeon: Cammy Copa, MD;  Location: [facility] OR;  Service: Orthopedics;  Laterality: Right;   UVULOPALATOPHARYNGOPLASTY      Family History  Problem Relation Age of Onset   Diabetes Mother    Hypertension Mother    Heart disease Mother    Cancer Father        Throat cancer   Stroke Father    Stroke Brother 66   Prostate cancer Neg Hx    Colon cancer Neg Hx    Colon polyps Neg Hx    Esophageal cancer Neg Hx    Rectal cancer Neg Hx    Stomach cancer  Neg Hx     Allergies  Allergen Reactions   Other     Almonds-caused swelling in hands   Shrimp [Shellfish Allergy] Swelling    Current Outpatient Medications on File Prior to Visit  Medication Sig Dispense Refill   acetaminophen (TYLENOL) 500 MG tablet Take 500 mg by mouth every 6 (six) hours as needed for mild pain.     acetaminophen-codeine (TYLENOL #3) 300-30 MG tablet Take 1 tablet by mouth every 6 (six) hours as needed for moderate pain. 30 tablet 0   aspirin 81 MG tablet Take 81 mg by mouth daily.       atorvastatin (LIPITOR) 20 MG tablet Take 1 tablet (20 mg total) by mouth daily. 90 tablet 3   carvedilol (COREG) 25 MG tablet Take 1 tablet (25 mg total) by mouth 2 (two) times daily with a meal. 180 tablet 3   Chlorpheniramine-DM (CORICIDIN HBP COUGH/COLD PO) Take 1 tablet by mouth every 12 (twelve) hours as needed (cold symptoms).     empagliflozin (JARDIANCE) 10 MG TABS tablet Take 1 tablet (10 mg total) by mouth daily before breakfast. 90 tablet 3   felodipine (PLENDIL) 2.5 MG 24 hr tablet Take 1 tablet (2.5 mg total) by mouth daily. 90 tablet 3   furosemide (LASIX) 40 MG tablet Take 1 tablet by mouth once daily 30 tablet 0   glipiZIDE (GLUCOTROL XL) 10 MG 24 hr tablet Take 1 tablet (10 mg total) by mouth daily with breakfast. 90 tablet 2   hydrALAZINE (APRESOLINE) 50 MG tablet Take 1 tablet (50 mg total) by mouth 3 (three) times daily. 270 tablet 1   lisinopril (ZESTRIL) 40 MG tablet Take 1 tablet (40 mg total) by mouth daily. 90 tablet 3   meloxicam (MOBIC) 15 MG tablet Take 1 tablet (15 mg total) by mouth daily with breakfast. 30 tablet 1   metFORMIN (GLUCOPHAGE) 500 MG tablet Take 1 tablet (500 mg total) by mouth 2 (two) times daily with a meal. 180 tablet 0   potassium chloride (KLOR-CON M) 10 MEQ tablet Take 1 tablet by mouth once daily 30 tablet 0   spironolactone (ALDACTONE) 25 MG tablet Take 2 tablets (50 mg total) by mouth daily. 180 tablet 3   tadalafil (CIALIS) 20 MG  tablet Take 20 mg by mouth daily as needed.     No current facility-administered medications on file prior to visit.    There were no vitals taken for this visit.      Objective:   Physical Exam  Vitals and nursing note reviewed.  Constitutional:      Appearance: Normal appearance. He is obese.  Cardiovascular:     Rate and Rhythm: Normal rate and regular rhythm.     Pulses: Normal pulses.     Heart sounds: Normal heart sounds.  Pulmonary:     Effort: Pulmonary effort is normal.     Breath sounds: Normal breath sounds.  Skin:    General: Skin is warm and dry.  Neurological:     General: No focal deficit present.     Mental Status: He is alert and oriented to person, place, and time.  Psychiatric:        Mood and Affect: Mood normal.        Behavior: Behavior normal.        Thought Content: Thought content normal.        Judgment: Judgment normal.       Assessment & Plan:

## 2022-12-14 NOTE — Progress Notes (Signed)
---Patient did not show for appointment. Note left for templating purposes only---   ADVANCED HF CLINIC NOTE  Referring Physician: Shirline Frees, NP Primary Care: Shirline Frees, NP Primary Cardiologist: None  HPI:  Caleb Garcia is a 57 y.o. male with h/o HTN, obesity, OSA s/p UPPP, ? 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 We last saw him in 4/18 and he is referred back by Shirline Frees for f/u on his HF.   Cardiac catheterization in 7/10 EF 25-30% no CAD. Previously told he had very mild OSA with AHI 8. Saw Dr. Shelle Iron and decided to keep off CPAP for the time being.    Was in [facility], [place] and moved back about 1 year ago. Working in [employer], broadcasting/film/video. We saw him for first time in 5/24.   Echo 5/24 EF 35-40%   ECHO 10/2008 25-30%  ECHO 02/2009 EF 35-40%  ECHO 04/2013 EF 35-40% ECHO 06/18/2014: EF 45-50%      SH: Lives at home with wife. Has alcohol on occasion. No drug use. No smoking FH: Brother has HTN        Mom kidney transplant Dad throat cancer   Past Medical History:  Diagnosis Date   Arthritis    Cardiomyopathy    Non-ischemic   CHF (congestive heart failure) (HCC)    Diabetes mellitus without complication (HCC)    HTN (hypertension)    Hyperlipidemia    Hypokalemia    Obesity    OSA (obstructive sleep apnea)    Sleep apnea    had surgery- no cpap   Stroke (HCC) 10/2008   Systolic CHF (HCC)     Current Outpatient Medications  Medication Sig Dispense Refill   acetaminophen (TYLENOL) 500 MG tablet Take 500 mg by mouth every 6 (six) hours as needed for mild pain.     acetaminophen-codeine (TYLENOL #3) 300-30 MG tablet Take 1 tablet by mouth every 6 (six) hours as needed for moderate pain. 30 tablet 0   aspirin 81 MG tablet Take 81 mg by mouth daily.       atorvastatin (LIPITOR) 20 MG tablet Take 1 tablet (20 mg total) by mouth daily. 90 tablet 3    carvedilol (COREG) 25 MG tablet Take 1 tablet (25 mg total) by mouth 2 (two) times daily with a meal. 180 tablet 3   Chlorpheniramine-DM (CORICIDIN HBP COUGH/COLD PO) Take 1 tablet by mouth every 12 (twelve) hours as needed (cold symptoms).     empagliflozin (JARDIANCE) 10 MG TABS tablet Take 1 tablet (10 mg total) by mouth daily before breakfast. 90 tablet 3   felodipine (PLENDIL) 2.5 MG 24 hr tablet Take 1 tablet (2.5 mg total) by mouth daily. 90 tablet 3   furosemide (LASIX) 40 MG tablet Take 1 tablet by mouth once daily 30 tablet 0   glipiZIDE (GLUCOTROL XL) 10 MG 24 hr tablet Take 1 tablet (10 mg total) by mouth daily with breakfast. 90 tablet 2   hydrALAZINE (APRESOLINE) 50 MG tablet Take 1 tablet (50 mg total) by mouth 3 (three) times daily. 270 tablet 1   lisinopril (ZESTRIL) 40 MG tablet Take 1 tablet (40 mg total) by mouth daily. 90 tablet 3   meloxicam (MOBIC) 15 MG tablet Take 1 tablet (15 mg total) by mouth daily with breakfast. 30 tablet 1   metFORMIN (GLUCOPHAGE) 500 MG tablet Take 1 tablet (500 mg total) by mouth 2 (two) times  daily with a meal. 180 tablet 0   potassium chloride (KLOR-CON M) 10 MEQ tablet Take 1 tablet by mouth once daily 30 tablet 0   spironolactone (ALDACTONE) 25 MG tablet Take 2 tablets (50 mg total) by mouth daily. 180 tablet 3   tadalafil (CIALIS) 20 MG tablet Take 20 mg by mouth daily as needed.     No current facility-administered medications for this encounter.    Allergies  Allergen Reactions   Other     Almonds-caused swelling in hands   Shrimp [Shellfish Allergy] Swelling      Social History   Socioeconomic History   Marital status: Married    Spouse name: Not on file   Number of children: Not on file   Years of education: Not on file   Highest education level: Not on file  Occupational History    Employer: UNEMPLOYED  Tobacco Use   Smoking status: Some Days    Types: Cigars   Smokeless tobacco: Never  Vaping Use   Vaping status:  Never Used  Substance and Sexual Activity   Alcohol use: No   Drug use: No   Sexual activity: Not on file  Other Topics Concern   Not on file  Social History Narrative   Married 6 years   2 biologic daughters   2 stepsons, one Programme researcher, broadcasting/film/video for [employer]    Social Determinants of Health   Financial Resource Strain: Not on file  Food Insecurity: No Food Insecurity (03/01/2022)   Hunger Vital Sign    Worried About Running Out of Food in the Last Year: Never true    Ran Out of Food in the Last Year: Never true  Transportation Needs: No Transportation Needs (03/08/2022)   PRAPARE - Administrator, Civil Service (Medical): No    Lack of Transportation (Non-Medical): No  Physical Activity: Not on file  Stress: Not on file  Social Connections: Not on file  Intimate Partner Violence: Not on file      Family History  Problem Relation Age of Onset   Diabetes Mother    Hypertension Mother    Heart disease Mother    Cancer Father        Throat cancer   Stroke Father    Stroke Brother 48   Prostate cancer Neg Hx    Colon cancer Neg Hx    Colon polyps Neg Hx    Esophageal cancer Neg Hx    Rectal cancer Neg Hx    Stomach cancer Neg Hx     There were no vitals filed for this visit.   PHYSICAL EXAM: General:  Well appearing. No resp difficulty HEENT: normal Neck: supple. no JVD. Carotids 2+ bilat; no bruits. No lymphadenopathy or thryomegaly appreciated. Cor: PMI nondisplaced. Regular rate & rhythm. No rubs, gallops or murmurs. Lungs: clear Abdomen: obese soft, nontender, nondistended. No hepatosplenomegaly. No bruits or masses. Good bowel sounds. Extremities: no cyanosis, clubbing, rash, edema Neuro: alert & orientedx3, cranial nerves grossly intact. moves all 4 extremities w/o difficulty. Affect pleasant   ECG: NSR 84 diffuse TWI Personally reviewed   ASSESSMENT & PLAN:  1) Chronic systolic HF: NICM, - Cardiac catheterization on 7/10 EF 25-30% no  CAD - Echo 06/2014 EF 45-50%, RV normal.  - POCUS 08/12/22 EF 30-35%  - Echo 5/24 EF 35-40% - NYHA I  - Volume status stable on exam. Switch lasix to PRN - Continue Jardiance 10 - Continue coreg 25 mg BID -  Continue lisinopril 40 mg daily. - Continue hydralazine 25 mg TID - Continue spiro 50 mg daily - Will likely need cMRI  2) HTN - Blood pressure well controlled. Continue current regimen.  3) OSA  - Mild. Has not wanted CPAP  4) Obesity - consider NFA2ZH  Arvilla Meres, MD  9:57 PM

## 2022-12-15 ENCOUNTER — Inpatient Hospital Stay (HOSPITAL_COMMUNITY)
Admission: RE | Admit: 2022-12-15 | Discharge: 2022-12-15 | Disposition: A | Discharge status: Home / Self Care | Payer: 59 | Source: Ambulatory Visit | Attending: Internal Medicine | Admitting: Internal Medicine

## 2023-01-05 NOTE — Progress Notes (Addendum)
ADVANCED HF CLINIC NOTE   Primary Care: Shirline Frees, NP HF Cardiologist: Dr. Demetrius Revel  HPI: Caleb Garcia is a 57 y.o.male with h/o HTN, obesity, OSA s/p UPPP, ? 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 We last saw him in 4/18 and he is referred back by Shirline Frees for f/u on his HF.   Cardiac catheterization in 7/10 EF 25-30% no CAD. Previously told he had very mild OSA with AHI 8. Saw Dr. Shelle Iron and decided to keep off CPAP for the time being.    Was in [place], [place] and moved back 08/2021. We saw him for first time in 5/24.   Echo 5/24 EF 35-40%  Today he returns for HF follow up. Overall feeling fine. He is not SOB with activity. Denies palpitations, CP, dizziness, edema, or PND/Orthopnea. Appetite ok. No fever or chills. Weight at home 250 pounds. Taking all medications. Works in [employer], broadcasting/film/video. Has an occasional cigar, occasional ETOH use, no drugs.  Cardiac Studies - Echo 5/24 EF 35-40%   - Echo 3/16: EF 45-50%  - Echo 1/15: EF 35-40%  - Echo 11/10: EF 35-40%   - Echo 7/10: EF 25-30%   - Cath 7/10: no CAD   SH: Lives at home with wife. Has alcohol on occasion. No drug use. No smoking  FH: Brother has HTN        Mom kidney transplant, Dad throat cancer  Past Medical History:  Diagnosis Date   Arthritis    Cardiomyopathy    Non-ischemic   CHF (congestive heart failure) (HCC)    Diabetes mellitus without complication (HCC)    HTN (hypertension)    Hyperlipidemia    Hypokalemia    Obesity    OSA (obstructive sleep apnea)    Sleep apnea    had surgery- no cpap   Stroke (HCC) 10/2008   Systolic CHF (HCC)    Current Outpatient Medications  Medication Sig Dispense Refill   acetaminophen-codeine (TYLENOL #3) 300-30 MG tablet Take 1 tablet by mouth every 6 (six) hours as needed for moderate pain. 30 tablet 0   aspirin 81 MG tablet Take 81 mg by mouth  daily.       atorvastatin (LIPITOR) 20 MG tablet Take 1 tablet (20 mg total) by mouth daily. 90 tablet 3   carvedilol (COREG) 25 MG tablet Take 1 tablet (25 mg total) by mouth 2 (two) times daily with a meal. 180 tablet 3   Chlorpheniramine-DM (CORICIDIN HBP COUGH/COLD PO) Take 1 tablet by mouth every 12 (twelve) hours as needed (cold symptoms).     empagliflozin (JARDIANCE) 10 MG TABS tablet Take 1 tablet (10 mg total) by mouth daily before breakfast. 90 tablet 3   felodipine (PLENDIL) 2.5 MG 24 hr tablet Take 1 tablet (2.5 mg total) by mouth daily. 90 tablet 3   glipiZIDE (GLUCOTROL XL) 10 MG 24 hr tablet Take 1 tablet (10 mg total) by mouth daily with breakfast. 90 tablet 2   hydrALAZINE (APRESOLINE) 50 MG tablet Take 1 tablet (50 mg total) by mouth 3 (three) times daily. 270 tablet 1   lisinopril (ZESTRIL) 40 MG tablet Take 1 tablet (40 mg total) by mouth daily. 90 tablet 3   metFORMIN (GLUCOPHAGE) 500 MG tablet Take 1 tablet (500 mg total) by mouth 2 (two) times daily with a meal. 180 tablet 0   spironolactone (ALDACTONE) 25 MG tablet Take 2 tablets (50 mg total)  by mouth daily. 180 tablet 3   tadalafil (CIALIS) 20 MG tablet Take 20 mg by mouth daily as needed.     No current facility-administered medications for this encounter.   Allergies  Allergen Reactions   Other     Almonds-caused swelling in hands   Shrimp [Shellfish Allergy] Swelling   Social History   Socioeconomic History   Marital status: Married    Spouse name: Not on file   Number of children: Not on file   Years of education: Not on file   Highest education level: Not on file  Occupational History    Employer: UNEMPLOYED  Tobacco Use   Smoking status: Some Days    Types: Cigars   Smokeless tobacco: Never  Vaping Use   Vaping status: Never Used  Substance and Sexual Activity   Alcohol use: No   Drug use: No   Sexual activity: Not on file  Other Topics Concern   Not on file  Social History Narrative    Married 6 years   2 biologic daughters   2 stepsons, one Programme researcher, broadcasting/film/video for [employer]    Social Determinants of Health   Financial Resource Strain: Not on file  Food Insecurity: No Food Insecurity (03/01/2022)   Hunger Vital Sign    Worried About Running Out of Food in the Last Year: Never true    Ran Out of Food in the Last Year: Never true  Transportation Needs: No Transportation Needs (03/08/2022)   PRAPARE - Administrator, Civil Service (Medical): No    Lack of Transportation (Non-Medical): No  Physical Activity: Not on file  Stress: Not on file  Social Connections: Not on file  Intimate Partner Violence: Not on file    Family History  Problem Relation Age of Onset   Diabetes Mother    Hypertension Mother    Heart disease Mother    Cancer Father        Throat cancer   Stroke Father    Stroke Brother 47   Prostate cancer Neg Hx    Colon cancer Neg Hx    Colon polyps Neg Hx    Esophageal cancer Neg Hx    Rectal cancer Neg Hx    Stomach cancer Neg Hx    BP 130/84   Pulse 91   Wt 114.1 kg (251 lb 9.6 oz)   SpO2 99%   BMI 36.10 kg/m   Wt Readings from Last 3 Encounters:  01/10/23 114.1 kg (251 lb 9.6 oz)  08/24/22 108.4 kg (239 lb)  08/12/22 111.1 kg (245 lb)   PHYSICAL EXAM: General:  NAD. No resp difficulty, walked into clinic HEENT: Normal Neck: Supple. No JVD. Carotids 2+ bilat; no bruits. No lymphadenopathy or thryomegaly appreciated. Cor: PMI nondisplaced. Regular rate & rhythm. No rubs, gallops or murmurs. Lungs: Clear Abdomen: Soft, nontender, nondistended. No hepatosplenomegaly. No bruits or masses. Good bowel sounds. Extremities: No cyanosis, clubbing, rash, edema Neuro: Alert & oriented x 3, cranial nerves grossly intact. Moves all 4 extremities w/o difficulty. Affect pleasant.  ECG (personally reviewed): NSR 97 bpm  ASSESSMENT & PLAN:  1) Chronic systolic HF:  - NICM - Cardiac cath 7/10: no CAD, EF 25-30% - Echo 3/16: EF  45-50%, RV normal.  - POCUS 08/12/22: EF 30-35%  - Echo 5/24: EF 35-40% - NYHA I , volume stable today - Continue Lasix PRN, refill PRN KCL  - Continue Jardiance 10 mg daily. No GU symptoms - Continue  Coreg 25 mg bid - Continue lisinopril 40 mg daily. - Continue hydralazine 50 mg tid - Continue spiro 50 mg daily - Arrange cMRI to evaluate for infiltrative disease. - Labs today.  2) HTN - Blood pressure stable - Continue current regimen.  3) OSA  - Mild. Has not wanted CPAP  4) Obesity - Body mass index is 36.1 kg/m. - Consider GLP1RA  Follow up in 4 months with Dr. Gala Romney  Jacklynn Ganong, FNP  3:08 PM

## 2023-01-07 ENCOUNTER — Telehealth (HOSPITAL_COMMUNITY): Payer: Self-pay

## 2023-01-07 NOTE — Telephone Encounter (Signed)
Called and left patient a voice message  to confirm/remind patient of their appointment at the [facility] on 01/10/23.   And to bring in all medications and/or complete list.

## 2023-01-10 ENCOUNTER — Ambulatory Visit (HOSPITAL_COMMUNITY)
Admission: RE | Admit: 2023-01-10 | Discharge: 2023-01-10 | Disposition: A | Discharge status: Home / Self Care | Payer: 59 | Source: Ambulatory Visit | Attending: Family Medicine

## 2023-01-10 ENCOUNTER — Encounter (HOSPITAL_COMMUNITY): Payer: Self-pay

## 2023-01-10 VITALS — BP 130/84 | HR 91 | Wt 251.6 lb

## 2023-01-10 DIAGNOSIS — I5022 Chronic systolic (congestive) heart failure: Secondary | ICD-10-CM | POA: Diagnosis present

## 2023-01-10 DIAGNOSIS — Z6836 Body mass index (BMI) 36.0-36.9, adult: Secondary | ICD-10-CM | POA: Insufficient documentation

## 2023-01-10 DIAGNOSIS — Z7984 Long term (current) use of oral hypoglycemic drugs: Secondary | ICD-10-CM | POA: Insufficient documentation

## 2023-01-10 DIAGNOSIS — Z8673 Personal history of transient ischemic attack (TIA), and cerebral infarction without residual deficits: Secondary | ICD-10-CM | POA: Insufficient documentation

## 2023-01-10 DIAGNOSIS — E669 Obesity, unspecified: Secondary | ICD-10-CM | POA: Insufficient documentation

## 2023-01-10 DIAGNOSIS — I428 Other cardiomyopathies: Secondary | ICD-10-CM | POA: Insufficient documentation

## 2023-01-10 DIAGNOSIS — I110 Hypertensive heart disease with heart failure: Secondary | ICD-10-CM | POA: Insufficient documentation

## 2023-01-10 DIAGNOSIS — I10 Essential (primary) hypertension: Secondary | ICD-10-CM

## 2023-01-10 DIAGNOSIS — F17290 Nicotine dependence, other tobacco product, uncomplicated: Secondary | ICD-10-CM | POA: Insufficient documentation

## 2023-01-10 DIAGNOSIS — G4733 Obstructive sleep apnea (adult) (pediatric): Secondary | ICD-10-CM | POA: Diagnosis not present

## 2023-01-10 DIAGNOSIS — Z79899 Other long term (current) drug therapy: Secondary | ICD-10-CM | POA: Diagnosis not present

## 2023-01-10 LAB — BASIC METABOLIC PANEL
Anion gap: 11 (ref 5–15)
BUN: 11 mg/dL (ref 6–20)
CO2: 23 mmol/L (ref 22–32)
Calcium: 9 mg/dL (ref 8.9–10.3)
Chloride: 104 mmol/L (ref 98–111)
Creatinine, Ser: 1.16 mg/dL (ref 0.61–1.24)
GFR, Estimated: >60 mL/min (ref >60)
Glucose, Bld: 106 mg/dL — ABNORMAL HIGH (ref 70–99)
Potassium: 3.6 mmol/L (ref 3.5–5.1)
Sodium: 138 mmol/L (ref 135–145)

## 2023-01-10 LAB — CBC
HCT: 40.6 % (ref 39.0–52.0)
Hemoglobin: 13.6 g/dL (ref 13.0–17.0)
MCH: 30.8 pg (ref 26.0–34.0)
MCHC: 33.5 g/dL (ref 30.0–36.0)
MCV: 91.9 fL (ref 80.0–100.0)
Platelets: 236 10*3/uL (ref 150–400)
RBC: 4.42 MIL/uL (ref 4.22–5.81)
RDW: 13 % (ref 11.5–15.5)
WBC: 6.2 10*3/uL (ref 4.0–10.5)
nRBC: 0 % (ref 0.0–0.2)

## 2023-01-10 LAB — BRAIN NATRIURETIC PEPTIDE: B Natriuretic Peptide: 42.7 pg/mL (ref 0.0–100.0)

## 2023-01-10 MED ORDER — POTASSIUM CHLORIDE CRYS ER 20 MEQ PO TBCR: 20.0000 meq | EXTENDED_RELEASE_TABLET | ORAL | 6 refills | Status: DC | PRN

## 2023-01-10 NOTE — Patient Instructions (Addendum)
Thank you for coming in today  If you had labs drawn today, any labs that are abnormal the clinic will call you No news is good news  Medications: Potassium 20 meq  when taking Lasix  Follow up appointments:  Your physician recommends that you schedule a follow-up appointment in:  4 months With Dr. Gala Romney You will receive a reminder letter in the mail a few months in advance. If you don't receive a letter, please call our office to schedule the follow-up appointment.  You have been ordered a Cardiac MRI once insurance approval, their office will call you for further appointment details     Do the following things EVERYDAY: Weigh yourself in the morning before breakfast. Write it down and keep it in a log. Take your medicines as prescribed Eat low salt foods--Limit salt (sodium) to 2000 mg per day.  Stay as active as you can everyday Limit all fluids for the day to less than 2 liters   At the [facility], you and your health needs are our priority. As part of our continuing mission to provide you with exceptional heart care, we have created designated Provider Care Teams. These Care Teams include your primary Cardiologist (physician) and Advanced Practice Providers (APPs- Physician Assistants and Nurse Practitioners) who all work together to provide you with the care you need, when you need it.   You may see any of the following providers on your designated Care Team at your next follow up: Dr Arvilla Meres Dr Marca Ancona Dr. Marcos Eke, NP Robbie Lis, [place] [facility] [place], [place] Brynda Peon, NP Karle Plumber, PharmD   Please be sure to bring in all your medications bottles to every appointment.    Thank you for choosing [facility]  If you have any questions or concerns before your next appointment please send [place] a message through [place] or call our office at [phone].     TO LEAVE A MESSAGE FOR THE NURSE SELECT OPTION 2, PLEASE LEAVE A MESSAGE INCLUDING: YOUR NAME DATE OF BIRTH CALL BACK NUMBER REASON FOR CALL**this is important as we prioritize the call backs  YOU WILL RECEIVE A CALL BACK THE SAME DAY AS LONG AS YOU CALL BEFORE 4:00 PM

## 2023-01-31 ENCOUNTER — Encounter (HOSPITAL_COMMUNITY): Payer: Self-pay

## 2023-02-01 ENCOUNTER — Telehealth (HOSPITAL_COMMUNITY): Payer: Self-pay | Admitting: *Deleted

## 2023-02-01 NOTE — Telephone Encounter (Signed)
Reaching out to patient to offer assistance regarding upcoming cardiac imaging study; pt verbalizes understanding of appt date/time, parking situation and where to check in, pre-test NPO status and medications ordered, and verified current allergies; name and call back number provided for further questions should they arise Johney Frame RN Navigator Cardiac Imaging [facility] [phone] office [phone] cell   Patient reports prior R total knee replacement, denies claustrophobia.

## 2023-02-02 ENCOUNTER — Ambulatory Visit (HOSPITAL_COMMUNITY)
Admission: RE | Admit: 2023-02-02 | Discharge: 2023-02-02 | Disposition: A | Discharge status: Home / Self Care | Payer: 59 | Source: Ambulatory Visit | Attending: Internal Medicine | Admitting: Internal Medicine

## 2023-02-02 ENCOUNTER — Other Ambulatory Visit (HOSPITAL_COMMUNITY): Payer: Self-pay | Admitting: Internal Medicine

## 2023-02-02 DIAGNOSIS — I5022 Chronic systolic (congestive) heart failure: Secondary | ICD-10-CM | POA: Insufficient documentation

## 2023-02-02 MED ORDER — GADOBUTROL 1 MMOL/ML IV SOLN
14.0000 mL | Freq: Once | INTRAVENOUS | Status: AC | PRN
  Administered 2023-02-02: 14 mL via INTRAVENOUS

## 2023-02-03 IMAGING — CR DG CHEST 2V
2 series · 2 of 2 positions shown · non-contrast
Comparison: 08/03/2018

CLINICAL DATA: Chest pain.  Left-sided pain.

EXAM:
CHEST - 2 VIEW

[chest pa]
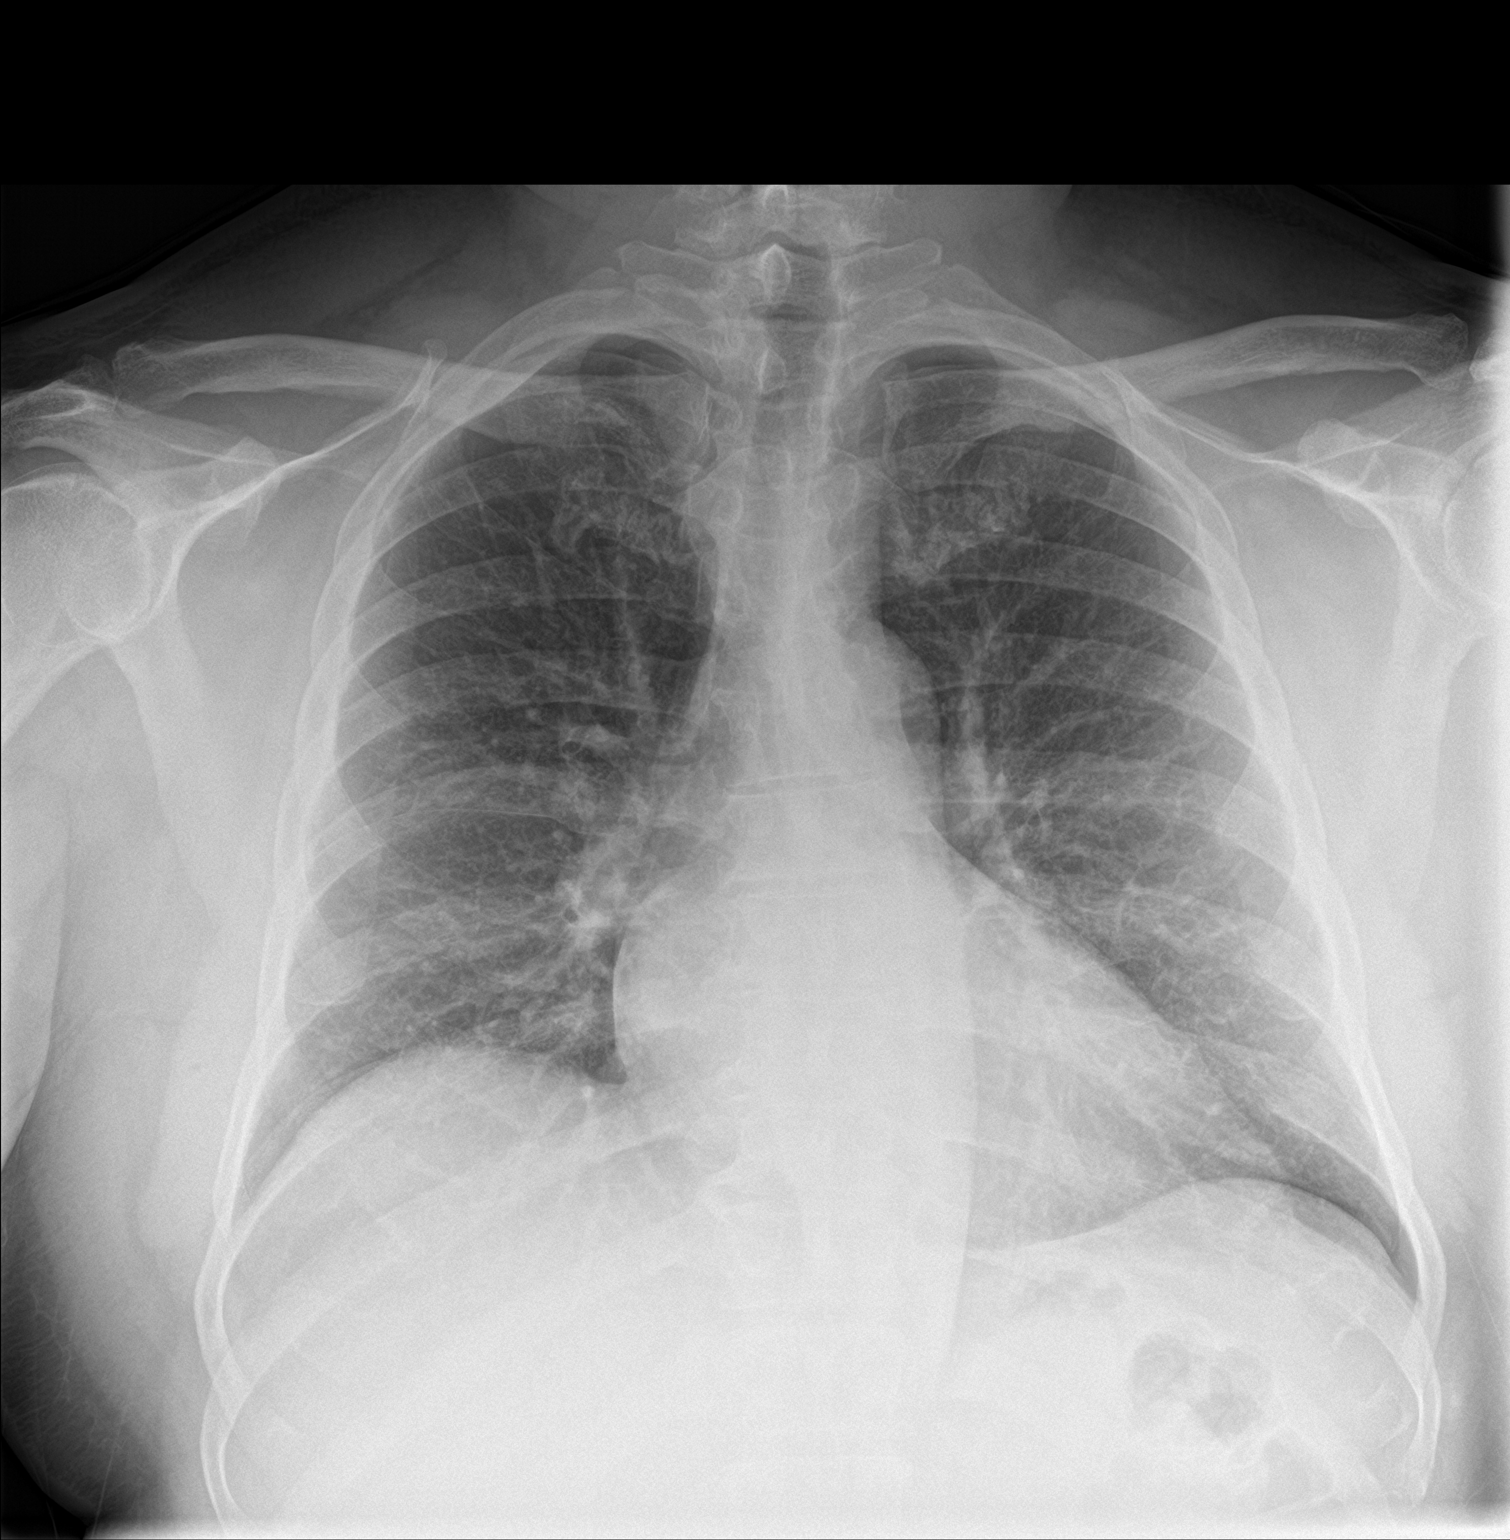

[chest lat]
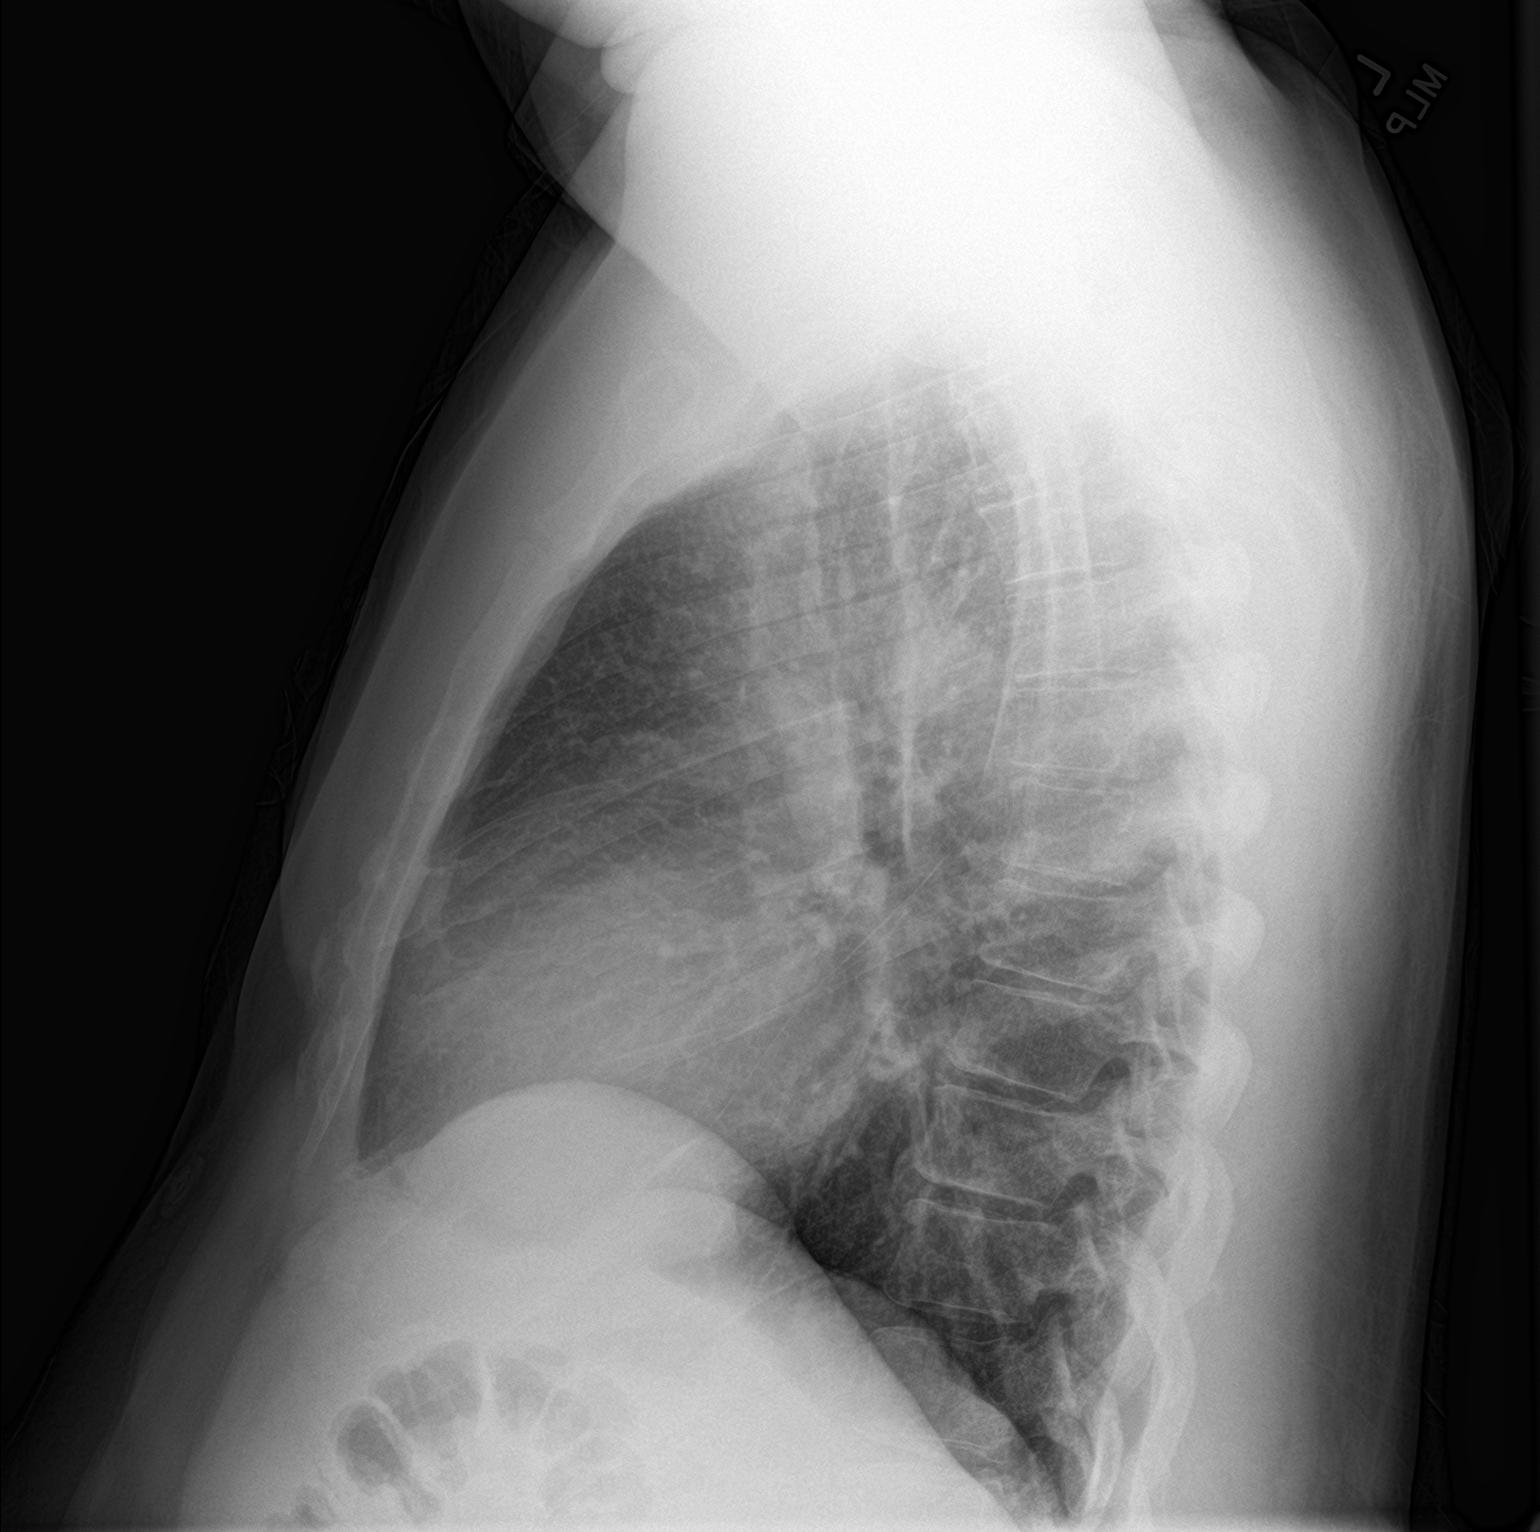

[2 of 2 positions shown; findings below may reference images not displayed]

FINDINGS: Stable upper normal heart size.The cardiomediastinal contours are
normal. Pulmonary vasculature is normal. No consolidation, pleural
effusion, or pneumothorax. No acute osseous abnormalities are seen.
IMPRESSION: No acute chest findings.

## 2023-02-22 LAB — HM DIABETES EYE EXAM

## 2023-03-24 ENCOUNTER — Encounter: Payer: Self-pay | Admitting: Adult Health

## 2023-04-13 ENCOUNTER — Other Ambulatory Visit: Payer: Self-pay | Admitting: Adult Health

## 2023-04-13 DIAGNOSIS — E1169 Type 2 diabetes mellitus with other specified complication: Secondary | ICD-10-CM

## 2023-05-05 ENCOUNTER — Other Ambulatory Visit: Payer: Self-pay | Admitting: Adult Health

## 2023-05-05 DIAGNOSIS — I10 Essential (primary) hypertension: Secondary | ICD-10-CM

## 2023-05-05 DIAGNOSIS — I5022 Chronic systolic (congestive) heart failure: Secondary | ICD-10-CM

## 2023-05-06 NOTE — Telephone Encounter (Signed)
 Patient need to schedule for more refills.

## 2023-05-13 ENCOUNTER — Other Ambulatory Visit: Payer: Self-pay | Admitting: Adult Health

## 2023-05-13 DIAGNOSIS — E782 Mixed hyperlipidemia: Secondary | ICD-10-CM

## 2023-05-13 DIAGNOSIS — E1169 Type 2 diabetes mellitus with other specified complication: Secondary | ICD-10-CM

## 2023-05-13 DIAGNOSIS — I10 Essential (primary) hypertension: Secondary | ICD-10-CM

## 2023-05-13 DIAGNOSIS — I5022 Chronic systolic (congestive) heart failure: Secondary | ICD-10-CM

## 2023-05-13 NOTE — Telephone Encounter (Signed)
 Copied from CRM [id number]. Topic: Clinical - Medication Refill >> May 13, 2023  1:47 PM Rosina BIRCH wrote: Most Recent Primary Care Visit:  Provider: MERNA HUXLEY  Department: [facility]  Visit Type: OFFICE VISIT  Date: 08/24/2022  Medication: lisinopril , metFORMIN , glipiZIDE , felodipine , carvedilol , atorvastatin    Has the patient contacted their pharmacy? Yes (Agent: If no, request that the patient contact the pharmacy for the refill. If patient does not wish to contact the pharmacy document the reason why and proceed with request.) (Agent: If yes, when and what did the pharmacy advise?)  Is this the correct pharmacy for this prescription? Yes If no, delete pharmacy and type the correct one.  This is the patient's preferred pharmacy:  [facility] [address] [place], [place] - [address] [place] [address] Phone: [phone] Fax: [phone]   Has the prescription been filled recently? No  Is the patient out of the medication? Yes  Has the patient been seen for an appointment in the last year OR does the patient have an upcoming appointment? Yes  Can we respond through MyChart? Yes  Agent: Please be advised that Rx refills may take up to 3 business days. We ask that you follow-up with your pharmacy.

## 2023-05-17 ENCOUNTER — Ambulatory Visit: Payer: 59 | Admitting: Adult Health

## 2023-05-17 ENCOUNTER — Encounter: Payer: Self-pay | Admitting: Adult Health

## 2023-05-17 VITALS — BP 142/100 | HR 90 | Temp 98.3°F | Ht 70.0 in | Wt 256.0 lb

## 2023-05-17 DIAGNOSIS — E66812 Obesity, class 2: Secondary | ICD-10-CM

## 2023-05-17 DIAGNOSIS — Z125 Encounter for screening for malignant neoplasm of prostate: Secondary | ICD-10-CM

## 2023-05-17 DIAGNOSIS — I5022 Chronic systolic (congestive) heart failure: Secondary | ICD-10-CM

## 2023-05-17 DIAGNOSIS — Z152 Genetic susceptibility to obesity: Secondary | ICD-10-CM

## 2023-05-17 DIAGNOSIS — E8882 Obesity due to disruption of MC4R pathway: Secondary | ICD-10-CM

## 2023-05-17 DIAGNOSIS — Z1211 Encounter for screening for malignant neoplasm of colon: Secondary | ICD-10-CM

## 2023-05-17 DIAGNOSIS — E119 Type 2 diabetes mellitus without complications: Secondary | ICD-10-CM | POA: Diagnosis not present

## 2023-05-17 DIAGNOSIS — E782 Mixed hyperlipidemia: Secondary | ICD-10-CM

## 2023-05-17 DIAGNOSIS — I10 Essential (primary) hypertension: Secondary | ICD-10-CM | POA: Diagnosis not present

## 2023-05-17 DIAGNOSIS — N521 Erectile dysfunction due to diseases classified elsewhere: Secondary | ICD-10-CM

## 2023-05-17 DIAGNOSIS — Z0000 Encounter for general adult medical examination without abnormal findings: Secondary | ICD-10-CM

## 2023-05-17 DIAGNOSIS — Z6836 Body mass index (BMI) 36.0-36.9, adult: Secondary | ICD-10-CM

## 2023-05-17 DIAGNOSIS — Z7984 Long term (current) use of oral hypoglycemic drugs: Secondary | ICD-10-CM | POA: Diagnosis not present

## 2023-05-17 LAB — POCT GLYCOSYLATED HEMOGLOBIN (HGB A1C): Hemoglobin A1C: 6.6 % — AB (ref 4.0–5.6)

## 2023-05-17 MED ORDER — FELODIPINE ER 2.5 MG PO TB24: 2.5000 mg | ORAL_TABLET | Freq: Every day | ORAL | 3 refills | Status: AC

## 2023-05-17 MED ORDER — METFORMIN HCL 500 MG PO TABS: 500.0000 mg | ORAL_TABLET | Freq: Two times a day (BID) | ORAL | 0 refills | Status: DC

## 2023-05-17 MED ORDER — GLIPIZIDE ER 10 MG PO TB24
10.0000 mg | ORAL_TABLET | Freq: Every day | ORAL | 0 refills | Status: DC
Start: 2023-05-17 — End: 2023-09-06

## 2023-05-17 MED ORDER — TIRZEPATIDE 5 MG/0.5ML ~~LOC~~ SOAJ: 5.0000 mg | SUBCUTANEOUS | 0 refills | Status: AC

## 2023-05-17 MED ORDER — ATORVASTATIN CALCIUM 20 MG PO TABS: 20.0000 mg | ORAL_TABLET | Freq: Every day | ORAL | 3 refills | Status: AC

## 2023-05-17 MED ORDER — LISINOPRIL 40 MG PO TABS: 40.0000 mg | ORAL_TABLET | Freq: Every day | ORAL | 3 refills | Status: DC

## 2023-05-17 MED ORDER — SPIRONOLACTONE 25 MG PO TABS: 50.0000 mg | ORAL_TABLET | Freq: Every day | ORAL | 3 refills | Status: AC

## 2023-05-17 NOTE — Patient Instructions (Addendum)
It was great seeing you today   We will follow up with you regarding your lab work   Please let me know if you need anything   Follow up for labs   [facility] [address]  Follow up in 2 months

## 2023-05-17 NOTE — Progress Notes (Signed)
Subjective:    Patient ID: Caleb Garcia, male    DOB: [birth year], 58 y.o.   MRN: [id number]  HPI Patient presents for yearly preventative medicine examination. He is a pleasant 58 year old male who  has a past medical history of Arthritis, Cardiomyopathy, CHF (congestive heart failure) (HCC), Diabetes mellitus without complication (HCC), HTN (hypertension), Hyperlipidemia, Hypokalemia, Obesity, OSA (obstructive sleep apnea), Sleep apnea, Stroke (HCC) (10/2008), and Systolic CHF (HCC).  Diabetes mellitus type 2-managed with metformin 500 mg twice daily, glipizide 10 mg XR and Jardiance 10 mg daily ( prescribed by Cardiology for CHF).   He does not monitor his blood sugars at home on a routine basis.  Denies episodes of hypoglycemia. He does stay active at work  Lab Results  Component Value Date   HGBA1C 6.6 (A) 05/17/2023   HGBA1C 7.2 (A) 07/28/2022   HGBA1C 8.8 (H) 04/23/2022   HTN -managed with lisinopril 40 mg daily, hydralazine 25 mg 3 times daily, Plendil 2.5 mg daily, Spirolactone 25 mg daily, and Coreg 25 mg daily.  He denies dizziness, lightheadedness, chest pain, or shortness of breath.  BP Readings from Last 3 Encounters:  05/17/23 (!) 142/100  01/10/23 130/84  08/24/22 130/82   CHF-has been seen in CHF clinic in October 2024 at which time he was placed on Jardiance 10 mg.  Denies lower extremity edema, chest pain, shortness of breath, or palpitations. He had a cardiac MRI done which showed an EF 34%.   ED-uses Cialis as needed   All immunizations and health maintenance protocols were reviewed with the patient and needed orders were placed.  Appropriate screening laboratory values were ordered for the patient including screening of hyperlipidemia, renal function and hepatic function. If indicated by BPH, a PSA was ordered.  Medication reconciliation,  past medical history, social history, problem list and allergies were reviewed in detail with the patient  Goals were  established with regard to weight loss, exercise, and  diet in compliance with medications Wt Readings from Last 3 Encounters:  05/17/23 256 lb (116.1 kg)  01/10/23 251 lb 9.6 oz (114.1 kg)  08/24/22 239 lb (108.4 kg)   Review of Systems Past Medical History:  Diagnosis Date   Arthritis    Cardiomyopathy    Non-ischemic   CHF (congestive heart failure) (HCC)    Diabetes mellitus without complication (HCC)    HTN (hypertension)    Hyperlipidemia    Hypokalemia    Obesity    OSA (obstructive sleep apnea)    Sleep apnea    had surgery- no cpap   Stroke (HCC) 10/2008   Systolic CHF (HCC)     Social History   Socioeconomic History   Marital status: Married    Spouse name: Not on file   Number of children: Not on file   Years of education: Not on file   Highest education level: Not on file  Occupational History    Employer: UNEMPLOYED  Tobacco Use   Smoking status: Some Days    Types: Cigars   Smokeless tobacco: Never  Vaping Use   Vaping status: Never Used  Substance and Sexual Activity   Alcohol use: No   Drug use: No   Sexual activity: Not on file  Other Topics Concern   Not on file  Social History Narrative   Married 6 years   2 biologic daughters   2 stepsons, one Programme researcher, broadcasting/film/video for [employer]    Social Drivers of Health  Financial Resource Strain: Not on file  Food Insecurity: No Food Insecurity (03/01/2022)   Hunger Vital Sign    Worried About Running Out of Food in the Last Year: Never true    Ran Out of Food in the Last Year: Never true  Transportation Needs: No Transportation Needs (03/08/2022)   PRAPARE - Administrator, Civil Service (Medical): No    Lack of Transportation (Non-Medical): No  Physical Activity: Not on file  Stress: Not on file  Social Connections: Not on file  Intimate Partner Violence: Not on file    Past Surgical History:  Procedure Laterality Date   avulsion fracture of left hip     CARDIAC  CATHETERIZATION  10/08/08   LEFT HEART CATH AND CORONARY ANGIOGRAPHY N/A 08/03/2016   Procedure: Left Heart Cath and Coronary Angiography;  Surgeon: Dolores Patty, MD;  Location: [facility] INVASIVE CV LAB;  Service: Cardiovascular;  Laterality: N/A;   TONSILLECTOMY     TOTAL KNEE ARTHROPLASTY Right 04/21/2021   Procedure: RIGHT TOTAL KNEE ARTHROPLASTY;  Surgeon: Cammy Copa, MD;  Location: [facility] OR;  Service: Orthopedics;  Laterality: Right;   UVULOPALATOPHARYNGOPLASTY      Family History  Problem Relation Age of Onset   Diabetes Mother    Hypertension Mother    Heart disease Mother    Cancer Father        Throat cancer   Stroke Father    Stroke Brother 76   Prostate cancer Neg Hx    Colon cancer Neg Hx    Colon polyps Neg Hx    Esophageal cancer Neg Hx    Rectal cancer Neg Hx    Stomach cancer Neg Hx     Allergies  Allergen Reactions   Other     Almonds-caused swelling in hands   Shrimp [Shellfish Allergy] Swelling    Current Outpatient Medications on File Prior to Visit  Medication Sig Dispense Refill   acetaminophen-codeine (TYLENOL #3) 300-30 MG tablet Take 1 tablet by mouth every 6 (six) hours as needed for moderate pain. 30 tablet 0   aspirin 81 MG tablet Take 81 mg by mouth daily.       atorvastatin (LIPITOR) 20 MG tablet Take 1 tablet (20 mg total) by mouth daily. 90 tablet 3   carvedilol (COREG) 25 MG tablet Take 1 tablet (25 mg total) by mouth 2 (two) times daily with a meal. 180 tablet 3   Chlorpheniramine-DM (CORICIDIN HBP COUGH/COLD PO) Take 1 tablet by mouth every 12 (twelve) hours as needed (cold symptoms).     empagliflozin (JARDIANCE) 10 MG TABS tablet Take 1 tablet (10 mg total) by mouth daily before breakfast. 90 tablet 3   felodipine (PLENDIL) 2.5 MG 24 hr tablet Take 1 tablet (2.5 mg total) by mouth daily. 90 tablet 3   glipiZIDE (GLUCOTROL XL) 10 MG 24 hr tablet Take 1 tablet (10 mg total) by mouth daily with breakfast. 90 tablet 2   lisinopril (ZESTRIL)  40 MG tablet Take 1 tablet (40 mg total) by mouth daily. 90 tablet 3   metFORMIN (GLUCOPHAGE) 500 MG tablet TAKE 1 TABLET BY MOUTH TWICE DAILY WITH A MEAL 180 tablet 0   potassium chloride SA (KLOR-CON M) 20 MEQ tablet Take 1 tablet (20 mEq total) by mouth as needed. 30 tablet 6   spironolactone (ALDACTONE) 25 MG tablet Take 2 tablets (50 mg total) by mouth daily. 180 tablet 3   tadalafil (CIALIS) 20 MG tablet Take 20 mg by  mouth daily as needed.     hydrALAZINE (APRESOLINE) 50 MG tablet Take 1 tablet (50 mg total) by mouth 3 (three) times daily. 270 tablet 1   No current facility-administered medications on file prior to visit.    BP (!) 142/100   Pulse 90   Temp 98.3 F (36.8 C) (Oral)   Ht 5\' 10"  (1.778 m)   Wt 256 lb (116.1 kg)   SpO2 97%   BMI 36.73 kg/m       Objective:   Physical Exam Vitals and nursing note reviewed.  Constitutional:      General: He is not in acute distress.    Appearance: Normal appearance. He is obese. He is not ill-appearing.  HENT:     Head: Normocephalic and atraumatic.     Right Ear: Tympanic membrane, ear canal and external ear normal. There is no impacted cerumen.     Left Ear: Tympanic membrane, ear canal and external ear normal. There is no impacted cerumen.     Nose: Nose normal. No congestion or rhinorrhea.     Mouth/Throat:     Mouth: Mucous membranes are moist.     Pharynx: Oropharynx is clear.  Eyes:     Extraocular Movements: Extraocular movements intact.     Conjunctiva/sclera: Conjunctivae normal.     Pupils: Pupils are equal, round, and reactive to light.  Neck:     Vascular: No carotid bruit.  Cardiovascular:     Rate and Rhythm: Normal rate and regular rhythm.     Pulses: Normal pulses.     Heart sounds: Normal heart sounds. No murmur heard.    No friction rub. No gallop.  Pulmonary:     Effort: Pulmonary effort is normal.     Breath sounds: Normal breath sounds.  Abdominal:     General: Abdomen is flat. Bowel sounds  are normal. There is no distension.     Palpations: Abdomen is soft. There is no mass.     Tenderness: There is no abdominal tenderness. There is no guarding or rebound.     Hernia: No hernia is present.  Musculoskeletal:        General: Normal range of motion.     Cervical back: Normal range of motion and neck supple.  Lymphadenopathy:     Cervical: No cervical adenopathy.  Skin:    General: Skin is warm and dry.     Capillary Refill: Capillary refill takes less than 2 seconds.  Neurological:     General: No focal deficit present.     Mental Status: He is alert and oriented to person, place, and time.  Psychiatric:        Mood and Affect: Mood normal.        Behavior: Behavior normal.        Thought Content: Thought content normal.        Judgment: Judgment normal.        Assessment & Plan:  1. Routine general medical examination at a health care facility (Primary) Today patient counseled on age appropriate routine health concerns for screening and prevention, each reviewed and up to date or declined. Immunizations reviewed and up to date or declined. Labs ordered and reviewed. Risk factors for depression reviewed and negative. Hearing function and visual acuity are intact. ADLs screened and addressed as needed. Functional ability and level of safety reviewed and appropriate. Education, counseling and referrals performed based on assessed risks today. Patient provided with a copy of personalized plan for preventive  services.   2. Diabetes mellitus treated with oral medication (HCC)  - POC HgB A1c- 6.6. - has improved - Will place him on Mounjaro, sample of 2.5 given. 5 mg sent to pharmacy - metFORMIN (GLUCOPHAGE) 500 MG tablet; Take 1 tablet (500 mg total) by mouth 2 (two) times daily with a meal.  Dispense: 180 tablet; Refill: 0 - glipiZIDE (GLUCOTROL XL) 10 MG 24 hr tablet; Take 1 tablet (10 mg total) by mouth daily with breakfast.  Dispense: 90 tablet; Refill: 0 - Lipid panel;  Future - TSH; Future - CBC; Future - Comprehensive metabolic panel; Future - Microalbumin/Creatinine Ratio, Urine; Future - tirzepatide (MOUNJARO) 5 MG/0.5ML Pen; Inject 5 mg into the skin once a week.  Dispense: 2 mL; Refill: 0 Samples of Mounjaro 2.5 were given to the patient, quantity 4, Lot Number [id number]  3. Essential hypertension - Elevated today. Will have him check at home  - spironolactone (ALDACTONE) 25 MG tablet; Take 2 tablets (50 mg total) by mouth daily.  Dispense: 180 tablet; Refill: 3 - felodipine (PLENDIL) 2.5 MG 24 hr tablet; Take 1 tablet (2.5 mg total) by mouth daily.  Dispense: 90 tablet; Refill: 3 - lisinopril (ZESTRIL) 40 MG tablet; Take 1 tablet (40 mg total) by mouth daily.  Dispense: 90 tablet; Refill: 3 - Lipid panel; Future - TSH; Future - CBC; Future - Comprehensive metabolic panel; Future  4. Chronic systolic heart failure (HCC) - per CHF clinic  - spironolactone (ALDACTONE) 25 MG tablet; Take 2 tablets (50 mg total) by mouth daily.  Dispense: 180 tablet; Refill: 3 - felodipine (PLENDIL) 2.5 MG 24 hr tablet; Take 1 tablet (2.5 mg total) by mouth daily.  Dispense: 90 tablet; Refill: 3 - lisinopril (ZESTRIL) 40 MG tablet; Take 1 tablet (40 mg total) by mouth daily.  Dispense: 90 tablet; Refill: 3 - Lipid panel; Future - TSH; Future - CBC; Future - Comprehensive metabolic panel; Future  5. Mixed hyperlipidemia  - atorvastatin (LIPITOR) 20 MG tablet; Take 1 tablet (20 mg total) by mouth daily.  Dispense: 90 tablet; Refill: 3 - Lipid panel; Future - TSH; Future - CBC; Future - Comprehensive metabolic panel; Future  6. Prostate cancer screening  - PSA; Future  7. Erectile dysfunction due to diseases classified elsewhere   8. 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  Colon cancer screening  - Ambulatory referral to Gastroenterology  Shirline Frees, NP

## 2023-05-19 ENCOUNTER — Other Ambulatory Visit (INDEPENDENT_AMBULATORY_CARE_PROVIDER_SITE_OTHER): Payer: 59

## 2023-05-19 DIAGNOSIS — E119 Type 2 diabetes mellitus without complications: Secondary | ICD-10-CM

## 2023-05-19 DIAGNOSIS — Z7984 Long term (current) use of oral hypoglycemic drugs: Secondary | ICD-10-CM

## 2023-05-19 DIAGNOSIS — Z125 Encounter for screening for malignant neoplasm of prostate: Secondary | ICD-10-CM

## 2023-05-19 DIAGNOSIS — E782 Mixed hyperlipidemia: Secondary | ICD-10-CM

## 2023-05-19 DIAGNOSIS — Z152 Genetic susceptibility to obesity: Secondary | ICD-10-CM

## 2023-05-19 DIAGNOSIS — I10 Essential (primary) hypertension: Secondary | ICD-10-CM | POA: Diagnosis not present

## 2023-05-19 DIAGNOSIS — E8882 Obesity due to disruption of MC4R pathway: Secondary | ICD-10-CM

## 2023-05-19 DIAGNOSIS — I5022 Chronic systolic (congestive) heart failure: Secondary | ICD-10-CM | POA: Diagnosis not present

## 2023-05-19 DIAGNOSIS — E66812 Obesity, class 2: Secondary | ICD-10-CM

## 2023-05-19 DIAGNOSIS — Z6836 Body mass index (BMI) 36.0-36.9, adult: Secondary | ICD-10-CM

## 2023-05-19 LAB — COMPREHENSIVE METABOLIC PANEL
ALT: 20 U/L (ref 0–53)
AST: 19 U/L (ref 0–37)
Albumin: 4.5 g/dL (ref 3.5–5.2)
Alkaline Phosphatase: 39 U/L (ref 39–117)
BUN: 19 mg/dL (ref 6–23)
CO2: 23 meq/L (ref 19–32)
Calcium: 9.3 mg/dL (ref 8.4–10.5)
Chloride: 103 meq/L (ref 96–112)
Creatinine, Ser: 1.02 mg/dL (ref 0.40–1.50)
GFR: 81.55 mL/min (ref >60.00)
Glucose, Bld: 159 mg/dL — ABNORMAL HIGH (ref 70–99)
Potassium: 4.2 meq/L (ref 3.5–5.1)
Sodium: 136 meq/L (ref 135–145)
Total Bilirubin: 0.4 mg/dL (ref 0.2–1.2)
Total Protein: 7.3 g/dL (ref 6.0–8.3)

## 2023-05-19 LAB — LIPID PANEL
Cholesterol: 192 mg/dL (ref 0–200)
HDL: 46.7 mg/dL (ref >39.00)
LDL Cholesterol: 86 mg/dL (ref 0–99)
NonHDL: 145.71
Total CHOL/HDL Ratio: 4
Triglycerides: 299 mg/dL — ABNORMAL HIGH (ref 0.0–149.0)
VLDL: 59.8 mg/dL — ABNORMAL HIGH (ref 0.0–40.0)

## 2023-05-19 LAB — MICROALBUMIN / CREATININE URINE RATIO
Creatinine,U: 350 mg/dL
Microalb Creat Ratio: 3.2 mg/g (ref 0.0–30.0)
Microalb, Ur: 1.1 mg/dL (ref 0.0–1.9)

## 2023-05-19 LAB — CBC
HCT: 42.5 % (ref 39.0–52.0)
Hemoglobin: 14.2 g/dL (ref 13.0–17.0)
MCHC: 33.5 g/dL (ref 30.0–36.0)
MCV: 92.8 fL (ref 78.0–100.0)
Platelets: 216 10*3/uL (ref 150.0–400.0)
RBC: 4.58 Mil/uL (ref 4.22–5.81)
RDW: 14 % (ref 11.5–15.5)
WBC: 4.7 10*3/uL (ref 4.0–10.5)

## 2023-05-20 ENCOUNTER — Encounter: Payer: Self-pay | Admitting: Adult Health

## 2023-05-20 LAB — TSH: TSH: 0.95 u[IU]/mL (ref 0.35–5.50)

## 2023-05-20 LAB — PSA: PSA: 0.59 ng/mL (ref 0.10–4.00)

## 2023-06-09 ENCOUNTER — Other Ambulatory Visit: Payer: Self-pay | Admitting: Adult Health

## 2023-06-24 ENCOUNTER — Telehealth (HOSPITAL_COMMUNITY): Payer: Self-pay | Admitting: Internal Medicine

## 2023-06-29 ENCOUNTER — Ambulatory Visit (AMBULATORY_SURGERY_CENTER)

## 2023-06-29 VITALS — Ht 70.0 in | Wt 235.0 lb

## 2023-06-29 DIAGNOSIS — Z1211 Encounter for screening for malignant neoplasm of colon: Secondary | ICD-10-CM

## 2023-06-29 MED ORDER — NA SULFATE-K SULFATE-MG SULF 17.5-3.13-1.6 GM/177ML PO SOLN: 1.0000 | Freq: Once | ORAL | 0 refills | Status: AC

## 2023-06-29 NOTE — Progress Notes (Signed)

## 2023-06-29 NOTE — Patient Instructions (Signed)
 [facility] has implemented a new process for scheduling procedures.  Please note your arrival time for the [facility] is your appointment time that is shown on your written instructions.  Please do not arrive one hour prior to the time listed in your instructions.  Please ignore any outside notifications to arrive one hour early.  We apologize for any confusion and look forward to seeing you for your procedure.

## 2023-07-02 ENCOUNTER — Other Ambulatory Visit: Payer: Self-pay

## 2023-07-02 ENCOUNTER — Encounter (HOSPITAL_COMMUNITY): Payer: Self-pay | Admitting: *Deleted

## 2023-07-02 ENCOUNTER — Emergency Department (HOSPITAL_COMMUNITY)
Admission: EM | Admit: 2023-07-02 | Discharge: 2023-07-03 | Disposition: A | Discharge status: Home / Self Care | Attending: Emergency Medicine | Admitting: Emergency Medicine

## 2023-07-02 DIAGNOSIS — R079 Chest pain, unspecified: Secondary | ICD-10-CM | POA: Insufficient documentation

## 2023-07-02 DIAGNOSIS — I10 Essential (primary) hypertension: Secondary | ICD-10-CM | POA: Insufficient documentation

## 2023-07-02 DIAGNOSIS — E119 Type 2 diabetes mellitus without complications: Secondary | ICD-10-CM | POA: Diagnosis not present

## 2023-07-02 DIAGNOSIS — R12 Heartburn: Secondary | ICD-10-CM | POA: Diagnosis present

## 2023-07-02 DIAGNOSIS — Z7984 Long term (current) use of oral hypoglycemic drugs: Secondary | ICD-10-CM | POA: Diagnosis not present

## 2023-07-02 DIAGNOSIS — Z7982 Long term (current) use of aspirin: Secondary | ICD-10-CM | POA: Insufficient documentation

## 2023-07-02 DIAGNOSIS — R1013 Epigastric pain: Secondary | ICD-10-CM | POA: Insufficient documentation

## 2023-07-02 DIAGNOSIS — R110 Nausea: Secondary | ICD-10-CM | POA: Insufficient documentation

## 2023-07-02 NOTE — ED Triage Notes (Signed)
 The pt is c/o chest for 3-4 days he is being treated for chf  he felt a knot to the rt lower chest yesterday but that went away

## 2023-07-03 ENCOUNTER — Emergency Department (HOSPITAL_COMMUNITY)

## 2023-07-03 LAB — BASIC METABOLIC PANEL WITH GFR
Anion gap: 11 (ref 5–15)
BUN: 13 mg/dL (ref 6–20)
CO2: 23 mmol/L (ref 22–32)
Calcium: 9.2 mg/dL (ref 8.9–10.3)
Chloride: 102 mmol/L (ref 98–111)
Creatinine, Ser: 0.88 mg/dL (ref 0.61–1.24)
GFR, Estimated: >60 mL/min (ref >60)
Glucose, Bld: 69 mg/dL — ABNORMAL LOW (ref 70–99)
Potassium: 3.8 mmol/L (ref 3.5–5.1)
Sodium: 136 mmol/L (ref 135–145)

## 2023-07-03 LAB — CBC
HCT: 38.9 % — ABNORMAL LOW (ref 39.0–52.0)
Hemoglobin: 13.5 g/dL (ref 13.0–17.0)
MCH: 31.5 pg (ref 26.0–34.0)
MCHC: 34.7 g/dL (ref 30.0–36.0)
MCV: 90.9 fL (ref 80.0–100.0)
Platelets: 230 10*3/uL (ref 150–400)
RBC: 4.28 MIL/uL (ref 4.22–5.81)
RDW: 12.8 % (ref 11.5–15.5)
WBC: 7.5 10*3/uL (ref 4.0–10.5)
nRBC: 0 % (ref 0.0–0.2)

## 2023-07-03 LAB — HEPATIC FUNCTION PANEL
ALT: 18 U/L (ref 0–44)
AST: 21 U/L (ref 15–41)
Albumin: 3.6 g/dL (ref 3.5–5.0)
Alkaline Phosphatase: 30 U/L — ABNORMAL LOW (ref 38–126)
Bilirubin, Direct: 0.2 mg/dL (ref 0.0–0.2)
Indirect Bilirubin: 0.5 mg/dL (ref 0.3–0.9)
Total Bilirubin: 0.7 mg/dL (ref 0.0–1.2)
Total Protein: 6.6 g/dL (ref 6.5–8.1)

## 2023-07-03 LAB — TROPONIN I (HIGH SENSITIVITY)
Troponin I (High Sensitivity): 8 ng/L (ref <18)
Troponin I (High Sensitivity): 8 ng/L (ref <18)

## 2023-07-03 LAB — CBG MONITORING, ED: Glucose-Capillary: 86 mg/dL (ref 70–99)

## 2023-07-03 LAB — LIPASE, BLOOD: Lipase: 35 U/L (ref 11–51)

## 2023-07-03 MED ORDER — PANTOPRAZOLE SODIUM 40 MG IV SOLR
40.0000 mg | Freq: Once | INTRAVENOUS | Status: AC
  Administered 2023-07-03: 40 mg via INTRAVENOUS
  Filled 2023-07-03: qty 10

## 2023-07-03 MED ORDER — PANTOPRAZOLE SODIUM 40 MG PO TBEC: 40.0000 mg | DELAYED_RELEASE_TABLET | Freq: Every day | ORAL | 0 refills | Status: DC

## 2023-07-03 NOTE — ED Provider Notes (Signed)
 Caleb Garcia   CSN: [id number] Arrival date & time: 07/02/23  2327     History  Chief Complaint  Patient presents with   Chest Pain    Caleb Garcia is a 57 y.o. male.  The history is provided by the patient and medical records.  Chest Pain Caleb Garcia is a 58 y.o. male who presents to the Emergency Department complaining of heartburn.  He presents to the emergency department for evaluation of what he describes as heartburn and reflux sensation for the last 2 days.  Symptoms are waxing and waning.  He feels like there is a knot in his right upper quadrant.  He has associated burning across his upper stomach.  He has nausea but no fever, cough, shortness of breath, diaphoresis, vomiting.  He did have a diarrhea that he describes as mild, last episode was yesterday.  No hematochezia or melena.  No dysuria.  He is not on any prescription medications for reflux but he has been taking Tums and Pepto-Bismol.  No history of DVT/PE.  No recent surgeries.  No anticoagulants.   Hx/o HTN, HPL, DM. NICM.  He smokes an occasional cigar.  No alcohol or drug use.  No prior abdominal surgeries.    Home Medications Prior to Admission medications   Medication Sig Start Date End Date Taking? Authorizing Provider  pantoprazole (PROTONIX) 40 MG tablet Take 1 tablet (40 mg total) by mouth daily. 07/03/23  Yes Tilden Fossa, MD  acetaminophen-codeine (TYLENOL #3) 300-30 MG tablet Take 1 tablet by mouth every 6 (six) hours as needed for moderate pain. 06/30/22   Cammy Copa, MD  aspirin 81 MG tablet Take 81 mg by mouth daily.      [provider]  atorvastatin (LIPITOR) 20 MG tablet Take 1 tablet (20 mg total) by mouth daily. 05/17/23   Nafziger, Kandee Keen, NP  carvedilol (COREG) 25 MG tablet Take 1 tablet (25 mg total) by mouth 2 (two) times daily with a meal. 04/16/22   Nafziger, Kandee Keen, NP  Chlorpheniramine-DM (CORICIDIN HBP  COUGH/COLD PO) Take 1 tablet by mouth every 12 (twelve) hours as needed (cold symptoms). Patient not taking: Reported on 06/29/2023    [provider]  empagliflozin (JARDIANCE) 10 MG TABS tablet Take 1 tablet (10 mg total) by mouth daily before breakfast. 08/12/22   Bensimhon, Bevelyn Buckles, MD  felodipine (PLENDIL) 2.5 MG 24 hr tablet Take 1 tablet (2.5 mg total) by mouth daily. 05/17/23   Nafziger, Kandee Keen, NP  glipiZIDE (GLUCOTROL XL) 10 MG 24 hr tablet Take 1 tablet (10 mg total) by mouth daily with breakfast. 05/17/23 08/15/23  Nafziger, Kandee Keen, NP  hydrALAZINE (APRESOLINE) 50 MG tablet Take 1 tablet (50 mg total) by mouth 3 (three) times daily. 07/28/22 06/29/23  Nafziger, Kandee Keen, NP  lisinopril (ZESTRIL) 40 MG tablet Take 1 tablet (40 mg total) by mouth daily. 05/17/23   Nafziger, Kandee Keen, NP  metFORMIN (GLUCOPHAGE) 500 MG tablet Take 1 tablet (500 mg total) by mouth 2 (two) times daily with a meal. 05/17/23   Nafziger, Kandee Keen, NP  potassium chloride SA (KLOR-CON M) 20 MEQ tablet Take 1 tablet (20 mEq total) by mouth as needed. 01/10/23   Milford, Anderson Malta, FNP  spironolactone (ALDACTONE) 25 MG tablet Take 2 tablets (50 mg total) by mouth daily. 05/17/23   Nafziger, Kandee Keen, NP  tadalafil (CIALIS) 20 MG tablet TAKE 1 TABLET BY MOUTH ONCE DAILY AS NEEDED FOR ERECTILE DYSFUNCTION 06/09/23  Nafziger, Kandee Keen, NP      Allergies    Other and Shrimp [shellfish allergy]    Review of Systems   Review of Systems  Cardiovascular:  Positive for chest pain.  All other systems reviewed and are negative.   Physical Exam Updated Vital Signs BP 124/75   Pulse 77   Temp 98.1 F (36.7 C) (Oral)   Resp (!) 21   Ht 5\' 10"  (1.778 m)   Wt 106.6 kg   SpO2 100%   BMI 33.72 kg/m  Physical Exam Vitals and nursing Garcia reviewed.  Constitutional:      Appearance: He is well-developed.  HENT:     Head: Normocephalic and atraumatic.  Cardiovascular:     Rate and Rhythm: Normal rate and regular rhythm.     Heart sounds: No  murmur heard. Pulmonary:     Effort: Pulmonary effort is normal. No respiratory distress.     Breath sounds: Normal breath sounds.  Abdominal:     Palpations: Abdomen is soft.     Tenderness: There is no abdominal tenderness. There is no guarding or rebound.  Musculoskeletal:        General: No swelling or tenderness.     Comments: 2+ DP pulses bilaterally  Skin:    General: Skin is warm and dry.  Neurological:     Mental Status: He is alert and oriented to person, place, and time.  Psychiatric:        Behavior: Behavior normal.     ED Results / Procedures / Treatments   Labs (all labs ordered are listed, but only abnormal results are displayed) Labs Reviewed  BASIC METABOLIC PANEL WITH GFR - Abnormal; Notable for the following components:      Result Value   Glucose, Bld 69 (*)    All other components within normal limits  CBC - Abnormal; Notable for the following components:   HCT 38.9 (*)    All other components within normal limits  HEPATIC FUNCTION PANEL - Abnormal; Notable for the following components:   Alkaline Phosphatase 30 (*)    All other components within normal limits  LIPASE, BLOOD  CBG MONITORING, ED  TROPONIN I (HIGH SENSITIVITY)  TROPONIN I (HIGH SENSITIVITY)    EKG EKG Interpretation Date/Time:  Saturday July 02 2023 23:38:20 EDT Ventricular Rate:  88 PR Interval:  194 QRS Duration:  100 QT Interval:  390 QTC Calculation: 471 R Axis:   0  Text Interpretation: Normal sinus rhythm Inferior infarct , age undetermined T wave abnormality, consider lateral ischemia Abnormal ECG When compared with ECG of 10-Jan-2023 15:29, No significant change was found Confirmed by Tilden Fossa [phone]) on 07/03/2023 12:21:15 AM  Radiology DG Chest 2 View Result Date: 07/03/2023 CLINICAL DATA:  Chest pain for 4 days, history of CHF EXAM: CHEST - 2 VIEW COMPARISON:  Radiograph 03/26/2021 FINDINGS: Stable cardiomediastinal silhouette. No focal consolidation, pleural  effusion, or pneumothorax. No displaced rib fractures. Elevated right hemidiaphragm. IMPRESSION: No active cardiopulmonary disease. Electronically Signed   By: Minerva Fester M.D.   On: 07/03/2023 00:34    Procedures Procedures    Medications Ordered in ED Medications  pantoprazole (PROTONIX) injection 40 mg (40 mg Intravenous Given 07/03/23 0047)    ED Course/ Medical Decision Making/ A&P                                 Medical Decision Making Amount and/or Complexity of  Data Reviewed Labs: ordered.  Risk Prescription drug management.   Pt with hx/o NICM, HTN, DM, HPL here for evaluation of intermittent chest pain for the last two days.  EKG is normal but similar when compared to priors.  Troponins are negative x 2.  CBC, CMP and lipase are unremarkable.  Patient's symptoms resolved after Protonix administration.  Suspect that he may have some reflux contributing to his symptoms.  Current picture is not consistent with ACS, decompensated heart failure, pancreatitis, cholecystitis, perforated viscus.  Discussed with patient home care for chest pain, likely secondary to reflux.  Discussed outpatient follow-up as well as return precautions.        Final Clinical Impression(s) / ED Diagnoses Final diagnoses:  Nonspecific chest pain  Epigastric abdominal pain    Rx / DC Orders ED Discharge Orders          Ordered    pantoprazole (PROTONIX) 40 MG tablet  Daily        07/03/23 0356              Tilden Fossa, MD 07/03/23 [phone]

## 2023-07-03 NOTE — ED Notes (Signed)
 Pt transported to xray

## 2023-07-06 ENCOUNTER — Encounter: Payer: Self-pay | Admitting: Gastroenterology

## 2023-07-11 ENCOUNTER — Telehealth: Payer: Self-pay | Admitting: Gastroenterology

## 2023-07-11 ENCOUNTER — Encounter: Admitting: Gastroenterology

## 2023-07-11 NOTE — Telephone Encounter (Signed)
 Good Morning Dr.Cirigliano,  I called this patient at 7:15am to see if he was coming for his procedure.  He responed "NO" to the automated reminder.  He is experiencing acid reflex.  He went to the ER on 07-02-23 for it and would like to reschedule.   He will call back to reschedule.

## 2023-07-14 NOTE — Progress Notes (Unsigned)
 Subjective:    Patient ID: Caleb Garcia, male    DOB: 24-Sep-1965, 58 y.o.   MRN: [id number]  HPI 58 year old male who  has a past medical history of Arthritis, Cardiomyopathy, CHF (congestive heart failure) (HCC), Diabetes mellitus without complication (HCC), HTN (hypertension), Hyperlipidemia, Hypokalemia, Obesity, OSA (obstructive sleep apnea), Sleep apnea, Stroke (HCC) (10/2008), and Systolic CHF (HCC).  Presents to the office today for 24-month follow-up regarding diabetes/hypertension/obesity.  Diabetes mellitus type 2-managed with metformin 500 mg twice daily, glipizide 10 mg XR and Jardiance 10 mg daily ( prescribed by Cardiology for CHF).  2 months ago he was started on Mounjaro 2.5 with titration to 5 mg weekly a month later. He reports today that he picked up the 5 mg dose but never started it because he was confused about taking the 2.5 mg dose and the 5 mg dose.   He does not monitor his blood sugars at home on a routine basis.  Denies episodes of hypoglycemia. He does stay active at work  Lab Results  Component Value Date   HGBA1C 6.6 (A) 05/17/2023   HGBA1C 7.2 (A) 07/28/2022   HGBA1C 8.8 (H) 04/23/2022   Wt Readings from Last 3 Encounters:  07/15/23 250 lb (113.4 kg)  07/02/23 235 lb 0.2 oz (106.6 kg)  06/29/23 235 lb (106.6 kg)   HTN -managed with lisinopril 40 mg daily, hydralazine 25 mg 3 times daily, Plendil 2.5 mg daily, Spirolactone 25 mg daily, and Coreg 25 mg daily.  He denies dizziness, lightheadedness, chest pain, or shortness of breath.  BP Readings from Last 3 Encounters:  07/15/23 (!) 140/94  07/03/23 124/75  05/17/23 (!) 142/100      Review of Systems See HPI   Past Medical History:  Diagnosis Date   Arthritis    Cardiomyopathy    Non-ischemic   CHF (congestive heart failure) (HCC)    Diabetes mellitus without complication (HCC)    HTN (hypertension)    Hyperlipidemia    Hypokalemia    Obesity    OSA (obstructive sleep apnea)    Sleep  apnea    had surgery- no cpap   Stroke (HCC) 10/2008   Systolic CHF (HCC)     Social History   Socioeconomic History   Marital status: Married    Spouse name: Not on file   Number of children: Not on file   Years of education: Not on file   Highest education level: Not on file  Occupational History    Employer: UNEMPLOYED  Tobacco Use   Smoking status: Some Days    Types: Cigars   Smokeless tobacco: Never  Vaping Use   Vaping status: Never Used  Substance and Sexual Activity   Alcohol use: No   Drug use: No   Sexual activity: Not on file  Other Topics Concern   Not on file  Social History Narrative   Married 6 years   2 biologic daughters   2 stepsons, one Programme researcher, broadcasting/film/video for [employer]    Social Drivers of Health   Financial Resource Strain: Not on file  Food Insecurity: No Food Insecurity (03/01/2022)   Hunger Vital Sign    Worried About Running Out of Food in the Last Year: Never true    Ran Out of Food in the Last Year: Never true  Transportation Needs: No Transportation Needs (03/08/2022)   PRAPARE - Administrator, Civil Service (Medical): No    Lack of Transportation (  Non-Medical): No  Physical Activity: Not on file  Stress: Not on file  Social Connections: Not on file  Intimate Partner Violence: Not on file    Past Surgical History:  Procedure Laterality Date   avulsion fracture of left hip     CARDIAC CATHETERIZATION  10/08/08   LEFT HEART CATH AND CORONARY ANGIOGRAPHY N/A 08/03/2016   Procedure: Left Heart Cath and Coronary Angiography;  Surgeon: Dolores Patty, MD;  Location: [facility] INVASIVE CV LAB;  Service: Cardiovascular;  Laterality: N/A;   TONSILLECTOMY     TOTAL KNEE ARTHROPLASTY Right 04/21/2021   Procedure: RIGHT TOTAL KNEE ARTHROPLASTY;  Surgeon: Cammy Copa, MD;  Location: [facility] OR;  Service: Orthopedics;  Laterality: Right;   UVULOPALATOPHARYNGOPLASTY      Family History  Problem Relation Age of Onset   Diabetes  Mother    Hypertension Mother    Heart disease Mother    Cancer Father        Throat cancer   Stroke Father    Stroke Brother 32   Prostate cancer Neg Hx    Colon cancer Neg Hx    Colon polyps Neg Hx    Esophageal cancer Neg Hx    Rectal cancer Neg Hx    Stomach cancer Neg Hx     Allergies  Allergen Reactions   Other     Almonds-caused swelling in hands   Shrimp [Shellfish Allergy] Swelling    Current Outpatient Medications on File Prior to Visit  Medication Sig Dispense Refill   acetaminophen-codeine (TYLENOL #3) 300-30 MG tablet Take 1 tablet by mouth every 6 (six) hours as needed for moderate pain. 30 tablet 0   aspirin 81 MG tablet Take 81 mg by mouth daily.       atorvastatin (LIPITOR) 20 MG tablet Take 1 tablet (20 mg total) by mouth daily. 90 tablet 3   Chlorpheniramine-DM (CORICIDIN HBP COUGH/COLD PO) Take 1 tablet by mouth every 12 (twelve) hours as needed (cold symptoms).     empagliflozin (JARDIANCE) 10 MG TABS tablet Take 1 tablet (10 mg total) by mouth daily before breakfast. 90 tablet 3   felodipine (PLENDIL) 2.5 MG 24 hr tablet Take 1 tablet (2.5 mg total) by mouth daily. 90 tablet 3   glipiZIDE (GLUCOTROL XL) 10 MG 24 hr tablet Take 1 tablet (10 mg total) by mouth daily with breakfast. 90 tablet 0   metFORMIN (GLUCOPHAGE) 500 MG tablet Take 1 tablet (500 mg total) by mouth 2 (two) times daily with a meal. 180 tablet 0   pantoprazole (PROTONIX) 40 MG tablet Take 1 tablet (40 mg total) by mouth daily. 14 tablet 0   potassium chloride SA (KLOR-CON M) 20 MEQ tablet Take 1 tablet (20 mEq total) by mouth as needed. 30 tablet 6   tadalafil (CIALIS) 20 MG tablet TAKE 1 TABLET BY MOUTH ONCE DAILY AS NEEDED FOR ERECTILE DYSFUNCTION 10 tablet 0   carvedilol (COREG) 25 MG tablet Take 1 tablet (25 mg total) by mouth 2 (two) times daily with a meal. (Patient not taking: Reported on 07/15/2023) 180 tablet 3   hydrALAZINE (APRESOLINE) 50 MG tablet Take 1 tablet (50 mg total) by  mouth 3 (three) times daily. 270 tablet 1   lisinopril (ZESTRIL) 40 MG tablet Take 1 tablet (40 mg total) by mouth daily. (Patient not taking: Reported on 07/15/2023) 90 tablet 3   spironolactone (ALDACTONE) 25 MG tablet Take 2 tablets (50 mg total) by mouth daily. (Patient not taking: Reported on 07/15/2023)  180 tablet 3   No current facility-administered medications on file prior to visit.    BP (!) 140/94   Pulse 91   Temp 98.1 F (36.7 C) (Oral)   Ht 5\' 10"  (1.778 m)   Wt 250 lb (113.4 kg)   SpO2 96%   BMI 35.87 kg/m       Objective:   Physical Exam Vitals and nursing note reviewed.  Constitutional:      Appearance: Normal appearance.  Cardiovascular:     Rate and Rhythm: Normal rate and regular rhythm.     Pulses: Normal pulses.     Heart sounds: Normal heart sounds.  Pulmonary:     Effort: Pulmonary effort is normal.     Breath sounds: Normal breath sounds.  Musculoskeletal:        General: Normal range of motion.  Skin:    General: Skin is warm and dry.  Neurological:     General: No focal deficit present.     Mental Status: He is alert and oriented to person, place, and time.  Psychiatric:        Mood and Affect: Mood normal.        Behavior: Behavior normal.        Thought Content: Thought content normal.        Judgment: Judgment normal.           Assessment & Plan:  1. Long-term current use of injectable noninsulin antidiabetic medication (Primary) - Explained how to take Mounjaro, start with 2.5 mg weekly x 4 weeks and then increase to 5 mg weekly x 4 weeks.  - Follow up in 2 months   2. Essential hypertension - Not at goal today. He forgot to take his medication  - Advised to take medication every day  Shirline Frees, NP

## 2023-07-15 ENCOUNTER — Encounter: Payer: Self-pay | Admitting: Adult Health

## 2023-07-15 ENCOUNTER — Ambulatory Visit: Payer: 59 | Admitting: Adult Health

## 2023-07-15 VITALS — BP 140/94 | HR 91 | Temp 98.1°F | Ht 70.0 in | Wt 250.0 lb

## 2023-07-15 DIAGNOSIS — Z7985 Long-term (current) use of injectable non-insulin antidiabetic drugs: Secondary | ICD-10-CM

## 2023-07-15 DIAGNOSIS — I10 Essential (primary) hypertension: Secondary | ICD-10-CM | POA: Diagnosis not present

## 2023-07-15 DIAGNOSIS — E119 Type 2 diabetes mellitus without complications: Secondary | ICD-10-CM

## 2023-08-04 ENCOUNTER — Other Ambulatory Visit: Payer: Self-pay | Admitting: Adult Health

## 2023-08-04 DIAGNOSIS — I10 Essential (primary) hypertension: Secondary | ICD-10-CM

## 2023-08-04 DIAGNOSIS — I5022 Chronic systolic (congestive) heart failure: Secondary | ICD-10-CM

## 2023-08-08 ENCOUNTER — Telehealth (HOSPITAL_COMMUNITY): Payer: Self-pay | Admitting: Internal Medicine

## 2023-08-08 NOTE — Telephone Encounter (Signed)
 Called to confirm/remind patient of their appointment at the [facility] on 08/08/2023.   Appointment:   [x] Confirmed  [] Left mess   [] No answer/No voice mail  [] VM Full/unable to leave message  [] Phone not in service  Patient reminded to bring all medications and/or complete list.  Confirmed patient has transportation. Gave directions, instructed to utilize valet parking.

## 2023-08-09 ENCOUNTER — Ambulatory Visit (HOSPITAL_COMMUNITY)
Admission: RE | Admit: 2023-08-09 | Discharge: 2023-08-09 | Disposition: A | Discharge status: Home / Self Care | Source: Ambulatory Visit | Attending: Internal Medicine | Admitting: Internal Medicine

## 2023-08-09 ENCOUNTER — Encounter (HOSPITAL_COMMUNITY): Payer: Self-pay | Admitting: Internal Medicine

## 2023-08-09 VITALS — BP 120/80 | HR 78 | Wt 247.8 lb

## 2023-08-09 DIAGNOSIS — Z7984 Long term (current) use of oral hypoglycemic drugs: Secondary | ICD-10-CM | POA: Diagnosis not present

## 2023-08-09 DIAGNOSIS — Z7985 Long-term (current) use of injectable non-insulin antidiabetic drugs: Secondary | ICD-10-CM | POA: Insufficient documentation

## 2023-08-09 DIAGNOSIS — I5022 Chronic systolic (congestive) heart failure: Secondary | ICD-10-CM | POA: Insufficient documentation

## 2023-08-09 DIAGNOSIS — E669 Obesity, unspecified: Secondary | ICD-10-CM | POA: Insufficient documentation

## 2023-08-09 DIAGNOSIS — I110 Hypertensive heart disease with heart failure: Secondary | ICD-10-CM | POA: Insufficient documentation

## 2023-08-09 DIAGNOSIS — G4733 Obstructive sleep apnea (adult) (pediatric): Secondary | ICD-10-CM | POA: Insufficient documentation

## 2023-08-09 DIAGNOSIS — I10 Essential (primary) hypertension: Secondary | ICD-10-CM

## 2023-08-09 DIAGNOSIS — Z79899 Other long term (current) drug therapy: Secondary | ICD-10-CM | POA: Diagnosis not present

## 2023-08-09 DIAGNOSIS — I428 Other cardiomyopathies: Secondary | ICD-10-CM | POA: Diagnosis not present

## 2023-08-09 DIAGNOSIS — Z8673 Personal history of transient ischemic attack (TIA), and cerebral infarction without residual deficits: Secondary | ICD-10-CM | POA: Insufficient documentation

## 2023-08-09 DIAGNOSIS — Z6835 Body mass index (BMI) 35.0-35.9, adult: Secondary | ICD-10-CM | POA: Insufficient documentation

## 2023-08-09 MED ORDER — ENTRESTO 97-103 MG PO TABS: 1.0000 | ORAL_TABLET | Freq: Two times a day (BID) | ORAL | 11 refills | Status: DC

## 2023-08-09 NOTE — Patient Instructions (Signed)
 Great to see you today!!!  STOP Lisinopril   START Entresto 97/103 mg Twice daily   Your physician recommends that you schedule a follow-up appointment in: 9 months with an echocardiogram  If you have any questions or concerns before your next appointment please send us  a message through [facility] or call our office at [phone].    TO LEAVE A MESSAGE FOR THE NURSE SELECT OPTION 2, PLEASE LEAVE A MESSAGE INCLUDING: YOUR NAME DATE OF BIRTH CALL BACK NUMBER REASON FOR CALL**this is important as we prioritize the call backs  YOU WILL RECEIVE A CALL BACK THE SAME DAY AS LONG AS YOU CALL BEFORE 4:00 PM  At the [facility], you and your health needs are our priority. As part of our continuing mission to provide you with exceptional heart care, we have created designated Provider Care Teams. These Care Teams include your primary Cardiologist (physician) and Advanced Practice Providers (APPs- Physician Assistants and Nurse Practitioners) who all work together to provide you with the care you need, when you need it.   You may see any of the following providers on your designated Care Team at your next follow up: Dr Jules Oar Dr Peder Bourdon Dr. Alwin Baars Dr. Arta Lark Amy Marijane Shoulders, NP Ruddy Corral, [place] [facility] [place], [place] Dennise Fitz, NP [place] Lee, NP Shawnee Dellen, NP Luster Salters, PharmD Bevely Brush, PharmD   Please be sure to bring in all your medications bottles to every appointment.    Thank you for choosing [facility]

## 2023-08-09 NOTE — Progress Notes (Addendum)
 ADVANCED HF CLINIC NOTE   Primary Care: Alto Atta, NP HF Cardiologist: Dr. Earla Glassman  Reason for Visit: F/u for chronic systolic heart failure   HPI: Caleb Garcia is a 58 y.o.male with h/o HTN, obesity, OSA s/p UPPP, ? Familial NICM cardiomyopathy (suspect hypertensive), previous occipital stroke, and chronic systolic HF LVEF 16-10% by Echo 06/2014. We last saw him in 4/18 and he is referred back by Alto Atta for f/u on his HF.   Cardiac catheterization in 7/10 EF 25-30% no CAD. Previously told he had very mild OSA with AHI 8. Saw Dr. Bennetta Braun and decided to keep off CPAP for the time being.    Was in [place], [place] and moved back 08/2021. We saw him for first time in 5/24.   Echo 5/24 EF 35-40%  cMRI 10/24 LVEF34%, RVEF 47%, nonspecific inferior RV insertion site LGE. + focal subendocardial mid-anterolateral LGE suggests possible small area of myocardial infarction and mildly elevated ECV at 31%.   He presents today for routine f/u. Doing well. Reports stable NYHA Class I symptoms. Still works as a Naval architect. Wt stable since last visit. Only takes Lasix  PRN and does not have to use regularly. No LEE/ orthopnea/PND. BP well controlled. Reports full med compliance. No side effects. Denies palpitations. No syncope/ near syncope.   His PCP just started him on Mounjaro. Tolerating well thus far.   Cardiac Studies - Echo 5/24 EF 35-40%   - Echo 3/16: EF 45-50%  - Echo 1/15: EF 35-40%  - Echo 11/10: EF 35-40%   - Echo 7/10: EF 25-30%   - Cath 7/10: no CAD   SH: Lives at home with wife. Has alcohol on occasion. No drug use. No smoking  FH: Brother has HTN        Mom kidney transplant, Dad throat cancer  Past Medical History:  Diagnosis Date   Arthritis    Cardiomyopathy    Non-ischemic   CHF (congestive heart failure) (HCC)    Diabetes mellitus without complication (HCC)    HTN (hypertension)    Hyperlipidemia    Hypokalemia    Obesity    OSA  (obstructive sleep apnea)    Sleep apnea    had surgery- no cpap   Stroke (HCC) 10/2008   Systolic CHF (HCC)    Current Outpatient Medications  Medication Sig Dispense Refill   acetaminophen -codeine  (TYLENOL  #3) 300-30 MG tablet Take 1 tablet by mouth every 6 (six) hours as needed for moderate pain. 30 tablet 0   aspirin  81 MG tablet Take 81 mg by mouth daily.       atorvastatin  (LIPITOR) 20 MG tablet Take 1 tablet (20 mg total) by mouth daily. 90 tablet 3   carvedilol  (COREG ) 25 MG tablet TAKE 1 TABLET BY MOUTH TWICE DAILY WITH A MEAL 60 tablet 0   empagliflozin  (JARDIANCE ) 10 MG TABS tablet Take 1 tablet (10 mg total) by mouth daily before breakfast. 90 tablet 3   felodipine  (PLENDIL ) 2.5 MG 24 hr tablet Take 1 tablet (2.5 mg total) by mouth daily. 90 tablet 3   glipiZIDE  (GLUCOTROL  XL) 10 MG 24 hr tablet Take 1 tablet (10 mg total) by mouth daily with breakfast. 90 tablet 0   hydrALAZINE  (APRESOLINE ) 50 MG tablet TAKE 1 TABLET BY MOUTH THREE TIMES DAILY 90 tablet 0   lisinopril  (ZESTRIL ) 40 MG tablet Take 1 tablet (40 mg total) by mouth daily. 90 tablet 3   metFORMIN  (GLUCOPHAGE ) 500 MG tablet Take  1 tablet (500 mg total) by mouth 2 (two) times daily with a meal. 180 tablet 0   pantoprazole  (PROTONIX ) 40 MG tablet Take 1 tablet (40 mg total) by mouth daily. 14 tablet 0   potassium chloride  SA (KLOR-CON  M) 20 MEQ tablet Take 1 tablet (20 mEq total) by mouth as needed. 30 tablet 6   spironolactone  (ALDACTONE ) 25 MG tablet Take 2 tablets (50 mg total) by mouth daily. 180 tablet 3   tadalafil  (CIALIS ) 20 MG tablet TAKE 1 TABLET BY MOUTH ONCE DAILY AS NEEDED FOR ERECTILE DYSFUNCTION 10 tablet 0   Chlorpheniramine-DM (CORICIDIN HBP COUGH/COLD PO) Take 1 tablet by mouth every 12 (twelve) hours as needed (cold symptoms). (Patient not taking: Reported on 08/09/2023)     No current facility-administered medications for this encounter.   Allergies  Allergen Reactions   Other     Almonds-caused  swelling in hands   Shrimp [Shellfish Allergy] Swelling   Social History   Socioeconomic History   Marital status: Married    Spouse name: Not on file   Number of children: Not on file   Years of education: Not on file   Highest education level: Not on file  Occupational History    Employer: UNEMPLOYED  Tobacco Use   Smoking status: Some Days    Types: Cigars   Smokeless tobacco: Never  Vaping Use   Vaping status: Never Used  Substance and Sexual Activity   Alcohol use: No   Drug use: No   Sexual activity: Not on file  Other Topics Concern   Not on file  Social History Narrative   Married 6 years   2 biologic daughters   2 stepsons, one Programme researcher, broadcasting/film/video for [employer]    Social Drivers of Health   Financial Resource Strain: Not on file  Food Insecurity: No Food Insecurity (03/01/2022)   Hunger Vital Sign    Worried About Running Out of Food in the Last Year: Never true    Ran Out of Food in the Last Year: Never true  Transportation Needs: No Transportation Needs (03/08/2022)   PRAPARE - Administrator, Civil Service (Medical): No    Lack of Transportation (Non-Medical): No  Physical Activity: Not on file  Stress: Not on file  Social Connections: Not on file  Intimate Partner Violence: Not on file    Family History  Problem Relation Age of Onset   Diabetes Mother    Hypertension Mother    Heart disease Mother    Cancer Father        Throat cancer   Stroke Father    Stroke Brother 35   Prostate cancer Neg Hx    Colon cancer Neg Hx    Colon polyps Neg Hx    Esophageal cancer Neg Hx    Rectal cancer Neg Hx    Stomach cancer Neg Hx    BP 120/80   Pulse 78   Wt 112.4 kg (247 lb 12.8 oz)   SpO2 98%   BMI 35.56 kg/m   Wt Readings from Last 3 Encounters:  08/09/23 112.4 kg (247 lb 12.8 oz)  07/15/23 113.4 kg (250 lb)  07/02/23 106.6 kg (235 lb 0.2 oz)   PHYSICAL EXAM: General:  Well appearing, mod obese. No respiratory  difficulty HEENT: normal Neck: supple. no JVD. Carotids 2+ bilat; no bruits. No lymphadenopathy or thyromegaly appreciated. Cor: PMI nondisplaced. Regular rate & rhythm. No rubs, gallops or murmurs. Lungs: clear Abdomen: soft,  nontender, nondistended. No hepatosplenomegaly. No bruits or masses. Good bowel sounds. Extremities: no cyanosis, clubbing, rash, edema Neuro: alert & oriented x 3, cranial nerves grossly intact. moves all 4 extremities w/o difficulty. Affect pleasant.  ECG: not performed   ASSESSMENT & PLAN:  1) Chronic systolic HF:  - NICM - Cardiac cath 7/10: no CAD, EF 25-30% - Echo 3/16: EF 45-50%, RV normal.  - POCUS 08/12/22: EF 30-35%  - Echo 5/24: EF 35-40% - cMRI 10/24 LVEF 34%, RVEF 47%, nonspecific inferior RV insertion site LGE. + focal subendocardial mid-anterolateral LGE suggests possible small area of myocardial infarction and mildly elevated ECV at 31%. - NYHA I, NICM. Does not qualify for ICD - Euvolemic on exam. Continue Lasix  PRN - Continue Jardiance  10 mg daily. Tolerating ok. No GU side effects  - Continue Coreg  25 mg bid - Continue lisinopril  40 mg daily. - Continue hydralazine  50 mg tid - Continue spiro 50 mg daily - Check BMP today   2) HTN - controlled on current regimen - continue GDMT per above   3) OSA  - Mild. Has not wanted CPAP - just start Mounjaro   4) Obesity - Body mass index is 35.56 kg/m. - started Mounjaro 4/25, tolerating ok. PCP managing    Ruddy Corral, PA-C  12:21 PM  Patient seen and examined with the above-signed Advanced Practice Provider and/or Housestaff. I personally reviewed laboratory data, imaging studies and relevant notes. I independently examined the patient and formulated the important aspects of the plan. I have edited the note to reflect any of my changes or salient points. I have personally discussed the plan with the patient and/or family.  58 y/o male with systolic HF due to NICM. Doing well. NYHA  I.  Recent cMRI reviewed with him EF 34% NICM pattern.   Recently started on Mounjaro  General:  Well appearing. No resp difficulty HEENT: normal Neck: supple. no JVD. Carotids 2+ bilat; no bruits. No lymphadenopathy or thryomegaly appreciated. Cor: PMI nondisplaced. Regular rate & rhythm. No rubs, gallops or murmurs. Lungs: clear Abdomen: obese soft, nontender, nondistended. No hepatosplenomegaly. No bruits or masses. Good bowel sounds. Extremities: no cyanosis, clubbing, rash, edema Neuro: alert & orientedx3, cranial nerves grossly intact. moves all 4 extremities w/o difficulty. Affect pleasant  Doing well. NYHA. EF 34% On 4-drug GDMT. Will switch lisinopril  to Entresto. Not ICD candidate at this point with NYHA I symptoms.   Repeat echo 9 months  Jules Oar, MD  5:38 PM

## 2023-09-02 ENCOUNTER — Telehealth: Payer: Self-pay | Admitting: Pharmacy Technician

## 2023-09-02 NOTE — Telephone Encounter (Addendum)
 Pharmacy Patient Advocate Encounter   Received notification from CoverMyMeds that prior authorization for Entresto  is required/requested.   Insurance verification completed.   The patient is insured through [facility] .   Per test claim: PA required; PA submitted to above mentioned insurance via CoverMyMeds Key/confirmation #/EOC [id number] Status is pending

## 2023-09-06 ENCOUNTER — Encounter: Payer: Self-pay | Admitting: Adult Health

## 2023-09-06 ENCOUNTER — Ambulatory Visit: Admitting: Adult Health

## 2023-09-06 VITALS — BP 110/70 | HR 84 | Temp 98.3°F | Ht 70.0 in | Wt 244.0 lb

## 2023-09-06 DIAGNOSIS — Z7984 Long term (current) use of oral hypoglycemic drugs: Secondary | ICD-10-CM

## 2023-09-06 DIAGNOSIS — I10 Essential (primary) hypertension: Secondary | ICD-10-CM | POA: Diagnosis not present

## 2023-09-06 DIAGNOSIS — E66811 Obesity, class 1: Secondary | ICD-10-CM

## 2023-09-06 DIAGNOSIS — I5022 Chronic systolic (congestive) heart failure: Secondary | ICD-10-CM | POA: Diagnosis not present

## 2023-09-06 DIAGNOSIS — E119 Type 2 diabetes mellitus without complications: Secondary | ICD-10-CM | POA: Diagnosis not present

## 2023-09-06 DIAGNOSIS — Z6835 Body mass index (BMI) 35.0-35.9, adult: Secondary | ICD-10-CM

## 2023-09-06 DIAGNOSIS — Z7985 Long-term (current) use of injectable non-insulin antidiabetic drugs: Secondary | ICD-10-CM

## 2023-09-06 LAB — POCT GLYCOSYLATED HEMOGLOBIN (HGB A1C): Hemoglobin A1C: 6.1 % — AB (ref 4.0–5.6)

## 2023-09-06 MED ORDER — CARVEDILOL 25 MG PO TABS: 25.0000 mg | ORAL_TABLET | Freq: Two times a day (BID) | ORAL | 3 refills | Status: AC

## 2023-09-06 MED ORDER — TIRZEPATIDE 7.5 MG/0.5ML ~~LOC~~ SOAJ: 7.5000 mg | SUBCUTANEOUS | 0 refills | Status: DC

## 2023-09-06 MED ORDER — METFORMIN HCL 500 MG PO TABS: 500.0000 mg | ORAL_TABLET | Freq: Two times a day (BID) | ORAL | 0 refills | Status: DC

## 2023-09-06 NOTE — Progress Notes (Signed)
 Subjective:    Patient ID: Caleb Garcia, male    DOB: Sep 18, 1965, 58 y.o.   MRN: [id number]  HPI  58 year old male who  has a past medical history of Arthritis, Cardiomyopathy, CHF (congestive heart failure) (HCC), Diabetes mellitus without complication (HCC), HTN (hypertension), Hyperlipidemia, Hypokalemia, Obesity, OSA (obstructive sleep apnea), Sleep apnea, Stroke (HCC) (10/2008), and Systolic CHF (HCC).  Presents to the office today for 74-month follow-up regarding diabetes/hypertension/obesity.  Diabetes mellitus type 2-managed with metformin  500 mg twice daily, glipizide  10 mg XR and Jardiance  10 mg daily ( prescribed by Cardiology for CHF). 1 month ago he started Mounjaro 5 mg weekly. He has been tolerating this medication well and has not had any side effects such as nausea, vomiting or constipation. He has greatly reduced his portion size. He has started to notice his clothes fitting looser.  Lab Results  Component Value Date   HGBA1C 6.6 (A) 05/17/2023   HGBA1C 7.2 (A) 07/28/2022   HGBA1C 8.8 (H) 04/23/2022   Wt Readings from Last 3 Encounters:  09/06/23 244 lb (110.7 kg)  08/09/23 247 lb 12.8 oz (112.4 kg)  07/15/23 250 lb (113.4 kg)   HTN -managed with lisinopril  40 mg daily, hydralazine  25 mg 3 times daily, Plendil  2.5 mg daily, Spirolactone 25 mg daily, and Coreg  25 mg daily.  He denies dizziness, lightheadedness, chest pain, or shortness of breath. Cardiology is trying to get Entresto  approved for him.  BP Readings from Last 3 Encounters:  09/06/23 110/70  08/09/23 120/80  07/15/23 (!) 140/94    Review of Systems See HPI   Past Medical History:  Diagnosis Date   Arthritis    Cardiomyopathy    Non-ischemic   CHF (congestive heart failure) (HCC)    Diabetes mellitus without complication (HCC)    HTN (hypertension)    Hyperlipidemia    Hypokalemia    Obesity    OSA (obstructive sleep apnea)    Sleep apnea    had surgery- no cpap   Stroke (HCC) 10/2008    Systolic CHF (HCC)     Social History   Socioeconomic History   Marital status: Married    Spouse name: Not on file   Number of children: Not on file   Years of education: Not on file   Highest education level: Not on file  Occupational History    Employer: UNEMPLOYED  Tobacco Use   Smoking status: Some Days    Types: Cigars   Smokeless tobacco: Never  Vaping Use   Vaping status: Never Used  Substance and Sexual Activity   Alcohol use: No   Drug use: No   Sexual activity: Not on file  Other Topics Concern   Not on file  Social History Narrative   Married 6 years   2 biologic daughters   2 stepsons, one Programme researcher, broadcasting/film/video for [employer]    Social Drivers of Health   Financial Resource Strain: Not on file  Food Insecurity: No Food Insecurity (03/01/2022)   Hunger Vital Sign    Worried About Running Out of Food in the Last Year: Never true    Ran Out of Food in the Last Year: Never true  Transportation Needs: No Transportation Needs (03/08/2022)   PRAPARE - Administrator, Civil Service (Medical): No    Lack of Transportation (Non-Medical): No  Physical Activity: Not on file  Stress: Not on file  Social Connections: Not on file  Intimate Partner Violence:  Not on file    Past Surgical History:  Procedure Laterality Date   avulsion fracture of left hip     CARDIAC CATHETERIZATION  10/08/08   LEFT HEART CATH AND CORONARY ANGIOGRAPHY N/A 08/03/2016   Procedure: Left Heart Cath and Coronary Angiography;  Surgeon: Mardell Shade, MD;  Location: [facility] INVASIVE CV LAB;  Service: Cardiovascular;  Laterality: N/A;   TONSILLECTOMY     TOTAL KNEE ARTHROPLASTY Right 04/21/2021   Procedure: RIGHT TOTAL KNEE ARTHROPLASTY;  Surgeon: Jasmine Mesi, MD;  Location: [facility] OR;  Service: Orthopedics;  Laterality: Right;   UVULOPALATOPHARYNGOPLASTY      Family History  Problem Relation Age of Onset   Diabetes Mother    Hypertension Mother    Heart disease Mother     Cancer Father        Throat cancer   Stroke Father    Stroke Brother 35   Prostate cancer Neg Hx    Colon cancer Neg Hx    Colon polyps Neg Hx    Esophageal cancer Neg Hx    Rectal cancer Neg Hx    Stomach cancer Neg Hx     Allergies  Allergen Reactions   Other     Almonds-caused swelling in hands   Shrimp [Shellfish Allergy] Swelling    Current Outpatient Medications on File Prior to Visit  Medication Sig Dispense Refill   acetaminophen -codeine  (TYLENOL  #3) 300-30 MG tablet Take 1 tablet by mouth every 6 (six) hours as needed for moderate pain. 30 tablet 0   aspirin  81 MG tablet Take 81 mg by mouth daily.       atorvastatin  (LIPITOR) 20 MG tablet Take 1 tablet (20 mg total) by mouth daily. 90 tablet 3   carvedilol  (COREG ) 25 MG tablet TAKE 1 TABLET BY MOUTH TWICE DAILY WITH A MEAL 60 tablet 0   Chlorpheniramine-DM (CORICIDIN HBP COUGH/COLD PO) Take 1 tablet by mouth every 12 (twelve) hours as needed (cold symptoms).     empagliflozin  (JARDIANCE ) 10 MG TABS tablet Take 1 tablet (10 mg total) by mouth daily before breakfast. 90 tablet 3   felodipine  (PLENDIL ) 2.5 MG 24 hr tablet Take 1 tablet (2.5 mg total) by mouth daily. 90 tablet 3   hydrALAZINE  (APRESOLINE ) 50 MG tablet TAKE 1 TABLET BY MOUTH THREE TIMES DAILY 90 tablet 0   lisinopril  (ZESTRIL ) 40 MG tablet Take 40 mg by mouth daily.     metFORMIN  (GLUCOPHAGE ) 500 MG tablet Take 1 tablet (500 mg total) by mouth 2 (two) times daily with a meal. 180 tablet 0   pantoprazole  (PROTONIX ) 40 MG tablet Take 1 tablet (40 mg total) by mouth daily. 14 tablet 0   potassium chloride  SA (KLOR-CON  M) 20 MEQ tablet Take 1 tablet (20 mEq total) by mouth as needed. 30 tablet 6   sacubitril-valsartan (ENTRESTO ) 97-103 MG Take 1 tablet by mouth 2 (two) times daily. 60 tablet 11   spironolactone  (ALDACTONE ) 25 MG tablet Take 2 tablets (50 mg total) by mouth daily. 180 tablet 3   tadalafil  (CIALIS ) 20 MG tablet TAKE 1 TABLET BY MOUTH ONCE DAILY AS  NEEDED FOR ERECTILE DYSFUNCTION 10 tablet 0   tirzepatide (MOUNJARO) 2.5 MG/0.5ML Pen Inject 2.5 mg into the skin once a week.     glipiZIDE  (GLUCOTROL  XL) 10 MG 24 hr tablet Take 1 tablet (10 mg total) by mouth daily with breakfast. 90 tablet 0   No current facility-administered medications on file prior to visit.    BP  110/70   Pulse 84   Temp 98.3 F (36.8 C) (Oral)   Ht 5\' 10"  (1.778 m)   Wt 244 lb (110.7 kg)   SpO2 98%   BMI 35.01 kg/m       Objective:   Physical Exam Vitals and nursing note reviewed.  Constitutional:      Appearance: Normal appearance. He is obese.  Cardiovascular:     Rate and Rhythm: Normal rate and regular rhythm.     Pulses: Normal pulses.     Heart sounds: Normal heart sounds.  Pulmonary:     Effort: Pulmonary effort is normal.     Breath sounds: Normal breath sounds.  Skin:    General: Skin is warm and dry.  Neurological:     General: No focal deficit present.     Mental Status: He is alert and oriented to person, place, and time.  Psychiatric:        Mood and Affect: Mood normal.        Behavior: Behavior normal.        Thought Content: Thought content normal.        Judgment: Judgment normal.        Assessment & Plan:  1. Diabetes mellitus treated with oral medication (HCC) (Primary)  - POC HgB A1c- 6.1  - Will have him stop Glipizide   - metFORMIN  (GLUCOPHAGE ) 500 MG tablet; Take 1 tablet (500 mg total) by mouth 2 (two) times daily with a meal.  Dispense: 180 tablet; Refill: 0  2. Long-term current use of injectable noninsulin antidiabetic medication - Increase Mounjaro to 7.5 mg  - Follow up in one month  - tirzepatide (MOUNJARO) 7.5 MG/0.5ML Pen; Inject 7.5 mg into the skin once a week.  Dispense: 2 mL; Refill: 0  3. Essential hypertension - Controlled.  - carvedilol  (COREG ) 25 MG tablet; Take 1 tablet (25 mg total) by mouth 2 (two) times daily with a meal.  Dispense: 180 tablet; Refill: 3  4. Chronic systolic heart failure  (HCC)  - carvedilol  (COREG ) 25 MG tablet; Take 1 tablet (25 mg total) by mouth 2 (two) times daily with a meal.  Dispense: 180 tablet; Refill: 3  5. Class 1 obesity - He has lost about 6 pounds since starting Mounjaro  - Continue to eat healthy and exercise  6. BMI 35.0-35.9,adult  Alto Atta, NP

## 2023-09-06 NOTE — Telephone Encounter (Signed)
 Pharmacy Patient Advocate Encounter  Received notification from [employer] that Prior Authorization for Entresto  has been DENIED.  Full denial letter will be uploaded to the media tab. See denial reason below.   PA #/Case ID/Reference #: [id number]

## 2023-09-06 NOTE — Patient Instructions (Signed)
 Health Maintenance Due  Topic Date Due   Colonoscopy  Never done   Zoster Vaccines- Shingrix (1 of 2) Never done   Pneumococcal Vaccine 56-58 Years old (2 of 2 - PCV) 07/07/2018   COVID-19 Vaccine (2 - 2024-25 season) 12/05/2022       07/15/2023   11:29 AM 04/16/2022    1:56 PM 03/01/2022    3:46 PM  Depression screen PHQ 2/9  Decreased Interest 0 0 0  Down, Depressed, Hopeless 0 0 0  PHQ - 2 Score 0 0 0  Altered sleeping  0   Tired, decreased energy  0   Change in appetite  0   Feeling bad or failure about yourself   0   Trouble concentrating  0   Moving slowly or fidgety/restless  0   Suicidal thoughts  0   PHQ-9 Score  0   Difficult doing work/chores  Not difficult at all

## 2023-09-07 ENCOUNTER — Telehealth (HOSPITAL_COMMUNITY): Payer: Self-pay | Admitting: Pharmacy Technician

## 2023-09-07 NOTE — Telephone Encounter (Signed)
 Advanced Heart Failure Patient Advocate Encounter  Attempted to resubmit request with updated information. Insurance has denied request.  "We received your request for prior authorization for ENTRESTO , for the above member; however, [employer] has a denied request on file for ENTRESTO  for this member. Please follow the appeals process outlined in the original denial or contact Prior Authorization Department at [phone] for further questions."  Routing to [employer] (CPhT) and [facility]) for appeal.  Correne Dillon, CPhT

## 2023-09-07 NOTE — Telephone Encounter (Signed)
 Advanced Heart Failure Patient Advocate Encounter  PA denied. Routed to [employer] (CPhT) and [facility]) for appeal.   "We received your request for prior authorization for ENTRESTO , for the above member; however, [employer] has a denied request on file for ENTRESTO  for this member. Please follow the appeals process outlined in the original denial or contact Prior Authorization Department at [phone] for further questions."  Correne Dillon, CPhT

## 2023-09-07 NOTE — Telephone Encounter (Signed)
 Patient Advocate Encounter   Received notification from [employer] that prior authorization for Entresto  is required.   PA submitted on CoverMyMeds Key [id number] Status is pending   Will continue to follow.

## 2023-09-09 ENCOUNTER — Other Ambulatory Visit (HOSPITAL_COMMUNITY): Payer: Self-pay | Admitting: Pharmacist

## 2023-09-09 ENCOUNTER — Telehealth (HOSPITAL_COMMUNITY): Payer: Self-pay

## 2023-09-09 ENCOUNTER — Other Ambulatory Visit (HOSPITAL_COMMUNITY): Payer: Self-pay

## 2023-09-09 MED ORDER — LISINOPRIL 40 MG PO TABS: 40.0000 mg | ORAL_TABLET | Freq: Every day | ORAL | 3 refills | Status: AC

## 2023-09-09 NOTE — Telephone Encounter (Signed)
 Contacted on patient today, 09/09/23, regarding status of new Entresto  prescription. Explained to patient that the medication is not approved by his insurance company at this time. Instructed patient to keep taking lisinopril  40 mg daily and could reassess appropriateness of Entresto  at a future appointment.   Patient expressed understanding of the medication changes listed above.   Volney Grumbles, PharmD PGY-1 Acute Care Pharmacy Resident 09/09/2023 4:19 PM

## 2023-09-09 NOTE — Telephone Encounter (Signed)
 Advanced Heart Failure Patient Advocate Encounter  Patient does not meet criteria for appeal. He was NYHA I-II in 2011. Since Entresto  was not added on until 2025, cannot argue NYHA status has improved due to Entresto . [employer], since there is a documented denial on file, they will not allow us  to reopen the original PA to update information. Was informed that another PA can be submitted 180 days from 08/23/23.  [facility]) is aware. Loris Ros will call the patient to talk about switching back to Lisinopril  for the time being.   Correne Dillon, CPhT

## 2023-09-09 NOTE — Telephone Encounter (Signed)
 Insurance denied PA since patient is NYHA class 1 as documented in the last [facility] note. Their criteria for approval is NYHA II-IV. Unable to appeal. Patient will need to continue lisinopril  40 mg daily. Will remove Entresto  from medication list and resend lisinopril  prescription. Patient aware and expressed understanding.

## 2023-09-12 ENCOUNTER — Other Ambulatory Visit (HOSPITAL_COMMUNITY): Payer: Self-pay | Admitting: Internal Medicine

## 2023-10-13 ENCOUNTER — Other Ambulatory Visit: Payer: Self-pay | Admitting: Adult Health

## 2023-10-13 DIAGNOSIS — Z7985 Long-term (current) use of injectable non-insulin antidiabetic drugs: Secondary | ICD-10-CM

## 2023-10-18 ENCOUNTER — Other Ambulatory Visit: Payer: Self-pay

## 2023-10-18 ENCOUNTER — Other Ambulatory Visit: Payer: Self-pay | Admitting: Adult Health

## 2023-10-18 DIAGNOSIS — Z7985 Long-term (current) use of injectable non-insulin antidiabetic drugs: Secondary | ICD-10-CM

## 2023-10-18 DIAGNOSIS — E782 Mixed hyperlipidemia: Secondary | ICD-10-CM

## 2023-10-18 DIAGNOSIS — I10 Essential (primary) hypertension: Secondary | ICD-10-CM

## 2023-10-18 DIAGNOSIS — I5022 Chronic systolic (congestive) heart failure: Secondary | ICD-10-CM

## 2023-10-18 NOTE — Telephone Encounter (Signed)
 Pt will be here in 2 days. Will refill then if appropriate.

## 2023-10-20 ENCOUNTER — Ambulatory Visit: Admitting: Adult Health

## 2023-10-20 NOTE — Progress Notes (Deleted)
 Subjective:    Patient ID: Caleb Garcia, male    DOB: Dec 16, 1965, 58 y.o.   MRN: [id number]  HPI  58 year old male who  has a past medical history of Arthritis, Cardiomyopathy, CHF (congestive heart failure) (HCC), Diabetes mellitus without complication (HCC), HTN (hypertension), Hyperlipidemia, Hypokalemia, Obesity, OSA (obstructive sleep apnea), Sleep apnea, Stroke (HCC) (10/2008), and Systolic CHF (HCC).  He presents to the office today for follow up regarding DM/Hypertension/Obesity  Diabetes mellitus type 2-managed with metformin  500 mg twice daily, glipizide  10 mg XR ,Jardiance  10 mg daily ( prescribed by Cardiology for CHF) and we have been titrating him up on Mounjaro , he is currently at 7.5 mg dose. He has been tolerating this medication well and has not had any side effects such as nausea, vomiting or constipation. He has greatly reduced his portion size. He has started to notice his clothes fitting looser.  Lab Results  Component Value Date   HGBA1C 6.1 (A) 09/06/2023   HGBA1C 6.6 (A) 05/17/2023   HGBA1C 7.2 (A) 07/28/2022   Wt Readings from Last 3 Encounters:  09/06/23 244 lb (110.7 kg)  08/09/23 247 lb 12.8 oz (112.4 kg)  07/15/23 250 lb (113.4 kg)   HTN -managed with lisinopril  40 mg daily, hydralazine  25 mg 3 times daily, Plendil  2.5 mg daily, Spirolactone 25 mg daily, and Coreg  25 mg daily.  He denies dizziness, lightheadedness, chest pain, or shortness of breath. Cardiology is trying to get Entresto  approved for him.  BP Readings from Last 3 Encounters:  09/06/23 110/70  08/09/23 120/80  07/15/23 (!) 140/94    Review of Systems See HPI   Past Medical History:  Diagnosis Date   Arthritis    Cardiomyopathy    Non-ischemic   CHF (congestive heart failure) (HCC)    Diabetes mellitus without complication (HCC)    HTN (hypertension)    Hyperlipidemia    Hypokalemia    Obesity    OSA (obstructive sleep apnea)    Sleep apnea    had surgery- no cpap    Stroke (HCC) 10/2008   Systolic CHF (HCC)     Social History   Socioeconomic History   Marital status: Married    Spouse name: Not on file   Number of children: Not on file   Years of education: Not on file   Highest education level: Not on file  Occupational History    Employer: UNEMPLOYED  Tobacco Use   Smoking status: Some Days    Types: Cigars   Smokeless tobacco: Never  Vaping Use   Vaping status: Never Used  Substance and Sexual Activity   Alcohol use: No   Drug use: No   Sexual activity: Not on file  Other Topics Concern   Not on file  Social History Narrative   Married 6 years   2 biologic daughters   2 stepsons, one Programme researcher, broadcasting/film/video for [employer]    Social Drivers of Health   Financial Resource Strain: Not on file  Food Insecurity: No Food Insecurity (03/01/2022)   Hunger Vital Sign    Worried About Running Out of Food in the Last Year: Never true    Ran Out of Food in the Last Year: Never true  Transportation Needs: No Transportation Needs (03/08/2022)   PRAPARE - Administrator, Civil Service (Medical): No    Lack of Transportation (Non-Medical): No  Physical Activity: Not on file  Stress: Not on file  Social Connections:  Not on file  Intimate Partner Violence: Not on file    Past Surgical History:  Procedure Laterality Date   avulsion fracture of left hip     CARDIAC CATHETERIZATION  10/08/08   LEFT HEART CATH AND CORONARY ANGIOGRAPHY N/A 08/03/2016   Procedure: Left Heart Cath and Coronary Angiography;  Surgeon: Toribio JONELLE Fuel, MD;  Location: [facility] INVASIVE CV LAB;  Service: Cardiovascular;  Laterality: N/A;   TONSILLECTOMY     TOTAL KNEE ARTHROPLASTY Right 04/21/2021   Procedure: RIGHT TOTAL KNEE ARTHROPLASTY;  Surgeon: Addie Cordella Hamilton, MD;  Location: [facility] OR;  Service: Orthopedics;  Laterality: Right;   UVULOPALATOPHARYNGOPLASTY      Family History  Problem Relation Age of Onset   Diabetes Mother    Hypertension Mother     Heart disease Mother    Cancer Father        Throat cancer   Stroke Father    Stroke Brother 109   Prostate cancer Neg Hx    Colon cancer Neg Hx    Colon polyps Neg Hx    Esophageal cancer Neg Hx    Rectal cancer Neg Hx    Stomach cancer Neg Hx     Allergies  Allergen Reactions   Other     Almonds-caused swelling in hands   Shrimp [Shellfish Allergy] Swelling    Current Outpatient Medications on File Prior to Visit  Medication Sig Dispense Refill   acetaminophen -codeine  (TYLENOL  #3) 300-30 MG tablet Take 1 tablet by mouth every 6 (six) hours as needed for moderate pain. 30 tablet 0   aspirin  81 MG tablet Take 81 mg by mouth daily.       atorvastatin  (LIPITOR) 20 MG tablet Take 1 tablet (20 mg total) by mouth daily. 90 tablet 3   carvedilol  (COREG ) 25 MG tablet Take 1 tablet (25 mg total) by mouth 2 (two) times daily with a meal. 180 tablet 3   Chlorpheniramine-DM (CORICIDIN HBP COUGH/COLD PO) Take 1 tablet by mouth every 12 (twelve) hours as needed (cold symptoms).     felodipine  (PLENDIL ) 2.5 MG 24 hr tablet Take 1 tablet (2.5 mg total) by mouth daily. 90 tablet 3   hydrALAZINE  (APRESOLINE ) 50 MG tablet TAKE 1 TABLET BY MOUTH THREE TIMES DAILY 90 tablet 0   JARDIANCE  10 MG TABS tablet TAKE 1 TABLET BY MOUTH ONCE DAILY BEFORE BREAKFAST 30 tablet 0   lisinopril  (ZESTRIL ) 40 MG tablet Take 1 tablet (40 mg total) by mouth daily. 90 tablet 3   metFORMIN  (GLUCOPHAGE ) 500 MG tablet Take 1 tablet (500 mg total) by mouth 2 (two) times daily with a meal. 180 tablet 0   pantoprazole  (PROTONIX ) 40 MG tablet Take 1 tablet (40 mg total) by mouth daily. 14 tablet 0   potassium chloride  SA (KLOR-CON  M) 20 MEQ tablet Take 1 tablet (20 mEq total) by mouth as needed. 30 tablet 6   spironolactone  (ALDACTONE ) 25 MG tablet Take 2 tablets (50 mg total) by mouth daily. 180 tablet 3   tadalafil  (CIALIS ) 20 MG tablet TAKE 1 TABLET BY MOUTH ONCE DAILY AS NEEDED FOR ERECTILE DYSFUNCTION 10 tablet 0    tirzepatide  (MOUNJARO ) 7.5 MG/0.5ML Pen Inject 7.5 mg into the skin once a week. 2 mL 0   No current facility-administered medications on file prior to visit.    There were no vitals taken for this visit.      Objective:   Physical Exam Vitals and nursing note reviewed.  Constitutional:  Appearance: Normal appearance. He is obese.  Cardiovascular:     Rate and Rhythm: Normal rate and regular rhythm.     Pulses: Normal pulses.     Heart sounds: Normal heart sounds.  Pulmonary:     Effort: Pulmonary effort is normal.     Breath sounds: Normal breath sounds.  Skin:    General: Skin is warm and dry.  Neurological:     General: No focal deficit present.     Mental Status: He is alert and oriented to person, place, and time.  Psychiatric:        Mood and Affect: Mood normal.        Behavior: Behavior normal.        Thought Content: Thought content normal.        Judgment: Judgment normal.           Assessment & Plan:

## 2023-10-25 ENCOUNTER — Ambulatory Visit: Admitting: Adult Health

## 2023-10-25 NOTE — Progress Notes (Deleted)
   Subjective:    Patient ID: Caleb Garcia, male    DOB: April 11, 1965, 58 y.o.   MRN: [id number]  HPI    Review of Systems     Objective:   Physical Exam        Assessment & Plan:

## 2023-10-27 ENCOUNTER — Ambulatory Visit: Admitting: Adult Health

## 2023-10-27 ENCOUNTER — Encounter: Payer: Self-pay | Admitting: Adult Health

## 2023-10-27 VITALS — BP 136/90 | HR 90 | Temp 98.4°F | Wt 241.2 lb

## 2023-10-27 DIAGNOSIS — Z7985 Long-term (current) use of injectable non-insulin antidiabetic drugs: Secondary | ICD-10-CM | POA: Diagnosis not present

## 2023-10-27 DIAGNOSIS — E119 Type 2 diabetes mellitus without complications: Secondary | ICD-10-CM | POA: Diagnosis not present

## 2023-10-27 DIAGNOSIS — E66811 Obesity, class 1: Secondary | ICD-10-CM | POA: Diagnosis not present

## 2023-10-27 DIAGNOSIS — Z6834 Body mass index (BMI) 34.0-34.9, adult: Secondary | ICD-10-CM

## 2023-10-27 DIAGNOSIS — N521 Erectile dysfunction due to diseases classified elsewhere: Secondary | ICD-10-CM

## 2023-10-27 DIAGNOSIS — Z7984 Long term (current) use of oral hypoglycemic drugs: Secondary | ICD-10-CM

## 2023-10-27 DIAGNOSIS — I10 Essential (primary) hypertension: Secondary | ICD-10-CM

## 2023-10-27 MED ORDER — TIRZEPATIDE 10 MG/0.5ML ~~LOC~~ SOAJ: 10.0000 mg | SUBCUTANEOUS | 0 refills | Status: DC

## 2023-10-27 MED ORDER — SILDENAFIL CITRATE 100 MG PO TABS: 50.0000 mg | ORAL_TABLET | Freq: Every day | ORAL | 11 refills | Status: AC | PRN

## 2023-10-27 NOTE — Progress Notes (Signed)
 Subjective:    Patient ID: Caleb Garcia, male    DOB: 1965-05-03, 58 y.o.   MRN: [id number]  HPI  He presents to the office today for follow up regarding DM/Hypertension/Obesity  Diabetes mellitus type 2-managed with metformin  500 mg twice daily, Jardiance  10 mg daily ( prescribed by Cardiology for CHF) and we have been titrating him up on Mounjaro , he is currently at 7.5 mg dose. He has been tolerating this medication well and has not had any side effects such as nausea, vomiting or constipation. He has greatly reduced his portion size. He has started to notice his clothes fitting looser. He is eating healthier and has quit smoking cigars. He has not started exercise. Denies hypoglycemic events.  Lab Results  Component Value Date   HGBA1C 6.1 (A) 09/06/2023   HGBA1C 6.6 (A) 05/17/2023   HGBA1C 7.2 (A) 07/28/2022   Wt Readings from Last 10 Encounters:  10/27/23 241 lb 3.2 oz (109.4 kg)  09/06/23 244 lb (110.7 kg)  08/09/23 247 lb 12.8 oz (112.4 kg)  07/15/23 250 lb (113.4 kg)  07/02/23 235 lb 0.2 oz (106.6 kg)  06/29/23 235 lb (106.6 kg)  05/17/23 256 lb (116.1 kg)  01/10/23 251 lb 9.6 oz (114.1 kg)  08/24/22 239 lb (108.4 kg)  08/12/22 245 lb (111.1 kg)   HTN -managed with lisinopril  40 mg daily, hydralazine  25 mg 3 times daily, Plendil  2.5 mg daily, Spirolactone 25 mg daily, and Coreg  25 mg daily.  He denies dizziness, lightheadedness, chest pain, or shortness of breath. Cardiology is trying to get Entresto  approved for him.  BP Readings from Last 3 Encounters:  10/27/23 (!) 136/90  09/06/23 110/70  08/09/23 120/80   Erectile Dysfunction -currently managed with Cialis  20 mg and reports that this has not been working for him, he dates it is hit or miss, sometimes it works and sometimes it does not.  He has also been on Viagra  in the past but it looks like he developed headaches and it was ineffective.  He does not want to go back and see urology as he thought this was the  money grabbed.  He is interested in trying Viagra  again  Review of Systems See HPI   Past Medical History:  Diagnosis Date   Arthritis    Cardiomyopathy    Non-ischemic   CHF (congestive heart failure) (HCC)    Diabetes mellitus without complication (HCC)    HTN (hypertension)    Hyperlipidemia    Hypokalemia    Obesity    OSA (obstructive sleep apnea)    Sleep apnea    had surgery- no cpap   Stroke (HCC) 10/2008   Systolic CHF (HCC)     Social History   Socioeconomic History   Marital status: Married    Spouse name: Not on file   Number of children: Not on file   Years of education: Not on file   Highest education level: Not on file  Occupational History    Employer: UNEMPLOYED  Tobacco Use   Smoking status: Some Days    Types: Cigars   Smokeless tobacco: Never  Vaping Use   Vaping status: Never Used  Substance and Sexual Activity   Alcohol use: No   Drug use: No   Sexual activity: Not on file  Other Topics Concern   Not on file  Social History Narrative   Married 6 years   2 biologic daughters   2 stepsons, one Programme researcher, broadcasting/film/video for  Primary school teacher    Social Drivers of Corporate investment banker Strain: Not on file  Food Insecurity: No Food Insecurity (03/01/2022)   Hunger Vital Sign    Worried About Running Out of Food in the Last Year: Never true    Ran Out of Food in the Last Year: Never true  Transportation Needs: No Transportation Needs (03/08/2022)   PRAPARE - Administrator, Civil Service (Medical): No    Lack of Transportation (Non-Medical): No  Physical Activity: Not on file  Stress: Not on file  Social Connections: Not on file  Intimate Partner Violence: Not on file    Past Surgical History:  Procedure Laterality Date   avulsion fracture of left hip     CARDIAC CATHETERIZATION  10/08/08   LEFT HEART CATH AND CORONARY ANGIOGRAPHY N/A 08/03/2016   Procedure: Left Heart Cath and Coronary Angiography;  Surgeon: Toribio JONELLE Fuel,  MD;  Location: [facility] INVASIVE CV LAB;  Service: Cardiovascular;  Laterality: N/A;   TONSILLECTOMY     TOTAL KNEE ARTHROPLASTY Right 04/21/2021   Procedure: RIGHT TOTAL KNEE ARTHROPLASTY;  Surgeon: Addie Cordella Hamilton, MD;  Location: [facility] OR;  Service: Orthopedics;  Laterality: Right;   UVULOPALATOPHARYNGOPLASTY      Family History  Problem Relation Age of Onset   Diabetes Mother    Hypertension Mother    Heart disease Mother    Cancer Father        Throat cancer   Stroke Father    Stroke Brother 106   Prostate cancer Neg Hx    Colon cancer Neg Hx    Colon polyps Neg Hx    Esophageal cancer Neg Hx    Rectal cancer Neg Hx    Stomach cancer Neg Hx     Allergies  Allergen Reactions   Other     Almonds-caused swelling in hands   Shrimp [Shellfish Allergy] Swelling    Current Outpatient Medications on File Prior to Visit  Medication Sig Dispense Refill   acetaminophen -codeine  (TYLENOL  #3) 300-30 MG tablet Take 1 tablet by mouth every 6 (six) hours as needed for moderate pain. 30 tablet 0   aspirin  81 MG tablet Take 81 mg by mouth daily.       atorvastatin  (LIPITOR) 20 MG tablet Take 1 tablet (20 mg total) by mouth daily. 90 tablet 3   carvedilol  (COREG ) 25 MG tablet Take 1 tablet (25 mg total) by mouth 2 (two) times daily with a meal. 180 tablet 3   Chlorpheniramine-DM (CORICIDIN HBP COUGH/COLD PO) Take 1 tablet by mouth every 12 (twelve) hours as needed (cold symptoms).     felodipine  (PLENDIL ) 2.5 MG 24 hr tablet Take 1 tablet (2.5 mg total) by mouth daily. 90 tablet 3   hydrALAZINE  (APRESOLINE ) 50 MG tablet TAKE 1 TABLET BY MOUTH THREE TIMES DAILY 90 tablet 0   JARDIANCE  10 MG TABS tablet TAKE 1 TABLET BY MOUTH ONCE DAILY BEFORE BREAKFAST 30 tablet 0   lisinopril  (ZESTRIL ) 40 MG tablet Take 1 tablet (40 mg total) by mouth daily. 90 tablet 3   metFORMIN  (GLUCOPHAGE ) 500 MG tablet Take 1 tablet (500 mg total) by mouth 2 (two) times daily with a meal. 180 tablet 0   spironolactone   (ALDACTONE ) 25 MG tablet Take 2 tablets (50 mg total) by mouth daily. 180 tablet 3   potassium chloride  SA (KLOR-CON  M) 20 MEQ tablet Take 1 tablet (20 mEq total) by mouth as needed. (Patient not taking: Reported on 10/27/2023) 30  tablet 6   No current facility-administered medications on file prior to visit.    BP (!) 136/90 (BP Location: Left Arm, Patient Position: Sitting, Cuff Size: Large)   Pulse 90   Temp 98.4 F (36.9 C) (Oral)   Wt 241 lb 3.2 oz (109.4 kg)   SpO2 98%   BMI 34.61 kg/m       Objective:   Physical Exam Vitals and nursing note reviewed.  Constitutional:      Appearance: Normal appearance.  Cardiovascular:     Rate and Rhythm: Normal rate and regular rhythm.     Pulses: Normal pulses.     Heart sounds: Normal heart sounds.  Pulmonary:     Effort: Pulmonary effort is normal.     Breath sounds: Normal breath sounds.  Musculoskeletal:        General: Normal range of motion.  Skin:    General: Skin is warm and dry.  Neurological:     General: No focal deficit present.     Mental Status: He is alert and oriented to person, place, and time.  Psychiatric:        Mood and Affect: Mood normal.        Thought Content: Thought content normal.        Judgment: Judgment normal.       Assessment & Plan:  1. Long-term current use of injectable noninsulin antidiabetic medication (Primary) - Will increase Mounjaro  to 10 mg x 3 months and have him follow up as this time for A1c check  - tirzepatide  (MOUNJARO ) 10 MG/0.5ML Pen; Inject 10 mg into the skin once a week.  Dispense: 6 mL; Refill: 0  2. Diabetes mellitus treated with oral medication (HCC) - Will have him cut back on Metformin  to daily dosing   3. Class 1 obesity  - tirzepatide  (MOUNJARO ) 10 MG/0.5ML Pen; Inject 10 mg into the skin once a week.  Dispense: 6 mL; Refill: 0  4. BMI 35.0-35.9,adult - Encouraged exercise. Continue to make dietary modifications  - tirzepatide  (MOUNJARO ) 10 MG/0.5ML Pen;  Inject 10 mg into the skin once a week.  Dispense: 6 mL; Refill: 0  5. Essential hypertension - Slightly elevated today. We will keep an eye on this   6. Erectile dysfunction due to diseases classified elsewhere  - sildenafil  (VIAGRA ) 100 MG tablet; Take 0.5-1 tablets (50-100 mg total) by mouth daily as needed for erectile dysfunction.  Dispense: 5 tablet; Refill: 11  Darleene Shape, NP

## 2023-11-10 ENCOUNTER — Encounter (HOSPITAL_COMMUNITY): Payer: Self-pay

## 2023-11-10 ENCOUNTER — Emergency Department (HOSPITAL_COMMUNITY)

## 2023-11-10 ENCOUNTER — Emergency Department (HOSPITAL_COMMUNITY)
Admission: EM | Admit: 2023-11-10 | Discharge: 2023-11-11 | Disposition: A | Discharge status: Home / Self Care | Attending: Emergency Medicine | Admitting: Emergency Medicine

## 2023-11-10 ENCOUNTER — Other Ambulatory Visit: Payer: Self-pay

## 2023-11-10 DIAGNOSIS — Z794 Long term (current) use of insulin: Secondary | ICD-10-CM | POA: Insufficient documentation

## 2023-11-10 DIAGNOSIS — I509 Heart failure, unspecified: Secondary | ICD-10-CM | POA: Diagnosis not present

## 2023-11-10 DIAGNOSIS — E119 Type 2 diabetes mellitus without complications: Secondary | ICD-10-CM | POA: Insufficient documentation

## 2023-11-10 DIAGNOSIS — R079 Chest pain, unspecified: Secondary | ICD-10-CM | POA: Diagnosis present

## 2023-11-10 DIAGNOSIS — I110 Hypertensive heart disease with heart failure: Secondary | ICD-10-CM | POA: Insufficient documentation

## 2023-11-10 DIAGNOSIS — Z7982 Long term (current) use of aspirin: Secondary | ICD-10-CM | POA: Insufficient documentation

## 2023-11-10 DIAGNOSIS — Z7984 Long term (current) use of oral hypoglycemic drugs: Secondary | ICD-10-CM | POA: Insufficient documentation

## 2023-11-10 DIAGNOSIS — Z79899 Other long term (current) drug therapy: Secondary | ICD-10-CM | POA: Insufficient documentation

## 2023-11-10 LAB — BASIC METABOLIC PANEL WITH GFR
Anion gap: 10 (ref 5–15)
BUN: 10 mg/dL (ref 6–20)
CO2: 22 mmol/L (ref 22–32)
Calcium: 8.9 mg/dL (ref 8.9–10.3)
Chloride: 105 mmol/L (ref 98–111)
Creatinine, Ser: 0.92 mg/dL (ref 0.61–1.24)
GFR, Estimated: >60 mL/min (ref >60)
Glucose, Bld: 131 mg/dL — ABNORMAL HIGH (ref 70–99)
Potassium: 3.8 mmol/L (ref 3.5–5.1)
Sodium: 137 mmol/L (ref 135–145)

## 2023-11-10 LAB — CBC
HCT: 39.2 % (ref 39.0–52.0)
Hemoglobin: 13.4 g/dL (ref 13.0–17.0)
MCH: 31.5 pg (ref 26.0–34.0)
MCHC: 34.2 g/dL (ref 30.0–36.0)
MCV: 92 fL (ref 80.0–100.0)
Platelets: 233 K/uL (ref 150–400)
RBC: 4.26 MIL/uL (ref 4.22–5.81)
RDW: 13.2 % (ref 11.5–15.5)
WBC: 5.9 K/uL (ref 4.0–10.5)
nRBC: 0 % (ref 0.0–0.2)

## 2023-11-10 LAB — TROPONIN I (HIGH SENSITIVITY): Troponin I (High Sensitivity): 9 ng/L (ref <18)

## 2023-11-10 MED ORDER — ALUM & MAG HYDROXIDE-SIMETH 200-200-20 MG/5ML PO SUSP
30.0000 mL | Freq: Once | ORAL | Status: AC
  Administered 2023-11-11: 30 mL via ORAL
  Filled 2023-11-10: qty 30

## 2023-11-10 MED ORDER — FAMOTIDINE 20 MG PO TABS
20.0000 mg | ORAL_TABLET | Freq: Once | ORAL | Status: AC
  Administered 2023-11-11: 20 mg via ORAL
  Filled 2023-11-10: qty 1

## 2023-11-10 NOTE — ED Triage Notes (Signed)
 PT BEGAN HAVING LEFT SIDED CHEST PAIN 3 DAYS AGO. DOES NOT RADIATE. NOTHING MAKES IT BETTER OR WORSE. Denies sob.

## 2023-11-10 NOTE — ED Provider Notes (Incomplete)
 [place] EMERGENCY DEPARTMENT AT [facility] Provider Note   CSN: [id number] Arrival date & time: 11/10/23  2208     Patient presents with: Chest Pain  HPI Caleb Garcia is a 58 y.o. male with hypertension, CHF, type 2 diabetes presenting for chest pain.  Started 3 days ago it is intermittent.  It is central at times radiates rightward.  He states it is worse at night while lying flat in bed and usually better in the morning when he gets moving around.  Denies shortness of breath.  Denies calf tenderness or swelling.  Denies any recent long trips or immobilization, known history of blood clots.  Denies fever and cough.  He does endorse some issues with acid reflux and states that his PCP sent a prescription of pantoprazole  to his pharmacy today.  He has not tried this yet.  {Add pertinent medical, surgical, social history, OB history to HPI:32947}  Chest Pain      Prior to Admission medications   Medication Sig Start Date End Date Taking? Authorizing Provider  acetaminophen -codeine  (TYLENOL  #3) 300-30 MG tablet Take 1 tablet by mouth every 6 (six) hours as needed for moderate pain. 06/30/22   Addie Cordella Hamilton, MD  aspirin  81 MG tablet Take 81 mg by mouth daily.      [provider]  atorvastatin  (LIPITOR) 20 MG tablet Take 1 tablet (20 mg total) by mouth daily. 05/17/23   Nafziger, Darleene, NP  carvedilol  (COREG ) 25 MG tablet Take 1 tablet (25 mg total) by mouth 2 (two) times daily with a meal. 09/06/23 12/05/23  Nafziger, Darleene, NP  Chlorpheniramine-DM (CORICIDIN HBP COUGH/COLD PO) Take 1 tablet by mouth every 12 (twelve) hours as needed (cold symptoms).    [provider]  felodipine  (PLENDIL ) 2.5 MG 24 hr tablet Take 1 tablet (2.5 mg total) by mouth daily. 05/17/23   Nafziger, Darleene, NP  hydrALAZINE  (APRESOLINE ) 50 MG tablet TAKE 1 TABLET BY MOUTH THREE TIMES DAILY 08/05/23   Nafziger, Darleene, NP  JARDIANCE  10 MG TABS tablet TAKE 1 TABLET BY MOUTH ONCE DAILY  BEFORE BREAKFAST 09/15/23   Bensimhon, Toribio SAUNDERS, MD  lisinopril  (ZESTRIL ) 40 MG tablet Take 1 tablet (40 mg total) by mouth daily. 09/09/23 12/08/23  Bensimhon, Toribio SAUNDERS, MD  metFORMIN  (GLUCOPHAGE ) 500 MG tablet Take 1 tablet (500 mg total) by mouth 2 (two) times daily with a meal. 09/06/23   Nafziger, Darleene, NP  potassium chloride  SA (KLOR-CON  M) 20 MEQ tablet Take 1 tablet (20 mEq total) by mouth as needed. Patient not taking: Reported on 10/27/2023 01/10/23   Glena Harlene HERO, FNP  sildenafil  (VIAGRA ) 100 MG tablet Take 0.5-1 tablets (50-100 mg total) by mouth daily as needed for erectile dysfunction. 10/27/23   Nafziger, Darleene, NP  spironolactone  (ALDACTONE ) 25 MG tablet Take 2 tablets (50 mg total) by mouth daily. 05/17/23   Nafziger, Darleene, NP  tirzepatide  (MOUNJARO ) 10 MG/0.5ML Pen Inject 10 mg into the skin once a week. 10/27/23 01/25/24  Merna Darleene, NP    Allergies: Other and Shrimp [shellfish allergy]    Review of Systems  Cardiovascular:  Positive for chest pain.    Updated Vital Signs BP (!) 144/91 (BP Location: Right Arm)   Pulse 100   Temp 98.6 F (37 C)   Resp 20   Ht 5' 10 (1.778 m)   Wt 109.4 kg   SpO2 97%   BMI 34.61 kg/m   Physical Exam  (all labs ordered are listed, but  only abnormal results are displayed) Labs Reviewed  BASIC METABOLIC PANEL WITH GFR - Abnormal; Notable for the following components:      Result Value   Glucose, Bld 131 (*)    All other components within normal limits  CBC  TROPONIN I (HIGH SENSITIVITY)    EKG: None  Radiology: DG Chest 2 View Result Date: 11/10/2023 CLINICAL DATA:  cp EXAM: CHEST - 2 VIEW COMPARISON:  Chest x-ray 07/03/2023 FINDINGS: The heart and mediastinal contours are within normal limits. No focal consolidation. No pulmonary edema. No pleural effusion. No pneumothorax. No acute osseous abnormality. IMPRESSION: No active cardiopulmonary disease. Electronically Signed   By: Morgane  Naveau M.D.   On: 11/10/2023 22:37     {Document cardiac monitor, telemetry assessment procedure when appropriate:32947} Procedures   Medications Ordered in the ED - No data to display    {Click here for ABCD2, HEART and other calculators REFRESH Note before signing:1}                              Medical Decision Making Amount and/or Complexity of Data Reviewed Labs: ordered. Radiology: ordered.   ***  {Document critical care time when appropriate  Document review of labs and clinical decision tools ie CHADS2VASC2, etc  Document your independent review of radiology images and any outside records  Document your discussion with family members, caretakers and with consultants  Document social determinants of health affecting pt's care  Document your decision making why or why not admission, treatments were needed:32947:::1}   Final diagnoses:  None    ED Discharge Orders     None

## 2023-11-10 NOTE — ED Provider Notes (Cosign Needed)
 [place] EMERGENCY DEPARTMENT AT [facility] Provider Note   CSN: [id number] Arrival date & time: 11/10/23  2208     Patient presents with: Chest Pain  HPI JCION BUDDENHAGEN is a 58 y.o. male with hypertension, CHF, type 2 diabetes presenting for chest pain.  Started 3 days ago it is intermittent.  It is central at times radiates rightward.  He states it is worse at night while lying flat in bed and usually better in the morning when he gets moving around.  Denies shortness of breath.  Denies calf tenderness or swelling.  Denies any recent long trips or immobilization, known history of blood clots.  Denies fever and cough.  He does endorse some issues with acid reflux and states that his PCP sent a prescription of pantoprazole  to his pharmacy today.  He has not tried this yet.    Chest Pain      Prior to Admission medications   Medication Sig Start Date End Date Taking? Authorizing Provider  acetaminophen -codeine  (TYLENOL  #3) 300-30 MG tablet Take 1 tablet by mouth every 6 (six) hours as needed for moderate pain. 06/30/22   Addie Cordella Hamilton, MD  aspirin  81 MG tablet Take 81 mg by mouth daily.      [provider]  atorvastatin  (LIPITOR) 20 MG tablet Take 1 tablet (20 mg total) by mouth daily. 05/17/23   Nafziger, Darleene, NP  carvedilol  (COREG ) 25 MG tablet Take 1 tablet (25 mg total) by mouth 2 (two) times daily with a meal. 09/06/23 12/05/23  Nafziger, Darleene, NP  Chlorpheniramine-DM (CORICIDIN HBP COUGH/COLD PO) Take 1 tablet by mouth every 12 (twelve) hours as needed (cold symptoms).    [provider]  felodipine  (PLENDIL ) 2.5 MG 24 hr tablet Take 1 tablet (2.5 mg total) by mouth daily. 05/17/23   Nafziger, Darleene, NP  hydrALAZINE  (APRESOLINE ) 50 MG tablet TAKE 1 TABLET BY MOUTH THREE TIMES DAILY 08/05/23   Nafziger, Darleene, NP  JARDIANCE  10 MG TABS tablet TAKE 1 TABLET BY MOUTH ONCE DAILY BEFORE BREAKFAST 09/15/23   Bensimhon, Toribio SAUNDERS, MD  lisinopril  (ZESTRIL ) 40 MG  tablet Take 1 tablet (40 mg total) by mouth daily. 09/09/23 12/08/23  Bensimhon, Toribio SAUNDERS, MD  metFORMIN  (GLUCOPHAGE ) 500 MG tablet Take 1 tablet (500 mg total) by mouth 2 (two) times daily with a meal. 09/06/23   Nafziger, Darleene, NP  potassium chloride  SA (KLOR-CON  M) 20 MEQ tablet Take 1 tablet (20 mEq total) by mouth as needed. Patient not taking: Reported on 10/27/2023 01/10/23   Glena Harlene HERO, FNP  sildenafil  (VIAGRA ) 100 MG tablet Take 0.5-1 tablets (50-100 mg total) by mouth daily as needed for erectile dysfunction. 10/27/23   Nafziger, Darleene, NP  spironolactone  (ALDACTONE ) 25 MG tablet Take 2 tablets (50 mg total) by mouth daily. 05/17/23   Nafziger, Darleene, NP  tirzepatide  (MOUNJARO ) 10 MG/0.5ML Pen Inject 10 mg into the skin once a week. 10/27/23 01/25/24  Merna Darleene, NP    Allergies: Other and Shrimp [shellfish allergy]    Review of Systems  Cardiovascular:  Positive for chest pain.    Updated Vital Signs BP (!) 136/92   Pulse 81   Temp 98.6 F (37 C)   Resp 17   Ht 5' 10 (1.778 m)   Wt 109.4 kg   SpO2 99%   BMI 34.61 kg/m   Physical Exam Vitals and nursing note reviewed.  HENT:     Head: Normocephalic and atraumatic.     Mouth/Throat:  Mouth: Mucous membranes are moist.  Eyes:     General:        Right eye: No discharge.        Left eye: No discharge.     Conjunctiva/sclera: Conjunctivae normal.  Cardiovascular:     Rate and Rhythm: Normal rate and regular rhythm.     Pulses: Normal pulses.     Heart sounds: Normal heart sounds.  Pulmonary:     Effort: Pulmonary effort is normal.     Breath sounds: Normal breath sounds.  Abdominal:     General: Abdomen is flat.     Palpations: Abdomen is soft.  Skin:    General: Skin is warm and dry.  Neurological:     General: No focal deficit present.  Psychiatric:        Mood and Affect: Mood normal.     (all labs ordered are listed, but only abnormal results are displayed) Labs Reviewed  BASIC METABOLIC PANEL  WITH GFR - Abnormal; Notable for the following components:      Result Value   Glucose, Bld 131 (*)    All other components within normal limits  CBC  D-DIMER, QUANTITATIVE  TROPONIN I (HIGH SENSITIVITY)  TROPONIN I (HIGH SENSITIVITY)    EKG: None  Radiology: DG Chest 2 View Result Date: 11/10/2023 CLINICAL DATA:  cp EXAM: CHEST - 2 VIEW COMPARISON:  Chest x-ray 07/03/2023 FINDINGS: The heart and mediastinal contours are within normal limits. No focal consolidation. No pulmonary edema. No pleural effusion. No pneumothorax. No acute osseous abnormality. IMPRESSION: No active cardiopulmonary disease. Electronically Signed   By: Morgane  Naveau M.D.   On: 11/10/2023 22:37     Procedures   Medications Ordered in the ED  famotidine  (PEPCID ) tablet 20 mg (20 mg Oral Given 11/11/23 0004)  alum & mag hydroxide-simeth (MAALOX/MYLANTA) 200-200-20 MG/5ML suspension 30 mL (30 mLs Oral Given 11/11/23 0004)    Clinical Course as of 11/11/23 0125  Fri Nov 11, 2023  0122 DG Chest 2 View [JR]    Clinical Course User Index [JR] Lang Norleen POUR, PA-C                                 Medical Decision Making Amount and/or Complexity of Data Reviewed Labs: ordered. Radiology: ordered.  Risk OTC drugs.   Initial Impression and Ddx 58 year old well-appearing male present for chest pain.  Exam was unremarkable.  DDx includes ACS, PE, aortic dissection, pneumothorax, costochondritis, reflux, other. Patient PMH that increases complexity of ED encounter:  hypertension, CHF, type 2 diabetes  Interpretation of Diagnostics - I independent reviewed and interpreted the labs as followed: No acute derangements.  Troponin and D-dimer are negative  - I independently visualized the following imaging with scope of interpretation limited to determining acute life threatening conditions related to emergency care: CXR, which revealed no acute findings  - I personally reviewed and interpreted EKG which revealed  NSR  Patient Reassessment and Ultimate Disposition/Management On reassessment after GI cocktail, he stated that his chest pain had improved.  Workup for his chest pain overall was unremarkable.  Does not suggest ACS, PE or dissection.  Suspect this is likely reflux as he is having chest pain while lying flat after meals and seems to be relieved in the morning when he wakes up and is intermittent.  However he does have risk factors for ACS advised him to follow-up with his cardiologist.  Myles  him to continue taking the pantoprazole  as he was prescribed today by his PCP.  Discussed return precautions.  Discharged good condition.  Patient management required discussion with the following services or consulting groups:  None  Complexity of Problems Addressed Acute complicated illness or Injury  Additional Data Reviewed and Analyzed Further history obtained from: Past medical history and medications listed in the EMR and Prior ED visit notes  Patient Encounter Risk Assessment Consideration of hospitalization      Final diagnoses:  Chest pain, unspecified type    ED Discharge Orders     None          Debborah Alonge K, PA-C 11/16/23 0730

## 2023-11-11 LAB — TROPONIN I (HIGH SENSITIVITY): Troponin I (High Sensitivity): 8 ng/L (ref <18)

## 2023-11-11 LAB — D-DIMER, QUANTITATIVE: D-Dimer, Quant: 0.3 ug{FEU}/mL (ref 0.00–0.50)

## 2023-11-11 NOTE — Discharge Instructions (Addendum)
 Evaluation today was overall reassuring.  Please follow-up with your cardiologist given your chest pain but I do suspect this is likely reflux.  Advised that you continue taking the pantoprazole  as prescribed to you by your PCP.  If you develop chest pain, shortness of breath or any other concerning symptoms please return to the ED for further evaluation.

## 2023-11-18 ENCOUNTER — Other Ambulatory Visit (HOSPITAL_COMMUNITY): Payer: Self-pay | Admitting: Internal Medicine

## 2023-12-01 ENCOUNTER — Other Ambulatory Visit: Payer: Self-pay | Admitting: Adult Health

## 2023-12-01 DIAGNOSIS — I10 Essential (primary) hypertension: Secondary | ICD-10-CM

## 2023-12-20 ENCOUNTER — Encounter: Payer: Self-pay | Admitting: Adult Health

## 2023-12-20 ENCOUNTER — Ambulatory Visit: Admitting: Adult Health

## 2023-12-20 VITALS — BP 110/80 | HR 98 | Temp 98.1°F | Ht 70.0 in | Wt 231.0 lb

## 2023-12-20 DIAGNOSIS — K219 Gastro-esophageal reflux disease without esophagitis: Secondary | ICD-10-CM | POA: Diagnosis not present

## 2023-12-20 DIAGNOSIS — G8929 Other chronic pain: Secondary | ICD-10-CM | POA: Diagnosis not present

## 2023-12-20 DIAGNOSIS — M25562 Pain in left knee: Secondary | ICD-10-CM | POA: Diagnosis not present

## 2023-12-20 MED ORDER — METHYLPREDNISOLONE ACETATE 80 MG/ML IJ SUSP
80.0000 mg | Freq: Once | INTRAMUSCULAR | Status: AC
  Administered 2023-12-20: 80 mg via INTRAMUSCULAR

## 2023-12-20 MED ORDER — PANTOPRAZOLE SODIUM 40 MG PO TBEC: 40.0000 mg | DELAYED_RELEASE_TABLET | Freq: Every day | ORAL | 3 refills | Status: AC

## 2023-12-20 NOTE — Progress Notes (Signed)
 Subjective:    Patient ID: Caleb Garcia, male    DOB: Apr 20, 1965, 58 y.o.   MRN: [id number]  HPI 58 year old male who  has a past medical history of Arthritis, Cardiomyopathy, CHF (congestive heart failure) (HCC), Diabetes mellitus without complication (HCC), HTN (hypertension), Hyperlipidemia, Hypokalemia, Obesity, OSA (obstructive sleep apnea), Sleep apnea, Stroke (HCC) (10/2008), and Systolic CHF (HCC).  He presents to the office today for chronic left knee pain. He has known tricompartmental arthritis. He has not been able to get over to see orthopedics about it. Pain has been slowly progressing and he wants to know if he can have a steroid injection to help until he can be seen by Orthopedics. He has responded well to steroid injections in the past, last was greater than a year.    He also needs a refill of Protonix  for GERD.    Review of Systems See HPI   Past Medical History:  Diagnosis Date   Arthritis    Cardiomyopathy    Non-ischemic   CHF (congestive heart failure) (HCC)    Diabetes mellitus without complication (HCC)    HTN (hypertension)    Hyperlipidemia    Hypokalemia    Obesity    OSA (obstructive sleep apnea)    Sleep apnea    had surgery- no cpap   Stroke (HCC) 10/2008   Systolic CHF (HCC)     Social History   Socioeconomic History   Marital status: Married    Spouse name: Not on file   Number of children: Not on file   Years of education: Not on file   Highest education level: Not on file  Occupational History    Employer: UNEMPLOYED  Tobacco Use   Smoking status: Some Days    Types: Cigars   Smokeless tobacco: Never  Vaping Use   Vaping status: Never Used  Substance and Sexual Activity   Alcohol use: No   Drug use: No   Sexual activity: Not on file  Other Topics Concern   Not on file  Social History Narrative   Married 6 years   2 biologic daughters   2 stepsons, one Programme researcher, broadcasting/film/video for [employer]    Social Drivers of Health    Financial Resource Strain: Not on file  Food Insecurity: No Food Insecurity (03/01/2022)   Hunger Vital Sign    Worried About Running Out of Food in the Last Year: Never true    Ran Out of Food in the Last Year: Never true  Transportation Needs: No Transportation Needs (03/08/2022)   PRAPARE - Administrator, Civil Service (Medical): No    Lack of Transportation (Non-Medical): No  Physical Activity: Not on file  Stress: Not on file  Social Connections: Not on file  Intimate Partner Violence: Not on file    Past Surgical History:  Procedure Laterality Date   avulsion fracture of left hip     CARDIAC CATHETERIZATION  10/08/08   LEFT HEART CATH AND CORONARY ANGIOGRAPHY N/A 08/03/2016   Procedure: Left Heart Cath and Coronary Angiography;  Surgeon: Toribio JONELLE Fuel, MD;  Location: [facility] INVASIVE CV LAB;  Service: Cardiovascular;  Laterality: N/A;   TONSILLECTOMY     TOTAL KNEE ARTHROPLASTY Right 04/21/2021   Procedure: RIGHT TOTAL KNEE ARTHROPLASTY;  Surgeon: Addie Cordella Hamilton, MD;  Location: [facility] OR;  Service: Orthopedics;  Laterality: Right;   UVULOPALATOPHARYNGOPLASTY      Family History  Problem Relation Age of Onset  Diabetes Mother    Hypertension Mother    Heart disease Mother    Cancer Father        Throat cancer   Stroke Father    Stroke Brother 86   Prostate cancer Neg Hx    Colon cancer Neg Hx    Colon polyps Neg Hx    Esophageal cancer Neg Hx    Rectal cancer Neg Hx    Stomach cancer Neg Hx     Allergies  Allergen Reactions   Other     Almonds-caused swelling in hands   Shrimp [Shellfish Allergy] Swelling    Current Outpatient Medications on File Prior to Visit  Medication Sig Dispense Refill   acetaminophen -codeine  (TYLENOL  #3) 300-30 MG tablet Take 1 tablet by mouth every 6 (six) hours as needed for moderate pain. 30 tablet 0   aspirin  81 MG tablet Take 81 mg by mouth daily.       atorvastatin  (LIPITOR) 20 MG tablet Take 1 tablet (20 mg total)  by mouth daily. 90 tablet 3   carvedilol  (COREG ) 25 MG tablet Take 1 tablet (25 mg total) by mouth 2 (two) times daily with a meal. 180 tablet 3   Chlorpheniramine-DM (CORICIDIN HBP COUGH/COLD PO) Take 1 tablet by mouth every 12 (twelve) hours as needed (cold symptoms).     empagliflozin  (JARDIANCE ) 10 MG TABS tablet TAKE 1 TABLET BY MOUTH ONCE DAILY BEFORE BREAKFAST 30 tablet 3   felodipine  (PLENDIL ) 2.5 MG 24 hr tablet Take 1 tablet (2.5 mg total) by mouth daily. 90 tablet 3   hydrALAZINE  (APRESOLINE ) 50 MG tablet TAKE 1 TABLET BY MOUTH THREE TIMES DAILY 90 tablet 0   lisinopril  (ZESTRIL ) 40 MG tablet Take 1 tablet (40 mg total) by mouth daily. 90 tablet 3   metFORMIN  (GLUCOPHAGE ) 500 MG tablet Take 1 tablet (500 mg total) by mouth 2 (two) times daily with a meal. 180 tablet 0   pantoprazole  (PROTONIX ) 40 MG tablet Take 40 mg by mouth daily.     potassium chloride  SA (KLOR-CON  M) 20 MEQ tablet Take 1 tablet (20 mEq total) by mouth as needed. 30 tablet 6   sildenafil  (VIAGRA ) 100 MG tablet Take 0.5-1 tablets (50-100 mg total) by mouth daily as needed for erectile dysfunction. 5 tablet 11   spironolactone  (ALDACTONE ) 25 MG tablet Take 2 tablets (50 mg total) by mouth daily. 180 tablet 3   tirzepatide  (MOUNJARO ) 10 MG/0.5ML Pen Inject 10 mg into the skin once a week. 6 mL 0   No current facility-administered medications on file prior to visit.    BP 110/80   Pulse 98   Temp 98.1 F (36.7 C) (Oral)   Ht 5' 10 (1.778 m)   Wt 231 lb (104.8 kg)   SpO2 97%   BMI 33.15 kg/m       Objective:   Physical Exam Vitals and nursing note reviewed.  Constitutional:      Appearance: Normal appearance.  Cardiovascular:     Rate and Rhythm: Normal rate and regular rhythm.     Pulses: Normal pulses.     Heart sounds: Normal heart sounds.  Pulmonary:     Effort: Pulmonary effort is normal.     Breath sounds: Normal breath sounds.  Musculoskeletal:        General: Tenderness present. Normal  range of motion.  Skin:    General: Skin is warm and dry.  Neurological:     General: No focal deficit present.  Mental Status: He is alert and oriented to person, place, and time.  Psychiatric:        Mood and Affect: Mood normal.        Behavior: Behavior normal.        Thought Content: Thought content normal.        Judgment: Judgment normal.        Assessment & Plan:   1. Chronic pain of left knee (Primary) Discussed risks and benefits of corticosteroid injection and patient consented.  After prepping skin with betadine , injected 80 mg depomedrol and 2 cc of plain xylocaine  with 22 gauge one and one half inch needle using anterolateral approach and pt tolerated well.  - methylPREDNISolone  acetate (DEPO-MEDROL ) injection 80 mg  2. Gastroesophageal reflux disease without esophagitis  - pantoprazole  (PROTONIX ) 40 MG tablet; Take 1 tablet (40 mg total) by mouth daily.  Dispense: 90 tablet; Refill: 3  Idalia Allbritton, NP

## 2024-01-15 ENCOUNTER — Other Ambulatory Visit: Payer: Self-pay | Admitting: Adult Health

## 2024-01-15 DIAGNOSIS — E66811 Obesity, class 1: Secondary | ICD-10-CM

## 2024-01-15 DIAGNOSIS — Z6834 Body mass index (BMI) 34.0-34.9, adult: Secondary | ICD-10-CM

## 2024-01-15 DIAGNOSIS — Z7985 Long-term (current) use of injectable non-insulin antidiabetic drugs: Secondary | ICD-10-CM

## 2024-01-20 ENCOUNTER — Ambulatory Visit: Admitting: Adult Health

## 2024-01-24 ENCOUNTER — Ambulatory Visit: Admitting: Adult Health

## 2024-01-24 NOTE — Progress Notes (Deleted)
 Subjective:    Patient ID: Caleb Garcia, male    DOB: 06-27-1965, 58 y.o.   MRN: [id number]  HPI 58 year old male who  has a past medical history of Arthritis, Cardiomyopathy, CHF (congestive heart failure) (HCC), Diabetes mellitus without complication (HCC), HTN (hypertension), Hyperlipidemia, Hypokalemia, Obesity, OSA (obstructive sleep apnea), Sleep apnea, Stroke (HCC) (10/2008), and Systolic CHF (HCC).  He presents to the office today for follow up regarding DM/Hypertension/Obesity  Diabetes mellitus type 2-managed with metformin  500 mg daily, Jardiance  10 mg daily ( prescribed by Cardiology for CHF) and we have been titrating him up on Mounjaro  ( started in May 2025), he is currently at 10.5 mg dose. He has been tolerating this medication well and has not had any side effects such as nausea, vomiting or constipation. He has greatly reduced his portion size. He has started to notice his clothes fitting looser. He is eating healthier and has quit smoking cigars. He has not started exercise. Denies hypoglycemic events.  Lab Results  Component Value Date   HGBA1C 6.1 (A) 09/06/2023   HGBA1C 6.6 (A) 05/17/2023   HGBA1C 7.2 (A) 07/28/2022   Wt Readings from Last 10 Encounters:  12/20/23 231 lb (104.8 kg)  11/10/23 241 lb 2.9 oz (109.4 kg)  10/27/23 241 lb 3.2 oz (109.4 kg)  09/06/23 244 lb (110.7 kg)  08/09/23 247 lb 12.8 oz (112.4 kg)  07/15/23 250 lb (113.4 kg)  07/02/23 235 lb 0.2 oz (106.6 kg)  06/29/23 235 lb (106.6 kg)  05/17/23 256 lb (116.1 kg)  01/10/23 251 lb 9.6 oz (114.1 kg)  ]  HTN -managed with lisinopril  40 mg daily, hydralazine  25 mg 3 times daily, Plendil  2.5 mg daily, Spirolactone 25 mg daily, and Coreg  25 mg daily.  He denies dizziness, lightheadedness, chest pain, or shortness of breath. Cardiology is trying to get Entresto  approved for him.  BP Readings from Last 3 Encounters:  12/20/23 110/80  11/11/23 (!) 136/92  10/27/23 (!) 136/90     Review of  Systems See HPI   Past Medical History:  Diagnosis Date   Arthritis    Cardiomyopathy    Non-ischemic   CHF (congestive heart failure) (HCC)    Diabetes mellitus without complication (HCC)    HTN (hypertension)    Hyperlipidemia    Hypokalemia    Obesity    OSA (obstructive sleep apnea)    Sleep apnea    had surgery- no cpap   Stroke (HCC) 10/2008   Systolic CHF (HCC)     Social History   Socioeconomic History   Marital status: Married    Spouse name: Not on file   Number of children: Not on file   Years of education: Not on file   Highest education level: Not on file  Occupational History    Employer: UNEMPLOYED  Tobacco Use   Smoking status: Some Days    Types: Cigars   Smokeless tobacco: Never  Vaping Use   Vaping status: Never Used  Substance and Sexual Activity   Alcohol use: No   Drug use: No   Sexual activity: Not on file  Other Topics Concern   Not on file  Social History Narrative   Married 6 years   2 biologic daughters   2 stepsons, one Programme researcher, broadcasting/film/video for [employer]    Social Drivers of Health   Financial Resource Strain: Not on file  Food Insecurity: No Food Insecurity (03/01/2022)   Hunger Vital Sign  Worried About Programme researcher, broadcasting/film/video in the Last Year: Never true    Ran Out of Food in the Last Year: Never true  Transportation Needs: No Transportation Needs (03/08/2022)   PRAPARE - Administrator, Civil Service (Medical): No    Lack of Transportation (Non-Medical): No  Physical Activity: Not on file  Stress: Not on file  Social Connections: Not on file  Intimate Partner Violence: Not on file    Past Surgical History:  Procedure Laterality Date   avulsion fracture of left hip     CARDIAC CATHETERIZATION  10/08/08   LEFT HEART CATH AND CORONARY ANGIOGRAPHY N/A 08/03/2016   Procedure: Left Heart Cath and Coronary Angiography;  Surgeon: Toribio JONELLE Fuel, MD;  Location: [facility] INVASIVE CV LAB;  Service: Cardiovascular;   Laterality: N/A;   TONSILLECTOMY     TOTAL KNEE ARTHROPLASTY Right 04/21/2021   Procedure: RIGHT TOTAL KNEE ARTHROPLASTY;  Surgeon: Addie Cordella Hamilton, MD;  Location: [facility] OR;  Service: Orthopedics;  Laterality: Right;   UVULOPALATOPHARYNGOPLASTY      Family History  Problem Relation Age of Onset   Diabetes Mother    Hypertension Mother    Heart disease Mother    Cancer Father        Throat cancer   Stroke Father    Stroke Brother 29   Prostate cancer Neg Hx    Colon cancer Neg Hx    Colon polyps Neg Hx    Esophageal cancer Neg Hx    Rectal cancer Neg Hx    Stomach cancer Neg Hx     Allergies  Allergen Reactions   Other     Almonds-caused swelling in hands   Shrimp [Shellfish Allergy] Swelling    Current Outpatient Medications on File Prior to Visit  Medication Sig Dispense Refill   acetaminophen -codeine  (TYLENOL  #3) 300-30 MG tablet Take 1 tablet by mouth every 6 (six) hours as needed for moderate pain. 30 tablet 0   aspirin  81 MG tablet Take 81 mg by mouth daily.       atorvastatin  (LIPITOR) 20 MG tablet Take 1 tablet (20 mg total) by mouth daily. 90 tablet 3   carvedilol  (COREG ) 25 MG tablet Take 1 tablet (25 mg total) by mouth 2 (two) times daily with a meal. 180 tablet 3   Chlorpheniramine-DM (CORICIDIN HBP COUGH/COLD PO) Take 1 tablet by mouth every 12 (twelve) hours as needed (cold symptoms).     empagliflozin  (JARDIANCE ) 10 MG TABS tablet TAKE 1 TABLET BY MOUTH ONCE DAILY BEFORE BREAKFAST 30 tablet 3   felodipine  (PLENDIL ) 2.5 MG 24 hr tablet Take 1 tablet (2.5 mg total) by mouth daily. 90 tablet 3   hydrALAZINE  (APRESOLINE ) 50 MG tablet TAKE 1 TABLET BY MOUTH THREE TIMES DAILY 90 tablet 0   lisinopril  (ZESTRIL ) 40 MG tablet Take 1 tablet (40 mg total) by mouth daily. 90 tablet 3   metFORMIN  (GLUCOPHAGE ) 500 MG tablet Take 1 tablet (500 mg total) by mouth 2 (two) times daily with a meal. 180 tablet 0   pantoprazole  (PROTONIX ) 40 MG tablet Take 1 tablet (40 mg total)  by mouth daily. 90 tablet 3   potassium chloride  SA (KLOR-CON  M) 20 MEQ tablet Take 1 tablet (20 mEq total) by mouth as needed. 30 tablet 6   sildenafil  (VIAGRA ) 100 MG tablet Take 0.5-1 tablets (50-100 mg total) by mouth daily as needed for erectile dysfunction. 5 tablet 11   spironolactone  (ALDACTONE ) 25 MG tablet Take 2 tablets (50  mg total) by mouth daily. 180 tablet 3   tirzepatide  (MOUNJARO ) 10 MG/0.5ML Pen Inject 10 mg into the skin once a week. 6 mL 0   No current facility-administered medications on file prior to visit.    There were no vitals taken for this visit.      Objective:   Physical Exam Vitals and nursing note reviewed.  Constitutional:      Appearance: Normal appearance. He is obese.  Cardiovascular:     Rate and Rhythm: Normal rate and regular rhythm.     Pulses: Normal pulses.     Heart sounds: Normal heart sounds.  Pulmonary:     Effort: Pulmonary effort is normal.     Breath sounds: Normal breath sounds.  Skin:    General: Skin is warm and dry.  Neurological:     General: No focal deficit present.     Mental Status: He is alert and oriented to person, place, and time.  Psychiatric:        Mood and Affect: Mood normal.        Behavior: Behavior normal.        Thought Content: Thought content normal.        Judgment: Judgment normal.           Assessment & Plan:

## 2024-01-25 ENCOUNTER — Ambulatory Visit: Admitting: Adult Health

## 2024-01-25 ENCOUNTER — Encounter: Payer: Self-pay | Admitting: Adult Health

## 2024-01-25 VITALS — BP 122/88 | HR 92 | Temp 98.1°F | Ht 70.0 in | Wt 228.0 lb

## 2024-01-25 DIAGNOSIS — E66811 Obesity, class 1: Secondary | ICD-10-CM

## 2024-01-25 DIAGNOSIS — Z7985 Long-term (current) use of injectable non-insulin antidiabetic drugs: Secondary | ICD-10-CM

## 2024-01-25 DIAGNOSIS — Z7984 Long term (current) use of oral hypoglycemic drugs: Secondary | ICD-10-CM

## 2024-01-25 DIAGNOSIS — Z6832 Body mass index (BMI) 32.0-32.9, adult: Secondary | ICD-10-CM

## 2024-01-25 DIAGNOSIS — E119 Type 2 diabetes mellitus without complications: Secondary | ICD-10-CM | POA: Diagnosis not present

## 2024-01-25 DIAGNOSIS — I10 Essential (primary) hypertension: Secondary | ICD-10-CM | POA: Diagnosis not present

## 2024-01-25 LAB — BASIC METABOLIC PANEL WITH GFR
BUN: 9 mg/dL (ref 6–23)
CO2: 24 meq/L (ref 19–32)
Calcium: 9.4 mg/dL (ref 8.4–10.5)
Chloride: 104 meq/L (ref 96–112)
Creatinine, Ser: 0.83 mg/dL (ref 0.40–1.50)
GFR: 96.64 mL/min (ref >60.00)
Glucose, Bld: 105 mg/dL — ABNORMAL HIGH (ref 70–99)
Potassium: 3.3 meq/L — ABNORMAL LOW (ref 3.5–5.1)
Sodium: 138 meq/L (ref 135–145)

## 2024-01-25 LAB — POCT GLYCOSYLATED HEMOGLOBIN (HGB A1C): Hemoglobin A1C: 5.8 % — AB (ref 4.0–5.6)

## 2024-01-25 MED ORDER — TIRZEPATIDE 10 MG/0.5ML ~~LOC~~ SOAJ: 10.0000 mg | SUBCUTANEOUS | 0 refills | Status: DC

## 2024-01-25 NOTE — Progress Notes (Signed)
 Subjective:    Patient ID: Caleb Garcia, male    DOB: 1965-10-21, 58 y.o.   MRN: [id number]  HPI He presents to the office today for follow up regarding DM/Hypertension/Obesity  Diabetes mellitus type 2-managed with metformin  500 mg daily, Jardiance  10 mg daily ( prescribed by Cardiology for CHF) and we have been titrating him up on Mounjaro  ( started in May 2025), he is currently at 10 mg dose. He has been tolerating this medication well and has not had any side effects such as nausea, vomiting or constipation. He has greatly reduced his portion size. He has started to notice his clothes fitting looser. He is eating healthier and has quit smoking cigars. He has not started exercise. Denies hypoglycemic events.   Lab Results  Component Value Date   HGBA1C 6.1 (A) 09/06/2023   HGBA1C 6.6 (A) 05/17/2023   HGBA1C 7.2 (A) 07/28/2022   Wt Readings from Last 10 Encounters:  01/25/24 228 lb (103.4 kg)  12/20/23 231 lb (104.8 kg)  11/10/23 241 lb 2.9 oz (109.4 kg)  10/27/23 241 lb 3.2 oz (109.4 kg)  09/06/23 244 lb (110.7 kg)  08/09/23 247 lb 12.8 oz (112.4 kg)  07/15/23 250 lb (113.4 kg)  07/02/23 235 lb 0.2 oz (106.6 kg)  06/29/23 235 lb (106.6 kg)  05/17/23 256 lb (116.1 kg)    HTN -managed with lisinopril  40 mg daily, hydralazine  25 mg 3 times daily, Plendil  2.5 mg daily, Spirolactone 25 mg daily, and Coreg  25 mg daily.  He denies dizziness, lightheadedness, chest pain, or shortness of breath. Cardiology is trying to get Entresto  approved for him.  BP Readings from Last 3 Encounters:  01/25/24 122/88  12/20/23 110/80  11/11/23 (!) 136/92   He is also wondering if he needs to be taking potassium. He last had this ordered over a year ago and does not remember the last time he took it. He has not had any symptoms.   Review of Systems See HPI   Past Medical History:  Diagnosis Date   Arthritis    Cardiomyopathy    Non-ischemic   CHF (congestive heart failure) (HCC)     Diabetes mellitus without complication (HCC)    HTN (hypertension)    Hyperlipidemia    Hypokalemia    Obesity    OSA (obstructive sleep apnea)    Sleep apnea    had surgery- no cpap   Stroke (HCC) 10/2008   Systolic CHF (HCC)     Social History   Socioeconomic History   Marital status: Married    Spouse name: Not on file   Number of children: Not on file   Years of education: Not on file   Highest education level: Not on file  Occupational History    Employer: UNEMPLOYED  Tobacco Use   Smoking status: Some Days    Types: Cigars   Smokeless tobacco: Never  Vaping Use   Vaping status: Never Used  Substance and Sexual Activity   Alcohol use: No   Drug use: No   Sexual activity: Not on file  Other Topics Concern   Not on file  Social History Narrative   Married 6 years   2 biologic daughters   2 stepsons, one Programme researcher, broadcasting/film/video for [employer]    Social Drivers of Health   Financial Resource Strain: Not on file  Food Insecurity: No Food Insecurity (03/01/2022)   Hunger Vital Sign    Worried About Running Out of  Food in the Last Year: Never true    Ran Out of Food in the Last Year: Never true  Transportation Needs: No Transportation Needs (03/08/2022)   PRAPARE - Administrator, Civil Service (Medical): No    Lack of Transportation (Non-Medical): No  Physical Activity: Not on file  Stress: Not on file  Social Connections: Not on file  Intimate Partner Violence: Not on file    Past Surgical History:  Procedure Laterality Date   avulsion fracture of left hip     CARDIAC CATHETERIZATION  10/08/08   LEFT HEART CATH AND CORONARY ANGIOGRAPHY N/A 08/03/2016   Procedure: Left Heart Cath and Coronary Angiography;  Surgeon: Toribio JONELLE Fuel, MD;  Location: [facility] INVASIVE CV LAB;  Service: Cardiovascular;  Laterality: N/A;   TONSILLECTOMY     TOTAL KNEE ARTHROPLASTY Right 04/21/2021   Procedure: RIGHT TOTAL KNEE ARTHROPLASTY;  Surgeon: Addie Cordella Hamilton, MD;   Location: [facility] OR;  Service: Orthopedics;  Laterality: Right;   UVULOPALATOPHARYNGOPLASTY      Family History  Problem Relation Age of Onset   Diabetes Mother    Hypertension Mother    Heart disease Mother    Cancer Father        Throat cancer   Stroke Father    Stroke Brother 69   Prostate cancer Neg Hx    Colon cancer Neg Hx    Colon polyps Neg Hx    Esophageal cancer Neg Hx    Rectal cancer Neg Hx    Stomach cancer Neg Hx     Allergies  Allergen Reactions   Other     Almonds-caused swelling in hands   Shrimp [Shellfish Allergy] Swelling    Current Outpatient Medications on File Prior to Visit  Medication Sig Dispense Refill   acetaminophen -codeine  (TYLENOL  #3) 300-30 MG tablet Take 1 tablet by mouth every 6 (six) hours as needed for moderate pain. 30 tablet 0   aspirin  81 MG tablet Take 81 mg by mouth daily.       atorvastatin  (LIPITOR) 20 MG tablet Take 1 tablet (20 mg total) by mouth daily. 90 tablet 3   carvedilol  (COREG ) 25 MG tablet Take 1 tablet (25 mg total) by mouth 2 (two) times daily with a meal. 180 tablet 3   Chlorpheniramine-DM (CORICIDIN HBP COUGH/COLD PO) Take 1 tablet by mouth every 12 (twelve) hours as needed (cold symptoms).     empagliflozin  (JARDIANCE ) 10 MG TABS tablet TAKE 1 TABLET BY MOUTH ONCE DAILY BEFORE BREAKFAST 30 tablet 3   felodipine  (PLENDIL ) 2.5 MG 24 hr tablet Take 1 tablet (2.5 mg total) by mouth daily. 90 tablet 3   hydrALAZINE  (APRESOLINE ) 50 MG tablet TAKE 1 TABLET BY MOUTH THREE TIMES DAILY 90 tablet 0   lisinopril  (ZESTRIL ) 40 MG tablet Take 1 tablet (40 mg total) by mouth daily. 90 tablet 3   metFORMIN  (GLUCOPHAGE ) 500 MG tablet Take 1 tablet (500 mg total) by mouth 2 (two) times daily with a meal. 180 tablet 0   pantoprazole  (PROTONIX ) 40 MG tablet Take 1 tablet (40 mg total) by mouth daily. 90 tablet 3   potassium chloride  SA (KLOR-CON  M) 20 MEQ tablet Take 1 tablet (20 mEq total) by mouth as needed. 30 tablet 6   sildenafil   (VIAGRA ) 100 MG tablet Take 0.5-1 tablets (50-100 mg total) by mouth daily as needed for erectile dysfunction. 5 tablet 11   spironolactone  (ALDACTONE ) 25 MG tablet Take 2 tablets (50 mg total) by mouth daily.  180 tablet 3   tirzepatide  (MOUNJARO ) 10 MG/0.5ML Pen Inject 10 mg into the skin once a week. 6 mL 0   No current facility-administered medications on file prior to visit.    BP 122/88   Pulse 92   Temp 98.1 F (36.7 C) (Oral)   Ht 5' 10 (1.778 m)   Wt 228 lb (103.4 kg)   SpO2 99%   BMI 32.71 kg/m       Objective:   Physical Exam Vitals reviewed.  Constitutional:      Appearance: Normal appearance.  Cardiovascular:     Rate and Rhythm: Normal rate and regular rhythm.     Heart sounds: Normal heart sounds.  Pulmonary:     Effort: Pulmonary effort is normal.     Breath sounds: Normal breath sounds.  Musculoskeletal:        General: Normal range of motion.  Skin:    General: Skin is warm and dry.     Capillary Refill: Capillary refill takes less than 2 seconds.  Neurological:     General: No focal deficit present.     Mental Status: He is alert and oriented to person, place, and time.  Psychiatric:        Mood and Affect: Mood normal.        Behavior: Behavior normal.        Thought Content: Thought content normal.        Judgment: Judgment normal.        Assessment & Plan:  1. Diabetes mellitus treated with oral medication (HCC) (Primary)  - POC HgB A1c- 5.8  - I will have him stop Metformin  completely.  - Continue with Jardiance .   2. Long-term current use of injectable noninsulin antidiabetic medication - Will keep him on Mounjaro  10 mg weekly as he is losing weight on this.  - Follow up in 3 months  - tirzepatide  (MOUNJARO ) 10 MG/0.5ML Pen; Inject 10 mg into the skin once a week.  Dispense: 6 mL; Refill: 0  3. Essential hypertension - BP at goal  - Continue to monitor with additional weight loss  - Will check BMP today to see if he needs to be  taking potassium.  - Basic Metabolic Panel; Future  4. Class 1 obesity  - tirzepatide  (MOUNJARO ) 10 MG/0.5ML Pen; Inject 10 mg into the skin once a week.  Dispense: 6 mL; Refill: 0  5. BMI 32.0-32.9,adult  - tirzepatide  (MOUNJARO ) 10 MG/0.5ML Pen; Inject 10 mg into the skin once a week.  Dispense: 6 mL; Refill: 0  Darleene Shape, NP

## 2024-01-26 ENCOUNTER — Ambulatory Visit: Payer: Self-pay | Admitting: Adult Health

## 2024-01-26 ENCOUNTER — Other Ambulatory Visit: Payer: Self-pay | Admitting: Adult Health

## 2024-01-26 MED ORDER — POTASSIUM CHLORIDE CRYS ER 20 MEQ PO TBCR: 20.0000 meq | EXTENDED_RELEASE_TABLET | ORAL | 3 refills | Status: AC | PRN

## 2024-03-09 ENCOUNTER — Encounter: Payer: Self-pay | Admitting: Adult Health

## 2024-03-09 ENCOUNTER — Ambulatory Visit: Admitting: Adult Health

## 2024-03-09 VITALS — BP 120/70 | HR 97 | Temp 98.8°F | Ht 70.0 in | Wt 228.0 lb

## 2024-03-09 DIAGNOSIS — E11621 Type 2 diabetes mellitus with foot ulcer: Secondary | ICD-10-CM

## 2024-03-09 DIAGNOSIS — B351 Tinea unguium: Secondary | ICD-10-CM

## 2024-03-09 MED ORDER — DOXYCYCLINE HYCLATE 100 MG PO CAPS: 100.0000 mg | ORAL_CAPSULE | Freq: Two times a day (BID) | ORAL | 0 refills | Status: AC

## 2024-03-09 NOTE — Progress Notes (Signed)
 Subjective:    Patient ID: Caleb Garcia, male    DOB: [birth year], 58 y.o.   MRN: [id number]  HPI 58 year old male who  has a past medical history of Arthritis, Cardiomyopathy, CHF (congestive heart failure) (HCC), Diabetes mellitus without complication (HCC), HTN (hypertension), Hyperlipidemia, Hypokalemia, Obesity, OSA (obstructive sleep apnea), Sleep apnea, Stroke (HCC) (10/2008), and Systolic CHF (HCC).  He is being seen today for an acute issue. He reports that about 3 weeks ago he developed a sore on the inside of his 4th toe on his right foot. At home he has been using hydro peroxide and betadine  but has not noticed any improvement in hs symptoms. He is a diabetic and has very long thick toenails    Review of Systems See HPI   Past Medical History:  Diagnosis Date   Arthritis    Cardiomyopathy    Non-ischemic   CHF (congestive heart failure) (HCC)    Diabetes mellitus without complication (HCC)    HTN (hypertension)    Hyperlipidemia    Hypokalemia    Obesity    OSA (obstructive sleep apnea)    Sleep apnea    had surgery- no cpap   Stroke (HCC) 10/2008   Systolic CHF (HCC)     Social History   Socioeconomic History   Marital status: Married    Spouse name: Not on file   Number of children: Not on file   Years of education: Not on file   Highest education level: Not on file  Occupational History    Employer: UNEMPLOYED  Tobacco Use   Smoking status: Some Days    Types: Cigars   Smokeless tobacco: Never  Vaping Use   Vaping status: Never Used  Substance and Sexual Activity   Alcohol use: No   Drug use: No   Sexual activity: Not on file  Other Topics Concern   Not on file  Social History Narrative   Married 6 years   2 biologic daughters   2 stepsons, one Programme Researcher, Broadcasting/film/video for [employer]    Social Drivers of Health   Financial Resource Strain: Not on file  Food Insecurity: No Food Insecurity (03/01/2022)   Hunger Vital Sign    Worried About  Running Out of Food in the Last Year: Never true    Ran Out of Food in the Last Year: Never true  Transportation Needs: No Transportation Needs (03/08/2022)   PRAPARE - Administrator, Civil Service (Medical): No    Lack of Transportation (Non-Medical): No  Physical Activity: Not on file  Stress: Not on file  Social Connections: Not on file  Intimate Partner Violence: Not on file    Past Surgical History:  Procedure Laterality Date   avulsion fracture of left hip     CARDIAC CATHETERIZATION  10/08/08   LEFT HEART CATH AND CORONARY ANGIOGRAPHY N/A 08/03/2016   Procedure: Left Heart Cath and Coronary Angiography;  Surgeon: Toribio JONELLE Fuel, MD;  Location: [facility] INVASIVE CV LAB;  Service: Cardiovascular;  Laterality: N/A;   TONSILLECTOMY     TOTAL KNEE ARTHROPLASTY Right 04/21/2021   Procedure: RIGHT TOTAL KNEE ARTHROPLASTY;  Surgeon: Addie Cordella Hamilton, MD;  Location: [facility] OR;  Service: Orthopedics;  Laterality: Right;   UVULOPALATOPHARYNGOPLASTY      Family History  Problem Relation Age of Onset   Diabetes Mother    Hypertension Mother    Heart disease Mother    Cancer Father  Throat cancer   Stroke Father    Stroke Brother 84   Prostate cancer Neg Hx    Colon cancer Neg Hx    Colon polyps Neg Hx    Esophageal cancer Neg Hx    Rectal cancer Neg Hx    Stomach cancer Neg Hx     Allergies  Allergen Reactions   Other     Almonds-caused swelling in hands   Shrimp [Shellfish Allergy] Swelling    Current Outpatient Medications on File Prior to Visit  Medication Sig Dispense Refill   acetaminophen -codeine  (TYLENOL  #3) 300-30 MG tablet Take 1 tablet by mouth every 6 (six) hours as needed for moderate pain. 30 tablet 0   aspirin  81 MG tablet Take 81 mg by mouth daily.       atorvastatin  (LIPITOR) 20 MG tablet Take 1 tablet (20 mg total) by mouth daily. 90 tablet 3   carvedilol  (COREG ) 25 MG tablet Take 1 tablet (25 mg total) by mouth 2 (two) times daily with a meal.  180 tablet 3   empagliflozin  (JARDIANCE ) 10 MG TABS tablet TAKE 1 TABLET BY MOUTH ONCE DAILY BEFORE BREAKFAST 30 tablet 3   felodipine  (PLENDIL ) 2.5 MG 24 hr tablet Take 1 tablet (2.5 mg total) by mouth daily. 90 tablet 3   hydrALAZINE  (APRESOLINE ) 50 MG tablet TAKE 1 TABLET BY MOUTH THREE TIMES DAILY 90 tablet 0   lisinopril  (ZESTRIL ) 40 MG tablet Take 1 tablet (40 mg total) by mouth daily. 90 tablet 3   pantoprazole  (PROTONIX ) 40 MG tablet Take 1 tablet (40 mg total) by mouth daily. 90 tablet 3   potassium chloride  SA (KLOR-CON  M) 20 MEQ tablet Take 1 tablet (20 mEq total) by mouth as needed. 90 tablet 3   sildenafil  (VIAGRA ) 100 MG tablet Take 0.5-1 tablets (50-100 mg total) by mouth daily as needed for erectile dysfunction. 5 tablet 11   spironolactone  (ALDACTONE ) 25 MG tablet Take 2 tablets (50 mg total) by mouth daily. 180 tablet 3   tirzepatide  (MOUNJARO ) 10 MG/0.5ML Pen Inject 10 mg into the skin once a week. 6 mL 0   No current facility-administered medications on file prior to visit.    BP 120/70   Pulse 97   Temp 98.8 F (37.1 C) (Oral)   Ht 5' 10 (1.778 m)   Wt 228 lb (103.4 kg)   SpO2 98%   BMI 32.71 kg/m       Objective:   Physical Exam Vitals and nursing note reviewed.  Constitutional:      Appearance: Normal appearance.  Musculoskeletal:        General: Normal range of motion.       Feet:  Feet:     Right foot:     Skin integrity: Ulcer and skin breakdown present.     Toenail Condition: Right toenails are abnormally thick. Fungal disease present. Skin:    General: Skin is warm and dry.     Capillary Refill: Capillary refill takes less than 2 seconds.  Neurological:     General: No focal deficit present.     Mental Status: He is alert and oriented to person, place, and time.  Psychiatric:        Mood and Affect: Mood normal.        Behavior: Behavior normal.        Thought Content: Thought content normal.        Judgment: Judgment normal.         Assessment & Plan:  1. Diabetic ulcer of toe of right foot associated with type 2 diabetes mellitus, limited to breakdown of skin (HCC) (Primary) - Will place on Doxycycline  to prevent infection and refer to podiatry. Will likely need the callous on the inside of his third toe removed in order for the ulcer to heal completely.  - Can stop using hydrogen peroxide.  - Apply bandage around ulcer  - Ambulatory referral to Podiatry - doxycycline  (VIBRAMYCIN ) 100 MG capsule; Take 1 capsule (100 mg total) by mouth 2 (two) times daily.  Dispense: 14 capsule; Refill: 0  2. Toenail fungus - Ambulatory referral to Podiatry  Darleene Shape, NP   I personally spent a total of 31 minutes in the care of the patient today including preparing to see the patient, getting/reviewing separately obtained history, performing a medically appropriate exam/evaluation, counseling and educating, placing orders, documenting clinical information in the EHR, and listening .

## 2024-03-12 ENCOUNTER — Ambulatory Visit

## 2024-03-19 ENCOUNTER — Ambulatory Visit: Admitting: Podiatry

## 2024-03-26 ENCOUNTER — Ambulatory Visit: Admitting: Podiatry

## 2024-04-18 ENCOUNTER — Other Ambulatory Visit: Payer: Self-pay | Admitting: Adult Health

## 2024-04-18 DIAGNOSIS — E66811 Obesity, class 1: Secondary | ICD-10-CM

## 2024-04-18 DIAGNOSIS — Z7985 Long-term (current) use of injectable non-insulin antidiabetic drugs: Secondary | ICD-10-CM

## 2024-04-18 DIAGNOSIS — Z6832 Body mass index (BMI) 32.0-32.9, adult: Secondary | ICD-10-CM

## 2024-04-22 ENCOUNTER — Other Ambulatory Visit (HOSPITAL_COMMUNITY): Payer: Self-pay | Admitting: Internal Medicine

## 2024-04-25 ENCOUNTER — Ambulatory Visit: Admitting: Adult Health

## 2024-04-26 ENCOUNTER — Ambulatory Visit (HOSPITAL_COMMUNITY): Admitting: Internal Medicine

## 2024-04-26 ENCOUNTER — Telehealth: Payer: Self-pay | Admitting: Adult Health

## 2024-04-26 NOTE — Telephone Encounter (Signed)
 Please advise.

## 2024-04-26 NOTE — Telephone Encounter (Signed)
 Patient has late canceled/no showed 4 appointments since 04/25/23 which qualifies for dismissal. Please advise, thanks

## 2024-05-02 ENCOUNTER — Ambulatory Visit: Admitting: Adult Health

## 2024-07-16 ENCOUNTER — Ambulatory Visit (HOSPITAL_COMMUNITY): Admitting: Internal Medicine
